# Patient Record
Sex: Female | Born: 1950
Health system: Southern US, Community
[De-identification: ages and names within clinical notes are randomized; demographics above are authoritative.]

## PROBLEM LIST (undated history)

## (undated) DIAGNOSIS — Z803 Family history of malignant neoplasm of breast: Secondary | ICD-10-CM

## (undated) DIAGNOSIS — A809 Acute poliomyelitis, unspecified: Secondary | ICD-10-CM

## (undated) DIAGNOSIS — I10 Essential (primary) hypertension: Secondary | ICD-10-CM

## (undated) DIAGNOSIS — C541 Malignant neoplasm of endometrium: Secondary | ICD-10-CM

## (undated) DIAGNOSIS — M1990 Unspecified osteoarthritis, unspecified site: Secondary | ICD-10-CM

## (undated) DIAGNOSIS — K219 Gastro-esophageal reflux disease without esophagitis: Secondary | ICD-10-CM

## (undated) DIAGNOSIS — G5601 Carpal tunnel syndrome, right upper limb: Secondary | ICD-10-CM

## (undated) DIAGNOSIS — Z800 Family history of malignant neoplasm of digestive organs: Secondary | ICD-10-CM

## (undated) DIAGNOSIS — N950 Postmenopausal bleeding: Secondary | ICD-10-CM

## (undated) DIAGNOSIS — E785 Hyperlipidemia, unspecified: Secondary | ICD-10-CM

## (undated) DIAGNOSIS — Z8051 Family history of malignant neoplasm of kidney: Secondary | ICD-10-CM

## (undated) HISTORY — PX: OTHER SURGICAL HISTORY: SHX169

## (undated) HISTORY — DX: Family history of malignant neoplasm of kidney: Z80.51

## (undated) HISTORY — PX: TOTAL HIP ARTHROPLASTY: SHX124

## (undated) HISTORY — DX: Family history of malignant neoplasm of digestive organs: Z80.0

## (undated) HISTORY — DX: Family history of malignant neoplasm of breast: Z80.3

## (undated) HISTORY — PX: COLONOSCOPY: SHX174

## (undated) HISTORY — PX: BREAST EXCISIONAL BIOPSY: SUR124

## (undated) HISTORY — DX: Acute poliomyelitis, unspecified: A80.9

## (undated) HISTORY — PX: BREAST SURGERY: SHX581

## (undated) HISTORY — PX: COLON SURGERY: SHX602

## (undated) HISTORY — PX: CARPAL TUNNEL RELEASE: SHX101

## (undated) HISTORY — DX: Hyperlipidemia, unspecified: E78.5

## (undated) HISTORY — PX: BREAST BIOPSY: SHX20

## (undated) HISTORY — DX: Gastro-esophageal reflux disease without esophagitis: K21.9

## (undated) HISTORY — DX: Essential (primary) hypertension: I10

---

## 1999-01-29 ENCOUNTER — Encounter: Payer: Self-pay | Admitting: Obstetrics and Gynecology

## 1999-01-29 ENCOUNTER — Encounter: Admission: RE | Admit: 1999-01-29 | Discharge: 1999-01-29 | Payer: Self-pay | Admitting: Obstetrics and Gynecology

## 1999-09-02 ENCOUNTER — Other Ambulatory Visit: Admission: RE | Admit: 1999-09-02 | Discharge: 1999-09-02 | Payer: Self-pay | Admitting: Obstetrics and Gynecology

## 1999-09-29 ENCOUNTER — Encounter: Payer: Self-pay | Admitting: Obstetrics and Gynecology

## 1999-09-29 ENCOUNTER — Encounter: Admission: RE | Admit: 1999-09-29 | Discharge: 1999-09-29 | Payer: Self-pay | Admitting: Obstetrics and Gynecology

## 2000-12-20 ENCOUNTER — Other Ambulatory Visit: Admission: RE | Admit: 2000-12-20 | Discharge: 2000-12-20 | Payer: Self-pay | Admitting: Obstetrics and Gynecology

## 2001-04-11 ENCOUNTER — Encounter: Payer: Self-pay | Admitting: Emergency Medicine

## 2001-04-12 ENCOUNTER — Encounter: Payer: Self-pay | Admitting: General Surgery

## 2001-04-12 ENCOUNTER — Inpatient Hospital Stay (HOSPITAL_COMMUNITY): Admission: EM | Admit: 2001-04-12 | Discharge: 2001-04-19 | Payer: Self-pay | Admitting: Emergency Medicine

## 2001-04-13 ENCOUNTER — Encounter: Payer: Self-pay | Admitting: Surgery

## 2001-04-16 ENCOUNTER — Encounter: Payer: Self-pay | Admitting: Surgery

## 2001-04-27 ENCOUNTER — Ambulatory Visit (HOSPITAL_COMMUNITY): Admission: RE | Admit: 2001-04-27 | Discharge: 2001-04-27 | Payer: Self-pay | Admitting: Surgery

## 2001-04-27 ENCOUNTER — Encounter: Payer: Self-pay | Admitting: Surgery

## 2001-09-04 ENCOUNTER — Encounter: Payer: Self-pay | Admitting: Surgery

## 2001-09-04 ENCOUNTER — Ambulatory Visit (HOSPITAL_COMMUNITY): Admission: RE | Admit: 2001-09-04 | Discharge: 2001-09-04 | Payer: Self-pay | Admitting: Surgery

## 2001-09-05 ENCOUNTER — Encounter: Payer: Self-pay | Admitting: Obstetrics and Gynecology

## 2001-09-05 ENCOUNTER — Encounter: Admission: RE | Admit: 2001-09-05 | Discharge: 2001-09-05 | Payer: Self-pay

## 2002-01-03 HISTORY — PX: BREAST CYST ASPIRATION: SHX578

## 2002-01-29 ENCOUNTER — Encounter: Admission: RE | Admit: 2002-01-29 | Discharge: 2002-01-29 | Payer: Self-pay | Admitting: Obstetrics and Gynecology

## 2002-01-29 ENCOUNTER — Encounter: Payer: Self-pay | Admitting: Obstetrics and Gynecology

## 2002-02-11 ENCOUNTER — Encounter: Payer: Self-pay | Admitting: Obstetrics and Gynecology

## 2002-02-11 ENCOUNTER — Encounter: Admission: RE | Admit: 2002-02-11 | Discharge: 2002-02-11 | Payer: Self-pay | Admitting: Obstetrics and Gynecology

## 2003-08-13 ENCOUNTER — Ambulatory Visit (HOSPITAL_COMMUNITY): Admission: RE | Admit: 2003-08-13 | Discharge: 2003-08-13 | Payer: Self-pay | Admitting: Allergy

## 2003-12-02 ENCOUNTER — Other Ambulatory Visit: Admission: RE | Admit: 2003-12-02 | Discharge: 2003-12-02 | Payer: Self-pay | Admitting: Obstetrics and Gynecology

## 2004-03-24 ENCOUNTER — Encounter: Admission: RE | Admit: 2004-03-24 | Discharge: 2004-03-24 | Payer: Self-pay | Admitting: Obstetrics and Gynecology

## 2005-03-30 ENCOUNTER — Encounter: Admission: RE | Admit: 2005-03-30 | Discharge: 2005-03-30 | Payer: Self-pay | Admitting: Obstetrics and Gynecology

## 2005-05-13 ENCOUNTER — Other Ambulatory Visit: Admission: RE | Admit: 2005-05-13 | Discharge: 2005-05-13 | Payer: Self-pay | Admitting: Obstetrics & Gynecology

## 2005-10-07 ENCOUNTER — Encounter: Admission: RE | Admit: 2005-10-07 | Discharge: 2005-10-07 | Payer: Self-pay | Admitting: Obstetrics and Gynecology

## 2006-04-28 ENCOUNTER — Encounter: Admission: RE | Admit: 2006-04-28 | Discharge: 2006-04-28 | Payer: Self-pay | Admitting: Obstetrics and Gynecology

## 2006-06-02 ENCOUNTER — Other Ambulatory Visit: Admission: RE | Admit: 2006-06-02 | Discharge: 2006-06-02 | Payer: Self-pay | Admitting: Obstetrics & Gynecology

## 2006-09-28 ENCOUNTER — Encounter: Payer: Self-pay | Admitting: Pulmonary Disease

## 2007-01-04 HISTORY — PX: WRIST SURGERY: SHX841

## 2007-01-09 ENCOUNTER — Inpatient Hospital Stay (HOSPITAL_COMMUNITY): Admission: RE | Admit: 2007-01-09 | Discharge: 2007-01-12 | Payer: Self-pay | Admitting: Orthopedic Surgery

## 2007-03-12 ENCOUNTER — Encounter: Payer: Self-pay | Admitting: Pulmonary Disease

## 2007-04-30 ENCOUNTER — Encounter: Admission: RE | Admit: 2007-04-30 | Discharge: 2007-04-30 | Payer: Self-pay | Admitting: Family Medicine

## 2007-07-04 ENCOUNTER — Encounter: Admission: RE | Admit: 2007-07-04 | Discharge: 2007-07-04 | Payer: Self-pay | Admitting: Orthopedic Surgery

## 2007-07-23 ENCOUNTER — Encounter: Payer: Self-pay | Admitting: Pulmonary Disease

## 2007-10-02 ENCOUNTER — Encounter: Payer: Self-pay | Admitting: Pulmonary Disease

## 2007-11-10 ENCOUNTER — Inpatient Hospital Stay (HOSPITAL_COMMUNITY): Admission: EM | Admit: 2007-11-10 | Discharge: 2007-11-13 | Payer: Self-pay | Admitting: Emergency Medicine

## 2008-01-25 ENCOUNTER — Encounter: Payer: Self-pay | Admitting: Pulmonary Disease

## 2008-01-28 ENCOUNTER — Encounter: Admission: RE | Admit: 2008-01-28 | Discharge: 2008-01-28 | Payer: Self-pay | Admitting: Allergy

## 2008-02-13 ENCOUNTER — Ambulatory Visit: Payer: Self-pay | Admitting: Pulmonary Disease

## 2008-02-13 DIAGNOSIS — E785 Hyperlipidemia, unspecified: Secondary | ICD-10-CM | POA: Insufficient documentation

## 2008-02-13 DIAGNOSIS — I10 Essential (primary) hypertension: Secondary | ICD-10-CM | POA: Insufficient documentation

## 2008-02-13 DIAGNOSIS — J309 Allergic rhinitis, unspecified: Secondary | ICD-10-CM | POA: Insufficient documentation

## 2008-02-13 DIAGNOSIS — J4540 Moderate persistent asthma, uncomplicated: Secondary | ICD-10-CM | POA: Insufficient documentation

## 2008-02-14 ENCOUNTER — Telehealth (INDEPENDENT_AMBULATORY_CARE_PROVIDER_SITE_OTHER): Payer: Self-pay | Admitting: *Deleted

## 2008-02-26 ENCOUNTER — Telehealth: Payer: Self-pay | Admitting: Pulmonary Disease

## 2008-03-06 ENCOUNTER — Telehealth (INDEPENDENT_AMBULATORY_CARE_PROVIDER_SITE_OTHER): Payer: Self-pay | Admitting: *Deleted

## 2008-03-17 ENCOUNTER — Telehealth (INDEPENDENT_AMBULATORY_CARE_PROVIDER_SITE_OTHER): Payer: Self-pay | Admitting: *Deleted

## 2008-03-21 ENCOUNTER — Ambulatory Visit: Payer: Self-pay | Admitting: Pulmonary Disease

## 2008-03-21 DIAGNOSIS — K219 Gastro-esophageal reflux disease without esophagitis: Secondary | ICD-10-CM | POA: Insufficient documentation

## 2008-03-25 ENCOUNTER — Ambulatory Visit: Payer: Self-pay | Admitting: Cardiovascular Disease

## 2008-04-01 ENCOUNTER — Telehealth (INDEPENDENT_AMBULATORY_CARE_PROVIDER_SITE_OTHER): Payer: Self-pay | Admitting: *Deleted

## 2008-04-07 ENCOUNTER — Telehealth (INDEPENDENT_AMBULATORY_CARE_PROVIDER_SITE_OTHER): Payer: Self-pay | Admitting: *Deleted

## 2008-06-30 ENCOUNTER — Telehealth (INDEPENDENT_AMBULATORY_CARE_PROVIDER_SITE_OTHER): Payer: Self-pay | Admitting: *Deleted

## 2008-08-04 ENCOUNTER — Ambulatory Visit: Payer: Self-pay | Admitting: Gastroenterology

## 2008-08-04 ENCOUNTER — Telehealth (INDEPENDENT_AMBULATORY_CARE_PROVIDER_SITE_OTHER): Payer: Self-pay | Admitting: *Deleted

## 2008-08-06 ENCOUNTER — Telehealth: Payer: Self-pay | Admitting: Gastroenterology

## 2008-08-07 ENCOUNTER — Ambulatory Visit: Payer: Self-pay | Admitting: Pulmonary Disease

## 2008-08-07 DIAGNOSIS — R05 Cough: Secondary | ICD-10-CM

## 2008-08-07 DIAGNOSIS — R059 Cough, unspecified: Secondary | ICD-10-CM | POA: Insufficient documentation

## 2008-08-12 ENCOUNTER — Ambulatory Visit: Payer: Self-pay | Admitting: Gastroenterology

## 2008-08-15 ENCOUNTER — Telehealth: Payer: Self-pay | Admitting: Gastroenterology

## 2008-08-22 ENCOUNTER — Telehealth: Payer: Self-pay | Admitting: Gastroenterology

## 2008-09-02 ENCOUNTER — Telehealth (INDEPENDENT_AMBULATORY_CARE_PROVIDER_SITE_OTHER): Payer: Self-pay | Admitting: *Deleted

## 2008-09-03 ENCOUNTER — Encounter: Payer: Self-pay | Admitting: Gastroenterology

## 2008-09-03 ENCOUNTER — Encounter: Payer: Self-pay | Admitting: Pulmonary Disease

## 2008-09-09 ENCOUNTER — Ambulatory Visit: Payer: Self-pay | Admitting: Gastroenterology

## 2008-09-11 ENCOUNTER — Telehealth (INDEPENDENT_AMBULATORY_CARE_PROVIDER_SITE_OTHER): Payer: Self-pay | Admitting: *Deleted

## 2008-09-18 ENCOUNTER — Telehealth (INDEPENDENT_AMBULATORY_CARE_PROVIDER_SITE_OTHER): Payer: Self-pay | Admitting: *Deleted

## 2008-09-19 ENCOUNTER — Telehealth: Payer: Self-pay | Admitting: Gastroenterology

## 2008-09-23 ENCOUNTER — Telehealth: Payer: Self-pay | Admitting: Pulmonary Disease

## 2008-09-29 ENCOUNTER — Telehealth: Payer: Self-pay | Admitting: Gastroenterology

## 2008-10-07 ENCOUNTER — Ambulatory Visit: Payer: Self-pay | Admitting: Gastroenterology

## 2008-10-15 ENCOUNTER — Ambulatory Visit (HOSPITAL_COMMUNITY): Admission: RE | Admit: 2008-10-15 | Discharge: 2008-10-15 | Payer: Self-pay | Admitting: Gastroenterology

## 2008-10-24 ENCOUNTER — Telehealth: Payer: Self-pay | Admitting: Gastroenterology

## 2008-11-12 ENCOUNTER — Telehealth: Payer: Self-pay | Admitting: Gastroenterology

## 2008-12-17 ENCOUNTER — Ambulatory Visit: Payer: Self-pay | Admitting: Gastroenterology

## 2008-12-24 ENCOUNTER — Telehealth: Payer: Self-pay | Admitting: Gastroenterology

## 2009-01-07 ENCOUNTER — Telehealth (INDEPENDENT_AMBULATORY_CARE_PROVIDER_SITE_OTHER): Payer: Self-pay | Admitting: *Deleted

## 2009-03-31 ENCOUNTER — Encounter: Admission: RE | Admit: 2009-03-31 | Discharge: 2009-03-31 | Payer: Self-pay | Admitting: Orthopedic Surgery

## 2009-05-08 ENCOUNTER — Encounter: Payer: Self-pay | Admitting: Pulmonary Disease

## 2009-12-18 ENCOUNTER — Telehealth: Payer: Self-pay | Admitting: Gastroenterology

## 2010-01-23 ENCOUNTER — Encounter: Payer: Self-pay | Admitting: Obstetrics and Gynecology

## 2010-02-04 NOTE — Progress Notes (Signed)
Summary: Record request Gabrielle Dare, L.L.P.  Request for records received from [facility], L.L.P. Request forwarded to [facility]. Dena Chavis  January 07, 2009 3:45 PM

## 2010-02-04 NOTE — Letter (Signed)
Summary: [facility]  [facility]   Imported By: Sherian Rein 06/03/2009 10:37:12  _____________________________________________________________________  External Attachment:    Type:   Image     Comment:   External Document

## 2010-02-04 NOTE — Progress Notes (Signed)
Summary: Aciphex rx  Phone Note Refill Request Call back at Home Phone [phone] Message from:  Fax from Pharmacy on December 18, 2009 1:00 PM  Refills Requested: Medication #1:  ACIPHEX 20 MG  TBEC take one by mouth two times a day   Dosage confirmed as above?Dosage Confirmed   Brand Name Necessary? No   Supply Requested: 6 months  Method Requested: Fax to Local Pharmacy Initial call taken by: Merri Ray CMA Duncan Dull),  December 18, 2009 1:01 PM

## 2010-05-18 NOTE — H&P (Signed)
[name], [name]                 ACCOUNT NO.:  [id number]   MEDICAL RECORD NO.:  [id number]          PATIENT TYPE:  INP   LOCATION:  NA                           FACILITY:  [facility]   PHYSICIAN:  Madlyn Frankel. Charlann Boxer, M.D.  DATE OF BIRTH:  05/31/1950   DATE OF ADMISSION:  01/09/2007  DATE OF DISCHARGE:                              HISTORY & PHYSICAL   PROCEDURE:  Right total hip arthroplasty.   CHIEF COMPLAINTS:  Right hip and groin pain.   HISTORY OF PRESENT ILLNESS:  Ms. [name] is a very pleasant 60 year old  female with a history of right hip pain secondary to osteoarthritis with  a significant history of post polio syndrome affecting her left lower  extremity.  She also does have a history of a left ankle fusion and  wears a brace on her left lower extremity.  She has diminished muscle  tone of her left lower extremity as well.  Her right hip has been  refractory to all conservative treatment.  She has been presurgically  assessed and cleared for surgery for right hip.   PAST MEDICAL HISTORY:  Significant for.  1. Osteoarthritis.  2. Post polio syndrome.  3. Hypertension.  4. Asthma.  5. Allergies.   PAST SURGICAL HISTORY:  1. Includes post polio leg arrest in her left and right legs.  2. Ankle fusion of her left ankle in 1960, 1963 and 1966.  3. C-section.  4. Lower abdominal surgery due to scar tissue to colon in April 2004.  5. Bunion removed right foot January 2005.  6. Two cysts removed from her vocal cord in 1978.   FAMILY HISTORY:  Hypertension, cancer, arthritis.   SOCIAL HISTORY:  She is married.  Primary caregiver will be her husband,  Rocky Link, after surgery.   DRUG ALLERGIES:  NO KNOWN DRUG ALLERGIES.   MEDICATIONS:  1. Lisinopril.  2. Advair.  3. Clarinex.  4. Singulair.  5. Flonase, verify dosage, amount and frequency with the patient upon      admission.   REVIEW OF SYSTEMS:  See HPI.   PHYSICAL EXAMINATION:  Pulse 72, respirations 18, blood pressure  120/84.  GENERAL:  Awake, alert and oriented, well-developed, well-nourished, in  no acute distress.  NECK:  Supple.  No carotid bruits.  No JVD.  CHEST:  Lungs are clear to auscultation bilaterally.  BREASTS:  Deferred.  HEART:  Regular rate and rhythm without murmurs.  ABDOMEN:  Soft, nontender, nondistended.  Bowel sounds present.  GENITOURINARY:  Deferred.  EXTREMITIES:  In the right lower extremity she has a positive dorsalis  pedis pulse positive.  She has increased pain with hip range of motion  both internal and external.  SKIN:  She has intact distal sensibilities and no signs cellulitis.  NEUROLOGIC:  Intact distal sensibilities.   Labs, EKG, and chest x-ray all pending presurgical testing.   IMPRESSION:  1. Right hip osteoarthritis.  2. Post polio syndrome.  3. Hypertension.  4. Asthma.  5. Allergies.   PLAN OF ACTION:  A right total hip arthroplasty on January 09, 2007 at  [facility] by surgeon Dr. Durene Romans.  Risks and  complications were discussed.  Questions were encouraged, answered and  reviewed.   Postoperative medications including Lovenox, Robaxin, iron, aspirin,  Colace, MiraLax provided at time of history and physical.  Postoperative  pain medications will be provided at the time of surgery.   The patient does have some concern due to the fact that her left side is  very weak and has no muscle tone in her ability to ambulate.  Did  discuss the use of a rolling walker afterwards and her weightbearing  status been weightbearing as tolerated provided no contraindicative  findings during surgery.     ______________________________  [name] [address], [place]      Madlyn Frankel. Charlann Boxer, M.D.  Electronically Signed    BLM/MEDQ  D:  01/05/2007  T:  01/05/2007  Job:  [id number]   cc:   Otilio Connors. Gerri Spore, M.D.  Fax: [phone]

## 2010-05-18 NOTE — Op Note (Signed)
NAMEKENZLY, ROGOFF NO.:  [id number]   MEDICAL RECORD NO.:  [id number]          PATIENT TYPE:  INP   LOCATION:  1602                         FACILITY:  [facility]   PHYSICIAN:  Desiree Done, [name]  DATE OF BIRTH:  May 13, 1950   DATE OF PROCEDURE:  11/11/2007  DATE OF DISCHARGE:                               OPERATIVE REPORT   PREOPERATIVE DIAGNOSIS:  Left wrist comminuted intra-articular distal  radius fracture of 4 or more fragments.  Left distal ulna fracture, metaphyseal fracture.   POSTOPERATIVE DIAGNOSIS:  Left wrist comminuted intra-articular distal  radius fracture of 4 or more fragments.  Left distal ulna fracture, metaphyseal fracture.   SURGEON:  Desiree Done, [name] who was scrubbed and present for the  entire procedure.   ASSISTANT SURGEON:  None.   SURGICAL PROCEDURES:  1. Open treatment of left wrist intra-articular distal radius fracture      of 4 or more fragments with internal fixation.  2. Left wrist brachial radialis tenotomy.  3. Radiograph stress radiograph 3 views left wrist.  4. Closed treatment of left distal ulna shaft fracture with associated      ulnar styloid fracture.   SURGICAL IMPLANTS:  Synthes volar distal radius plate with 5 mL of Ortho-  Glass bone graft substitute.  One 0.045 K-wire.   ANESTHESIA:  General via LMA.   TOURNIQUET TIME:  Approximately 1 hour at 250 mmHg.   SURGICAL INDICATIONS:  [name] is a 60 year old right-hand dominant  female who sustained a fall late in the evening of November 10, 2007.  She was seen and evaluated in the emergency department secondary to  displaced distal radius and ulna fracture.  She underwent closed  manipulation.  The closed manipulation resulted in improvement of the  overall alignment of the wrist but there was still displacement noted.  Due to a high degree of comminution and injury it was recommended that  she undergo the above procedure.  Risks, benefits and  alternatives were  discussed in detail with the patient whose signed informed consent was  obtained on the day of surgery.   DESCRIPTION OF PROCEDURE:  The patient was preoperatively identified in  the preop holding area and a marker made on the left wrist to indicate  the correct operative site.  The patient was then brought back to the  operating room and placed supine on the anesthesia table where general  anesthesia was administered via LMA.  The patient received preoperative  antibiotics prior to any skin incisions.  A well-padded tourniquet was  then placed on the left brachium and sealed with the 1000 drape.  Time-  out was called.  The correct site was identified.  The procedure was  then begun.  The limb was then elevated and using Esmarch exsanguination  the tourniquet insufflated to 250 mmHg.  A longitudinal incision was  then made directly over the FCR tendon sheath.  Dissection was carried  down through the subcutaneous tissues.  The FCR was identified.  The FCR  sheath was then opened both proximally and distally.  Going through the  floor of the FCR sheath the FCR sheath was opened and the FPL was  identified.  The pronator quadratus what was left of it the distal  portion, the proximal portion was then elevated off the radius.  The  proximal portion was not in continuity.  Following this this allowed for  fracture site exposure.  In order to obtain adequate exposure the  brachial radialis had to be carefully elevated and released off its  insertion on the radial styloid.  This was [name] and then once released  from the brachial radialis the fracture site could be openly reduced.  It was reduced and then held temporarily in place with a 0.045 K-wire.  After the K-wire was placed the position was then confirmed using the  mini C-arm.  The patient did have a large metaphyseal void.  With the  void in place the 5 mL of the Ortho-Glass bone graft substitute was then  packed  into the void.  There were still cortical pieces that were  allowed to then be placed back over the volar cortical margin.  After  bone grafting the volar distal radius plate of Synthes was then picked  out.  Given the metaphyseal comminution a single row plate was then  applied with four screws distally and three screws proximally.  The  oblong hole was then placed proximally and the plate adjusted to the  appropriate height and position.  After this the four distal locking  screws were then placed, locking pegs.  Attention was then turned  proximally where two more bicortical screws were then placed, one  locking and one nonlocking for the final implants.  The wound was then  thoroughly irrigated.  The K-wire was left in place, it was cut and  bent.  Final images were then obtained.  Stress radiographs of the wrist  were then obtained to confirm good position of the fracture site and  implant.  After fixation of the radius the distal radial ulnar joint was  then assessed and there did not appear to be instability and fortunately  the ulna lined up very nicely and it was felt not necessary to plate the  ulna given its near anatomical alignment after fixation of the radius.  The wound was then copiously irrigated once again.  What was left of the  pronator quadratus was then repaired with 2-0 Vicryl suture.  The  tourniquet was then deflated.  Hemostasis was obtained with bipolar  cautery and direct pressure and saline irrigation.  The subcutaneous  tissues were then closed with 4-0 Vicryl and the skin closed with a 4-0  nylon horizontal mattress suture.  Marcaine 0.25% 18 mL was infiltrated  in the wound.  The patient tolerated this well.  Xeroform dressing was  then applied around the pin sites and around the wound.  The patient was  placed in a sterile compressive dressing.  The patient was then placed  in a well-padded sugar-tong splint.  The patient was then extubated and  taken to  the recovery room in good condition.   POSTOPERATIVE PLAN:  The patient will be admitted overnight for IV  antibiotics and pain control but she will be discharged once her pain is  controlled and she is up ambulating well.  She will be seen back in  approximately 10 days for x-rays out of the splint.  Total of 3 weeks  long arm immobilization for the ulna fracture and then transition to  short  arm casting, being likely motion and activity around the 5 week  mark depending on the radiographs.  Radiographs at the 10 day mark, 3  week mark and then the 5 week mark.      Desiree Done, [name]  Electronically Signed     FWO/MEDQ  D:  11/11/2007  T:  11/11/2007  Job:  [id number]

## 2010-05-18 NOTE — Op Note (Signed)
[name], [name]                 ACCOUNT NO.:  [id number]   MEDICAL RECORD NO.:  [id number]          PATIENT TYPE:  INP   LOCATION:  0007                         [facility]AC[facility]  [facility]   PHYSICIAN:  Madlyn Frankel. Charlann Boxer, M.D.  DATE OF BIRTH:  February 22, 1950   DATE OF PROCEDURE:  01/09/2007  DATE OF DISCHARGE:                               OPERATIVE REPORT   PREOPERATIVE DIAGNOSIS:  Right hip osteoarthritis.   POSTOPERATIVE DIAGNOSIS:  Right hip osteoarthritis.   PROCEDURE:  Right total hip replacement.   COMPONENTS USED:  DePuy hip system with a size 46 SR ball, 3 high offset  trial lock stem with a 41+ 2 ASR ball and adapter.   SURGEON:  Madlyn Frankel. Charlann Boxer, M.D.   ASSISTANT:  Dwyane Luo, PA   ANESTHESIA:  Spinal.   BLOOD LOSS:  200 mL.   DRAINS:  Times one.   COMPLICATIONS:  None.   INDICATIONS FOR PROCEDURE:  [name] is a very pleasant 60 year old  female who presented to the office for evaluation of right hip pain.  She had a history of polio affecting the left lower extremity with  concurrent existing post polio syndrome.  Her right groin pain has  persisted and decreases her functional ability she had concerns about  her right unaffected lower extremity, the good lower extremity, having  this much pain and causing her difficulty to get around.  After  discussing conservative options, she did not want to go through  temporizing processes and wished to proceed  with hip replacement  surgery.  We reviewed the risks of infection, DVT, component failure,  dislocation, and discussed the bearing surface in this 60 year old  active female.  Consent was obtained.   PROCEDURE IN DETAIL:  The patient was brought to operative theater.  Once adequate anesthesia, preoperative antibiotics, Ancef, administered,  the patient was positioned in the left lateral decubitus position with  the right side up.  The right lower extremity was then pre-scrubbed and  prepped and draped in sterile fashion.   The lateral based incision was  made for posterior approach to the hip.  The iliotibial band and gluteus  fascia were incised posteriorly.  The short external rotators were  identified and taken down separate from the posterior capsule.  An L  capsulotomy was created posteriorly preserving the posterior leaflet for  later anatomic repair as well as protection of the sciatic nerve from  retractors.  Femoral head was dislocated.  Based off the anatomic  landmarks and preoperative templating, the neck osteotomy was made.  Attention was first directed to the femur.  A starting drill was used  followed by hand reamer then irrigating the canal prevent to prevent fat  emboli.  I then began broaching with zero broach setting my anteversion  near her native anteversion which was at about 25 degrees.   I went ahead and broached up to a size 3 which sat level with my neck  cut.  Given this, I went ahead and packed the canal off with a sponge  and then attended to the acetabulum.  Acetabular exposure  was obtained,  identifying the fact that there was a very small acetabulum.  There was  some labral tissue debrided.  At this point I began reaming with the 44  reamer and then to a 45 with a curved reamer.  This prepared the bony  bed as good as I would want and preserved the anterior and posterior  bone.  Given the options of the ASR component system, I chose to use a  46 ASR cup.  This was subsequently impacted at about 35 degrees of  abduction on the table, hopefully amounting to 40-45 degrees when she  sits, lying or standing.   Anatomically it was well covered.  At this point trial reduction was  carried out with 3 high offset neck and +2 adapter.  The hip was reduced  and found to be very stable throughout a range of motion with only a  millimeter of shuck in extension.  Leg lengths were in a relevant  situation at this point due to significant shortening on the left lower  extremity.  I  do not  feel that she was lengthened during this case.  At  this point the trial femur was removed and the final size 3 high offset  Trilock stem was impacted at the level where the broach sat.  I then,  based on this, chose to use the 41 ball with +2 adapter.  Hip was  reduced.  Throughout the case the hip was irrigated and again at this  point.  The posterior leaflet of the capsule was reapproximated to  superior leaflet using #1 Ethibond.  The remainder of wound was closed  in layers with #1 Ethibond on the iliotibial band and a running #1  Vicryl in the gluteal fascia, 2-0 subcutaneous Vicryl was used followed  by running 4-0 Monocryl.  Hip was then cleaned, dried and dressed  sterilely with Steri-Strips and a sterile dressing.  She was brought to  recovery room in stable condition.      Madlyn Frankel Charlann Boxer, M.D.  Electronically Signed     MDO/MEDQ  D:  01/09/2007  T:  01/09/2007  Job:  [id number]

## 2010-05-21 NOTE — Op Note (Signed)
[facility]  Patient:    [name], [name] Visit Number: [id number] MRN: [id number]          Service Type: MED Location: 3W [id number] 02 Attending Physician:  Caleen Essex Dictated by:   Currie Paris, M.D. Proc. Date: 04/13/01 Admit Date:  04/11/2001                             Operative Report  Visit Number:  [id number]  PREOPERATIVE DIAGNOSIS:  Small-bowel obstruction.  POSTOPERATIVE DIAGNOSIS:  Small-bowel obstruction -- closed loop secondary to adhesion; question cystic lesion left lobe of liver.  OPERATION/PROCEDURE:  Exploratory laparotomy and lysis of adhesions.  SURGEON:  Currie Paris, M.D.  ASSISTANT:  Gita Kudo, M.D.  ANESTHESIA:  General endotracheal.  CLINICAL HISTORY:  This patient is a 60 year old who presents with a small-bowel obstruction which has not improved with overnight NG suction, and x-rays this morning actually looked worse.  After discussion with the patient, she was taken to the operating room for exploration.  DESCRIPTION OF PROCEDURE:  The patient was brought to the operating room and after satisfactory general endotracheal anesthesia was obtained the abdomen was prepped and draped and a Foley catheter placed.  A midline incision from below the umbilicus to just above her old Pfannenstiel incision was made and the peritoneal cavity entered in the midline.  Serous peritoneal fluid was present.  A markedly dilated reddened loop of bowel was noted.  I started to manipulate this out and in doing so could see that the distal bowel was empty and the proximal bowel was somewhat dilated.  Unfortunately because anesthesia wished not to give her any relaxant, I did not feel that I had good exposure to get this out so I made the incision a little bit longer and above the umbilicus.  In doing so I was able to manipulate a little more of this beefy red small-bowel out and when was able to palpate a single  band obstructing it and divide that with the cautery.  This freed up the small-bowel and we were able to manipulate it out into the abdomen.  Some of the peritoneum looked a little bit dusky, and there was about a 4-cm section that was very dusky initially.  The rest of the bowel was simply beefy red. There appeared to be no distal obstruction, and no clear evidence that there was anything dead.  I put this bowel back in the abdomen for approximately 5 minutes and just left it covered, brought it back out and reinspected it, and it appeared to be improving, but I was still concerned about this 4 cm section.  I left it for two additional 5-minute segments; and at the end of the second one there was clearly marked improvement with a lot of the dark coloration changing to pink especially along the antemesenteric border.  I was able to palpate easily pulses out to the periphery of the mesentery right up to this area in question; and on thumping (the bowel) did see peristalsis here. At this point, I thought that this was viable bowel and probably just had some venous congestion that was improving with time.  I, therefore, elected not to do any resection and replace this bowel.  Then, I put my hand in to try to feel the area of the gallbladder which was normal, but in feeling around the liver there was a cystic area that seemed to  be on the edge of the left lobe of the liver.  I could not visualize it with this incision and because of her history of polio, and the fact that we were not going to have adequate relaxation elected not to explore this further, at this point in time, but to follow this up with review of her CT scan and possibly either a second CT scan or an ultrasound of the area, feeling that it was best simply to get her over this bowel obstruction at the present time.  The omentum was pulled down so that we had some omental coverage, although the patient is thin and this is not  a thick layer her.  The abdomen was then closed with running #1 PDS fascial sutures.  Subcu was closed with staples. The patient tolerated the procedure well.  There was essentially no blood loss.  All counts were correct. Dictated by:   Currie Paris, M.D. Attending Physician:  Caleen Essex DD:  04/13/01 TD:  04/15/01 Job: [id number]

## 2010-05-21 NOTE — Discharge Summary (Signed)
[name], [name]                 ACCOUNT NO.:  [id number]   MEDICAL RECORD NO.:  [id number]          PATIENT TYPE:  INP   LOCATION:  1602                         FACILITY:  [facility]   PHYSICIAN:  Madelynn Done, MD  DATE OF BIRTH:  June 08, 1950   DATE OF ADMISSION:  11/09/2007  DATE OF DISCHARGE:  11/13/2007                               DISCHARGE SUMMARY   ADMISSION DIAGNOSES:  Left distal radius fracture and fall.   DISCHARGE DIAGNOSES:  Same.   REASON FOR ADMISSION:  The patient fell and sustained a closed left  intra-articular distal radius fracture and distal ulna fracture.  She  was admitted to undergo operative intervention for the displaced  fracture.   DISCHARGE MEDICATIONS:  1. Percocet 5/325 one to two tablets every 4 - 6 hours as needed for      pain.  2. Colace 100 mg p.o. b.i.d.  3. Robaxin 500 mg p.o. q.6 hours p.r.n. spasm.   PROCEDURES AND DATES:  Left wrist open reduction internal fixation on  November 11, 2007.   HOSPITAL COURSE:  The patient was admitted to the orthopedic service on  the night of admission after she underwent a closed manipulation of the  distal radius.  Post reduction radiographs did show continued  displacement and it was recommended that she be admitted to undergo the  above procedure.  The patient tolerated the above procedure under  general anesthesia.  Postoperatively she was admitted for pain control  and IV antibiotics as well as gait training.  She did well  postoperatively, pain was controlled on oral and IV pain medications.  She did have some postoperative nausea and vomiting and it did slow her  return to home.  The patient was seen and examined on November 13, 2007,  she was afebrile, her vital signs were stable and normal.  She was  tolerating a regular diet and felt ready to be discharged to home.   RECOMMENDATIONS AND DISPOSITION:  The patient be discharged to home.  She will be seen back in the office in 10 days for wound  check and  suture removal and then she is continued with the above discharge  medications.  She is going to contact the office if she has any further  problems.  Prior to her discharge, she is to keep her hand elevated, no  use of the left upper extremity prior to her discharge.  The patient's  discharge instructions were explained to her, the questions were  answered and the patient voiced understanding of discharge instructions.   CONDITION ON DISCHARGE:  Good.      Madelynn Done, MD  Electronically Signed     FWO/MEDQ  D:  12/06/2007  T:  12/07/2007  Job:  [id number]

## 2010-05-21 NOTE — Discharge Summary (Signed)
[facility]  Patient:    [name], [name] Visit Number: [id number] MRN: [id number]          Service Type: MED Location: 3W [id number] Attending Physician:  Caleen Essex Dictated by:   Currie Paris, M.D. Admit Date:  04/11/2001 Discharge Date: 04/19/2001                             Discharge Summary  DISCHARGE DIAGNOSES: 1. Small bowel obstruction secondary to adhesions. 2. History of polio. 3. Volume depletion secondary to #1.  HISTORY OF PRESENT ILLNESS:  [name] is a 60 year old lady who was admitted on April 10, with what appeared to be an early partial small bowel obstruction.  She has also appeared somewhat IV depleted and was started on IV fluids and NG suction.  The next day, she felt no better, in fact, somewhat worse and x-rays showed increasing small bowel obstruction.  HOSPITAL COURSE:  She was taken to the operating room on April 11, and exploratory laparotomy with lysis of adhesions done and a closed loop small bowel obstruction was found, but all the bowel was viable.  Postoperatively, she had a relatively benign course.  Her NG was left in until postop day #4 when it was taken out and she was begun on sips.  She was advanced to full liquids the next day and solids on the next day.  She was ready to be discharged on that day.  She was ambulating and had been afebrile with stable vital signs.  Her abdomen is benign and wound was healing without infection.  Postoperative CBCs and BMETs had been normal.  CONDITION ON DISCHARGE:  The patient is discharged on April 17, in satisfactory condition.  DIET:  Regular.  ACTIVITY:  Limited.  FOLLOWUP:  Follow up in Dr. Weldon Inches office in about four days to get the remaining staples out.  DISCHARGE MEDICATION:  Tylox. Dictated by:   Currie Paris, M.D. Attending Physician:  Caleen Essex DD:  04/19/01 TD:  04/20/01 Job: [id number]

## 2010-05-21 NOTE — Discharge Summary (Signed)
NAMEANINA, SCHNAKE                 ACCOUNT NO.:  [id number]   MEDICAL RECORD NO.:  [id number]          PATIENT TYPE:  INP   LOCATION:  1606                         FACILITY:  [facility]   PHYSICIAN:  Madlyn Frankel. Charlann Boxer, M.D.  DATE OF BIRTH:  December 04, 1950   DATE OF ADMISSION:  01/09/2007  DATE OF DISCHARGE:  01/12/2007                               DISCHARGE SUMMARY   ADMITTING DIAGNOSIS:  1. Osteoarthritis.  2. Post polio syndrome.  3. Hypertension.  4. Asthma.  5. Allergies.   DISCHARGE DIAGNOSIS:  1. Osteoarthritis.  2. Post polio syndrome.  3. Hypertension.  4. Asthma.  5. Allergies.   HISTORY OF PRESENT ILLNESS:  A 60 year old female with a history of  right hip pain secondary to osteoarthritis with a significant history of  post polio syndrome affecting her left lower extremity.  The right hip  has been refractory to all conservative treatment.   CONSULTATION:  None.   PROCEDURE:  Right total hip replacement.  Surgeon Dr. Durene Romans.  Assistant Dwyane Luo. Components used were a metal on metal.   LABS:  Preadmission CBC, hematocrit was 37.3, at discharge 30.9 and  stable.  Differential normal.  INR 0.9.  Routine chemistry on admission,  no significant abnormalities. At discharge, sodium 134, potassium 3.4,  glucose 140. Kidney function normal, GFR greater than 60 at time of  discharge. Her calcium was 8.2.  UA negative.   CARDIOLOGY:  EKG sinus rhythm.   RADIOLOGY:  Portable pelvis x-ray good appearance of her right total hip  replacement.   No chest x-ray found on chart.   HOSPITAL COURSE:  The patient underwent a right total hip placement and  then to orthopedic floor.  Tolerated procedure well.  Postop day #1 she  did have some severe muscle spasms in her left lower extremity, Robaxin  plus Percocet helped to relieve the spasms and pain.  We held her Foley  for one additional day. She was afebrile throughout.  Hemodynamically  she remained stable.  The dressing was  changed on a daily basis after  postop day #1 when the Hemovac was pulled intact.  She was  neurovascularly intact to her right lower extremity throughout.  She  made good progress, weightbearing as tolerated.  Did use a brace on her  left lower extremity to maintain balance.  Day 2 progressing nicely,  doing well, no complaints of muscle spasms. Continue to wear the knee  immobilizer on the left lower extremity. By the third day, she was able  to ambulate at least 50 feet with the use of a rolling walker and was  ready for discharge home.   DISCHARGE DISPOSITION:  Discharged home with home health care PT and  Lovenox administration.   DISCHARGE MEDICATIONS:  1. Lovenox 40 mg subcu q. 24  x11 days.  2. Robaxin 500 mg p.o. q.6 muscle spasm.  3. Iron 325 mg 1 p.o. t.i.d. x3 weeks.  4. Enteric-coated aspirin 325 mg 1 p.o. daily x4 weeks after Lovenox      completed.  5. Colace 100  mg p.o. b.i.d.  6. MiraLax 17 grams p.o. daily.  7. Percocet 5/325 one to two p.o. q. 4-6  p.r.n. pain.  8. Lisinopril 12.5 mg one p.o. q.a.m.  9. Flonase 50 mcg 2 puffs each nostril q.h.s.  10.Astelin 2 puffs q.a.m.  11.Advair 250/50 1 puff inhaler twice daily.  12.Singulair 10 mg one p.o. q.a.m.  13.Zantac 300 mg p.o. b.i.d.  14.Multivitamin p.o. daily.  15.B complex p.o. daily.  16.Vitamin D 1000 units p.o. daily.   DISCHARGE DIET:  Regular.   DISCHARGE WOUND CARE:  Keep wound dry.   DISCHARGE PHYSICAL THERAPY:  Weightbearing as tolerated with the use of  a rolling walker with transition to single-point cane in 2 weeks.   DISCHARGE FOLLOWUP:  Follow up with Dr. Charlann Boxer at phone number [phone] in  10 days.     ______________________________  Yetta Glassman [address], [place]      Madlyn Frankel. Charlann Boxer, M.D.  Electronically Signed    BLM/MEDQ  D:  01/29/2007  T:  01/29/2007  Job:  [id number]   cc:   Otilio Connors. Gerri Spore, M.D.  Fax: [phone]

## 2010-06-23 ENCOUNTER — Other Ambulatory Visit: Payer: Self-pay | Admitting: Gastroenterology

## 2010-06-23 MED ORDER — RABEPRAZOLE SODIUM 20 MG PO TBEC: 20.0000 mg | DELAYED_RELEASE_TABLET | Freq: Two times a day (BID) | ORAL | Status: DC

## 2010-06-23 NOTE — Telephone Encounter (Signed)
Called pt to inform Aciphex sent in to pharmacy, also informed her she needed to schedule an office appointment by the end of this year for a follow up office visit

## 2010-08-27 ENCOUNTER — Other Ambulatory Visit: Payer: Self-pay | Admitting: Family Medicine

## 2010-08-27 DIAGNOSIS — Z1231 Encounter for screening mammogram for malignant neoplasm of breast: Secondary | ICD-10-CM

## 2010-09-02 ENCOUNTER — Ambulatory Visit
Admission: RE | Admit: 2010-09-02 | Discharge: 2010-09-02 | Disposition: A | Discharge status: Home / Self Care | Payer: BC Managed Care – PPO | Source: Ambulatory Visit | Attending: Family Medicine | Admitting: Family Medicine

## 2010-09-02 DIAGNOSIS — Z1231 Encounter for screening mammogram for malignant neoplasm of breast: Secondary | ICD-10-CM

## 2010-09-03 ENCOUNTER — Ambulatory Visit: Payer: Self-pay

## 2010-09-22 LAB — TYPE AND SCREEN
ABO/RH(D): A POS
Antibody Screen: NEGATIVE

## 2010-09-22 LAB — CBC
HCT: 30.9 — ABNORMAL LOW
Hemoglobin: 10.9 — ABNORMAL LOW
Platelets: 245
Platelets: 258
RDW: 13.1
WBC: 8.5

## 2010-09-22 LAB — BASIC METABOLIC PANEL
BUN: 11
BUN: 7
Calcium: 8.5
Creatinine, Ser: 0.66
GFR calc non Af Amer: >60
GFR calc non Af Amer: >60
Glucose, Bld: 114 — ABNORMAL HIGH
Potassium: 3.4 — ABNORMAL LOW
Sodium: 134 — ABNORMAL LOW

## 2010-10-05 LAB — BASIC METABOLIC PANEL
BUN: 9
CO2: 26
Chloride: 103
Chloride: 105
GFR calc Af Amer: >60
Glucose, Bld: 125 — ABNORMAL HIGH
Potassium: 2.6 — CL
Potassium: 3.9
Sodium: 138
Sodium: 139

## 2010-10-05 LAB — PROTIME-INR: Prothrombin Time: 13.8

## 2010-10-05 LAB — DIFFERENTIAL
Eosinophils Absolute: 0
Eosinophils Relative: 0
Lymphs Abs: 1.6
Monocytes Absolute: 0.8
Monocytes Relative: 6

## 2010-10-05 LAB — CBC
HCT: 37.6
Hemoglobin: 12.6
MCV: 97.6
Platelets: 283
WBC: 13.7 — ABNORMAL HIGH

## 2010-10-08 LAB — CBC
HCT: 37.3
Hemoglobin: 13.1
Platelets: 320
RDW: 13.4
WBC: 8.7

## 2010-10-08 LAB — BASIC METABOLIC PANEL
Calcium: 9.4
GFR calc non Af Amer: >60
Glucose, Bld: 103 — ABNORMAL HIGH
Potassium: 3.6
Sodium: 142

## 2010-10-08 LAB — DIFFERENTIAL
Basophils Absolute: 0
Eosinophils Relative: 2
Lymphocytes Relative: 21
Lymphs Abs: 1.8
Neutro Abs: 6

## 2010-10-08 LAB — URINE MICROSCOPIC-ADD ON

## 2010-10-08 LAB — URINALYSIS, ROUTINE W REFLEX MICROSCOPIC
Glucose, UA: NEGATIVE
Hgb urine dipstick: NEGATIVE
Specific Gravity, Urine: 1.021
pH: 6

## 2010-10-08 LAB — PROTIME-INR: Prothrombin Time: 12.5

## 2010-10-08 LAB — APTT: aPTT: 32

## 2010-11-17 ENCOUNTER — Ambulatory Visit (INDEPENDENT_AMBULATORY_CARE_PROVIDER_SITE_OTHER): Payer: BC Managed Care – PPO | Admitting: Gastroenterology

## 2010-11-17 ENCOUNTER — Encounter: Payer: Self-pay | Admitting: Gastroenterology

## 2010-11-17 VITALS — BP 110/84 | HR 88 | Ht 62.0 in | Wt 144.0 lb

## 2010-11-17 DIAGNOSIS — K219 Gastro-esophageal reflux disease without esophagitis: Secondary | ICD-10-CM

## 2010-11-17 MED ORDER — RABEPRAZOLE SODIUM 20 MG PO TBEC: 20.0000 mg | DELAYED_RELEASE_TABLET | Freq: Two times a day (BID) | ORAL | Status: DC

## 2010-11-17 NOTE — Assessment & Plan Note (Signed)
Symptoms are well controlled with AcipHex. Plan to continue with the same.

## 2010-11-17 NOTE — Progress Notes (Signed)
[name] has returned for followup of her GERD. Reflux caused her chronic cough and hoarseness. On AcipHex one to 2 times a day her symptoms have significantly improved. She currently has no GI complaints.       Review of Systems: Pertinent positive and negative review of systems were noted in the above HPI section. All other review of systems were otherwise negative.    Current Medications, Allergies, Past Medical History, Past Surgical History, Family History and Social History were reviewed in [employer] electronic medical record  Vital signs were reviewed in today's medical record. Physical Exam: General: Well developed , well nourished, no acute distress Head: Normocephalic and atraumatic Eyes:  sclerae anicteric, EOMI Ears: Normal auditory acuity Mouth: No deformity or lesions Lungs: Clear throughout to auscultation Heart: Regular rate and rhythm; no murmurs, rubs or bruits Abdomen: Soft, non tender and non distended. No masses, hepatosplenomegaly or hernias noted. Normal Bowel sounds Rectal:deferred Musculoskeletal: Symmetrical with no gross deformities  Pulses:  Normal pulses noted Extremities: No clubbing, cyanosis, edema or deformities noted Neurological: Alert oriented x 4, grossly nonfocal Psychological:  Alert and cooperative. Normal mood and affect

## 2010-11-17 NOTE — Patient Instructions (Signed)
We will renew your medications today Follow up as needed 

## 2011-07-06 ENCOUNTER — Other Ambulatory Visit: Payer: Self-pay | Admitting: Gastroenterology

## 2011-08-01 ENCOUNTER — Encounter (HOSPITAL_COMMUNITY): Payer: Self-pay | Admitting: Pharmacy Technician

## 2011-08-03 ENCOUNTER — Encounter (HOSPITAL_COMMUNITY)
Admission: RE | Admit: 2011-08-03 | Discharge: 2011-08-03 | Disposition: A | Discharge status: Home / Self Care | Payer: BC Managed Care – PPO | Source: Ambulatory Visit | Attending: Orthopedic Surgery | Admitting: Orthopedic Surgery

## 2011-08-03 ENCOUNTER — Telehealth: Payer: Self-pay | Admitting: Pulmonary Disease

## 2011-08-03 ENCOUNTER — Encounter (HOSPITAL_COMMUNITY): Payer: Self-pay

## 2011-08-03 ENCOUNTER — Ambulatory Visit (HOSPITAL_COMMUNITY)
Admission: RE | Admit: 2011-08-03 | Discharge: 2011-08-03 | Disposition: A | Discharge status: Home / Self Care | Payer: BC Managed Care – PPO | Source: Ambulatory Visit | Attending: Orthopedic Surgery | Admitting: Orthopedic Surgery

## 2011-08-03 DIAGNOSIS — Z01812 Encounter for preprocedural laboratory examination: Secondary | ICD-10-CM | POA: Insufficient documentation

## 2011-08-03 DIAGNOSIS — Z01810 Encounter for preprocedural cardiovascular examination: Secondary | ICD-10-CM | POA: Insufficient documentation

## 2011-08-03 DIAGNOSIS — M6289 Other specified disorders of muscle: Secondary | ICD-10-CM | POA: Insufficient documentation

## 2011-08-03 HISTORY — DX: Unspecified osteoarthritis, unspecified site: M19.90

## 2011-08-03 LAB — URINALYSIS, ROUTINE W REFLEX MICROSCOPIC
Bilirubin Urine: NEGATIVE
Hgb urine dipstick: NEGATIVE
Ketones, ur: NEGATIVE mg/dL
Protein, ur: NEGATIVE mg/dL
Urobilinogen, UA: 0.2 mg/dL (ref 0.0–1.0)

## 2011-08-03 LAB — SURGICAL PCR SCREEN: Staphylococcus aureus: POSITIVE — AB

## 2011-08-03 LAB — BASIC METABOLIC PANEL
BUN: 12 mg/dL (ref 6–23)
Calcium: 9.5 mg/dL (ref 8.4–10.5)
GFR calc non Af Amer: >90 mL/min (ref >90)
Glucose, Bld: 93 mg/dL (ref 70–99)

## 2011-08-03 LAB — CBC
MCH: 31.6 pg (ref 26.0–34.0)
MCHC: 33.7 g/dL (ref 30.0–36.0)
Platelets: 310 10*3/uL (ref 150–400)
RDW: 14.4 % (ref 11.5–15.5)

## 2011-08-03 LAB — PROTIME-INR: Prothrombin Time: 12.9 seconds (ref 11.6–15.2)

## 2011-08-03 NOTE — Telephone Encounter (Signed)
Per [name] or PW has openings on Friday, 08/05/11-book w/ one of them in a OV slot for surgery clearance.  Made an appt w/ PW for 08/05/11 @ 3:30 pm.  Pt verbalized understanding & stated nothing further needed at this time.  [name]

## 2011-08-03 NOTE — Pre-Procedure Instructions (Signed)
Per patient- was told by Dr Tenny Craw office that they cannot give pulmonary clearance at that office. Patient stated is going to try to get appt with Dr Vassie Loll as she has seen him in the past. Asked her to have clearance faxed to Dr Charlann Boxer

## 2011-08-03 NOTE — Pre-Procedure Instructions (Signed)
Faxed abnormal BMET results to Dr Charlann Boxer with confirmation. Notified M Babish PA of abnormal potassium

## 2011-08-03 NOTE — Pre-Procedure Instructions (Signed)
Pt has appt with Dr Red Chute Callas 08/05/11 for pulmonary clearance.  Notified Chip Boer in [place] scheduling that patient desires Dr Marjo Bicker for anesthesia. Anesth record from 2009 on chart

## 2011-08-03 NOTE — Telephone Encounter (Signed)
Continued from above.  RA does not have anything prior to surgery date of 08/15/11.  I did not see another spot where pt can be worked in prior to the surgery date.  Pt stated that she actually sees Dr. Tyrone Callas for her asthma, but Dr. [place] Callas refused to issue a release & told pt to see her pulmonary doctor.  Pt requests to be worked in w/someone so she can get the surgery clearance.  Antionette Fairy

## 2011-08-03 NOTE — Patient Instructions (Addendum)
20 [name]  08/03/2011   Your procedure is scheduled on: 08/15/11   Surgery [id number]  MONDAY  Report to [facility] at 0725      AM.  Call this number if you have problems the morning of surgery: [phone]     Or PST   [phone]  [facility]   Remember:   Do not eat food: or drink any fluids After Midnight. Sunday NIGHT- night before surgery    Take these medicines the morning of surgery with A SIP OF WATER: LORATADINE, ACIPHEX                                                          ADVAIR   Do not wear jewelry, make-up or nail polish.  Do not wear lotions, powders, or perfumes. You may wear deodorant.  Do not shave 48 hours prior to surgery.  Do not bring valuables to the hospital.  Contacts, dentures or bridgework may not be worn into surgery.  Leave suitcase in the car. After surgery it may be brought to your room.  For patients admitted to the hospital, checkout time is 11:00 AM the day of discharge.   Patients discharged the day of surgery will not be allowed to drive home.  Name and phone number of your driver:     REHAB                                                                 Special Instructions: CHG Shower Use Special Wash: 1/2 bottle night before surgery and 1/2 bottle morning of surgery. REGULAR SOAP FACE AND PRIVATES              LADIES- NO SHAVING 48 HOURS BEFORE USING BETASEPT SOAP.                  Please read over the following fact sheets that you were given: MRSA Information

## 2011-08-05 ENCOUNTER — Encounter: Payer: Self-pay | Admitting: Critical Care Medicine

## 2011-08-05 ENCOUNTER — Ambulatory Visit (INDEPENDENT_AMBULATORY_CARE_PROVIDER_SITE_OTHER): Payer: BC Managed Care – PPO | Admitting: Critical Care Medicine

## 2011-08-05 VITALS — BP 110/78 | HR 94 | Temp 97.7°F | Ht 61.5 in | Wt 138.4 lb

## 2011-08-05 DIAGNOSIS — J45909 Unspecified asthma, uncomplicated: Secondary | ICD-10-CM

## 2011-08-05 MED ORDER — FLUTICASONE-SALMETEROL 250-50 MCG/DOSE IN AEPB: 1.0000 | INHALATION_SPRAY | Freq: Two times a day (BID) | RESPIRATORY_TRACT | Status: DC

## 2011-08-05 NOTE — Patient Instructions (Addendum)
You are cleared for surgery Stay on Advair twice daily but reduce to 250 strength Return as needed

## 2011-08-05 NOTE — Progress Notes (Signed)
Subjective:    Patient ID: [name], female    DOB: 02-18-1950, 61 y.o.   MRN: [id number]  HPI 08/05/2011 Dx h/o asthma for 29yrs.  Post polio syndrome: weakness and fatigue. No diaphragm weakness Since back from [place] end of 05/26/11 more congestion and more dyspnea. PUL ASTHMA HISTORY 08/05/2011  Symptoms 0-2 days/week  Nighttime awakenings 0-2/month  Interference with activity No limitations  SABA use 0-2 days/wk  Exacerbations requiring oral steroids 0-1 / year   Pt was off Advair for 1.5 yrs now is back on this x 2weeks.  Still sees Rich Callas. Now is stable and now acute issues. Pt denies any significant sore throat, nasal congestion or excess secretions, fever, chills, sweats, unintended weight loss, pleurtic or exertional chest pain, orthopnea PND, or leg swelling Pt denies any increase in rescue therapy over baseline, denies waking up needing it or having any early am or nocturnal exacerbations of coughing/wheezing/or dyspnea. Pt also denies any obvious fluctuation in symptoms with  weather or environmental change or other alleviating or aggravating factors    Past Medical History  Diagnosis Date  . Allergic rhinitis   . Hyperlipemia   . GERD (gastroesophageal reflux disease)   . Polio   . Arthritis   . Unspecified essential hypertension     clearance Dr Everardo Beals with note on chart  . Asthma      Family History  Problem Relation Age of Onset  . Kidney cancer Father   . Melanoma Mother     STAGE 3  . Breast cancer Mother   . Colon cancer Maternal Grandfather   . Breast cancer Maternal Grandmother     meta to stomach     History   Social History  . Marital Status: Married    Spouse Name: N/A    Number of Children: 2  . Years of Education: N/A   Occupational History  . Music Minister     Pianoist  .     Social History Main Topics  . Smoking status: Never Smoker   . Smokeless tobacco: Never Used  . Alcohol Use: No  . Drug Use: No  . Sexually Active:  Not on file   Other Topics Concern  . Not on file   Social History Narrative  . No narrative on file     No Known Allergies   Outpatient Prescriptions Prior to Visit  Medication Sig Dispense Refill  . cholecalciferol (VITAMIN D) 1000 UNITS tablet Take 2,000 Units by mouth every morning.       . Cyanocobalamin (VITAMIN B-12 CR) 1500 MCG TBCR Take 1 tablet by mouth daily. 1000mg  daily      . diclofenac (VOLTAREN) 75 MG EC tablet Take 75 mg by mouth daily. Patient only takes as needed      . Loratadine 10 MG CAPS Take 1 capsule by mouth daily with breakfast.       . montelukast (SINGULAIR) 10 MG tablet Take 10 mg by mouth at bedtime.       Marland Kitchen olmesartan-hydrochlorothiazide (BENICAR HCT) 40-25 MG per tablet Take 1 tablet by mouth daily with breakfast.       . RABEprazole (ACIPHEX) 20 MG tablet Take 20 mg by mouth daily before breakfast.       . Fluticasone-Salmeterol (ADVAIR) 500-50 MCG/DOSE AEPB Inhale 1 puff into the lungs every 12 (twelve) hours.           Review of Systems 11 pt Ros neg except as per HPI  Objective:   Physical Exam Filed Vitals:   08/05/11 1609  BP: 110/78  Pulse: 94  Temp: 97.7 F (36.5 C)  TempSrc: Oral  Height: 5' 1.5" (1.562 m)  Weight: 62.778 kg (138 lb 6.4 oz)  SpO2: 97%    Gen: Pleasant, well-nourished, in no distress,  normal affect  ENT: No lesions,  mouth clear,  oropharynx clear, no postnasal drip  Neck: No JVD, no TMG, no carotid bruits  Lungs: No use of accessory muscles, no dullness to percussion, clear without rales or rhonchi  Cardiovascular: RRR, heart sounds normal, no murmur or gallops, no peripheral edema  Abdomen: soft and NT, no HSM,  BS normal  Musculoskeletal: No deformities, no cyanosis or clubbing  Neuro: alert, non focal  Skin: Warm, no lesions or rashes  No results found.        Assessment & Plan:   ASTHMA Moderate persistent asthma with pos response to advair Pt is cleared for planned surgical  intervention Plan Cleared for OR Reduce Advair to 250    Updated Medication List Outpatient Encounter Prescriptions as of 08/05/2011  Medication Sig Dispense Refill  . cholecalciferol (VITAMIN D) 1000 UNITS tablet Take 2,000 Units by mouth every morning.       . Cyanocobalamin (VITAMIN B-12 CR) 1500 MCG TBCR Take 1 tablet by mouth daily. 1000mg  daily      . diclofenac (VOLTAREN) 75 MG EC tablet Take 75 mg by mouth daily. Patient only takes as needed      . levalbuterol (XOPENEX) 1.25 MG/3ML nebulizer solution as needed.      . Loratadine 10 MG CAPS Take 1 capsule by mouth daily with breakfast.       . montelukast (SINGULAIR) 10 MG tablet Take 10 mg by mouth at bedtime.       . mupirocin ointment (BACTROBAN) 2 % as directed. X 5 days      . olmesartan-hydrochlorothiazide (BENICAR HCT) 40-25 MG per tablet Take 1 tablet by mouth daily with breakfast.       . potassium chloride SA (K-DUR,KLOR-CON) 20 MEQ tablet Take 2 tablets by mouth daily. X 5 days      . RABEprazole (ACIPHEX) 20 MG tablet Take 20 mg by mouth daily before breakfast.       . DISCONTD: Fluticasone-Salmeterol (ADVAIR) 500-50 MCG/DOSE AEPB Inhale 1 puff into the lungs every 12 (twelve) hours.      . Fluticasone-Salmeterol (ADVAIR) 250-50 MCG/DOSE AEPB Inhale 1 puff into the lungs 2 (two) times daily.  1 each  6

## 2011-08-05 NOTE — Pre-Procedure Instructions (Signed)
Received faxed order from Dr Charlann Boxer to repeat BMET AM of surgery and that [facility] was called in       from [facility] and placed on chart

## 2011-08-06 NOTE — Assessment & Plan Note (Signed)
Moderate persistent asthma with pos response to advair Pt is cleared for planned surgical intervention Plan Cleared for OR Reduce Advair to 250

## 2011-08-11 NOTE — H&P (Signed)
[name] is an 61 y.o. female.    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 female who initially had her left hip replaced on 01/09/2007 with an ASR style hip arthroplasty.  She had originally done well but progressively she had more catching, pain and squeaking of the left hip.  She had lab work obtained which revealed increased chromium and cobalt levels. X-rays in the clinic reveal an ASR hip replacement in proper position. Various options are discussed with the patient. Risks, benefits and expectations were discussed with the patient. Patient understand the risks, benefits and expectations and wishes to proceed with surgery.  Patient has been clear by Dr. Clyde Canterbury as long as she was able to get respiratory clearance. She was seen by Dr. Delford Field who gave her respiratory clearance.  PCP:  Elie Confer, MD  D/C Plans:  SNF/Rehab  Post-op Meds:  No Rx given   Tranexamic Acid:   To be given  Decadron:   To be given  PMH: Diagnosis  . Allergic rhinitis  . Hyperlipemia  . GERD (gastroesophageal reflux disease)  . Polio  . Arthritis  . Unspecified essential hypertension  . Asthma    PSH: Procedure  . Cesarean section   x2   . Leg surgery - bilateral, numerous  . Total hip arthroplasty - right  . Wrist surgery - left   . Carpal tunnel release - right  . Colon surgery - scar tissue removed  . Vocal surgery - cyst removed  . Breast surgery - bilateral fibroid tumors removed  . Tooth implant - titanium/ front upper right    Social History:  reports that she has never smoked. She has never used smokeless tobacco. She reports that she does not drink alcohol or use illicit drugs.  Allergies:  No Known Allergies  Medications: No current facility-administered medications for this encounter.   Current Outpatient Prescriptions  Medication Sig Dispense Refill  . cholecalciferol (VITAMIN D) 1000 UNITS tablet  Take 2,000 Units by mouth every morning.       . Cyanocobalamin (VITAMIN B-12 CR) 1500 MCG TBCR Take 1 tablet by mouth daily. 1000mg  daily      . diclofenac (VOLTAREN) 75 MG EC tablet Take 75 mg by mouth daily. Patient only takes as needed      . Fluticasone-Salmeterol (ADVAIR) 250-50 MCG/DOSE AEPB Inhale 1 puff into the lungs 2 (two) times daily.  1 each  6  . levalbuterol (XOPENEX) 1.25 MG/3ML nebulizer solution as needed.      . Loratadine 10 MG CAPS Take 1 capsule by mouth daily with breakfast.       . montelukast (SINGULAIR) 10 MG tablet Take 10 mg by mouth at bedtime.       . mupirocin ointment (BACTROBAN) 2 % as directed. X 5 days      . olmesartan-hydrochlorothiazide (BENICAR HCT) 40-25 MG per tablet Take 1 tablet by mouth daily with breakfast.       . potassium chloride SA (K-DUR,KLOR-CON) 20 MEQ tablet Take 2 tablets by mouth daily. X 5 days      . RABEprazole (ACIPHEX) 20 MG tablet Take 20 mg by mouth daily before breakfast.         ROS: Review of Systems  Constitutional: Negative.   HENT: Negative.   Eyes: Negative.   Respiratory: Negative.   Cardiovascular: Negative.   Gastrointestinal: Negative.   Genitourinary: Negative.   Musculoskeletal:  Positive for joint pain.  Skin: Negative.   Neurological: Negative.   Endo/Heme/Allergies: Negative.   Psychiatric/Behavioral: Negative.      Physical Exam: BP: 141/82 ; HR: 84 ; Resp: 14 ; Physical Exam  Constitutional: She is oriented to person, place, and time and well-developed, well-nourished, and in no distress.  HENT:  Head: Normocephalic and atraumatic.  Nose: Nose normal.  Mouth/Throat: Oropharynx is clear and moist.  Eyes: Pupils are equal, round, and reactive to light.  Neck: Neck supple. No JVD present. No tracheal deviation present. No thyromegaly present.  Cardiovascular: Normal rate, regular rhythm, normal heart sounds and intact distal pulses.   Pulmonary/Chest: Effort normal and breath sounds normal. No  stridor. No respiratory distress. She has no wheezes. She has no rales. She exhibits no tenderness.  Abdominal: Soft. There is no tenderness. There is no guarding.  Musculoskeletal:       Right hip: She exhibits decreased range of motion, decreased strength, tenderness, bony tenderness and laceration (prior surgical incision). She exhibits no swelling, no crepitus and no deformity.  Lymphadenopathy:    She has no cervical adenopathy.  Neurological: She is alert and oriented to person, place, and time.  Skin: Skin is warm and dry.  Psychiatric: Affect normal.     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 Charlann Boxer at [facility]. Risks benefits and expectation were discussed with the patient. Patient understand risks, benefits and expectation and wishes to proceed.   [name]   PAC  08/11/2011, 6:06 PM

## 2011-08-15 ENCOUNTER — Inpatient Hospital Stay (HOSPITAL_COMMUNITY): Payer: BC Managed Care – PPO

## 2011-08-15 ENCOUNTER — Encounter (HOSPITAL_COMMUNITY): Payer: Self-pay | Admitting: *Deleted

## 2011-08-15 ENCOUNTER — Inpatient Hospital Stay (HOSPITAL_COMMUNITY): Payer: BC Managed Care – PPO | Admitting: Anesthesiology

## 2011-08-15 ENCOUNTER — Inpatient Hospital Stay (HOSPITAL_COMMUNITY)
Admission: RE | Admit: 2011-08-15 | Discharge: 2011-08-18 | DRG: 817 | Disposition: A | Discharge status: Skilled Nursing Facility | Payer: BC Managed Care – PPO | Source: Ambulatory Visit | Attending: Orthopedic Surgery | Admitting: Orthopedic Surgery

## 2011-08-15 ENCOUNTER — Encounter (HOSPITAL_COMMUNITY): Payer: Self-pay | Admitting: Anesthesiology

## 2011-08-15 ENCOUNTER — Encounter (HOSPITAL_COMMUNITY)
Admission: RE | Disposition: A | Discharge status: Skilled Nursing Facility | Payer: Self-pay | Source: Ambulatory Visit | Attending: Orthopedic Surgery

## 2011-08-15 DIAGNOSIS — R7989 Other specified abnormal findings of blood chemistry: Secondary | ICD-10-CM | POA: Diagnosis present

## 2011-08-15 DIAGNOSIS — I10 Essential (primary) hypertension: Secondary | ICD-10-CM | POA: Diagnosis present

## 2011-08-15 DIAGNOSIS — K219 Gastro-esophageal reflux disease without esophagitis: Secondary | ICD-10-CM | POA: Diagnosis present

## 2011-08-15 DIAGNOSIS — Z79899 Other long term (current) drug therapy: Secondary | ICD-10-CM

## 2011-08-15 DIAGNOSIS — M25559 Pain in unspecified hip: Secondary | ICD-10-CM | POA: Diagnosis present

## 2011-08-15 DIAGNOSIS — J45909 Unspecified asthma, uncomplicated: Secondary | ICD-10-CM | POA: Diagnosis present

## 2011-08-15 DIAGNOSIS — Z96649 Presence of unspecified artificial hip joint: Secondary | ICD-10-CM

## 2011-08-15 DIAGNOSIS — E7800 Pure hypercholesterolemia, unspecified: Secondary | ICD-10-CM | POA: Diagnosis present

## 2011-08-15 DIAGNOSIS — Y831 Surgical operation with implant of artificial internal device as the cause of abnormal reaction of the patient, or of later complication, without mention of misadventure at the time of the procedure: Secondary | ICD-10-CM | POA: Diagnosis present

## 2011-08-15 DIAGNOSIS — Z8612 Personal history of poliomyelitis: Secondary | ICD-10-CM

## 2011-08-15 DIAGNOSIS — M129 Arthropathy, unspecified: Secondary | ICD-10-CM | POA: Diagnosis present

## 2011-08-15 DIAGNOSIS — T84069A Wear of articular bearing surface of unspecified internal prosthetic joint, initial encounter: Principal | ICD-10-CM | POA: Diagnosis present

## 2011-08-15 HISTORY — PX: TOTAL HIP REVISION: SHX763

## 2011-08-15 LAB — BASIC METABOLIC PANEL
BUN: 13 mg/dL (ref 6–23)
GFR calc Af Amer: >90 mL/min (ref >90)
GFR calc non Af Amer: >90 mL/min (ref >90)
Potassium: 4.1 mEq/L (ref 3.5–5.1)
Sodium: 141 mEq/L (ref 135–145)

## 2011-08-15 LAB — TYPE AND SCREEN
ABO/RH(D): A POS
Antibody Screen: NEGATIVE

## 2011-08-15 SURGERY — TOTAL HIP REVISION
Anesthesia: Spinal | Site: Hip | Laterality: Right | Wound class: Clean

## 2011-08-15 MED ORDER — HYDROCODONE-ACETAMINOPHEN 7.5-325 MG PO TABS
1.0000 | ORAL_TABLET | ORAL | Status: DC
  Administered 2011-08-15 – 2011-08-16 (×3): 2 via ORAL
  Administered 2011-08-16 (×2): 1 via ORAL
  Administered 2011-08-16 – 2011-08-17 (×4): 2 via ORAL
  Administered 2011-08-17 (×3): 1 via ORAL
  Administered 2011-08-17 – 2011-08-18 (×4): 2 via ORAL
  Filled 2011-08-15: qty 1
  Filled 2011-08-15 (×6): qty 2
  Filled 2011-08-15 (×3): qty 1
  Filled 2011-08-15 (×2): qty 2
  Filled 2011-08-15: qty 1
  Filled 2011-08-15 (×3): qty 2

## 2011-08-15 MED ORDER — FERROUS SULFATE 325 (65 FE) MG PO TABS
325.0000 mg | ORAL_TABLET | Freq: Three times a day (TID) | ORAL | Status: DC
  Administered 2011-08-16 – 2011-08-18 (×6): 325 mg via ORAL
  Filled 2011-08-15 (×11): qty 1

## 2011-08-15 MED ORDER — MENTHOL 3 MG MT LOZG: 1.0000 | LOZENGE | OROMUCOSAL | Status: DC | PRN

## 2011-08-15 MED ORDER — BISACODYL 5 MG PO TBEC: 5.0000 mg | DELAYED_RELEASE_TABLET | Freq: Every day | ORAL | Status: DC | PRN

## 2011-08-15 MED ORDER — METOCLOPRAMIDE HCL 5 MG/ML IJ SOLN: 5.0000 mg | Freq: Three times a day (TID) | INTRAMUSCULAR | Status: DC | PRN

## 2011-08-15 MED ORDER — LORATADINE 10 MG PO TABS
10.0000 mg | ORAL_TABLET | Freq: Every day | ORAL | Status: DC
  Administered 2011-08-16 – 2011-08-18 (×3): 10 mg via ORAL
  Filled 2011-08-15 (×3): qty 1

## 2011-08-15 MED ORDER — HYDROMORPHONE HCL PF 1 MG/ML IJ SOLN
0.2500 mg | INTRAMUSCULAR | Status: DC | PRN
  Administered 2011-08-15 (×3): 0.5 mg via INTRAVENOUS

## 2011-08-15 MED ORDER — PROPOFOL 10 MG/ML IV EMUL
INTRAVENOUS | Status: DC | PRN
  Administered 2011-08-15: 25 ug/kg/min via INTRAVENOUS

## 2011-08-15 MED ORDER — HYDROCHLOROTHIAZIDE 25 MG PO TABS
25.0000 mg | ORAL_TABLET | Freq: Every day | ORAL | Status: DC
  Administered 2011-08-17 – 2011-08-18 (×2): 25 mg via ORAL
  Filled 2011-08-15 (×3): qty 1

## 2011-08-15 MED ORDER — DIPHENHYDRAMINE HCL 25 MG PO CAPS: 25.0000 mg | ORAL_CAPSULE | Freq: Four times a day (QID) | ORAL | Status: DC | PRN

## 2011-08-15 MED ORDER — FLUTICASONE-SALMETEROL 250-50 MCG/DOSE IN AEPB
1.0000 | INHALATION_SPRAY | Freq: Two times a day (BID) | RESPIRATORY_TRACT | Status: DC
  Administered 2011-08-15 – 2011-08-18 (×6): 1 via RESPIRATORY_TRACT
  Filled 2011-08-15: qty 14

## 2011-08-15 MED ORDER — MONTELUKAST SODIUM 10 MG PO TABS
10.0000 mg | ORAL_TABLET | Freq: Every day | ORAL | Status: DC
  Administered 2011-08-15 – 2011-08-17 (×3): 10 mg via ORAL
  Filled 2011-08-15 (×4): qty 1

## 2011-08-15 MED ORDER — CEFAZOLIN SODIUM-DEXTROSE 2-3 GM-% IV SOLR
2.0000 g | Freq: Four times a day (QID) | INTRAVENOUS | Status: AC
  Administered 2011-08-15 (×2): 2 g via INTRAVENOUS
  Filled 2011-08-15 (×2): qty 50

## 2011-08-15 MED ORDER — RIVAROXABAN 10 MG PO TABS
10.0000 mg | ORAL_TABLET | ORAL | Status: DC
  Administered 2011-08-16 – 2011-08-18 (×3): 10 mg via ORAL
  Filled 2011-08-15 (×4): qty 1

## 2011-08-15 MED ORDER — METHOCARBAMOL 100 MG/ML IJ SOLN
500.0000 mg | Freq: Four times a day (QID) | INTRAMUSCULAR | Status: DC | PRN
  Administered 2011-08-15 – 2011-08-16 (×3): 500 mg via INTRAVENOUS
  Filled 2011-08-15 (×3): qty 5

## 2011-08-15 MED ORDER — FLEET ENEMA 7-19 GM/118ML RE ENEM: 1.0000 | ENEMA | Freq: Once | RECTAL | Status: AC | PRN

## 2011-08-15 MED ORDER — PANTOPRAZOLE SODIUM 40 MG PO TBEC
40.0000 mg | DELAYED_RELEASE_TABLET | Freq: Every day | ORAL | Status: DC
  Administered 2011-08-16 – 2011-08-18 (×3): 40 mg via ORAL
  Filled 2011-08-15 (×3): qty 1

## 2011-08-15 MED ORDER — ONDANSETRON HCL 4 MG/2ML IJ SOLN
INTRAMUSCULAR | Status: DC | PRN
  Administered 2011-08-15: 4 mg via INTRAVENOUS

## 2011-08-15 MED ORDER — PHENOL 1.4 % MT LIQD: 1.0000 | OROMUCOSAL | Status: DC | PRN

## 2011-08-15 MED ORDER — DEXAMETHASONE SODIUM PHOSPHATE 10 MG/ML IJ SOLN
10.0000 mg | Freq: Once | INTRAMUSCULAR | Status: AC
  Administered 2011-08-16: 10 mg via INTRAVENOUS
  Filled 2011-08-15: qty 1

## 2011-08-15 MED ORDER — ALUM & MAG HYDROXIDE-SIMETH 200-200-20 MG/5ML PO SUSP
30.0000 mL | ORAL | Status: DC | PRN
  Administered 2011-08-17: 30 mL via ORAL
  Filled 2011-08-15: qty 30

## 2011-08-15 MED ORDER — LACTATED RINGERS IV SOLN: INTRAVENOUS | Status: DC

## 2011-08-15 MED ORDER — ONDANSETRON HCL 4 MG PO TABS: 4.0000 mg | ORAL_TABLET | Freq: Four times a day (QID) | ORAL | Status: DC | PRN

## 2011-08-15 MED ORDER — MIDAZOLAM HCL 5 MG/5ML IJ SOLN
INTRAMUSCULAR | Status: DC | PRN
  Administered 2011-08-15: 2 mg via INTRAVENOUS

## 2011-08-15 MED ORDER — METOCLOPRAMIDE HCL 10 MG PO TABS: 5.0000 mg | ORAL_TABLET | Freq: Three times a day (TID) | ORAL | Status: DC | PRN

## 2011-08-15 MED ORDER — FENTANYL CITRATE 0.05 MG/ML IJ SOLN
INTRAMUSCULAR | Status: DC | PRN
  Administered 2011-08-15: 100 ug via INTRAVENOUS

## 2011-08-15 MED ORDER — LACTATED RINGERS IV SOLN
INTRAVENOUS | Status: DC
  Administered 2011-08-15: 12:00:00 via INTRAVENOUS
  Administered 2011-08-15: 1000 mL via INTRAVENOUS

## 2011-08-15 MED ORDER — 0.9 % SODIUM CHLORIDE (POUR BTL) OPTIME
TOPICAL | Status: DC | PRN
  Administered 2011-08-15: 1000 mL

## 2011-08-15 MED ORDER — HYDROMORPHONE HCL PF 1 MG/ML IJ SOLN
INTRAMUSCULAR | Status: AC
  Filled 2011-08-15: qty 1

## 2011-08-15 MED ORDER — CEFAZOLIN SODIUM-DEXTROSE 2-3 GM-% IV SOLR
2.0000 g | INTRAVENOUS | Status: AC
  Administered 2011-08-15: 2 g via INTRAVENOUS

## 2011-08-15 MED ORDER — POLYETHYLENE GLYCOL 3350 17 G PO PACK
17.0000 g | PACK | Freq: Every day | ORAL | Status: DC | PRN
  Administered 2011-08-17: 17 g via ORAL

## 2011-08-15 MED ORDER — IRBESARTAN 300 MG PO TABS
300.0000 mg | ORAL_TABLET | Freq: Every day | ORAL | Status: DC
  Administered 2011-08-17 – 2011-08-18 (×2): 300 mg via ORAL
  Filled 2011-08-15 (×3): qty 1

## 2011-08-15 MED ORDER — POTASSIUM CHLORIDE 2 MEQ/ML IV SOLN
100.0000 mL/h | INTRAVENOUS | Status: DC
  Administered 2011-08-15 – 2011-08-16 (×4): 100 mL/h via INTRAVENOUS
  Filled 2011-08-15 (×15): qty 1000

## 2011-08-15 MED ORDER — DOCUSATE SODIUM 100 MG PO CAPS
100.0000 mg | ORAL_CAPSULE | Freq: Two times a day (BID) | ORAL | Status: DC
  Administered 2011-08-15 – 2011-08-18 (×6): 100 mg via ORAL

## 2011-08-15 MED ORDER — BLISTEX EX OINT
TOPICAL_OINTMENT | CUTANEOUS | Status: AC
  Administered 2011-08-15: 22:00:00
  Filled 2011-08-15: qty 10

## 2011-08-15 MED ORDER — DEXAMETHASONE SODIUM PHOSPHATE 4 MG/ML IJ SOLN
INTRAMUSCULAR | Status: DC | PRN
  Administered 2011-08-15: 10 mg via INTRAVENOUS

## 2011-08-15 MED ORDER — CELECOXIB 200 MG PO CAPS
200.0000 mg | ORAL_CAPSULE | Freq: Two times a day (BID) | ORAL | Status: DC
  Administered 2011-08-15 – 2011-08-18 (×6): 200 mg via ORAL
  Filled 2011-08-15 (×7): qty 1

## 2011-08-15 MED ORDER — METHOCARBAMOL 500 MG PO TABS
500.0000 mg | ORAL_TABLET | Freq: Four times a day (QID) | ORAL | Status: DC | PRN
  Administered 2011-08-16 – 2011-08-18 (×4): 500 mg via ORAL
  Filled 2011-08-15 (×4): qty 1

## 2011-08-15 MED ORDER — ONDANSETRON HCL 4 MG/2ML IJ SOLN: 4.0000 mg | Freq: Four times a day (QID) | INTRAMUSCULAR | Status: DC | PRN

## 2011-08-15 MED ORDER — ALBUTEROL SULFATE (5 MG/ML) 0.5% IN NEBU: 2.5000 mg | INHALATION_SOLUTION | RESPIRATORY_TRACT | Status: DC | PRN

## 2011-08-15 MED ORDER — BUPIVACAINE HCL 0.75 % IJ SOLN
INTRAMUSCULAR | Status: DC | PRN
  Administered 2011-08-15: 15 mg via INTRATHECAL

## 2011-08-15 MED ORDER — HYDROMORPHONE HCL PF 1 MG/ML IJ SOLN
0.5000 mg | INTRAMUSCULAR | Status: DC | PRN
  Administered 2011-08-15: 1 mg via INTRAVENOUS

## 2011-08-15 MED ORDER — ZOLPIDEM TARTRATE 5 MG PO TABS: 5.0000 mg | ORAL_TABLET | Freq: Every evening | ORAL | Status: DC | PRN

## 2011-08-15 MED ORDER — ACETAMINOPHEN 10 MG/ML IV SOLN
INTRAVENOUS | Status: DC | PRN
  Administered 2011-08-15: 1000 mg via INTRAVENOUS

## 2011-08-15 MED ORDER — TRANEXAMIC ACID 100 MG/ML IV SOLN
940.0000 mg | Freq: Once | INTRAVENOUS | Status: AC
  Administered 2011-08-15: 940 mg via INTRAVENOUS
  Filled 2011-08-15: qty 9.4

## 2011-08-15 MED ORDER — LORATADINE 10 MG PO CAPS
1.0000 | ORAL_CAPSULE | Freq: Every day | ORAL | Status: DC
  Filled 2011-08-15: qty 1

## 2011-08-15 MED ORDER — OLMESARTAN MEDOXOMIL-HCTZ 40-25 MG PO TABS: 1.0000 | ORAL_TABLET | Freq: Every day | ORAL | Status: DC

## 2011-08-15 MED ORDER — EPHEDRINE SULFATE 50 MG/ML IJ SOLN
INTRAMUSCULAR | Status: DC | PRN
  Administered 2011-08-15: 10 mg via INTRAVENOUS
  Administered 2011-08-15 (×4): 5 mg via INTRAVENOUS
  Administered 2011-08-15: 10 mg via INTRAVENOUS

## 2011-08-15 SURGICAL SUPPLY — 62 items
ADH SKN CLS APL DERMABOND .7 (GAUZE/BANDAGES/DRESSINGS) ×2
BAG SPEC THK2 15X12 ZIP CLS (MISCELLANEOUS) ×1
BAG ZIPLOCK 12X15 (MISCELLANEOUS) ×2 IMPLANT
BLADE SAW SGTL 18X1.27X75 (BLADE) ×1 IMPLANT
BRUSH FEMORAL CANAL (MISCELLANEOUS) IMPLANT
CLOTH BEACON ORANGE TIMEOUT ST (SAFETY) ×2 IMPLANT
DERMABOND ADVANCED (GAUZE/BANDAGES/DRESSINGS) ×2
DERMABOND ADVANCED .7 DNX12 (GAUZE/BANDAGES/DRESSINGS) ×1 IMPLANT
DRAPE INCISE IOBAN 85X60 (DRAPES) ×2 IMPLANT
DRAPE ORTHO SPLIT 77X108 STRL (DRAPES) ×4
DRAPE POUCH INSTRU U-SHP 10X18 (DRAPES) ×2 IMPLANT
DRAPE SURG 17X11 SM STRL (DRAPES) ×2 IMPLANT
DRAPE SURG ORHT 6 SPLT 77X108 (DRAPES) ×2 IMPLANT
DRAPE U-SHAPE 47X51 STRL (DRAPES) ×2 IMPLANT
DRSG AQUACEL AG ADV 3.5X10 (GAUZE/BANDAGES/DRESSINGS) ×1 IMPLANT
DRSG AQUACEL AG ADV 3.5X14 (GAUZE/BANDAGES/DRESSINGS) ×1 IMPLANT
DRSG EMULSION OIL 3X16 NADH (GAUZE/BANDAGES/DRESSINGS) ×1 IMPLANT
DRSG MEPILEX BORDER 4X4 (GAUZE/BANDAGES/DRESSINGS) ×1 IMPLANT
DRSG MEPILEX BORDER 4X8 (GAUZE/BANDAGES/DRESSINGS) ×1 IMPLANT
DRSG TEGADERM 4X4.75 (GAUZE/BANDAGES/DRESSINGS) ×2 IMPLANT
DURAPREP 26ML APPLICATOR (WOUND CARE) ×2 IMPLANT
ELECT BLADE TIP CTD 4 INCH (ELECTRODE) ×2 IMPLANT
ELECT REM PT RETURN 9FT ADLT (ELECTROSURGICAL) ×2
ELECTRODE REM PT RTRN 9FT ADLT (ELECTROSURGICAL) ×1 IMPLANT
ELIMINATOR HOLE APEX DEPUY (Hips) ×1 IMPLANT
EVACUATOR 1/8 PVC DRAIN (DRAIN) ×1 IMPLANT
FACESHIELD LNG OPTICON STERILE (SAFETY) ×8 IMPLANT
GAUZE SPONGE 2X2 8PLY STRL LF (GAUZE/BANDAGES/DRESSINGS) ×1 IMPLANT
GLOVE BIOGEL PI IND STRL 7.5 (GLOVE) ×1 IMPLANT
GLOVE BIOGEL PI IND STRL 8 (GLOVE) ×1 IMPLANT
GLOVE BIOGEL PI INDICATOR 7.5 (GLOVE) ×1
GLOVE BIOGEL PI INDICATOR 8 (GLOVE) ×1
GLOVE ORTHO TXT STRL SZ7.5 (GLOVE) ×4 IMPLANT
GLOVE SURG ORTHO 8.0 STRL STRW (GLOVE) ×2 IMPLANT
GOWN BRE IMP PREV XXLGXLNG (GOWN DISPOSABLE) ×4 IMPLANT
GOWN STRL NON-REIN LRG LVL3 (GOWN DISPOSABLE) ×2 IMPLANT
HANDPIECE INTERPULSE COAX TIP (DISPOSABLE)
HEAD CERAMIC DELTA 36 PLUS 1.5 (Hips) ×1 IMPLANT
KIT BASIN OR (CUSTOM PROCEDURE TRAY) ×2 IMPLANT
LINER NEUTRAL 52X36MM PLUS 4 (Liner) ×1 IMPLANT
MANIFOLD NEPTUNE II (INSTRUMENTS) ×2 IMPLANT
NS IRRIG 1000ML POUR BTL (IV SOLUTION) ×3 IMPLANT
PACK TOTAL JOINT (CUSTOM PROCEDURE TRAY) ×2 IMPLANT
PIN SECTOR W/GRIP ACE CUP 52MM (Hips) ×1 IMPLANT
POSITIONER SURGICAL ARM (MISCELLANEOUS) ×2 IMPLANT
PRESSURIZER FEMORAL UNIV (MISCELLANEOUS) IMPLANT
SCREW 6.5MMX35MM (Screw) ×1 IMPLANT
SET HNDPC FAN SPRY TIP SCT (DISPOSABLE) IMPLANT
SPONGE GAUZE 2X2 STER 10/PKG (GAUZE/BANDAGES/DRESSINGS)
SPONGE LAP 18X18 X RAY DECT (DISPOSABLE) ×1 IMPLANT
SPONGE LAP 4X18 X RAY DECT (DISPOSABLE) ×1 IMPLANT
STAPLER VISISTAT 35W (STAPLE) ×1 IMPLANT
SUCTION FRAZIER TIP 10 FR DISP (SUCTIONS) ×2 IMPLANT
SUT MNCRL AB 4-0 PS2 18 (SUTURE) ×1 IMPLANT
SUT VIC AB 1 CT1 36 (SUTURE) ×4 IMPLANT
SUT VIC AB 2-0 CT1 27 (SUTURE) ×6
SUT VIC AB 2-0 CT1 TAPERPNT 27 (SUTURE) ×3 IMPLANT
SUT VLOC 180 0 24IN GS25 (SUTURE) ×3 IMPLANT
TOWEL OR 17X26 10 PK STRL BLUE (TOWEL DISPOSABLE) ×4 IMPLANT
TOWER CARTRIDGE SMART MIX (DISPOSABLE) IMPLANT
TRAY FOLEY CATH 14FRSI W/METER (CATHETERS) ×2 IMPLANT
WATER STERILE IRR 1500ML POUR (IV SOLUTION) ×2 IMPLANT

## 2011-08-15 NOTE — Brief Op Note (Signed)
08/15/2011  12:34 PM  PATIENT:  Eric Form  61 y.o. 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 PROCEDURE:  Procedure(s) (LRB): TOTAL HIP REVISION (Right)  Depuy 52mm Gription shell with 36+4 Altrex neutral liner, 36 + 1.5 delta ceramic ball  SURGEON:  Surgeon(s) and Role:    * Shelda Pal, MD - Primary  PHYSICIAN ASSISTANT: Lanney Gins, PA-C  ANESTHESIA:   general  EBL:  Total I/O In: 1109.4 [I.V.:1000; IV Piggyback:109.4] Out: 450 [Urine:250; Blood:200]  BLOOD ADMINISTERED:none  DRAINS: (1 medium) Hemovact drain(s) in the right hip with  Suction Open   LOCAL MEDICATIONS USED:  NONE  SPECIMEN:  Femoral head and acetabular component  DISPOSITION OF SPECIMEN:  PATHOLOGY  COUNTS:  YES  TOURNIQUET:  * No tourniquets in log *  DICTATION: .Other Dictation: Dictation Number [id number]  PLAN OF CARE: Admit to inpatient   PATIENT DISPOSITION:  PACU - hemodynamically stable.   Delay start of Pharmacological VTE agent (>24hrs) due to surgical blood loss or risk of bleeding: no

## 2011-08-15 NOTE — Anesthesia Postprocedure Evaluation (Signed)
  Anesthesia Post-op Note  Patient: [name]  Procedure(s) Performed: Procedure(s) (LRB): TOTAL HIP REVISION (Right)  Patient Location: PACU  Anesthesia Type: Spinal  Level of Consciousness: awake and alert   Airway and Oxygen Therapy: Patient Spontanous Breathing  Post-op Pain: mild  Post-op Assessment: Post-op Vital signs reviewed, Patient's Cardiovascular Status Stable, Respiratory Function Stable, Patent Airway and No signs of Nausea or vomiting  Post-op Vital Signs: stable  Complications: No apparent anesthesia complications

## 2011-08-15 NOTE — Transfer of Care (Signed)
Immediate Anesthesia Transfer of Care Note  Patient: [name]  Procedure(s) Performed: Procedure(s) (LRB): TOTAL HIP REVISION (Right)  Patient Location: PACU  Anesthesia Type: Spinal  Level of Consciousness: awake, alert , oriented and patient cooperative  Airway & Oxygen Therapy: Patient Spontanous Breathing and Patient connected to face mask oxygen  Post-op Assessment: Report given to PACU RN and Post -op Vital signs reviewed and stable  Post vital signs: Reviewed and stable  Complications: No apparent anesthesia complications

## 2011-08-15 NOTE — Preoperative (Signed)
Beta Blockers   Reason not to administer Beta Blockers:Not Applicable 

## 2011-08-15 NOTE — Interval H&P Note (Signed)
History and Physical Interval Note:  08/15/2011 10:05 AM  [name]  has presented today for surgery, with the diagnosis of Failed Right Total Hip  The various methods of treatment have been discussed with the patient and family. After consideration of risks, benefits and other options for treatment, the patient has consented to  Procedure(s) (LRB): RIGHT TOTAL HIP REVISION (Right) as a surgical intervention .  The patient's history has been reviewed, patient examined, no change in status, stable for surgery.  I have reviewed the patient's chart and labs.  Questions were answered to the patient's satisfaction.     Shelda Pal

## 2011-08-15 NOTE — Anesthesia Preprocedure Evaluation (Addendum)
Anesthesia Evaluation  Patient identified by MRN, date of birth, ID band Patient awake    Reviewed: Allergy & Precautions, H&P , NPO status , Patient's Chart, lab work & pertinent test results  Airway Mallampati: II TM Distance: >3 FB Neck ROM: full    Dental No notable dental hx. (+) Teeth Intact, Dental Advisory Given and Implants,    Pulmonary neg pulmonary ROS, asthma ,  breath sounds clear to auscultation  Pulmonary exam normal       Cardiovascular Exercise Tolerance: Good hypertension, Pt. on medications negative cardio ROS  Rhythm:regular Rate:Normal     Neuro/Psych Post polio syndrome with respiratory weakness. negative neurological ROS  negative psych ROS   GI/Hepatic negative GI ROS, Neg liver ROS, GERD-  Medicated and Controlled,  Endo/Other  negative endocrine ROS  Renal/GU negative Renal ROS  negative genitourinary   Musculoskeletal   Abdominal   Peds  Hematology negative hematology ROS (+)   Anesthesia Other Findings   Reproductive/Obstetrics negative OB ROS                          Anesthesia Physical Anesthesia Plan  ASA: III  Anesthesia Plan: Spinal   Post-op Pain Management:    Induction:   Airway Management Planned:   Additional Equipment:   Intra-op Plan:   Post-operative Plan:   Informed Consent: I have reviewed the patients History and Physical, chart, labs and discussed the procedure including the risks, benefits and alternatives for the proposed anesthesia with the patient or authorized representative who has indicated his/her understanding and acceptance.   Dental Advisory Given  Plan Discussed with: CRNA and Surgeon  Anesthesia Plan Comments:        Anesthesia Quick Evaluation

## 2011-08-15 NOTE — Anesthesia Procedure Notes (Signed)
Spinal  Patient location during procedure: OR Start time: 08/15/2011 10:40 AM End time: 08/15/2011 10:45 AM Staffing Anesthesiologist: Ronelle Nigh L Performed by: anesthesiologist  Preanesthetic Checklist Completed: patient identified, site marked, surgical consent, pre-op evaluation, timeout performed, IV checked, risks and benefits discussed and monitors and equipment checked Spinal Block Patient position: sitting Prep: Betadine Patient monitoring: heart rate, continuous pulse ox and blood pressure Approach: midline Location: L2-3 Injection technique: single-shot Needle Needle type: Whitacre  Needle gauge: 25 G Needle length: 9 cm Assessment Sensory level: T6 Additional Notes Expiration date of kit checked and confirmed. Patient tolerated procedure well, without complications.

## 2011-08-16 ENCOUNTER — Encounter (HOSPITAL_COMMUNITY): Payer: Self-pay | Admitting: Orthopedic Surgery

## 2011-08-16 LAB — CBC
MCH: 31.1 pg (ref 26.0–34.0)
MCHC: 33.1 g/dL (ref 30.0–36.0)
Platelets: 337 10*3/uL (ref 150–400)
RBC: 3.54 MIL/uL — ABNORMAL LOW (ref 3.87–5.11)

## 2011-08-16 LAB — BASIC METABOLIC PANEL
Calcium: 8.9 mg/dL (ref 8.4–10.5)
GFR calc Af Amer: >90 mL/min (ref >90)
GFR calc non Af Amer: >90 mL/min (ref >90)
Sodium: 137 mEq/L (ref 135–145)

## 2011-08-16 NOTE — Evaluation (Signed)
Physical Therapy Evaluation Patient Details Name: [name] MRN: [id number] DOB: 1950-08-11 Today's Date: 08/16/2011 Time: 1478-2956 PT Time Calculation (min): 21 min  PT Assessment / Plan / Recommendation Clinical Impression  Pt wtih R THR revision and hx of polio presents with decreased Strength Bil LEs ( R  stronger than L) and significant limitations to functional mobility    PT Assessment  Patient needs continued PT services    Follow Up Recommendations  Skilled nursing facility    Barriers to Discharge        Equipment Recommendations  Defer to next venue    Recommendations for Other Services OT consult   Frequency 7X/week    Precautions / Restrictions Precautions Precautions: Posterior Hip Precaution Comments: sign hung in room Required Braces or Orthoses: Other Brace/Splint Other Brace/Splint: Pt uses full leg brace on L LE 2* residual effects of polio Restrictions Weight Bearing Restrictions: No Other Position/Activity Restrictions: WBAT         Mobility  Bed Mobility Bed Mobility: Supine to Sit Details for Bed Mobility Assistance: pt able to sit upright in bed with min assist but c/o dizziness.  States my BP has been low (99/62) and I don't want to pass out when I get up.     Exercises Total Joint Exercises Ankle Circles/Pumps: Right;10 reps;Supine;AROM Heel Slides: Right;10 reps;Supine;AAROM;AROM Hip ABduction/ADduction: AAROM;Right;10 reps;Supine   PT Diagnosis: Difficulty walking  PT Problem List: Decreased strength;Decreased range of motion;Decreased activity tolerance;Decreased mobility;Pain;Decreased knowledge of use of DME;Decreased knowledge of precautions PT Treatment Interventions: DME instruction;Gait training;Stair training;Functional mobility training;Therapeutic activities;Therapeutic exercise;Patient/family education   PT Goals Acute Rehab PT Goals PT Goal Formulation: With patient Time For Goal Achievement: 08/22/11 Potential to  Achieve Goals: Good Pt will go Supine/Side to Sit: with min assist PT Goal: Supine/Side to Sit - Progress: Goal set today Pt will go Sit to Supine/Side: with min assist PT Goal: Sit to Supine/Side - Progress: Goal set today Pt will go Sit to Stand: with min assist PT Goal: Sit to Stand - Progress: Goal set today Pt will go Stand to Sit: with min assist PT Goal: Stand to Sit - Progress: Goal set today Pt will Ambulate: 16 - 50 feet;with min assist;with rolling walker PT Goal: Ambulate - Progress: Goal set today  Visit Information  Last PT Received On: 08/16/11 Assistance Needed: +2    Subjective Data  Subjective: I have polio and my R leg usually helps my L leg so I am not going to move as fast as other people Patient Stated Goal: Resume previous lifestyle with decreased pain   Prior Functioning  Home Living Lives With: Spouse Prior Function Level of Independence: Independent with assistive device(s) Communication Communication: No difficulties    Cognition  Overall Cognitive Status: Appears within functional limits for tasks assessed/performed Arousal/Alertness: Awake/alert Orientation Level: Appears intact for tasks assessed Behavior During Session: [facility] for tasks performed    Extremity/Trunk Assessment Right Upper Extremity Assessment RUE ROM/Strength/Tone: [facility] for tasks assessed Left Upper Extremity Assessment LUE ROM/Strength/Tone: [facility] for tasks assessed Right Lower Extremity Assessment RLE ROM/Strength/Tone: Deficits RLE ROM/Strength/Tone Deficits: Hip strength 2+/5 with AAROM to 90 flex and 10 -15 abd Left Lower Extremity Assessment LLE ROM/Strength/Tone: Deficits LLE ROM/Strength/Tone Deficits: full AAROM except at ankle which is fused.  Strength max of 2-/5   Balance    End of Session PT - End of Session Activity Tolerance: Patient limited by fatigue;Other (comment) (c/o dizziness in bed with movement and concerns RE BP) Patient  left: in bed;with call bell/phone  within reach;with family/visitor present Nurse Communication: Mobility status  GP     Wilian Kwong 08/16/2011, 12:53 PM

## 2011-08-16 NOTE — Progress Notes (Signed)
Clinical Social Work Department CLINICAL SOCIAL WORK PLACEMENT NOTE 08/16/2011  Patient:  [name], [name]  Account Number:  [id number] Admit date:  08/15/2011  Clinical Social Worker:  Cori Razor, LCSW  Date/time:  08/16/2011 10:19 AM  Clinical Social Work is seeking post-discharge placement for this patient at the following level of care:   SKILLED NURSING   (*CSW will update this form in Epic as items are completed)     Patient/family provided with [facility] Department of Clinical Social Work's list of facilities offering this level of care within the geographic area requested by the patient (or if unable, by the patient's family).    Patient/family informed of their freedom to choose among providers that offer the needed level of care, that participate in Medicare, Medicaid or managed care program needed by the patient, have an available bed and are willing to accept the patient.    Patient/family informed of [facility]' ownership interest in [facility], as well as of the fact that they are under no obligation to receive care at this facility.  PASARR submitted to EDS on 08/15/2011 PASARR number received from EDS on 08/15/2011  FL2 transmitted to all facilities in geographic area requested by pt/family on  08/16/2011 FL2 transmitted to all facilities within larger geographic area on   Patient informed that his/her managed care company has contracts with or will negotiate with  certain facilities, including the following:     Patient/family informed of bed offers received:  08/16/2011 Patient chooses bed at [facility] Physician recommends and patient chooses bed at    Patient to be transferred to  on   Patient to be transferred to facility by   The following physician request were entered in Epic:   Additional Comments: BCBS approval required for SNF placement.  Cori Razor LCSW [phone]

## 2011-08-16 NOTE — Progress Notes (Signed)
   Subjective: 1 Day Post-Op Procedure(s) (LRB): TOTAL HIP REVISION (Right)   Patient reports pain as mild, pain well controlled. No events throughout the night.  Objective:   VITALS:   Filed Vitals:   08/16/11 0511  BP: 101/64  Pulse: 82  Temp: 98 F (36.7 C)  Resp: 14    Neurovascular intact Dorsiflexion/Plantar flexion intact Incision: dressing C/D/I No cellulitis present Compartment soft  LABS  Basename 08/16/11 0417  HGB 11.0*  HCT 33.2*  WBC 12.8*  PLT 337     Basename 08/16/11 0417 08/15/11 0815  NA 137 141  K 3.8 4.1  BUN 10 13  CREATININE 0.64 0.71  GLUCOSE 177* 107*     Assessment/Plan: 1 Day Post-Op Procedure(s) (LRB): TOTAL HIP REVISION (Right)   HV drain d/c'ed Foley cath kept for another day, urinary frequency and post polio syndrome. Advance diet Up with therapy D/C IV fluids Discharge to SNF [facility]) eventually   [name]. Yaslyn Cumby   PAC  08/16/2011, 8:20 AM

## 2011-08-16 NOTE — Progress Notes (Signed)
Clinical Social Work Department BRIEF PSYCHOSOCIAL ASSESSMENT 08/16/2011  Patient:  [name], [name]     Account Number:  [id number]     Admit date:  08/15/2011  Clinical Social Worker:  Candie Chroman  Date/Time:  08/16/2011 10:04 AM  Referred by:  Physician  Date Referred:  08/16/2011 Referred for  SNF Placement   Other Referral:   Interview type:  Patient Other interview type:    PSYCHOSOCIAL DATA Living Status:  HUSBAND Admitted from facility:   Level of care:   Primary support name:  Konnor Jorden Primary support relationship to patient:  SPOUSE Degree of support available:   supportive    CURRENT CONCERNS Current Concerns  Post-Acute Placement   Other Concerns:    SOCIAL WORK ASSESSMENT / PLAN Pt is a 61 yr old female living at home prior to hospitalization. CSW met with pt/spouse to assist with d/c planning. Pt has made arrangements to have ST rehab at [facility] following hospital d/c. CSW has contacted SNF and D/C plans confirmed pending BCBS approval. CSW will assist SNF with insurance auth process as well as d/c planning to SNF.   Assessment/plan status:  Psychosocial Support/Ongoing Assessment of Needs Other assessment/ plan:   Information/referral to community resources:   BCBS prior approval process reviewed.    PATIENT'S/FAMILY'S RESPONSE TO PLAN OF CARE: Pt expects to d/c to [facility] Thurs/Fri of this wee for ST rehab.    Cori Razor LCSW [phone]

## 2011-08-16 NOTE — Op Note (Signed)
[name], [name] NO.:  [id number]  MEDICAL RECORD NO.:  [id number]  LOCATION:  1616                         FACILITY:  [facility]  PHYSICIAN:  Madlyn Frankel. Charlann Boxer, M.D.  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  PROCEDURE:  Revision of right total hip replacement utilizing a DePuy revision system with a size 52, Gription Pinnacle cup a size 36+ 4 neutral AltrX liner, and a 36+ 1.5 delta ceramic ball.  SURGEON:  Madlyn Frankel. Charlann Boxer, M.D.  ASSISTANT:  Desiree Gins, PA-C.  Note that [name] was present for the entirety of the case from preoperative position, management of the right lower extremity throughout the case and general facilitation of the case, and primary wound closure.  ANESTHESIA:  Spinal.  DRAINS:  One medium Hemovac.  BLOOD LOSS:  About 200 mL.  COMPLICATIONS:  None.  FINDINGS:  The patient noted to have metal stents, synovial fluid, and synovial lining which was excised.  There were some metal debris within the bone and acetabulum but no evidence of any muscle damage or necrosis.  INDICATION OF PROCEDURE:  [name] is a 61 year old female with history of a right total hip replacement utilizing the DePuy ASR hip system.  This was chosen at that time due to a very small acetabular component size 46.  She had done well for number of years.  Monitoring her serum cobalt and chromium levels is recommended, however, we recently saw an increase in her levels associated with some increased pain.  After reviewing her options and discussing the options, she wished at this point proceed with a revision of right hip  surgery.  We reviewed the risks of infection, DVT, component failure, dislocation in the setting of revision surgery.  Discussed the duration of time in which it may take for body to clear the metal ions as well as postoperative followup.  She had already retained services of an attorney as part of class action lawsuit against DePuy, so her components would be saved, wants to preserve through pathology.  Consent was obtained for benefit of pain relief and to remove the metal components on the system.  PROCEDURE IN DETAIL:  The patient was brought to the operative theater. Once adequate anesthesia preoperative antibiotics, Ancef administered. She was positioned into the left lateral decubitus position on the right side up.  The right lower extremity was then prepped and draped in sterile fashion.  A time-out was performed identifying the patient, planned procedure, and extremity.  The patient's old incision was utilized and incised.  Soft tissue planes were created.  The iliotibial band and gluteal fascia were incised for posterior approach. Just underneath this fascia layer I encountered the bursal tissue which was obviously stained with metal debris.  This extended down to the posterior aspect of the joint.  We dissected this out circumferentially all the way down to  the joint itself.  It was saved and photographed for [name]'s viewing.  The hip was dislocated and femoral head was removed.  After further exposure of the trunnion, was attempted to place on the ilium, however, it did not want to remain on the ilium and due to the work on acetabulum, [employer] needed to retract the femur himself with the use of a bone hook.  At this point, the acetabular component was removed without significant bone loss at all using osteotomes.  There was evidence of some bony ingrowth mainly in the inferior anterior aspect of the hip.  Once the acetabular components removed, I debrided any  visible metal staining within the bone using a large curette.  I then reamed with a 45 reamer up to a 51 reamer with good bony preparation to preserve the posterior wall.  The 52mm Gription cup was chosen to enhance bony ingrowth as much as possible.  It was impacted at about 40 degrees of abduction. Significant improving her abduction angle as well as forward flexion of about 20 degrees.  I placed a single cancellous screw into the ilium to help support this initial scratch fit with good bite.  The hole eliminator was placed, and based on the cup position orientation, a 36+ 4 Neutral AltrX liner was chosen.  At this point, trial reduction was carried out with 36+ 1.5 ball.  I felt that the hip was very stable without evidence of impingement or subluxation with extension and external rotation as well forward flexion and internal rotation to 90 degrees.  Once these findings were reviewed, the hip was dislocated and the final 36, 1.5 ball was chosen and the Delta ceramic ball was impacted onto a clean and dried prepared trunnion and the hip was reduced.  We irrigated the hip throughout the case and then, at this point, I laid the capsular tissues up on some cells in the posterior aspect of the hip and then placed a medium Hemovac drain deep.  The iliotibial band and gluteal fascia were reapproximated using #1 Vicryl and 0 V-Loc.  The remainder of the wound was closed with 2-0 Vicryl and running 4-0 Monocryl.  The hip was cleaned, dried, and dressed sterilely with Dermabond and Aquacel dressing.  Drain site was dressed separately.  She was then brought to recovery room in stable condition, tolerating the procedure well.     Madlyn Frankel Charlann Boxer, M.D.     MDO/MEDQ  D:  08/15/2011  T:  08/16/2011  Job:  [id number]

## 2011-08-16 NOTE — Progress Notes (Signed)
Physical Therapy Treatment Patient Details Name: [name] MRN: [id number] DOB: [birth year] Today's Date: 08/16/2011 Time: 6295-2841 PT Time Calculation (min): 19 min  PT Assessment / Plan / Recommendation Comments on Treatment Session  Marked improvement in activity tolerance vs this am    Follow Up Recommendations  Skilled nursing facility    Barriers to Discharge        Equipment Recommendations  Defer to next venue    Recommendations for Other Services OT consult  Frequency 7X/week   Plan Discharge plan remains appropriate    Precautions / Restrictions Precautions Precautions: Posterior Hip Required Braces or Orthoses: Other Brace/Splint Other Brace/Splint: pt declines to use L LE brace Restrictions Weight Bearing Restrictions: No Other Position/Activity Restrictions: WBAT   Pertinent Vitals/Pain Min c/o pain.  BP prior to session 120/68; at session end 145/80    Mobility  Bed Mobility Bed Mobility: Supine to Sit Supine to Sit: HOB elevated;4: Min assist;3: Mod assist Sit to Supine: 3: Mod assist Details for Bed Mobility Assistance: assist required for BIl LEs Transfers Transfers: Sit to Stand;Stand to Sit Sit to Stand: 1: +2 Total assist Sit to Stand: Patient Percentage: 70% Stand to Sit: 1: +2 Total assist Stand to Sit: Patient Percentage: 70% Details for Transfer Assistance: elevated bed, pt relying heavily on operative LE for all mobility Ambulation/Gait Ambulation/Gait Assistance: 1: +2 Total assist Ambulation/Gait: Patient Percentage: 70% Ambulation Distance (Feet): 13 Feet Assistive device: Rolling walker Ambulation/Gait Assistance Details: min cues for posture and position from RW Gait Pattern: Step-to pattern;Decreased stance time - left    Exercises     PT Diagnosis:    PT Problem List:   PT Treatment Interventions:     PT Goals Acute Rehab PT Goals PT Goal Formulation: With patient Time For Goal Achievement: 08/22/11 Potential to  Achieve Goals: Good Pt will go Supine/Side to Sit: with min assist PT Goal: Supine/Side to Sit - Progress: Goal set today Pt will go Sit to Supine/Side: with min assist PT Goal: Sit to Supine/Side - Progress: Goal set today Pt will go Sit to Stand: with min assist PT Goal: Sit to Stand - Progress: Goal set today Pt will go Stand to Sit: with min assist PT Goal: Stand to Sit - Progress: Goal set today Pt will Ambulate: 16 - 50 feet;with min assist;with rolling walker PT Goal: Ambulate - Progress: Goal set today  Visit Information  Last PT Received On: 08/16/11 Assistance Needed: +2    Subjective Data  Subjective: I'll try to sit up but I don't know if I can walk Patient Stated Goal: Resume previous lifestyle with decreased pain   Cognition  Overall Cognitive Status: Appears within functional limits for tasks assessed/performed Arousal/Alertness: Awake/alert Orientation Level: Appears intact for tasks assessed Behavior During Session: [facility] for tasks performed    Balance     End of Session PT - End of Session Equipment Utilized During Treatment: Gait belt Activity Tolerance: Patient limited by fatigue Patient left: in bed;with call bell/phone within reach;with family/visitor present Nurse Communication: Mobility status   GP     Denver Bentson 08/16/2011, 2:48 PM

## 2011-08-17 LAB — BASIC METABOLIC PANEL
CO2: 25 mEq/L (ref 19–32)
Chloride: 108 mEq/L (ref 96–112)
Sodium: 140 mEq/L (ref 135–145)

## 2011-08-17 LAB — CBC
Platelets: 358 10*3/uL (ref 150–400)
RBC: 3.41 MIL/uL — ABNORMAL LOW (ref 3.87–5.11)
WBC: 12.2 10*3/uL — ABNORMAL HIGH (ref 4.0–10.5)

## 2011-08-17 NOTE — Evaluation (Signed)
Occupational Therapy Evaluation Patient Details Name: [name] MRN: [id number] DOB: 24-Nov-1950 Today's Date: 08/17/2011 Time: 1478-2956 OT Time Calculation (min): 33 min  OT Assessment / Plan / Recommendation Clinical Impression  This 61 year old female was admitted for R THA revision.  She has h/o polio which affects LLE.  She has THPs and is WBAT.  Pt will benefit from skilled OT to increase independence with ADLs with min to mod A goals in acute.    OT Assessment  Patient needs continued OT Services    Follow Up Recommendations  Skilled nursing facility    Barriers to Discharge      Equipment Recommendations  Defer to next venue    Recommendations for Other Services    Frequency  Min 2X/week    Precautions / Restrictions Precautions Precautions: Posterior Hip Precaution Comments: sign hung in room Required Braces or Orthoses: Other Brace/Splint Other Brace/Splint: pt declines to use L LE brace Restrictions Other Position/Activity Restrictions: WBAT   Pertinent Vitals/Pain No c/o pain; did get hot after transfer to 3:1--BP 114/72 sitting    ADL  Eating/Feeding: Simulated;Independent Where Assessed - Eating/Feeding: Chair Grooming: Simulated;Set up Where Assessed - Grooming: Supported sitting Upper Body Bathing: Simulated;Set up Where Assessed - Upper Body Bathing: Supported sitting Lower Body Bathing: Simulated;Minimal assistance (with AE) Where Assessed - Lower Body Bathing: Supported sit to stand Upper Body Dressing: Simulated;Set up Where Assessed - Upper Body Dressing: Supported sitting Lower Body Dressing: Performed;Maximal assistance (with AE (performed brace/shoes)) Where Assessed - Lower Body Dressing: Supported sit to stand Toilet Transfer: Performed;Minimal Dentist Method: Surveyor, minerals: Materials engineer and Hygiene: Performed;+1 Total assistance (could not release a hand in  standing) Where Assessed - Toileting Clothing Manipulation and Hygiene: Standing Transfers/Ambulation Related to ADLs: ambulated with PT ADL Comments: Pt got very hot after transferring to commode:  BP 114/72.  Pt wears compression hose on leg affected by polio. She is able to cross this leg and maintain THPs.  She has velcro sandals.  Will not need sock aid    OT Diagnosis: Generalized weakness  OT Problem List: Decreased strength;Decreased activity tolerance;Decreased knowledge of use of DME or AE;Decreased knowledge of precautions OT Treatment Interventions: Self-care/ADL training;Therapeutic activities;Patient/family education;DME and/or AE instruction   OT Goals Acute Rehab OT Goals OT Goal Formulation: With patient Time For Goal Achievement: 08/24/11 Potential to Achieve Goals: Good ADL Goals Pt Will Perform Grooming: with supervision;Standing at sink ADL Goal: Grooming - Progress: Goal set today Pt Will Perform Lower Body Bathing: with min assist;Sit to stand from chair;with adaptive equipment (min guard) ADL Goal: Lower Body Bathing - Progress: Goal set today Pt Will Perform Lower Body Dressing: with min assist;Sit to stand from chair;with adaptive equipment ADL Goal: Lower Body Dressing - Progress: Goal set today Pt Will Transfer to Toilet: with min assist;Ambulation;3-in-1;Maintaining hip precautions (min guard) ADL Goal: Toilet Transfer - Progress: Goal set today Pt Will Perform Toileting - Hygiene: with min assist;Standing at 3-in-1/toilet (min guard) ADL Goal: Toileting - Hygiene - Progress: Goal set today  Visit Information  Last OT Received On: 08/17/11 Assistance Needed: +1    Subjective Data  Subjective: I need to use the bathroom Patient Stated Goal: rehab   Prior Functioning  Vision/Perception  Home Living Lives With: Spouse Prior Function Level of Independence: Independent with assistive device(s) Communication Communication: No difficulties        Cognition  Overall Cognitive Status: Appears within functional  limits for tasks assessed/performed Behavior During Session: [facility] for tasks performed    Extremity/Trunk Assessment Right Upper Extremity Assessment RUE ROM/Strength/Tone: [facility] for tasks assessed Left Upper Extremity Assessment LUE ROM/Strength/Tone: [facility] for tasks assessed   Mobility Bed Mobility Sit to Supine: 4: Min assist;HOB elevated;With rail Transfers Sit to Stand: 4: Min assist   Exercise    Balance    End of Session OT - End of Session Activity Tolerance: Patient tolerated treatment well;Other (comment) (with rest breaks due to getting hot) Patient left: in chair;with call bell/phone within reach  GO     [facility] 08/17/2011, 4:30 PM Marica Otter, OTR/L [phone] 08/17/2011

## 2011-08-17 NOTE — Progress Notes (Signed)
Physical Therapy Treatment Patient Details Name: AALEYAH WITHEROW MRN: [id number] DOB: [birth year] Today's Date: 08/17/2011 Time: 4696-2952 PT Time Calculation (min): 32 min  PT Assessment / Plan / Recommendation Comments on Treatment Session  Marked improvement in activity tolerance vs yesterday    Follow Up Recommendations  Skilled nursing facility    Barriers to Discharge        Equipment Recommendations  Defer to next venue    Recommendations for Other Services OT consult  Frequency 7X/week   Plan Discharge plan remains appropriate    Precautions / Restrictions Precautions Precautions: Posterior Hip Precaution Comments: sign hung in room Required Braces or Orthoses: Other Brace/Splint Other Brace/Splint: pt declines to use L LE brace Restrictions Weight Bearing Restrictions: No Other Position/Activity Restrictions: WBAT   Pertinent Vitals/Pain Min c/o pain    Mobility  Bed Mobility Bed Mobility: Supine to Sit Supine to Sit: HOB elevated;4: Min assist Sit to Supine: 4: Min assist;HOB elevated;With rail Details for Bed Mobility Assistance: min assist Bil LEs Transfers Transfers: Sit to Stand;Stand to Sit Sit to Stand: 4: Min assist Stand to Sit: 4: Min assist;With armrests;To chair/3-in-1 Details for Transfer Assistance: pt pulling up on RW to stand - states she has to do use this technique because of L LE weakness Ambulation/Gait Ambulation/Gait Assistance: 4: Min assist Ambulation Distance (Feet): 68 Feet Assistive device: Rolling walker Ambulation/Gait Assistance Details: min cues for posture and ER on R Gait Pattern: Step-to pattern;Decreased stance time - left    Exercises     PT Diagnosis:    PT Problem List:   PT Treatment Interventions:     PT Goals Acute Rehab PT Goals PT Goal Formulation: With patient Time For Goal Achievement: 08/22/11 Potential to Achieve Goals: Good Pt will go Supine/Side to Sit: with min assist PT Goal: Supine/Side to Sit -  Progress: Progressing toward goal Pt will go Sit to Supine/Side: with min assist PT Goal: Sit to Supine/Side - Progress: Progressing toward goal Pt will go Sit to Stand: with min assist PT Goal: Sit to Stand - Progress: Progressing toward goal Pt will go Stand to Sit: with min assist PT Goal: Stand to Sit - Progress: Progressing toward goal Pt will Ambulate: 16 - 50 feet;with min assist;with rolling walker PT Goal: Ambulate - Progress: Progressing toward goal  Visit Information  Last PT Received On: 08/17/11 Assistance Needed: +1 PT/OT Co-Evaluation/Treatment: Yes    Subjective Data  Subjective: I've been doing my exercises in the bed Patient Stated Goal: Resume previous lifestyle with decreased pain   Cognition  Overall Cognitive Status: Appears within functional limits for tasks assessed/performed Arousal/Alertness: Awake/alert Orientation Level: Appears intact for tasks assessed Behavior During Session: [facility] for tasks performed    Balance     End of Session PT - End of Session Equipment Utilized During Treatment: Gait belt Activity Tolerance: Patient tolerated treatment well Patient left: in chair;with call bell/phone within reach;with family/visitor present Nurse Communication: Mobility status   GP     Aella Ronda 08/17/2011, 4:31 PM

## 2011-08-17 NOTE — Progress Notes (Signed)
CSW assisting with d/c planning. [facility]  is working with [employer] for prior approval . CSW will forward PT/ OT treatment notes to SNF when available.  Cori Razor LCSW [phone]

## 2011-08-17 NOTE — Progress Notes (Signed)
   Subjective: 2 Days Post-Op Procedure(s) (LRB): TOTAL HIP REVISION (Right)   Patient reports pain as mild.  Objective:   VITALS:   Filed Vitals:   08/17/11 0623  BP: 121/74  Pulse: 76  Temp: 98.4 F (36.9 C)  Resp: 14    Neurovascular intact Dorsiflexion/Plantar flexion intact Incision: dressing C/D/I No cellulitis present Compartment soft  LABS  Basename 08/17/11 0425 08/16/11 0417  HGB 10.9* 11.0*  HCT 32.7* 33.2*  WBC 12.2* 12.8*  PLT 358 337     Basename 08/17/11 0425 08/16/11 0417 08/15/11 0815  NA 140 137 141  K 4.3 3.8 4.1  BUN 13 10 13   CREATININE 0.60 0.64 0.71  GLUCOSE 133* 177* 107*     Assessment/Plan: 2 Days Post-Op Procedure(s) (LRB): TOTAL HIP REVISION (Right)   Foley cath d/c'ed Up with therapy Discharge to SNF tomorrow if continues on current course   Anastasio Auerbach. Gretel Cantu   PAC  08/17/2011, 9:12 AM

## 2011-08-17 NOTE — Progress Notes (Signed)
PT notes and OT eval have been forwarded to [facility] and will be provided to [facility] CM this afternoon for SNF prior approval. CSW will contact SNF in am to check authorization status.   Cori Razor LCSW [phone]

## 2011-08-18 MED ORDER — POLYETHYLENE GLYCOL 3350 17 G PO PACK: 17.0000 g | PACK | Freq: Every day | ORAL | Status: AC | PRN

## 2011-08-18 MED ORDER — DIPHENHYDRAMINE HCL 25 MG PO CAPS: 25.0000 mg | ORAL_CAPSULE | Freq: Four times a day (QID) | ORAL | Status: DC | PRN

## 2011-08-18 MED ORDER — DSS 100 MG PO CAPS: 100.0000 mg | ORAL_CAPSULE | Freq: Two times a day (BID) | ORAL | Status: AC

## 2011-08-18 MED ORDER — ASPIRIN EC 325 MG PO TBEC: 325.0000 mg | DELAYED_RELEASE_TABLET | Freq: Two times a day (BID) | ORAL | Status: AC

## 2011-08-18 MED ORDER — HYDROCODONE-ACETAMINOPHEN 7.5-325 MG PO TABS
1.0000 | ORAL_TABLET | ORAL | Status: AC | PRN
Start: 2011-08-18 — End: 2011-08-28

## 2011-08-18 MED ORDER — FERROUS SULFATE 325 (65 FE) MG PO TABS: 325.0000 mg | ORAL_TABLET | Freq: Three times a day (TID) | ORAL | Status: DC

## 2011-08-18 MED ORDER — METHOCARBAMOL 500 MG PO TABS: 500.0000 mg | ORAL_TABLET | Freq: Four times a day (QID) | ORAL | Status: AC | PRN

## 2011-08-18 NOTE — Progress Notes (Signed)
   Subjective: 3 Days Post-Op Procedure(s) (LRB): TOTAL HIP REVISION (Right)   Patient reports pain as mild, pain well controlled. No events throughout the night. Ready for discharge to SNF.  Objective:   VITALS:   Filed Vitals:   08/18/11 0539  BP: 117/74  Pulse: 69  Temp: 97.8 F (36.6 C)  Resp: 16    Neurovascular intact Dorsiflexion/Plantar flexion intact Incision: dressing C/D/I No cellulitis present Compartment soft  LABS  Basename 08/17/11 0425 08/16/11 0417  HGB 10.9* 11.0*  HCT 32.7* 33.2*  WBC 12.2* 12.8*  PLT 358 337     Basename 08/17/11 0425 08/16/11 0417  NA 140 137  K 4.3 3.8  BUN 13 10  CREATININE 0.60 0.64  GLUCOSE 133* 177*     Assessment/Plan: 3 Days Post-Op Procedure(s) (LRB): TOTAL HIP REVISION (Right)   Up with therapy Discharge to SNF Follow up in 2 weeks at [facility].  Follow-up Information    Follow up with OLIN,Corazon Nickolas D in 2 weeks.   Contact information:   [facility] [address], [address] [place] [address] [phone]           Anastasio Auerbach. Cherylanne Ardelean   PAC  08/18/2011, 8:26 AM

## 2011-08-18 NOTE — Discharge Summary (Signed)
Physician Discharge Summary  Patient ID: [name] MRN: [id number] DOB/AGE: [birth year] 62 y.o.  Admit date: 08/15/2011 Discharge date:  08/18/2011  Procedures:  Procedure(s) (LRB): TOTAL HIP REVISION (Right)  Attending Physician:  Dr. 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 female who initially had her left hip replaced on 01/09/2007 with an ASR style hip arthroplasty. She had originally done well but progressively she had more catching, pain and squeaking of the left hip. She had lab work obtained which revealed increased chromium and cobalt levels. X-rays in the clinic reveal an ASR hip replacement in proper position. Various options are discussed with the patient. Risks, benefits and expectations were discussed with the patient. Patient understand the risks, benefits and expectations and wishes to proceed with surgery. Patient has been clear by Dr. Clyde Canterbury as long as she was able to get respiratory clearance. She was seen by Dr. Delford Field who gave her respiratory clearance.  PCP: Elie Confer, MD   Discharged Condition: good  Hospital Course:  Patient underwent the above stated procedure on 08/15/2011. Patient tolerated the procedure well and brought to the recovery room in good condition and subsequently to the floor.  POD #1 BP: 101/64 ; Pulse: 82 ; Temp: 98 F (36.7 C) ; Resp: 14  [name] foley was removed, as well as the hemovac drain removed. IV was changed to a saline lock. Patient reports pain as mild, pain well controlled. No events throughout the night. Neurovascular intact, dorsiflexion/plantar flexion intact, incision: dressing C/D/I, no cellulitis present and  compartment soft.   LABS  Basename  08/16/11 0417   HGB  11.0  HCT  33.2    POD #2  BP: 121/74 ; Pulse: 76 ; Temp: 98.4 F (36.9 C) ; Resp: 14 Patient reports pain as mild,pain well controlled. No events throughout the night. Did well with PT. Neurovascular intact, dorsiflexion/plantar flexion intact, incision: dressing C/D/I, no cellulitis present and compartment soft.   LABS  Basename  08/17/11 0425   HGB  10.9  HCT  32.7   POD #3  BP: 117/74 ; Pulse: 69 ; Temp: 97.8 F (36.6 C) ; Resp: 16  Patient reports pain as mild, pain well controlled. No events throughout the night.  Little sore from PT the day prior.  Ready for discharge to SNF. Neurovascular intact, dorsiflexion/plantar flexion intact, incision: dressing C/D/I, no cellulitis present and compartment soft.   LABS   No new labs  Discharge Exam: General appearance: alert, cooperative and no distress Extremity: Homans sign is negative, no sign of DVT, no edema, redness or tenderness in the calves or thighs and no ulcers, gangrene or trophic changes  Disposition:   SNF with follow up in 2 weeks   Follow-up Information    Follow up with Shelda Pal, MD. Schedule an appointment as soon as possible for a visit in 2 weeks.   Contact information:   [facility] [address] [place] [address] [phone]          Discharge Orders    Future Orders Please Complete By Expires   Diet - low sodium heart healthy  Call MD / Call 911      Comments:   If you experience chest pain or shortness of breath, CALL 911 and be transported to the hospital emergency room.  If you develope a fever above 101 F, pus (white drainage) or increased drainage or redness at the wound, or calf pain, call your surgeon's office.   Discharge instructions      Comments:   Maintain surgical dressing for 8 days, then replace with gauze and tape. Keep the area dry and clean until follow up. Follow  up in 2 weeks at [facility]. Call with any questions or concerns.   Constipation Prevention      Comments:   Drink plenty of fluids.  Prune juice may be helpful.  You may use a stool softener, such as Colace (over the counter) 100 mg twice a day.  Use MiraLax (over the counter) for constipation as needed.   Increase activity slowly as tolerated      Driving restrictions      Comments:   No driving for 4 weeks   Change dressing      Comments:   Maintain surgical dressing for 8 days, then replace with 4x4 guaze and tape. Keep the area dry and clean.   TED hose      Comments:   Use stockings (TED hose) for 2 weeks on both leg(s).  You may remove them at night for sleeping.      Current Discharge Medication List    START taking these medications   Details  aspirin EC 325 MG tablet Take 1 tablet (325 mg total) by mouth 2 (two) times daily. X 4 weeks Qty: 60 tablet, Refills: 0    diphenhydrAMINE (BENADRYL) 25 mg capsule Take 1 capsule (25 mg total) by mouth every 6 (six) hours as needed for itching, allergies or sleep. Qty: 30 capsule    docusate sodium 100 MG CAPS Take 100 mg by mouth 2 (two) times daily. Qty: 10 capsule    ferrous sulfate 325 (65 FE) MG tablet Take 1 tablet (325 mg total) by mouth 3 (three) times daily after meals.    HYDROcodone-acetaminophen (NORCO) 7.5-325 MG per tablet Take 1-2 tablets by mouth every 4 (four) hours as needed for pain. Qty: 120 tablet, Refills: 0    methocarbamol (ROBAXIN) 500 MG tablet Take 1 tablet (500 mg total) by mouth every 6 (six) hours as needed (muscle spasms). Qty: 50 tablet, Refills: 0    polyethylene glycol (MIRALAX / GLYCOLAX) packet Take 17 g by mouth daily as needed. Qty: 14 each      CONTINUE these medications which have NOT CHANGED   Details  cholecalciferol (VITAMIN D) 1000 UNITS tablet Take 2,000 Units by mouth every morning.     Cyanocobalamin (VITAMIN B-12 CR) 1500 MCG TBCR Take 1 tablet by mouth daily.  1000mg  daily    Fluticasone-Salmeterol (ADVAIR) 250-50 MCG/DOSE AEPB Inhale 1 puff into the lungs 2 (two) times daily. Qty: 1 each, Refills: 6    levalbuterol (XOPENEX) 1.25 MG/3ML nebulizer solution as needed.    Loratadine 10 MG CAPS Take 1 capsule by mouth daily with breakfast.     montelukast (SINGULAIR) 10 MG tablet Take 10 mg by mouth at bedtime.     olmesartan-hydrochlorothiazide (BENICAR HCT) 40-25 MG per tablet Take 1 tablet by mouth daily with breakfast.     potassium chloride SA (K-DUR,KLOR-CON) 20 MEQ tablet Take 2 tablets by mouth daily. X 5 days    RABEprazole (ACIPHEX) 20  MG tablet Take 20 mg by mouth daily before breakfast.     mupirocin ointment (BACTROBAN) 2 % as directed. X 5 days      STOP taking these medications     diclofenac (VOLTAREN) 75 MG EC tablet Comments:  Reason for Stopping:           Signed: Anastasio Auerbach. Jager Koska   PAC  08/18/2011, 8:42 AM

## 2011-08-18 NOTE — Progress Notes (Signed)
Clinical Social Work Department CLINICAL SOCIAL WORK PLACEMENT NOTE 08/18/2011  Patient:  [name], [name]  Account Number:  [id number] Admit date:  08/15/2011  Clinical Social Worker:  Cori Razor, LCSW  Date/time:  08/16/2011 10:19 AM  Clinical Social Work is seeking post-discharge placement for this patient at the following level of care:   SKILLED NURSING   (*CSW will update this form in Epic as items are completed)     Patient/family provided with [facility] Department of Clinical Social Work's list of facilities offering this level of care within the geographic area requested by the patient (or if unable, by the patient's family).    Patient/family informed of their freedom to choose among providers that offer the needed level of care, that participate in Medicare, Medicaid or managed care program needed by the patient, have an available bed and are willing to accept the patient.    Patient/family informed of [facility]' ownership interest in [facility], as well as of the fact that they are under no obligation to receive care at this facility.  PASARR submitted to EDS on 08/15/2011 PASARR number received from [employer] on 08/15/2011  FL2 transmitted to all facilities in geographic area requested by pt/family on  08/16/2011 FL2 transmitted to all facilities within larger geographic area on   Patient informed that his/her managed care company has contracts with or will negotiate with  certain facilities, including the following:     Patient/family informed of bed offers received:  08/16/2011 Patient chooses bed at [facility] Physician recommends and patient chooses bed at    Patient to be transferred to [facility] on  08/18/2011 Patient to be transferred to facility by [facility]  The following physician request were entered in Epic:   Additional Comments: [employer] provided prior approval for SNF placement.  Cori Razor LCSW [phone]

## 2011-08-18 NOTE — Care Management Note (Signed)
    Page 1 of 2   08/18/2011     2:37:59 PM   CARE MANAGEMENT NOTE 08/18/2011  Patient:  [name], [name]   Account Number:  [id number]  Date Initiated:  08/18/2011  Documentation initiated by:  Colleen Can  Subjective/Objective Assessment:   dx failed rt total hip; revision of failed total hip     Action/Plan:   SNF rehab   Anticipated DC Date:  08/18/2011   Anticipated DC Plan:  SKILLED NURSING FACILITY  In-house referral  Clinical Social Worker      DC Planning Services  CM consult      [facility] Choice  NA   Choice offered to / List presented to:  NA   DME arranged  NA      DME agency  NA     HH arranged  NA      HH agency  NA   Status of service:  Completed, signed off Medicare Important Message given?  NA - LOS <3 / Initial given by admissions (If response is "NO", the following Medicare IM given date fields will be blank) Date Medicare IM given:   Date Additional Medicare IM given:    Discharge Disposition:  SKILLED NURSING FACILITY  Per UR Regulation:  Reviewed for med. necessity/level of care/duration of stay  If discussed at Long Length of Stay Meetings, dates discussed:    Comments:

## 2011-10-04 ENCOUNTER — Other Ambulatory Visit: Payer: Self-pay | Admitting: Nurse Practitioner

## 2011-10-04 DIAGNOSIS — Z780 Asymptomatic menopausal state: Secondary | ICD-10-CM

## 2011-10-04 DIAGNOSIS — Z1231 Encounter for screening mammogram for malignant neoplasm of breast: Secondary | ICD-10-CM

## 2011-10-28 ENCOUNTER — Ambulatory Visit
Admission: RE | Admit: 2011-10-28 | Discharge: 2011-10-28 | Disposition: A | Discharge status: Home / Self Care | Payer: BC Managed Care – PPO | Source: Ambulatory Visit | Attending: Nurse Practitioner | Admitting: Nurse Practitioner

## 2011-10-28 DIAGNOSIS — Z1231 Encounter for screening mammogram for malignant neoplasm of breast: Secondary | ICD-10-CM

## 2011-10-28 DIAGNOSIS — Z780 Asymptomatic menopausal state: Secondary | ICD-10-CM

## 2012-05-14 ENCOUNTER — Encounter: Payer: Self-pay | Admitting: *Deleted

## 2012-05-15 ENCOUNTER — Encounter: Payer: Self-pay | Admitting: Nurse Practitioner

## 2012-05-15 ENCOUNTER — Encounter: Payer: Self-pay | Admitting: *Deleted

## 2012-05-15 ENCOUNTER — Ambulatory Visit (INDEPENDENT_AMBULATORY_CARE_PROVIDER_SITE_OTHER): Payer: BC Managed Care – PPO | Admitting: Nurse Practitioner

## 2012-05-15 VITALS — BP 114/60 | HR 68 | Resp 12 | Ht 61.5 in | Wt 140.0 lb

## 2012-05-15 DIAGNOSIS — Z01419 Encounter for gynecological examination (general) (routine) without abnormal findings: Secondary | ICD-10-CM

## 2012-05-15 DIAGNOSIS — Z0000 Encounter for general adult medical examination without abnormal findings: Secondary | ICD-10-CM

## 2012-05-15 LAB — POCT URINALYSIS DIPSTICK
Urobilinogen, UA: NEGATIVE
pH, UA: 6

## 2012-05-15 NOTE — Progress Notes (Signed)
62 y.o. G2P2 Married Caucasian Fe here for annual exam.  No new GYN issues, except now having a recent increase in vaso symptoms. More during the day than at night. There has been an increase in stressors. Mother now 15 living with her and suffered a  fall in January with fracture of right humeral head in 4 places.  Husband had a mild stroke last year and another recently and now has lost vision of left eye.  Daughter has now moved to [place] and she will be going for a 5 day visit in near future.  Very excited and needs a break.  Husband will be caring for her mother.  No LMP recorded. Patient is postmenopausal.          Sexually active: yes  The current method of family planning is post menopausal status.    Exercising: yes  swimming Smoker:  no  Health Maintenance: Pap:  02/07/2011  Normal with negative HR HPV MMG:  10/2011 normal Colonoscopy:  11/2008 normal with recheck in 10 years BMD:   10/2011 Osteoporosis  T Score: Spine -1.9; Right forearm -3.1  (Ortho Surgeon believes this reading to be incorrect because of toxicity of the hip joint Zerita Boers that needed to be replaced, with toxins and cobalt level @  126 and chromium  26.4) TDaP:  01/2011 Labs: PCP does lab (blood) work.    reports that she has never smoked. She has never used smokeless tobacco. She reports that she drinks about 1.2 ounces of alcohol per week. She reports that she does not use illicit drugs.  Past Medical History  Diagnosis Date  . Allergic rhinitis   . Hyperlipemia   . GERD (gastroesophageal reflux disease)   . Polio   . Arthritis   . Unspecified essential hypertension     clearance Dr Everardo Beals with note on chart  . Asthma     Past Surgical History  Procedure Laterality Date  . Cesarean section      x 2  . Leg surgery      bilateral, numerous  . Total hip arthroplasty      right  . Wrist surgery  2009    left  . Carpal tunnel release      right  . Colon surgery      scar tissue removed  . Vocal  surgery      cyst removed  . Breast surgery      bilateral fibroid tumors removed  . Tooth implant      titanium/ front upper right  . Total hip revision  08/15/2011    Procedure: TOTAL HIP REVISION;  Surgeon: Shelda Pal, MD;  Location: [facility];  Service: Orthopedics;  Laterality: Right;    Current Outpatient Prescriptions  Medication Sig Dispense Refill  . cholecalciferol (VITAMIN D) 1000 UNITS tablet Take 2,000 Units by mouth every morning.       . Cyanocobalamin (VITAMIN B-12 CR) 1500 MCG TBCR Take 1 tablet by mouth daily. 1000mg  daily      . Fluticasone-Salmeterol (ADVAIR) 250-50 MCG/DOSE AEPB Inhale 1 puff into the lungs 2 (two) times daily.  1 each  6  . Loratadine 10 MG CAPS Take 1 capsule by mouth daily with breakfast.       . montelukast (SINGULAIR) 10 MG tablet Take 10 mg by mouth at bedtime.       Marland Kitchen olmesartan-hydrochlorothiazide (BENICAR HCT) 40-25 MG per tablet Take 1 tablet by mouth daily with breakfast.       .  RABEprazole (ACIPHEX) 20 MG tablet Take 20 mg by mouth daily before breakfast.       . diphenhydrAMINE (BENADRYL) 25 mg capsule Take 1 capsule (25 mg total) by mouth every 6 (six) hours as needed for itching, allergies or sleep.  30 capsule    . ferrous sulfate 325 (65 FE) MG tablet Take 1 tablet (325 mg total) by mouth 3 (three) times daily after meals.      Marland Kitchen levalbuterol (XOPENEX) 1.25 MG/3ML nebulizer solution as needed.      . mupirocin ointment (BACTROBAN) 2 % as directed. X 5 days      . potassium chloride SA (K-DUR,KLOR-CON) 20 MEQ tablet Take 2 tablets by mouth daily. X 5 days       No current facility-administered medications for this visit.    Family History  Problem Relation Age of Onset  . Kidney cancer Father   . Melanoma Mother     STAGE 3  . Breast cancer Mother   . Colon cancer Maternal Grandfather   . Breast cancer Maternal Grandmother     meta to stomach    ROS:  Pertinent items are noted in HPI.  Otherwise, a comprehensive ROS was  negative.  Exam:   BP 114/60  Pulse 68  Resp 12  Ht 5' 1.5" (1.562 m)  Wt 140 lb (63.504 kg)  BMI 26.03 kg/m2 Height: 5' 1.5" (156.2 cm)  Ht Readings from Last 3 Encounters:  05/15/12 5' 1.5" (1.562 m)  08/15/11 5\' 1"  (1.549 m)  08/15/11 5\' 1"  (1.549 m)    General appearance: alert, cooperative and appears stated age Head: Normocephalic, without obvious abnormality, atraumatic Neck: no adenopathy, supple, symmetrical, trachea midline and thyroid normal to inspection and palpation Lungs: clear to auscultation bilaterally Breasts: normal appearance, no masses or tenderness Heart: regular rate and rhythm Abdomen: soft, non-tender; no masses,  no organomegaly Extremities:  no cyanosis or edema, Leg brace and lift on left secondary to polio Skin: Skin color, texture, turgor normal. No rashes or lesions Lymph nodes: Cervical, supraclavicular, and axillary nodes normal. No abnormal inguinal nodes palpated Neurologic: Grossly normal   Pelvic: External genitalia:  no lesions              Urethra:  normal appearing urethra with no masses, tenderness or lesions              Bartholin's and Skene's: normal                 Vagina: normal appearing vagina with normal color and discharge, no lesions              Cervix: anteverted              Pap taken: no Bimanual Exam:  Uterus:  normal size, contour, position, consistency, mobility, non-tender              Adnexa: no mass, fullness, tenderness               Rectovaginal: Confirms               Anus:  normal sphincter tone, no lesions  A:  Well Woman with normal exam  Polio with left leg deformity and multiple surgeries and hip replacement with revision  Postmenopausal no HRT  PMB with negative PUS 03/2009 (unable to do biopsy secondary to stenosis)  Situational stressors  HTN, asthma    P:   Pap smear as per guidelines   Mammogram due 10/14  Encouragement given  return annually or prn  An After Visit Summary was printed and  given to the patient.

## 2012-05-15 NOTE — Patient Instructions (Signed)

## 2012-05-17 NOTE — Progress Notes (Signed)
Encounter reviewed by Dr. Ayra Hodgdon Silva.  

## 2012-06-19 ENCOUNTER — Other Ambulatory Visit: Payer: Self-pay | Admitting: Critical Care Medicine

## 2012-08-14 ENCOUNTER — Other Ambulatory Visit: Payer: Self-pay | Admitting: Critical Care Medicine

## 2012-08-14 NOTE — Telephone Encounter (Signed)
Pt last seen 08/05/11; was asked to f/u prn Received refill request for advair. No pending appts. Called, spoke with pt.  We have scheduled her for yearly f/u with PW on Aug 29. 2014 at 10:15 am at [facility] office.  Rx will be sent to last until OV. Pt aware.

## 2012-08-31 ENCOUNTER — Ambulatory Visit (INDEPENDENT_AMBULATORY_CARE_PROVIDER_SITE_OTHER): Payer: BC Managed Care – PPO | Admitting: Critical Care Medicine

## 2012-08-31 ENCOUNTER — Encounter: Payer: Self-pay | Admitting: Critical Care Medicine

## 2012-08-31 VITALS — BP 120/86 | HR 87 | Temp 98.3°F | Ht 61.0 in | Wt 142.0 lb

## 2012-08-31 DIAGNOSIS — J45909 Unspecified asthma, uncomplicated: Secondary | ICD-10-CM

## 2012-08-31 DIAGNOSIS — Z23 Encounter for immunization: Secondary | ICD-10-CM

## 2012-08-31 MED ORDER — FLUTICASONE-SALMETEROL 250-50 MCG/DOSE IN AEPB: INHALATION_SPRAY | RESPIRATORY_TRACT | Status: DC

## 2012-08-31 MED ORDER — RABEPRAZOLE SODIUM 20 MG PO TBEC: 20.0000 mg | DELAYED_RELEASE_TABLET | Freq: Every day | ORAL | Status: DC

## 2012-08-31 MED ORDER — LEVALBUTEROL HCL 1.25 MG/3ML IN NEBU: 1.2500 mg | INHALATION_SOLUTION | Freq: Four times a day (QID) | RESPIRATORY_TRACT | Status: DC | PRN

## 2012-08-31 NOTE — Progress Notes (Signed)
Subjective:    Patient ID: [name], female    DOB: 08-13-1950, 62 y.o.   MRN: [id number]  HPI   PUL ASTHMA HISTORY 08/31/2012 08/05/2011  Symptoms 0-2 days/week 0-2 days/week  Nighttime awakenings 0-2/month 0-2/month  Interference with activity No limitations No limitations  SABA use 0-2 days/wk 0-2 days/wk  Exacerbations requiring oral steroids 0-1 / year 0-1 / year  08/31/2012 Chief Complaint  Patient presents with  . Yearly follow up    Breathing doing well overall.  Does have chest congestion with clear mucus.  No SOB, chest tightness, or chest pain.    Pt will awakens some mucus then clears, this is an annual event.  No pred use in the past year.     Past Medical History  Diagnosis Date  . Allergic rhinitis   . Hyperlipemia   . GERD (gastroesophageal reflux disease)   . Polio   . Arthritis   . Unspecified essential hypertension     clearance Dr Everardo Beals with note on chart  . Asthma      Family History  Problem Relation Age of Onset  . Kidney cancer Father   . Melanoma Mother     STAGE 3  . Breast cancer Mother   . Colon cancer Maternal Grandfather   . Breast cancer Maternal Grandmother     meta to stomach     History   Social History  . Marital Status: Married    Spouse Name: N/A    Number of Children: 2  . Years of Education: N/A   Occupational History  . Music Minister     Pianoist  .     Social History Main Topics  . Smoking status: Never Smoker   . Smokeless tobacco: Never Used  . Alcohol Use: 1.2 oz/week    1 Cans of beer, 1 Glasses of wine per week  . Drug Use: No  . Sexual Activity: Yes    Birth Control/ Protection: Post-menopausal   Other Topics Concern  . Not on file   Social History Narrative  . No narrative on file     Allergies  Allergen Reactions  . Latex      Outpatient Prescriptions Prior to Visit  Medication Sig Dispense Refill  . cholecalciferol (VITAMIN D) 1000 UNITS tablet Take 2,000 Units by mouth every  morning.       . Cyanocobalamin (VITAMIN B-12 CR) 1500 MCG TBCR Take 1 tablet by mouth daily. 1000mg  daily      . diphenhydrAMINE (BENADRYL) 25 mg capsule Take 1 capsule (25 mg total) by mouth every 6 (six) hours as needed for itching, allergies or sleep.  30 capsule    . Loratadine 10 MG CAPS Take 1 capsule by mouth daily with breakfast.       . montelukast (SINGULAIR) 10 MG tablet Take 10 mg by mouth at bedtime.       Marland Kitchen ADVAIR DISKUS 250-50 MCG/DOSE AEPB USE 1 PUFF TWICE DAILY.  60 each  0  . levalbuterol (XOPENEX) 1.25 MG/3ML nebulizer solution as needed.      . RABEprazole (ACIPHEX) 20 MG tablet Take 20 mg by mouth daily before breakfast. And and dose if needed      . olmesartan-hydrochlorothiazide (BENICAR HCT) 40-25 MG per tablet Take 1 tablet by mouth daily with breakfast.        No facility-administered medications prior to visit.       Review of Systems  11 pt Ros neg except as per HPI  Objective:   Physical Exam  Filed Vitals:   08/31/12 1027  BP: 120/86  Pulse: 87  Temp: 98.3 F (36.8 C)  TempSrc: Oral  Height: 5\' 1"  (1.549 m)  Weight: 142 lb (64.411 kg)  SpO2: 96%    Gen: Pleasant, well-nourished, in no distress,  normal affect  ENT: No lesions,  mouth clear,  oropharynx clear, no postnasal drip  Neck: No JVD, no TMG, no carotid bruits  Lungs: No use of accessory muscles, no dullness to percussion, clear without rales or rhonchi  Cardiovascular: RRR, heart sounds normal, no murmur or gallops, no peripheral edema  Abdomen: soft and NT, no HSM,  BS normal  Musculoskeletal: No deformities, no cyanosis or clubbing  Neuro: alert, non focal  Skin: Warm, no lesions or rashes  No results found.        Assessment & Plan:   Moderate persistent asthma without complication Moderate persistent asthma improved Plan No change in inhaled or maintenance medications.      Updated Medication List Outpatient Encounter Prescriptions as of 08/31/2012   Medication Sig Dispense Refill  . cholecalciferol (VITAMIN D) 1000 UNITS tablet Take 2,000 Units by mouth every morning.       . Cyanocobalamin (VITAMIN B-12 CR) 1500 MCG TBCR Take 1 tablet by mouth daily. 1000mg  daily      . diphenhydrAMINE (BENADRYL) 25 mg capsule Take 1 capsule (25 mg total) by mouth every 6 (six) hours as needed for itching, allergies or sleep.  30 capsule    . Fluticasone-Salmeterol (ADVAIR DISKUS) 250-50 MCG/DOSE AEPB USE 1 PUFF TWICE DAILY.  60 each  11  . levalbuterol (XOPENEX) 1.25 MG/3ML nebulizer solution Take 1.25 mg by nebulization every 6 (six) hours as needed. Dx code 493.90  72 mL  6  . Loratadine 10 MG CAPS Take 1 capsule by mouth daily with breakfast.       . losartan-hydrochlorothiazide (HYZAAR) 100-25 MG per tablet Take 1 tablet by mouth daily.      . montelukast (SINGULAIR) 10 MG tablet Take 10 mg by mouth at bedtime.       . RABEprazole (ACIPHEX) 20 MG tablet Take 1 tablet (20 mg total) by mouth daily before breakfast. And second dose in the evening if needed  60 tablet  6  . [DISCONTINUED] ADVAIR DISKUS 250-50 MCG/DOSE AEPB USE 1 PUFF TWICE DAILY.  60 each  0  . [DISCONTINUED] levalbuterol (XOPENEX) 1.25 MG/3ML nebulizer solution as needed.      . [DISCONTINUED] losartan (COZAAR) 100 MG tablet Take 100 mg by mouth daily.      . [DISCONTINUED] RABEprazole (ACIPHEX) 20 MG tablet Take 20 mg by mouth daily before breakfast. And and dose if needed      . [DISCONTINUED] olmesartan-hydrochlorothiazide (BENICAR HCT) 40-25 MG per tablet Take 1 tablet by mouth daily with breakfast.        No facility-administered encounter medications on file as of 08/31/2012.

## 2012-08-31 NOTE — Patient Instructions (Addendum)
Flu vaccine today Advair refill given  Return 1 year

## 2012-09-01 NOTE — Assessment & Plan Note (Signed)
Moderate persistent asthma improved Plan No change in inhaled or maintenance medications.

## 2012-09-04 ENCOUNTER — Other Ambulatory Visit: Payer: Self-pay | Admitting: Critical Care Medicine

## 2012-11-27 ENCOUNTER — Other Ambulatory Visit: Payer: Self-pay

## 2012-11-27 DIAGNOSIS — Z1231 Encounter for screening mammogram for malignant neoplasm of breast: Secondary | ICD-10-CM

## 2013-01-07 ENCOUNTER — Ambulatory Visit
Admission: RE | Admit: 2013-01-07 | Discharge: 2013-01-07 | Disposition: A | Discharge status: Home / Self Care | Payer: BC Managed Care – PPO | Source: Ambulatory Visit

## 2013-01-07 DIAGNOSIS — Z1231 Encounter for screening mammogram for malignant neoplasm of breast: Secondary | ICD-10-CM

## 2013-01-10 ENCOUNTER — Other Ambulatory Visit: Payer: Self-pay | Admitting: Gastroenterology

## 2013-02-24 ENCOUNTER — Ambulatory Visit (INDEPENDENT_AMBULATORY_CARE_PROVIDER_SITE_OTHER): Payer: BC Managed Care – PPO | Admitting: Emergency Medicine

## 2013-02-24 ENCOUNTER — Encounter (HOSPITAL_COMMUNITY): Payer: Self-pay | Admitting: Emergency Medicine

## 2013-02-24 ENCOUNTER — Ambulatory Visit: Payer: BC Managed Care – PPO

## 2013-02-24 ENCOUNTER — Emergency Department (HOSPITAL_COMMUNITY)
Admission: EM | Admit: 2013-02-24 | Discharge: 2013-02-25 | Disposition: A | Discharge status: Home / Self Care | Payer: BC Managed Care – PPO | Attending: Emergency Medicine | Admitting: Emergency Medicine

## 2013-02-24 VITALS — BP 120/80 | HR 113 | Temp 97.9°F | Resp 16 | Ht 62.0 in | Wt 142.0 lb

## 2013-02-24 DIAGNOSIS — R05 Cough: Secondary | ICD-10-CM

## 2013-02-24 DIAGNOSIS — M129 Arthropathy, unspecified: Secondary | ICD-10-CM | POA: Insufficient documentation

## 2013-02-24 DIAGNOSIS — R51 Headache: Secondary | ICD-10-CM

## 2013-02-24 DIAGNOSIS — I10 Essential (primary) hypertension: Secondary | ICD-10-CM | POA: Insufficient documentation

## 2013-02-24 DIAGNOSIS — J069 Acute upper respiratory infection, unspecified: Secondary | ICD-10-CM | POA: Insufficient documentation

## 2013-02-24 DIAGNOSIS — Z862 Personal history of diseases of the blood and blood-forming organs and certain disorders involving the immune mechanism: Secondary | ICD-10-CM | POA: Insufficient documentation

## 2013-02-24 DIAGNOSIS — R059 Cough, unspecified: Secondary | ICD-10-CM

## 2013-02-24 DIAGNOSIS — J019 Acute sinusitis, unspecified: Secondary | ICD-10-CM | POA: Insufficient documentation

## 2013-02-24 DIAGNOSIS — K219 Gastro-esophageal reflux disease without esophagitis: Secondary | ICD-10-CM | POA: Insufficient documentation

## 2013-02-24 DIAGNOSIS — J45909 Unspecified asthma, uncomplicated: Secondary | ICD-10-CM | POA: Insufficient documentation

## 2013-02-24 DIAGNOSIS — Z9109 Other allergy status, other than to drugs and biological substances: Secondary | ICD-10-CM | POA: Insufficient documentation

## 2013-02-24 DIAGNOSIS — Z91040 Latex allergy status: Secondary | ICD-10-CM | POA: Insufficient documentation

## 2013-02-24 DIAGNOSIS — R509 Fever, unspecified: Secondary | ICD-10-CM

## 2013-02-24 DIAGNOSIS — Z79899 Other long term (current) drug therapy: Secondary | ICD-10-CM | POA: Insufficient documentation

## 2013-02-24 DIAGNOSIS — Z8639 Personal history of other endocrine, nutritional and metabolic disease: Secondary | ICD-10-CM | POA: Insufficient documentation

## 2013-02-24 DIAGNOSIS — IMO0002 Reserved for concepts with insufficient information to code with codable children: Secondary | ICD-10-CM | POA: Insufficient documentation

## 2013-02-24 DIAGNOSIS — J329 Chronic sinusitis, unspecified: Secondary | ICD-10-CM

## 2013-02-24 LAB — CBC WITH DIFFERENTIAL/PLATELET
Basophils Absolute: 0 10*3/uL (ref 0.0–0.1)
Basophils Relative: 0 % (ref 0–1)
EOS ABS: 0.1 10*3/uL (ref 0.0–0.7)
EOS PCT: 1 % (ref 0–5)
HEMATOCRIT: 38.4 % (ref 36.0–46.0)
Hemoglobin: 13 g/dL (ref 12.0–15.0)
LYMPHS ABS: 1.9 10*3/uL (ref 0.7–4.0)
LYMPHS PCT: 17 % (ref 12–46)
MCH: 31.4 pg (ref 26.0–34.0)
MCHC: 33.9 g/dL (ref 30.0–36.0)
MCV: 92.8 fL (ref 78.0–100.0)
MONO ABS: 1.4 10*3/uL — AB (ref 0.1–1.0)
MONOS PCT: 12 % (ref 3–12)
Neutro Abs: 8.1 10*3/uL — ABNORMAL HIGH (ref 1.7–7.7)
Neutrophils Relative %: 70 % (ref 43–77)
PLATELETS: 375 10*3/uL (ref 150–400)
RBC: 4.14 MIL/uL (ref 3.87–5.11)
RDW: 12.9 % (ref 11.5–15.5)
WBC: 11.6 10*3/uL — AB (ref 4.0–10.5)

## 2013-02-24 LAB — POCT CBC
Granulocyte percent: 75.1 %G (ref 37–80)
HCT, POC: 41.4 % (ref 37.7–47.9)
HEMOGLOBIN: 13 g/dL (ref 12.2–16.2)
LYMPH, POC: 1.8 (ref 0.6–3.4)
MCH: 31.4 pg — AB (ref 27–31.2)
MCHC: 31.4 g/dL — AB (ref 31.8–35.4)
MCV: 99.9 fL — AB (ref 80–97)
MID (CBC): 0.8 (ref 0–0.9)
MPV: 7.9 fL (ref 0–99.8)
PLATELET COUNT, POC: 374 10*3/uL (ref 142–424)
POC Granulocyte: 7.8 — AB (ref 2–6.9)
POC LYMPH PERCENT: 17.1 %L (ref 10–50)
POC MID %: 7.8 % (ref 0–12)
RBC: 4.14 M/uL (ref 4.04–5.48)
RDW, POC: 13.4 %
WBC: 10.4 10*3/uL — AB (ref 4.6–10.2)

## 2013-02-24 LAB — COMPREHENSIVE METABOLIC PANEL
ALT: 33 U/L (ref 0–35)
AST: 32 U/L (ref 0–37)
Albumin: 3.4 g/dL — ABNORMAL LOW (ref 3.5–5.2)
Alkaline Phosphatase: 116 U/L (ref 39–117)
BUN: 7 mg/dL (ref 6–23)
CALCIUM: 9.6 mg/dL (ref 8.4–10.5)
CO2: 29 meq/L (ref 19–32)
CREATININE: 0.69 mg/dL (ref 0.50–1.10)
Chloride: 93 mEq/L — ABNORMAL LOW (ref 96–112)
GLUCOSE: 126 mg/dL — AB (ref 70–99)
Potassium: 4.9 mEq/L (ref 3.7–5.3)
SODIUM: 137 meq/L (ref 137–147)
TOTAL PROTEIN: 7.7 g/dL (ref 6.0–8.3)
Total Bilirubin: 0.5 mg/dL (ref 0.3–1.2)

## 2013-02-24 LAB — POCT INFLUENZA A/B
Influenza A, POC: NEGATIVE
Influenza B, POC: NEGATIVE

## 2013-02-24 MED ORDER — LEVOFLOXACIN 750 MG PO TABS: 750.0000 mg | ORAL_TABLET | Freq: Every day | ORAL | Status: DC

## 2013-02-24 MED ORDER — ONDANSETRON HCL 4 MG/2ML IJ SOLN
4.0000 mg | Freq: Once | INTRAMUSCULAR | Status: AC
  Administered 2013-02-24: 4 mg via INTRAVENOUS
  Filled 2013-02-24: qty 2

## 2013-02-24 MED ORDER — HYDROCODONE-ACETAMINOPHEN 5-325 MG PO TABS: 1.0000 | ORAL_TABLET | ORAL | Status: DC | PRN

## 2013-02-24 MED ORDER — ACETAMINOPHEN 325 MG PO TABS
650.0000 mg | ORAL_TABLET | Freq: Once | ORAL | Status: AC
  Administered 2013-02-24: 650 mg via ORAL
  Filled 2013-02-24: qty 2

## 2013-02-24 MED ORDER — FLUTICASONE PROPIONATE 50 MCG/ACT NA SUSP: 1.0000 | Freq: Every day | NASAL | Status: DC

## 2013-02-24 MED ORDER — SODIUM CHLORIDE 0.9 % IV BOLUS (SEPSIS)
1000.0000 mL | Freq: Once | INTRAVENOUS | Status: AC
  Administered 2013-02-24: 1000 mL via INTRAVENOUS

## 2013-02-24 MED ORDER — MORPHINE SULFATE 4 MG/ML IJ SOLN
4.0000 mg | Freq: Once | INTRAMUSCULAR | Status: AC
  Administered 2013-02-24: 4 mg via INTRAVENOUS
  Filled 2013-02-24: qty 1

## 2013-02-24 MED ORDER — LEVOFLOXACIN 500 MG PO TABS
750.0000 mg | ORAL_TABLET | Freq: Once | ORAL | Status: AC
  Administered 2013-02-24: 750 mg via ORAL
  Filled 2013-02-24: qty 2

## 2013-02-24 NOTE — ED Provider Notes (Signed)
CSN: [id number]     Arrival date & time 02/24/13  1539 History   First MD Initiated Contact with Patient 02/24/13 1644     Chief Complaint  Patient presents with  . Sinusitis     (Consider location/radiation/quality/duration/timing/severity/associated sxs/prior Treatment) HPI [name] is a 63 y.o. female who presents to ED with complaint of sinus congestion, sinus pressure, left ear pain for last 5 days. Sates cough that began today. She has history of polio with post polio syndrome, and reports frequent infections and pneumonias. Pt states today she went to an urgent care, there slightly elevated WBC, was found to have elevated HR, sinus opacifications on sinus x-ray films and normal CXR. Was sent here for sinusitis and dehydration.   Past Medical History  Diagnosis Date  . Allergic rhinitis   . Hyperlipemia   . GERD (gastroesophageal reflux disease)   . Polio   . Arthritis   . Unspecified essential hypertension     clearance Dr Everardo Beals with note on chart  . Asthma    Past Surgical History  Procedure Laterality Date  . Cesarean section      x 2  . Leg surgery      bilateral, numerous  . Total hip arthroplasty      right  . Wrist surgery  2009    left  . Carpal tunnel release      right  . Colon surgery      scar tissue removed  . Vocal surgery      cyst removed  . Breast surgery      bilateral fibroid tumors removed  . Tooth implant      titanium/ front upper right  . Total hip revision  08/15/2011    Procedure: TOTAL HIP REVISION;  Surgeon: Shelda Pal, MD;  Location: [facility];  Service: Orthopedics;  Laterality: Right;   Family History  Problem Relation Age of Onset  . Kidney cancer Father   . Melanoma Mother     STAGE 3  . Breast cancer Mother   . Colon cancer Maternal Grandfather   . Breast cancer Maternal Grandmother     meta to stomach   History  Substance Use Topics  . Smoking status: Never Smoker   . Smokeless tobacco: Never Used  . Alcohol  Use: 1.2 oz/week    1 Cans of beer, 1 Glasses of wine per week   OB History   Grav Para Term Preterm Abortions TAB SAB Ect Mult Living   2 2             Review of Systems  Constitutional: Positive for fever and chills.  HENT: Positive for congestion, ear pain, postnasal drip and sinus pressure. Negative for trouble swallowing and voice change.   Respiratory: Positive for cough. Negative for chest tightness and shortness of breath.   Cardiovascular: Negative for chest pain, palpitations and leg swelling.  Gastrointestinal: Negative for nausea, vomiting, abdominal pain and diarrhea.  Genitourinary: Negative for dysuria, flank pain, vaginal bleeding, vaginal discharge, vaginal pain and pelvic pain.  Musculoskeletal: Negative for arthralgias, myalgias, neck pain and neck stiffness.  Skin: Negative for rash.  Neurological: Negative for dizziness, weakness and headaches.  All other systems reviewed and are negative.      Allergies  Latex  Home Medications   Current Outpatient Rx  Name  Route  Sig  Dispense  Refill  . cholecalciferol (VITAMIN D) 1000 UNITS tablet   Oral   Take 2,000 Units by  mouth every morning.          . Cyanocobalamin (VITAMIN B-12 CR) 1500 MCG TBCR   Oral   Take 1 tablet by mouth daily. 1000mg  daily         . dextromethorphan (DELSYM) 30 MG/5ML liquid   Oral   Take 30 mg by mouth as needed for cough (cough).         . Fluticasone-Salmeterol (ADVAIR DISKUS) 250-50 MCG/DOSE AEPB      USE 1 PUFF TWICE DAILY.   60 each   11   . HYDROcodone-acetaminophen (NORCO/VICODIN) 5-325 MG per tablet   Oral   Take 1 tablet by mouth every 6 (six) hours as needed for moderate pain (pain).         Marland Kitchen loratadine (CLARITIN) 10 MG tablet   Oral   Take 10 mg by mouth daily.         Marland Kitchen losartan-hydrochlorothiazide (HYZAAR) 100-25 MG per tablet   Oral   Take 1 tablet by mouth daily.         . montelukast (SINGULAIR) 10 MG tablet      TAKE 1 TABLET IN THE  P.M.   30 tablet   11   . RABEprazole (ACIPHEX) 20 MG tablet      TAKE 1 TABLET TWICE DAILY, 30 MINUTES BEFORE BREAKFAST AND AT BEDTIME.   60 tablet   3   . vitamin C (ASCORBIC ACID) 250 MG tablet   Oral   Take 250 mg by mouth daily.          BP 149/96  Pulse 113  Temp(Src) 100 F (37.8 C) (Oral)  Resp 16  SpO2 94% Physical Exam  Nursing note and vitals reviewed. Constitutional: She is oriented to person, place, and time. She appears well-developed and well-nourished. No distress.  HENT:  Head: Normocephalic and atraumatic.  Right Ear: External ear normal.  Left Ear: External ear normal. Tympanic membrane is injected and erythematous.  Nose: Mucosal edema and rhinorrhea present. Right sinus exhibits maxillary sinus tenderness. Left sinus exhibits maxillary sinus tenderness.  Mouth/Throat: Uvula is midline, oropharynx is clear and moist and mucous membranes are normal.  Eyes: Conjunctivae are normal.  Neck: Neck supple.  Cardiovascular: Normal rate, regular rhythm and normal heart sounds.   Pulmonary/Chest: Effort normal and breath sounds normal. No respiratory distress. She has no wheezes. She has no rales.  Abdominal: Soft. Bowel sounds are normal. She exhibits no distension. There is no tenderness. There is no rebound.  Musculoskeletal: She exhibits no edema.  Neurological: She is alert and oriented to person, place, and time.  Skin: Skin is warm and dry.  Psychiatric: She has a normal mood and affect. Her behavior is normal.    ED Course  Procedures (including critical care time) Labs Review Labs Reviewed  CBC WITH DIFFERENTIAL  COMPREHENSIVE METABOLIC PANEL   Imaging Review Dg Sinus 1-2 Views  02/24/2013   CLINICAL DATA:  Fever headache.  Sinusitis.  EXAM: PARANASAL SINUSES - 1-2 VIEW  COMPARISON:  None.  FINDINGS: Near complete opacification of both maxillary sinuses is seen. Probable opacification of the ethmoid sinuses all seen bilaterally. The frontal  sinuses are non-developed. No bone abnormality identified.  IMPRESSION: Near complete opacification of both maxillary sinuses. Probable bilateral ethmoid sinus opacification also noted.   Electronically Signed   By: Earle Gell M.D.   On: 02/24/2013 15:10   Dg Chest 2 View  02/24/2013   CLINICAL DATA:  Cough.  Fever.  EXAM: CHEST  2 VIEW  COMPARISON:  08/03/2011  FINDINGS: Mild scarring noted in the left lung base. No evidence acute infiltrate or edema. No evidence of pleural effusion. Heart size and mediastinal contours are normal.  IMPRESSION: Mild left basilar scarring.  No active disease.   Electronically Signed   By: Earle Gell M.D.   On: 02/24/2013 15:11    EKG Interpretation   None       MDM   Final diagnoses:  Sinusitis, acute  URI (upper respiratory infection)    Pt is well appearing with sinus pressure and nasal congestion. Cough that began today. Pt has no nausea, vomiting. Her temp here is 100 orally. Discussed with Dr. Winfred Leeds who has seen pt as well. Clinically, she does have sinusitis. Additionally x-ray films showed sinus disease at [facility] today. cxr was clean. Will start IV fluids for rehydration. Tylenol for low grade fever. levaquin orally. Will recheck.    6:51 PM Patient's vital signs improved with IV fluids. Her pain is resolved with IV morphine 4 mg. She's feeling much better now. I have given her oral Levaquin 750 mg for her sinus infection. I this time patient is nontoxic appearing. Discussed going home versus admission, patient is comfortable with going home.  Instructed to return or followup with her Dr. for symptoms not improving or worsening. Patient voiced understanding.  Filed Vitals:   02/24/13 1545 02/24/13 1825  BP: 149/96 123/75  Pulse: 113 96  Temp: 100 F (37.8 C) 99.6 F (37.6 C)  TempSrc: Oral Oral  Resp: 16 16  SpO2: 94% 95%      Renold Genta, PA-C 02/24/13 1853

## 2013-02-24 NOTE — ED Notes (Signed)
Patient is resting comfortably. 

## 2013-02-24 NOTE — Progress Notes (Signed)
   Subjective:    Patient ID: [name], female    DOB: [birth year], 63 y.o.   MRN: [id number]  HPI patient states she has been ill all week. She has a history of severe sinus problems typically treats this with the sinus flush. She usually flushes her sinuses every 10 hours. For the last 2 days she has had severe head congestion severe frontal headache and bleeding her sinuses. She has had fever off and on. She has a history of asthma and had some mild cough but no true wheezing.    Review of Systems     Objective:   Physical Exam patient is ill-appearing but nontoxic. She is laying on the exam table. She is able to sit up but does have some dizziness on going from lying to sitting. TMs were clear. There is severe crusting and purulence in both sides of the nose. The throat is normal. Her chest was clear to auscultation and percussion.  [facility] reading (PRIMARY) by  Dr. Everlene Farrier Chest x-ray shows no acute disease there is bilateral clouding of the maxillary sinuses worse on the right nose frontal sinus is visible. There may also be ethmoid sinus disease  Results for orders placed in visit on 02/24/13  POCT CBC      Result Value Ref Range   WBC 10.4 (*) 4.6 - 10.2 K/uL   Lymph, poc 1.8  0.6 - 3.4   POC LYMPH PERCENT 17.1  10 - 50 %L   MID (cbc) 0.8  0 - 0.9   POC MID % 7.8  0 - 12 %M   POC Granulocyte 7.8 (*) 2 - 6.9   Granulocyte percent 75.1  37 - 80 %G   RBC 4.14  4.04 - 5.48 M/uL   Hemoglobin 13.0  12.2 - 16.2 g/dL   HCT, POC 41.4  37.7 - 47.9 %   MCV 99.9 (*) 80 - 97 fL   MCH, POC 31.4 (*) 27 - 31.2 pg   MCHC 31.4 (*) 31.8 - 35.4 g/dL   RDW, POC 13.4     Platelet Count, POC 374  142 - 424 K/uL   MPV 7.9  0 - 99.8 fL  POCT INFLUENZA A/B      Result Value Ref Range   Influenza A, POC Negative     Influenza B, POC Negative          Assessment & Plan:  The patient's x-rays show a severe pan sinusitis involving the maxillary and ethmoid sinuses. Patient appears dehydrated  clinically she has post polio syndrome with a history of asthma and his risk for respiratory complications. I've advised her to go to [facility] for further evaluation and consideration for admission for IV antibiotics and IV fluids. I called triage and they aware of the situation and I gave them a number [phone] for a question

## 2013-02-24 NOTE — Discharge Instructions (Signed)
Continue levaquin for infection. Next dose tomorrow. Flonase daily. Use saline rinses daily. Norco for pain. You can also take ibuprofen for pain and fever regularly. Follow up with your doctor. Return if worsening.   Sinusitis Sinusitis is redness, soreness, and swelling (inflammation) of the paranasal sinuses. Paranasal sinuses are air pockets within the bones of your face (beneath the eyes, the middle of the forehead, or above the eyes). In healthy paranasal sinuses, mucus is able to drain out, and air is able to circulate through them by way of your nose. However, when your paranasal sinuses are inflamed, mucus and air can become trapped. This can allow bacteria and other germs to grow and cause infection. Sinusitis can develop quickly and last only a short time (acute) or continue over a long period (chronic). Sinusitis that lasts for more than 12 weeks is considered chronic.  CAUSES  Causes of sinusitis include:  Allergies.  Structural abnormalities, such as displacement of the cartilage that separates your nostrils (deviated septum), which can decrease the air flow through your nose and sinuses and affect sinus drainage.  Functional abnormalities, such as when the small hairs (cilia) that line your sinuses and help remove mucus do not work properly or are not present. SYMPTOMS  Symptoms of acute and chronic sinusitis are the same. The primary symptoms are pain and pressure around the affected sinuses. Other symptoms include:  Upper toothache.  Earache.  Headache.  Bad breath.  Decreased sense of smell and taste.  A cough, which worsens when you are lying flat.  Fatigue.  Fever.  Thick drainage from your nose, which often is green and may contain pus (purulent).  Swelling and warmth over the affected sinuses. DIAGNOSIS  Your caregiver will perform a physical exam. During the exam, your caregiver may:  Look in your nose for signs of abnormal growths in your nostrils (nasal  polyps).  Tap over the affected sinus to check for signs of infection.  View the inside of your sinuses (endoscopy) with a special imaging device with a light attached (endoscope), which is inserted into your sinuses. If your caregiver suspects that you have chronic sinusitis, one or more of the following tests may be recommended:  Allergy tests.  Nasal culture A sample of mucus is taken from your nose and sent to a lab and screened for bacteria.  Nasal cytology A sample of mucus is taken from your nose and examined by your caregiver to determine if your sinusitis is related to an allergy. TREATMENT  Most cases of acute sinusitis are related to a viral infection and will resolve on their own within 10 days. Sometimes medicines are prescribed to help relieve symptoms (pain medicine, decongestants, nasal steroid sprays, or saline sprays).  However, for sinusitis related to a bacterial infection, your caregiver will prescribe antibiotic medicines. These are medicines that will help kill the bacteria causing the infection.  Rarely, sinusitis is caused by a fungal infection. In theses cases, your caregiver will prescribe antifungal medicine. For some cases of chronic sinusitis, surgery is needed. Generally, these are cases in which sinusitis recurs more than 3 times per year, despite other treatments. HOME CARE INSTRUCTIONS   Drink plenty of water. Water helps thin the mucus so your sinuses can drain more easily.  Use a humidifier.  Inhale steam 3 to 4 times a day (for example, sit in the bathroom with the shower running).  Apply a warm, moist washcloth to your face 3 to 4 times a day, or as  directed by your caregiver.  Use saline nasal sprays to help moisten and clean your sinuses.  Take over-the-counter or prescription medicines for pain, discomfort, or fever only as directed by your caregiver. SEEK IMMEDIATE MEDICAL CARE IF:  You have increasing pain or severe headaches.  You have  nausea, vomiting, or drowsiness.  You have swelling around your face.  You have vision problems.  You have a stiff neck.  You have difficulty breathing. MAKE SURE YOU:   Understand these instructions.  Will watch your condition.  Will get help right away if you are not doing well or get worse. Document Released: 12/20/2004 Document Revised: 03/14/2011 Document Reviewed: 01/04/2011 [facility] Patient Information 2014 [place], [place].

## 2013-02-24 NOTE — ED Provider Notes (Signed)
Complains sinus congestion at bilateral maxillary sinuses for approximately 4 days. Also admits to mild generalized weakness. No fever. No other complaint. Seen at urgent care center this morning sent here for further evaluation. On exam patient is not acutely ill appearing HEENT exam no facial asymmetry oral pharynx minimally reddened neck supple no adenopathy lungs clear auscultation heart regular rate and rhythm   Orlie Dakin, MD 02/24/13 1724

## 2013-02-24 NOTE — ED Provider Notes (Signed)
Medical screening examination/treatment/procedure(s) were conducted as a shared visit with non-physician practitioner(s) and myself.  I personally evaluated the patient during the encounter.  EKG Interpretation   None        Orlie Dakin, MD 02/24/13 2351

## 2013-02-24 NOTE — Patient Instructions (Addendum)
Please go to [facility] for further evaluation for your severe pan sinusitis and dehydration. They will evaluate you regarding IV antibiotics and possible admission and possible CT of the sinuses. Patient with a 10 out of 10 headache ear feels probably secondary to her sinusitis

## 2013-02-24 NOTE — ED Notes (Signed)
Pt states seen at urgent care for sinus congestion, head pressure.  Pt went to urgent care.  Per MD there, pt has severe sinus infection and needs IV antibiotics.

## 2013-08-06 ENCOUNTER — Encounter: Payer: Self-pay | Admitting: Adult Health

## 2013-08-06 ENCOUNTER — Ambulatory Visit (INDEPENDENT_AMBULATORY_CARE_PROVIDER_SITE_OTHER): Payer: BC Managed Care – PPO | Admitting: Adult Health

## 2013-08-06 VITALS — BP 124/78 | HR 95 | Temp 97.6°F | Ht 61.0 in | Wt 144.0 lb

## 2013-08-06 DIAGNOSIS — J4540 Moderate persistent asthma, uncomplicated: Secondary | ICD-10-CM

## 2013-08-06 DIAGNOSIS — J45909 Unspecified asthma, uncomplicated: Secondary | ICD-10-CM

## 2013-08-06 MED ORDER — RABEPRAZOLE SODIUM 20 MG PO TBEC: DELAYED_RELEASE_TABLET | ORAL | Status: DC

## 2013-08-06 MED ORDER — PREDNISONE 10 MG PO TABS: ORAL_TABLET | ORAL | Status: DC

## 2013-08-06 MED ORDER — LEVALBUTEROL HCL 0.63 MG/3ML IN NEBU
0.6300 mg | INHALATION_SOLUTION | Freq: Once | RESPIRATORY_TRACT | Status: AC
  Administered 2013-08-06: 0.63 mg via RESPIRATORY_TRACT

## 2013-08-06 MED ORDER — AZITHROMYCIN 250 MG PO TABS: ORAL_TABLET | ORAL | Status: AC

## 2013-08-06 NOTE — Assessment & Plan Note (Signed)
Flare with Bronchitis  xopenex neb x1   Plan  Zpack take as directed.  Mucinex DM Twice daily  As needed  Cough/congestion  Saline nasal rinses As needed   Prednisone taper over next week.  May use Advair 500 until sample is gone then resume Advair 250  Please contact office for sooner follow up if symptoms do not improve or worsen or seek emergency care  Follow up Dr. Joya Gaskins 4 weeks and As needed

## 2013-08-06 NOTE — Patient Instructions (Signed)
Zpack take as directed.  Mucinex DM Twice daily  As needed  Cough/congestion  Saline nasal rinses As needed   Prednisone taper over next week.  May use Advair 500 until sample is gone then resume Advair 250  Please contact office for sooner follow up if symptoms do not improve or worsen or seek emergency care  Follow up Dr. Joya Gaskins 4 weeks and As needed

## 2013-08-06 NOTE — Progress Notes (Signed)
   Subjective:    Patient ID: [name], female    DOB: [birth year], 63 y.o.   MRN: [id number]  HPI 63 yo with known hx of moderate persistent asthma and chronic rhinitis .   08/06/2013 Acute OV  Complains of prod cough with green mucus since yesterday, PND, sinus congestion, sore throat for 1 week.  Is requesting some abx since she is going out of town this weekend.   She denies any hemoptysis, orthopnea, PND, recent travel or antibiotic use Has been using Mucinex and saline nasal rinses with some relief.   Review of Systems Constitutional:   No  weight loss, night sweats,  Fevers, chills, fatigue, or  lassitude.  HEENT:   No headaches,  Difficulty swallowing,  Tooth/dental problems, or  Sore throat,                No sneezing, itching, ear ache,  +nasal congestion, post nasal drip,   CV:  No chest pain,  Orthopnea, PND, swelling in lower extremities, anasarca, dizziness, palpitations, syncope.   GI  No heartburn, indigestion, abdominal pain, nausea, vomiting, diarrhea, change in bowel habits, loss of appetite, bloody stools.   Resp:    No chest wall deformity  Skin: no rash or lesions.  GU: no dysuria, change in color of urine, no urgency or frequency.  No flank pain, no hematuria   MS:  No joint pain or swelling.  No decreased range of motion.  No back pain.  Psych:  No change in mood or affect. No depression or anxiety.  No memory loss.         Objective:   Physical Exam GEN: A/Ox3; pleasant , NAD   HEENT:  [place]/AT,  EACs-clear, TMs-wnl, NOSE-clear drainage THROAT-clear, no lesions, no postnasal drip or exudate noted.   NECK:  Supple w/ fair ROM; no JVD; normal carotid impulses w/o bruits; no thyromegaly or nodules palpated; no lymphadenopathy.  RESP  Faint exp wheeze , no accessory muscle use, no dullness to percussion  CARD:  RRR, no m/r/g  , no peripheral edema, pulses intact, no cyanosis or clubbing.  GI:   Soft & nt; nml bowel sounds; no organomegaly or  masses detected.  Musco: Warm bil, no deformities or joint swelling noted.   Neuro: alert, no focal deficits noted.    Skin: Warm, no lesions or rashes         Assessment & Plan:

## 2013-08-06 NOTE — Addendum Note (Signed)
Addended by: Parke Poisson E on: 08/06/2013 12:45 PM   Modules accepted: Orders

## 2013-08-12 ENCOUNTER — Ambulatory Visit: Payer: BC Managed Care – PPO | Admitting: Critical Care Medicine

## 2013-08-13 ENCOUNTER — Telehealth: Payer: Self-pay | Admitting: Critical Care Medicine

## 2013-08-13 MED ORDER — PREDNISONE 10 MG PO TABS: ORAL_TABLET | ORAL | Status: DC

## 2013-08-13 MED ORDER — LEVOFLOXACIN 500 MG PO TABS: 500.0000 mg | ORAL_TABLET | Freq: Every day | ORAL | Status: DC

## 2013-08-13 NOTE — Telephone Encounter (Signed)
Pt aware of recs. \appt scheduled for 09/06/13. Nothing further needed

## 2013-08-13 NOTE — Telephone Encounter (Signed)
Call in levaquin 500mg  /d x 7days Call in prednisone 10mg  Take 4 for three days 3 for three days 2 for three days 1 for three days and stop #30 I need to see her early sept

## 2013-08-13 NOTE — Telephone Encounter (Signed)
Per 08/06/13 w/ TP: Patient Instructions      Zpack take as directed.   Mucinex DM Twice daily  As needed  Cough/congestion   Saline nasal rinses As needed    Prednisone taper over next week.   May use Advair 500 until sample is gone then resume Advair 250   Please contact office for sooner follow up if symptoms do not improve or worsen or seek emergency care  Follow up Dr. Joya Gaskins 4 weeks and As needed     ---  Called spoke with pt. She reports she finished the ZPAK and prednisone. Pt reports she is now coughing up green phlem, chest congestion, nasal congestion, slight wheezing/chest tx. Pt reports she is taking mucinex daily. She is wanting another round of ABX and prednisone (requesting larger dose this time). Pt also requesting stronger ABX. Please advise Dr. Joya Gaskins thanks  Allergies  Allergen Reactions  . Latex

## 2013-08-13 NOTE — Telephone Encounter (Signed)
Patient calling back now reporting green phlegm, so also requesting abx.

## 2013-08-22 ENCOUNTER — Telehealth: Payer: Self-pay | Admitting: Critical Care Medicine

## 2013-08-22 MED ORDER — AMOXICILLIN-POT CLAVULANATE 875-125 MG PO TABS: 1.0000 | ORAL_TABLET | Freq: Two times a day (BID) | ORAL | Status: DC

## 2013-08-22 NOTE — Telephone Encounter (Signed)
Offer augmentin 875 mg, # 14, 1 twice daily          Suggest otc decongestant like Sudafed-PE once daily- especially before plane flight                         May want to take an otc probiotic like Florastor or Align once daily while on antibiotic

## 2013-08-22 NOTE — Telephone Encounter (Signed)
Spoke with pt and advised of Dr Janee Morn recommendations.  Rx sent to pharmacy

## 2013-08-22 NOTE — Telephone Encounter (Signed)
Pt states she finished Levaquin 2 days ago (had taken Zpak prior to this) and still has 2 days of Pred taper left.  C/o sinus drainage (green again) - Chest congestion - Cough is some better - Sweats -  Concerned about green mucus because she is due to fly to [place] today at 2:00.  Also pt has been using rx of Levalbuterol for [facility] that was prescribed by Dr Donneta Romberg but is asking if we can refill this before she goes out of town.  Please advise.  Allergies  Allergen Reactions  . Latex    Current Outpatient Prescriptions on File Prior to Visit  Medication Sig Dispense Refill  . cholecalciferol (VITAMIN D) 1000 UNITS tablet Take 2,000 Units by mouth every morning.       . Cyanocobalamin (VITAMIN B-12 CR) 1500 MCG TBCR Take 1 tablet by mouth daily. 1000mg  daily      . fluticasone (FLONASE) 50 MCG/ACT nasal spray Place 1 spray into both nostrils daily.  16 g  2  . Fluticasone-Salmeterol (ADVAIR DISKUS) 250-50 MCG/DOSE AEPB USE 1 PUFF TWICE DAILY.  60 each  11  . loratadine (CLARITIN) 10 MG tablet Take 10 mg by mouth daily.      Marland Kitchen losartan-hydrochlorothiazide (HYZAAR) 100-25 MG per tablet Take 1 tablet by mouth daily.      . montelukast (SINGULAIR) 10 MG tablet TAKE 1 TABLET IN THE P.M.  30 tablet  11  . Phenylephrine-DM-GG (MUCINEX CONGEST & COUGH CHILD) 2.5-5-100 MG/5ML LIQD Take 20 mLs by mouth as needed.      . predniSONE (DELTASONE) 10 MG tablet 4 tabs for 2 days, then 3 tabs for 2 days, 2 tabs for 2 days, then 1 tab for 2 days, then stop  20 tablet  0  . predniSONE (DELTASONE) 10 MG tablet Take 4 tabs daily x 3 days, 3 tabs daily x 3 days, 2 tabs daily x 3 days, 1 tab daily x 3 days then stop  30 tablet  0  . RABEprazole (ACIPHEX) 20 MG tablet TAKE 1 TABLET TWICE DAILY, 30 MINUTES BEFORE BREAKFAST AND AT BEDTIME.  60 tablet  5   No current facility-administered medications on file prior to visit.

## 2013-09-06 ENCOUNTER — Ambulatory Visit (INDEPENDENT_AMBULATORY_CARE_PROVIDER_SITE_OTHER): Payer: BC Managed Care – PPO | Admitting: Critical Care Medicine

## 2013-09-06 VITALS — BP 150/90 | HR 93 | Temp 97.6°F | Ht 62.0 in | Wt 148.8 lb

## 2013-09-06 DIAGNOSIS — J329 Chronic sinusitis, unspecified: Secondary | ICD-10-CM

## 2013-09-06 DIAGNOSIS — J45909 Unspecified asthma, uncomplicated: Secondary | ICD-10-CM

## 2013-09-06 DIAGNOSIS — J4540 Moderate persistent asthma, uncomplicated: Secondary | ICD-10-CM

## 2013-09-06 MED ORDER — LEVALBUTEROL HCL 0.63 MG/3ML IN NEBU: 0.6300 mg | INHALATION_SOLUTION | RESPIRATORY_TRACT | Status: DC | PRN

## 2013-09-06 MED ORDER — FLUTICASONE-SALMETEROL 250-50 MCG/DOSE IN AEPB: INHALATION_SPRAY | RESPIRATORY_TRACT | Status: DC

## 2013-09-06 MED ORDER — MONTELUKAST SODIUM 10 MG PO TABS: 10.0000 mg | ORAL_TABLET | Freq: Every day | ORAL | Status: DC

## 2013-09-06 MED ORDER — FLUTICASONE PROPIONATE 50 MCG/ACT NA SUSP: 2.0000 | Freq: Two times a day (BID) | NASAL | Status: DC

## 2013-09-06 MED ORDER — LEVALBUTEROL TARTRATE 45 MCG/ACT IN AERO: 2.0000 | INHALATION_SPRAY | Freq: Four times a day (QID) | RESPIRATORY_TRACT | Status: DC | PRN

## 2013-09-06 NOTE — Assessment & Plan Note (Signed)
Moderate persistent asthma with probable chronic sinusitis Plan Continue Advair Singulair as needed Xopenex No further antibiotics or steroids indicated Obtain CT scan of sinus

## 2013-09-06 NOTE — Progress Notes (Signed)
Subjective:    Patient ID: [name], female    DOB: 1950/11/28, 63 y.o.   MRN: [id number]  HPI 09/06/2013 Chief Complaint  Patient presents with  . Follow-up    lots of drianage    Ill since 08/2013: rx pred x 2 and zpak/augmentin.  Now head is clear, mucus clearer. Pt with pndrip.  Pt noted first of April 2015 :bad sinus infection. Not sleep for two days. Pt seen urgent care then to ED Rx 4hrs head hurt bad. No recent imaging studies.  No sinus pressure.   Noted some fever in 08/2013.  Currently notes no wheezing.  No qhs dyspnea   Review of Systems  Constitutional: Negative for fever and weight loss.  HENT: Positive for congestion. Negative for sore throat.   Respiratory: Positive for cough and sputum production. Negative for hemoptysis, shortness of breath and wheezing.   Cardiovascular: Negative for chest pain, leg swelling and PND.  Gastrointestinal: Negative.   Genitourinary: Negative.   Musculoskeletal: Negative.   Neurological: Negative.    Review of Systems  Constitutional: Negative for fever and weight loss.  HENT: Positive for congestion. Negative for sore throat.   Respiratory: Positive for cough and sputum production. Negative for hemoptysis, shortness of breath and wheezing.   Cardiovascular: Negative for chest pain, leg swelling and PND.  Gastrointestinal: Negative.   Genitourinary: Negative.   Musculoskeletal: Negative.   Neurological: Negative.   Endo/Heme/Allergies: Negative.        Objective:   Physical Exam  Filed Vitals:   09/06/13 1005  BP: 150/90  Pulse: 93  Temp: 97.6 F (36.4 C)  Height: 5\' 2"  (1.575 m)  Weight: 148 lb 12.8 oz (67.495 kg)  SpO2: 95%    Gen: Pleasant, well-nourished, in no distress,  normal affect  ENT: No lesions,  mouth clear,  oropharynx clear, no postnasal drip  Neck: No JVD, no TMG, no carotid bruits  Lungs: No use of accessory muscles, no dullness to percussion, clear without rales or  rhonchi  Cardiovascular: RRR, heart sounds normal, no murmur or gallops, no peripheral edema  Abdomen: soft and NT, no HSM,  BS normal  Musculoskeletal: No deformities, no cyanosis or clubbing  Neuro: alert, non focal  Skin: Warm, no lesions or rashes  No results found.        Assessment & Plan:   Moderate persistent asthma without complication Moderate persistent asthma with probable chronic sinusitis Plan Continue Advair Singulair as needed Xopenex No further antibiotics or steroids indicated Obtain CT scan of sinus   Updated Medication List Outpatient Encounter Prescriptions as of 09/06/2013  Medication Sig  . cholecalciferol (VITAMIN D) 1000 UNITS tablet Take 2,000 Units by mouth every morning.   . Cyanocobalamin (VITAMIN B-12 CR) 1500 MCG TBCR Take 1 tablet by mouth daily. 1000mg  daily  . fluticasone (FLONASE) 50 MCG/ACT nasal spray Place 2 sprays into both nostrils 2 (two) times daily.  . Fluticasone-Salmeterol (ADVAIR DISKUS) 250-50 MCG/DOSE AEPB USE 1 PUFF TWICE DAILY.  Marland Kitchen loratadine (CLARITIN) 10 MG tablet Take 10 mg by mouth daily.  Marland Kitchen losartan-hydrochlorothiazide (HYZAAR) 100-25 MG per tablet Take 1 tablet by mouth daily.  . montelukast (SINGULAIR) 10 MG tablet Take 1 tablet (10 mg total) by mouth at bedtime.  Marland Kitchen Phenylephrine-DM-GG (MUCINEX CONGEST & COUGH CHILD) 2.5-5-100 MG/5ML LIQD Take 20 mLs by mouth as needed.  . RABEprazole (ACIPHEX) 20 MG tablet TAKE 1 TABLET TWICE DAILY, 30 MINUTES BEFORE BREAKFAST AND AT BEDTIME.  . [DISCONTINUED] fluticasone (FLONASE)  50 MCG/ACT nasal spray Place 1 spray into both nostrils daily.  . [DISCONTINUED] Fluticasone-Salmeterol (ADVAIR DISKUS) 250-50 MCG/DOSE AEPB USE 1 PUFF TWICE DAILY.  . [DISCONTINUED] montelukast (SINGULAIR) 10 MG tablet TAKE 1 TABLET IN THE P.M.  . alendronate (FOSAMAX) 70 MG tablet Take 70 mg by mouth once a week.  . levalbuterol (XOPENEX HFA) 45 MCG/ACT inhaler Inhale 2 puffs into the lungs every 6 (six)  hours as needed for wheezing.  . levalbuterol (XOPENEX) 0.63 MG/3ML nebulizer solution Take 3 mLs (0.63 mg total) by nebulization every 4 (four) hours as needed for wheezing or shortness of breath.  . [DISCONTINUED] amoxicillin-clavulanate (AUGMENTIN) 875-125 MG per tablet Take 1 tablet by mouth 2 (two) times daily.  . [DISCONTINUED] predniSONE (DELTASONE) 10 MG tablet 4 tabs for 2 days, then 3 tabs for 2 days, 2 tabs for 2 days, then 1 tab for 2 days, then stop  . [DISCONTINUED] predniSONE (DELTASONE) 10 MG tablet Take 4 tabs daily x 3 days, 3 tabs daily x 3 days, 2 tabs daily x 3 days, 1 tab daily x 3 days then stop

## 2013-09-06 NOTE — Patient Instructions (Signed)
A CT Scan of sinus will be obtained Flonase increase to two puff twice daily ea nare Stay on claritin, advair, singulair , sinus rinse for now Return 3 months, we will call CT results

## 2013-09-10 ENCOUNTER — Telehealth: Payer: Self-pay | Admitting: Critical Care Medicine

## 2013-09-10 ENCOUNTER — Ambulatory Visit (INDEPENDENT_AMBULATORY_CARE_PROVIDER_SITE_OTHER)
Admission: RE | Admit: 2013-09-10 | Discharge: 2013-09-10 | Disposition: A | Discharge status: Home / Self Care | Payer: BC Managed Care – PPO | Source: Ambulatory Visit | Attending: Critical Care Medicine | Admitting: Critical Care Medicine

## 2013-09-10 DIAGNOSIS — J329 Chronic sinusitis, unspecified: Secondary | ICD-10-CM

## 2013-09-10 NOTE — Progress Notes (Signed)
Quick Note:  Notify the patient that the Ct sinus is NORMAL, no sinusitis seen. No change in medications are recommended. Continue current meds as prescribed at last office visit ______

## 2013-09-10 NOTE — Progress Notes (Signed)
Quick Note:  lmomtcb for pt ______ 

## 2013-09-11 NOTE — Progress Notes (Signed)
Quick Note:  Spoke with pt. Informed her of CT Sinus results and recs per Dr. Joya Gaskins. She verbalized understanding and voiced no further questions or concerns at this time. ______

## 2013-09-11 NOTE — Telephone Encounter (Signed)
Notes Recorded by Elsie Stain, MD on 09/10/2013 at 1:50 PM Notify the patient that the Ct sinus is NORMAL, no sinusitis seen. No change in medications are recommended. Continue current meds as prescribed at last office visit  --------  Called, spoke with pt.  Informed her of CT Sinus results and recs per Dr. Joya Gaskins.  She verbalized understanding and voiced no further questions or concerns at this time.

## 2013-10-25 ENCOUNTER — Telehealth: Payer: Self-pay | Admitting: Critical Care Medicine

## 2013-10-25 MED ORDER — OMEPRAZOLE 20 MG PO CPDR: 20.0000 mg | DELAYED_RELEASE_CAPSULE | Freq: Every day | ORAL | Status: DC

## 2013-10-25 NOTE — Telephone Encounter (Signed)
i would Rx and take omeprazole 20mg  daily

## 2013-10-25 NOTE — Telephone Encounter (Signed)
Spoke with the pt and notified of recs per PW  She verbalized understanding  Rx was sent to [facility] Nothing further needed

## 2013-10-25 NOTE — Telephone Encounter (Signed)
Spoke with the pt  She states that since the last ov she is having increased throat clearing and wakes up with mucus in her throat  She feels that this is increased GERD symptoms from taking fosamax  This was discussed at last ov  She stopped the aciphex and started on OTC priolsec at hs and this has helped  She wants PW to prescribe something similar to the prilosec since she read that you should not take for more than 14 days  She states ok with waiting until Monday 10/26  She does not want to take Nexium b/c it has caused hair loss in the past  Please advise thanks!

## 2013-11-04 ENCOUNTER — Encounter: Payer: Self-pay | Admitting: Adult Health

## 2013-12-30 ENCOUNTER — Other Ambulatory Visit: Payer: Self-pay | Admitting: Dermatology

## 2014-05-01 ENCOUNTER — Other Ambulatory Visit: Payer: Self-pay | Admitting: Critical Care Medicine

## 2014-05-02 ENCOUNTER — Encounter: Payer: Self-pay | Admitting: Gastroenterology

## 2014-05-09 DIAGNOSIS — J301 Allergic rhinitis due to pollen: Secondary | ICD-10-CM | POA: Diagnosis not present

## 2014-05-09 DIAGNOSIS — J3089 Other allergic rhinitis: Secondary | ICD-10-CM | POA: Diagnosis not present

## 2014-05-27 DIAGNOSIS — J301 Allergic rhinitis due to pollen: Secondary | ICD-10-CM | POA: Diagnosis not present

## 2014-05-27 DIAGNOSIS — J3089 Other allergic rhinitis: Secondary | ICD-10-CM | POA: Diagnosis not present

## 2014-06-06 DIAGNOSIS — J301 Allergic rhinitis due to pollen: Secondary | ICD-10-CM | POA: Diagnosis not present

## 2014-06-06 DIAGNOSIS — J3089 Other allergic rhinitis: Secondary | ICD-10-CM | POA: Diagnosis not present

## 2014-06-10 DIAGNOSIS — J301 Allergic rhinitis due to pollen: Secondary | ICD-10-CM | POA: Diagnosis not present

## 2014-06-10 DIAGNOSIS — J3089 Other allergic rhinitis: Secondary | ICD-10-CM | POA: Diagnosis not present

## 2014-06-25 DIAGNOSIS — J301 Allergic rhinitis due to pollen: Secondary | ICD-10-CM | POA: Diagnosis not present

## 2014-06-25 DIAGNOSIS — J3089 Other allergic rhinitis: Secondary | ICD-10-CM | POA: Diagnosis not present

## 2014-07-02 DIAGNOSIS — J301 Allergic rhinitis due to pollen: Secondary | ICD-10-CM | POA: Diagnosis not present

## 2014-07-14 DIAGNOSIS — J3089 Other allergic rhinitis: Secondary | ICD-10-CM | POA: Diagnosis not present

## 2014-07-14 DIAGNOSIS — J301 Allergic rhinitis due to pollen: Secondary | ICD-10-CM | POA: Diagnosis not present

## 2014-07-21 DIAGNOSIS — J3089 Other allergic rhinitis: Secondary | ICD-10-CM | POA: Diagnosis not present

## 2014-07-21 DIAGNOSIS — J301 Allergic rhinitis due to pollen: Secondary | ICD-10-CM | POA: Diagnosis not present

## 2014-07-30 DIAGNOSIS — J301 Allergic rhinitis due to pollen: Secondary | ICD-10-CM | POA: Diagnosis not present

## 2014-07-30 DIAGNOSIS — J3089 Other allergic rhinitis: Secondary | ICD-10-CM | POA: Diagnosis not present

## 2014-08-05 DIAGNOSIS — J301 Allergic rhinitis due to pollen: Secondary | ICD-10-CM | POA: Diagnosis not present

## 2014-08-05 DIAGNOSIS — J3089 Other allergic rhinitis: Secondary | ICD-10-CM | POA: Diagnosis not present

## 2014-08-08 DIAGNOSIS — J301 Allergic rhinitis due to pollen: Secondary | ICD-10-CM | POA: Diagnosis not present

## 2014-08-14 DIAGNOSIS — J301 Allergic rhinitis due to pollen: Secondary | ICD-10-CM | POA: Diagnosis not present

## 2014-08-14 DIAGNOSIS — J3089 Other allergic rhinitis: Secondary | ICD-10-CM | POA: Diagnosis not present

## 2014-08-15 ENCOUNTER — Other Ambulatory Visit: Payer: Self-pay

## 2014-08-15 DIAGNOSIS — Z1231 Encounter for screening mammogram for malignant neoplasm of breast: Secondary | ICD-10-CM

## 2014-08-19 DIAGNOSIS — J301 Allergic rhinitis due to pollen: Secondary | ICD-10-CM | POA: Diagnosis not present

## 2014-08-19 DIAGNOSIS — J3089 Other allergic rhinitis: Secondary | ICD-10-CM | POA: Diagnosis not present

## 2014-08-29 DIAGNOSIS — J301 Allergic rhinitis due to pollen: Secondary | ICD-10-CM | POA: Diagnosis not present

## 2014-08-29 DIAGNOSIS — J3089 Other allergic rhinitis: Secondary | ICD-10-CM | POA: Diagnosis not present

## 2014-09-04 DIAGNOSIS — J3089 Other allergic rhinitis: Secondary | ICD-10-CM | POA: Diagnosis not present

## 2014-09-04 DIAGNOSIS — J301 Allergic rhinitis due to pollen: Secondary | ICD-10-CM | POA: Diagnosis not present

## 2014-09-11 ENCOUNTER — Telehealth: Payer: Self-pay | Admitting: Critical Care Medicine

## 2014-09-11 DIAGNOSIS — J301 Allergic rhinitis due to pollen: Secondary | ICD-10-CM | POA: Diagnosis not present

## 2014-09-11 DIAGNOSIS — J3089 Other allergic rhinitis: Secondary | ICD-10-CM | POA: Diagnosis not present

## 2014-09-11 MED ORDER — OMEPRAZOLE 20 MG PO CPDR: DELAYED_RELEASE_CAPSULE | ORAL | Status: DC

## 2014-09-11 MED ORDER — MONTELUKAST SODIUM 10 MG PO TABS: 10.0000 mg | ORAL_TABLET | Freq: Every day | ORAL | Status: DC

## 2014-09-11 NOTE — Telephone Encounter (Signed)
Patient scheduled to see Dr. Elsworth Soho 10/29/14.  Patient requesting refills on her medication until next OV Rx sent to pharmacy. Patient notified. Nothing further needed.

## 2014-09-19 DIAGNOSIS — J301 Allergic rhinitis due to pollen: Secondary | ICD-10-CM | POA: Diagnosis not present

## 2014-09-19 DIAGNOSIS — J3089 Other allergic rhinitis: Secondary | ICD-10-CM | POA: Diagnosis not present

## 2014-09-22 DIAGNOSIS — J301 Allergic rhinitis due to pollen: Secondary | ICD-10-CM | POA: Diagnosis not present

## 2014-09-22 DIAGNOSIS — J3089 Other allergic rhinitis: Secondary | ICD-10-CM | POA: Diagnosis not present

## 2014-09-24 ENCOUNTER — Ambulatory Visit
Admission: RE | Admit: 2014-09-24 | Discharge: 2014-09-24 | Disposition: A | Discharge status: Home / Self Care | Payer: Medicare Other | Source: Ambulatory Visit

## 2014-09-24 DIAGNOSIS — Z1231 Encounter for screening mammogram for malignant neoplasm of breast: Secondary | ICD-10-CM | POA: Diagnosis not present

## 2014-09-26 DIAGNOSIS — J3089 Other allergic rhinitis: Secondary | ICD-10-CM | POA: Diagnosis not present

## 2014-09-29 ENCOUNTER — Other Ambulatory Visit: Payer: Self-pay | Admitting: Critical Care Medicine

## 2014-09-29 NOTE — Telephone Encounter (Signed)
Pt last seen by PW 09/2013.  Has pending appt on 10/29/14 with RA. Rx sent to pharm with instructions to please keep pending appt for refills.

## 2014-10-06 DIAGNOSIS — J301 Allergic rhinitis due to pollen: Secondary | ICD-10-CM | POA: Diagnosis not present

## 2014-10-06 DIAGNOSIS — J3089 Other allergic rhinitis: Secondary | ICD-10-CM | POA: Diagnosis not present

## 2014-10-08 DIAGNOSIS — J4530 Mild persistent asthma, uncomplicated: Secondary | ICD-10-CM | POA: Diagnosis not present

## 2014-10-08 DIAGNOSIS — K219 Gastro-esophageal reflux disease without esophagitis: Secondary | ICD-10-CM | POA: Diagnosis not present

## 2014-10-08 DIAGNOSIS — J3089 Other allergic rhinitis: Secondary | ICD-10-CM | POA: Diagnosis not present

## 2014-10-08 DIAGNOSIS — J301 Allergic rhinitis due to pollen: Secondary | ICD-10-CM | POA: Diagnosis not present

## 2014-10-13 DIAGNOSIS — J3089 Other allergic rhinitis: Secondary | ICD-10-CM | POA: Diagnosis not present

## 2014-10-13 DIAGNOSIS — J301 Allergic rhinitis due to pollen: Secondary | ICD-10-CM | POA: Diagnosis not present

## 2014-10-15 DIAGNOSIS — J3089 Other allergic rhinitis: Secondary | ICD-10-CM | POA: Diagnosis not present

## 2014-10-23 DIAGNOSIS — J301 Allergic rhinitis due to pollen: Secondary | ICD-10-CM | POA: Diagnosis not present

## 2014-10-23 DIAGNOSIS — J3089 Other allergic rhinitis: Secondary | ICD-10-CM | POA: Diagnosis not present

## 2014-10-29 ENCOUNTER — Ambulatory Visit (INDEPENDENT_AMBULATORY_CARE_PROVIDER_SITE_OTHER): Payer: Medicare Other | Admitting: Pulmonary Disease

## 2014-10-29 ENCOUNTER — Encounter: Payer: Self-pay | Admitting: Pulmonary Disease

## 2014-10-29 VITALS — BP 102/68 | HR 88 | Ht 64.0 in | Wt 145.6 lb

## 2014-10-29 DIAGNOSIS — J301 Allergic rhinitis due to pollen: Secondary | ICD-10-CM

## 2014-10-29 DIAGNOSIS — J4540 Moderate persistent asthma, uncomplicated: Secondary | ICD-10-CM | POA: Diagnosis not present

## 2014-10-29 DIAGNOSIS — Z23 Encounter for immunization: Secondary | ICD-10-CM

## 2014-10-29 MED ORDER — MONTELUKAST SODIUM 10 MG PO TABS: 10.0000 mg | ORAL_TABLET | Freq: Every day | ORAL | Status: DC

## 2014-10-29 MED ORDER — PREDNISONE 10 MG PO TABS: 10.0000 mg | ORAL_TABLET | ORAL | Status: DC

## 2014-10-29 NOTE — Assessment & Plan Note (Signed)
  Trigger seems to be PND - no active infection  Trial of decongestant - Sudafed - (OTC) x 7 days If worse, Prednisone 5 mg tabs  # 30 - Take 2 tabs daily with food x 5ds, then 1 tab daily with food x 5ds then STOP Call for antibiotic if phlegm changes color  Refills on singulair Flu shot

## 2014-10-29 NOTE — Assessment & Plan Note (Signed)
Trial of sudafed Ct flonase

## 2014-10-29 NOTE — Patient Instructions (Addendum)
Trial of decongestant - Sudafed - (OTC) x 7 days If worse, Prednisone 5 mg tabs  # 30 - Take 2 tabs daily with food x 5ds, then 1 tab daily with food x 5ds then STOP Call for antibiotic if phlegm changes color  Refills on singulair Flu shot

## 2014-10-29 NOTE — Progress Notes (Signed)
   Subjective:    Patient ID: [name], female    DOB: 02-04-1950, 64 y.o.   MRN: [id number]  HPI  Never smoker with Moderate persistent asthma with PND & GERD  Chief Complaint  Patient presents with  . Follow-up    former PW pt; breathing doing great.  starting on Monday, the weather change has caused her allergies to flare, drainage down back of throat, yellow drainage, redness, had white patch on tonsil yesterday;  flu shot   - On allergy shots ( sharma) Had a good year Flares during spring & fall She was outside with her grandkids on a windy day and since then complains of postnasal drip, she is worried she may have strep throat, no increased wheezing or dyspnea   Significant tests/ events Spirometry 2010 normal CT sinuses 09/2013 normal   Review of Systems neg for any significant sore throat, dysphagia, itching, sneezing, nasal congestion or excess/ purulent secretions, fever, chills, sweats, unintended wt loss, pleuritic or exertional cp, hempoptysis, orthopnea pnd or change in chronic leg swelling. Also denies presyncope, palpitations, heartburn, abdominal pain, nausea, vomiting, diarrhea or change in bowel or urinary habits, dysuria,hematuria, rash, arthralgias, visual complaints, headache, numbness weakness or ataxia.      Objective:   Physical Exam  Gen. Pleasant, well-nourished, in no distress ENT - no lesions, no post nasal drip Neck: No JVD, no thyromegaly, no carotid bruits Lungs: no use of accessory muscles, no dullness to percussion, clear without rales or rhonchi  Cardiovascular: Rhythm regular, heart sounds  normal, no murmurs or gallops, no peripheral edema Musculoskeletal: No deformities, no cyanosis or clubbing        Assessment & Plan:

## 2014-10-30 ENCOUNTER — Telehealth: Payer: Self-pay | Admitting: Pulmonary Disease

## 2014-10-30 MED ORDER — PREDNISONE 10 MG PO TABS: ORAL_TABLET | ORAL | Status: DC

## 2014-10-30 NOTE — Telephone Encounter (Signed)
Rx was not sent to the pharm yesterday. I have done so. Pt aware. Nothing further needed

## 2014-10-31 DIAGNOSIS — J301 Allergic rhinitis due to pollen: Secondary | ICD-10-CM | POA: Diagnosis not present

## 2014-10-31 DIAGNOSIS — J3089 Other allergic rhinitis: Secondary | ICD-10-CM | POA: Diagnosis not present

## 2014-11-06 DIAGNOSIS — J301 Allergic rhinitis due to pollen: Secondary | ICD-10-CM | POA: Diagnosis not present

## 2014-11-06 DIAGNOSIS — J3089 Other allergic rhinitis: Secondary | ICD-10-CM | POA: Diagnosis not present

## 2014-11-14 DIAGNOSIS — J3089 Other allergic rhinitis: Secondary | ICD-10-CM | POA: Diagnosis not present

## 2014-11-14 DIAGNOSIS — J301 Allergic rhinitis due to pollen: Secondary | ICD-10-CM | POA: Diagnosis not present

## 2014-11-21 DIAGNOSIS — J301 Allergic rhinitis due to pollen: Secondary | ICD-10-CM | POA: Diagnosis not present

## 2014-11-21 DIAGNOSIS — J3089 Other allergic rhinitis: Secondary | ICD-10-CM | POA: Diagnosis not present

## 2014-11-25 DIAGNOSIS — J3089 Other allergic rhinitis: Secondary | ICD-10-CM | POA: Diagnosis not present

## 2014-11-25 DIAGNOSIS — J301 Allergic rhinitis due to pollen: Secondary | ICD-10-CM | POA: Diagnosis not present

## 2014-12-04 DIAGNOSIS — J3089 Other allergic rhinitis: Secondary | ICD-10-CM | POA: Diagnosis not present

## 2014-12-04 DIAGNOSIS — J301 Allergic rhinitis due to pollen: Secondary | ICD-10-CM | POA: Diagnosis not present

## 2014-12-12 DIAGNOSIS — J3089 Other allergic rhinitis: Secondary | ICD-10-CM | POA: Diagnosis not present

## 2014-12-12 DIAGNOSIS — J301 Allergic rhinitis due to pollen: Secondary | ICD-10-CM | POA: Diagnosis not present

## 2014-12-22 DIAGNOSIS — T56891A Toxic effect of other metals, accidental (unintentional), initial encounter: Secondary | ICD-10-CM | POA: Diagnosis not present

## 2014-12-22 DIAGNOSIS — Z0000 Encounter for general adult medical examination without abnormal findings: Secondary | ICD-10-CM | POA: Diagnosis not present

## 2014-12-22 DIAGNOSIS — Z77018 Contact with and (suspected) exposure to other hazardous metals: Secondary | ICD-10-CM | POA: Diagnosis not present

## 2014-12-22 DIAGNOSIS — E785 Hyperlipidemia, unspecified: Secondary | ICD-10-CM | POA: Diagnosis not present

## 2014-12-22 DIAGNOSIS — Z23 Encounter for immunization: Secondary | ICD-10-CM | POA: Diagnosis not present

## 2014-12-22 DIAGNOSIS — E538 Deficiency of other specified B group vitamins: Secondary | ICD-10-CM | POA: Diagnosis not present

## 2014-12-22 DIAGNOSIS — E559 Vitamin D deficiency, unspecified: Secondary | ICD-10-CM | POA: Diagnosis not present

## 2014-12-22 DIAGNOSIS — E611 Iron deficiency: Secondary | ICD-10-CM | POA: Diagnosis not present

## 2014-12-22 DIAGNOSIS — J40 Bronchitis, not specified as acute or chronic: Secondary | ICD-10-CM | POA: Diagnosis not present

## 2014-12-23 ENCOUNTER — Other Ambulatory Visit: Payer: Self-pay | Admitting: Pulmonary Disease

## 2014-12-25 DIAGNOSIS — J3089 Other allergic rhinitis: Secondary | ICD-10-CM | POA: Diagnosis not present

## 2014-12-25 DIAGNOSIS — J301 Allergic rhinitis due to pollen: Secondary | ICD-10-CM | POA: Diagnosis not present

## 2015-01-07 DIAGNOSIS — Z85828 Personal history of other malignant neoplasm of skin: Secondary | ICD-10-CM | POA: Diagnosis not present

## 2015-01-07 DIAGNOSIS — D2272 Melanocytic nevi of left lower limb, including hip: Secondary | ICD-10-CM | POA: Diagnosis not present

## 2015-01-07 DIAGNOSIS — L821 Other seborrheic keratosis: Secondary | ICD-10-CM | POA: Diagnosis not present

## 2015-01-07 DIAGNOSIS — D2271 Melanocytic nevi of right lower limb, including hip: Secondary | ICD-10-CM | POA: Diagnosis not present

## 2015-01-07 DIAGNOSIS — D485 Neoplasm of uncertain behavior of skin: Secondary | ICD-10-CM | POA: Diagnosis not present

## 2015-01-07 DIAGNOSIS — B356 Tinea cruris: Secondary | ICD-10-CM | POA: Diagnosis not present

## 2015-01-07 DIAGNOSIS — L218 Other seborrheic dermatitis: Secondary | ICD-10-CM | POA: Diagnosis not present

## 2015-01-07 DIAGNOSIS — D225 Melanocytic nevi of trunk: Secondary | ICD-10-CM | POA: Diagnosis not present

## 2015-01-15 DIAGNOSIS — J301 Allergic rhinitis due to pollen: Secondary | ICD-10-CM | POA: Diagnosis not present

## 2015-01-15 DIAGNOSIS — J3089 Other allergic rhinitis: Secondary | ICD-10-CM | POA: Diagnosis not present

## 2015-01-22 DIAGNOSIS — J301 Allergic rhinitis due to pollen: Secondary | ICD-10-CM | POA: Diagnosis not present

## 2015-01-22 DIAGNOSIS — J3089 Other allergic rhinitis: Secondary | ICD-10-CM | POA: Diagnosis not present

## 2015-01-27 ENCOUNTER — Other Ambulatory Visit: Payer: Self-pay | Admitting: Pulmonary Disease

## 2015-01-28 DIAGNOSIS — M810 Age-related osteoporosis without current pathological fracture: Secondary | ICD-10-CM | POA: Diagnosis not present

## 2015-02-06 DIAGNOSIS — J301 Allergic rhinitis due to pollen: Secondary | ICD-10-CM | POA: Diagnosis not present

## 2015-02-06 DIAGNOSIS — J3089 Other allergic rhinitis: Secondary | ICD-10-CM | POA: Diagnosis not present

## 2015-02-13 DIAGNOSIS — J3089 Other allergic rhinitis: Secondary | ICD-10-CM | POA: Diagnosis not present

## 2015-02-13 DIAGNOSIS — J301 Allergic rhinitis due to pollen: Secondary | ICD-10-CM | POA: Diagnosis not present

## 2015-02-26 DIAGNOSIS — J3089 Other allergic rhinitis: Secondary | ICD-10-CM | POA: Diagnosis not present

## 2015-02-26 DIAGNOSIS — J301 Allergic rhinitis due to pollen: Secondary | ICD-10-CM | POA: Diagnosis not present

## 2015-03-10 DIAGNOSIS — J3089 Other allergic rhinitis: Secondary | ICD-10-CM | POA: Diagnosis not present

## 2015-03-10 DIAGNOSIS — J301 Allergic rhinitis due to pollen: Secondary | ICD-10-CM | POA: Diagnosis not present

## 2015-03-13 DIAGNOSIS — J3089 Other allergic rhinitis: Secondary | ICD-10-CM | POA: Diagnosis not present

## 2015-03-13 DIAGNOSIS — J301 Allergic rhinitis due to pollen: Secondary | ICD-10-CM | POA: Diagnosis not present

## 2015-03-17 ENCOUNTER — Other Ambulatory Visit: Payer: Self-pay | Admitting: Pulmonary Disease

## 2015-03-20 DIAGNOSIS — J3089 Other allergic rhinitis: Secondary | ICD-10-CM | POA: Diagnosis not present

## 2015-03-20 DIAGNOSIS — J301 Allergic rhinitis due to pollen: Secondary | ICD-10-CM | POA: Diagnosis not present

## 2015-03-24 DIAGNOSIS — J3089 Other allergic rhinitis: Secondary | ICD-10-CM | POA: Diagnosis not present

## 2015-03-24 DIAGNOSIS — J301 Allergic rhinitis due to pollen: Secondary | ICD-10-CM | POA: Diagnosis not present

## 2015-03-30 ENCOUNTER — Other Ambulatory Visit: Payer: Self-pay | Admitting: Pulmonary Disease

## 2015-03-30 DIAGNOSIS — J301 Allergic rhinitis due to pollen: Secondary | ICD-10-CM | POA: Diagnosis not present

## 2015-03-30 DIAGNOSIS — J3089 Other allergic rhinitis: Secondary | ICD-10-CM | POA: Diagnosis not present

## 2015-04-06 DIAGNOSIS — J3089 Other allergic rhinitis: Secondary | ICD-10-CM | POA: Diagnosis not present

## 2015-04-10 DIAGNOSIS — J301 Allergic rhinitis due to pollen: Secondary | ICD-10-CM | POA: Diagnosis not present

## 2015-04-10 DIAGNOSIS — J3089 Other allergic rhinitis: Secondary | ICD-10-CM | POA: Diagnosis not present

## 2015-04-13 DIAGNOSIS — J301 Allergic rhinitis due to pollen: Secondary | ICD-10-CM | POA: Diagnosis not present

## 2015-04-13 DIAGNOSIS — J3089 Other allergic rhinitis: Secondary | ICD-10-CM | POA: Diagnosis not present

## 2015-04-15 DIAGNOSIS — J3089 Other allergic rhinitis: Secondary | ICD-10-CM | POA: Diagnosis not present

## 2015-04-15 DIAGNOSIS — J301 Allergic rhinitis due to pollen: Secondary | ICD-10-CM | POA: Diagnosis not present

## 2015-04-22 DIAGNOSIS — J301 Allergic rhinitis due to pollen: Secondary | ICD-10-CM | POA: Diagnosis not present

## 2015-04-22 DIAGNOSIS — J3089 Other allergic rhinitis: Secondary | ICD-10-CM | POA: Diagnosis not present

## 2015-04-26 ENCOUNTER — Other Ambulatory Visit: Payer: Self-pay | Admitting: Pulmonary Disease

## 2015-04-28 DIAGNOSIS — J3089 Other allergic rhinitis: Secondary | ICD-10-CM | POA: Diagnosis not present

## 2015-04-28 DIAGNOSIS — J301 Allergic rhinitis due to pollen: Secondary | ICD-10-CM | POA: Diagnosis not present

## 2015-05-06 DIAGNOSIS — J301 Allergic rhinitis due to pollen: Secondary | ICD-10-CM | POA: Diagnosis not present

## 2015-05-06 DIAGNOSIS — J3081 Allergic rhinitis due to animal (cat) (dog) hair and dander: Secondary | ICD-10-CM | POA: Diagnosis not present

## 2015-05-14 DIAGNOSIS — J3089 Other allergic rhinitis: Secondary | ICD-10-CM | POA: Diagnosis not present

## 2015-05-14 DIAGNOSIS — J301 Allergic rhinitis due to pollen: Secondary | ICD-10-CM | POA: Diagnosis not present

## 2015-05-18 ENCOUNTER — Other Ambulatory Visit: Payer: Self-pay | Admitting: Pulmonary Disease

## 2015-05-21 DIAGNOSIS — J301 Allergic rhinitis due to pollen: Secondary | ICD-10-CM | POA: Diagnosis not present

## 2015-05-21 DIAGNOSIS — J3089 Other allergic rhinitis: Secondary | ICD-10-CM | POA: Diagnosis not present

## 2015-06-05 DIAGNOSIS — J3089 Other allergic rhinitis: Secondary | ICD-10-CM | POA: Diagnosis not present

## 2015-06-05 DIAGNOSIS — J301 Allergic rhinitis due to pollen: Secondary | ICD-10-CM | POA: Diagnosis not present

## 2015-06-15 DIAGNOSIS — J3089 Other allergic rhinitis: Secondary | ICD-10-CM | POA: Diagnosis not present

## 2015-06-15 DIAGNOSIS — J301 Allergic rhinitis due to pollen: Secondary | ICD-10-CM | POA: Diagnosis not present

## 2015-06-22 DIAGNOSIS — J3089 Other allergic rhinitis: Secondary | ICD-10-CM | POA: Diagnosis not present

## 2015-06-22 DIAGNOSIS — J301 Allergic rhinitis due to pollen: Secondary | ICD-10-CM | POA: Diagnosis not present

## 2015-06-29 ENCOUNTER — Other Ambulatory Visit: Payer: Self-pay | Admitting: Pulmonary Disease

## 2015-06-29 DIAGNOSIS — J3089 Other allergic rhinitis: Secondary | ICD-10-CM | POA: Diagnosis not present

## 2015-06-29 DIAGNOSIS — J301 Allergic rhinitis due to pollen: Secondary | ICD-10-CM | POA: Diagnosis not present

## 2015-07-16 DIAGNOSIS — J3089 Other allergic rhinitis: Secondary | ICD-10-CM | POA: Diagnosis not present

## 2015-07-16 DIAGNOSIS — J301 Allergic rhinitis due to pollen: Secondary | ICD-10-CM | POA: Diagnosis not present

## 2015-07-24 DIAGNOSIS — J3089 Other allergic rhinitis: Secondary | ICD-10-CM | POA: Diagnosis not present

## 2015-07-24 DIAGNOSIS — J301 Allergic rhinitis due to pollen: Secondary | ICD-10-CM | POA: Diagnosis not present

## 2015-07-28 ENCOUNTER — Other Ambulatory Visit: Payer: Self-pay | Admitting: Pulmonary Disease

## 2015-08-04 DIAGNOSIS — J301 Allergic rhinitis due to pollen: Secondary | ICD-10-CM | POA: Diagnosis not present

## 2015-08-04 DIAGNOSIS — J3089 Other allergic rhinitis: Secondary | ICD-10-CM | POA: Diagnosis not present

## 2015-08-06 DIAGNOSIS — J301 Allergic rhinitis due to pollen: Secondary | ICD-10-CM | POA: Diagnosis not present

## 2015-08-07 DIAGNOSIS — J3089 Other allergic rhinitis: Secondary | ICD-10-CM | POA: Diagnosis not present

## 2015-08-11 DIAGNOSIS — J3089 Other allergic rhinitis: Secondary | ICD-10-CM | POA: Diagnosis not present

## 2015-08-11 DIAGNOSIS — J301 Allergic rhinitis due to pollen: Secondary | ICD-10-CM | POA: Diagnosis not present

## 2015-08-14 ENCOUNTER — Other Ambulatory Visit: Payer: Self-pay | Admitting: Family Medicine

## 2015-08-14 DIAGNOSIS — Z1231 Encounter for screening mammogram for malignant neoplasm of breast: Secondary | ICD-10-CM

## 2015-08-21 DIAGNOSIS — J3089 Other allergic rhinitis: Secondary | ICD-10-CM | POA: Diagnosis not present

## 2015-08-21 DIAGNOSIS — J301 Allergic rhinitis due to pollen: Secondary | ICD-10-CM | POA: Diagnosis not present

## 2015-08-26 DIAGNOSIS — J301 Allergic rhinitis due to pollen: Secondary | ICD-10-CM | POA: Diagnosis not present

## 2015-08-26 DIAGNOSIS — J3089 Other allergic rhinitis: Secondary | ICD-10-CM | POA: Diagnosis not present

## 2015-08-28 DIAGNOSIS — J301 Allergic rhinitis due to pollen: Secondary | ICD-10-CM | POA: Diagnosis not present

## 2015-08-28 DIAGNOSIS — J3089 Other allergic rhinitis: Secondary | ICD-10-CM | POA: Diagnosis not present

## 2015-08-31 ENCOUNTER — Other Ambulatory Visit: Payer: Self-pay | Admitting: Pulmonary Disease

## 2015-09-02 DIAGNOSIS — J3089 Other allergic rhinitis: Secondary | ICD-10-CM | POA: Diagnosis not present

## 2015-09-02 DIAGNOSIS — J301 Allergic rhinitis due to pollen: Secondary | ICD-10-CM | POA: Diagnosis not present

## 2015-09-03 ENCOUNTER — Other Ambulatory Visit: Payer: Self-pay | Admitting: Pulmonary Disease

## 2015-09-04 DIAGNOSIS — J301 Allergic rhinitis due to pollen: Secondary | ICD-10-CM | POA: Diagnosis not present

## 2015-09-04 DIAGNOSIS — J3089 Other allergic rhinitis: Secondary | ICD-10-CM | POA: Diagnosis not present

## 2015-09-08 DIAGNOSIS — J301 Allergic rhinitis due to pollen: Secondary | ICD-10-CM | POA: Diagnosis not present

## 2015-09-08 DIAGNOSIS — J3089 Other allergic rhinitis: Secondary | ICD-10-CM | POA: Diagnosis not present

## 2015-09-11 DIAGNOSIS — J301 Allergic rhinitis due to pollen: Secondary | ICD-10-CM | POA: Diagnosis not present

## 2015-09-16 DIAGNOSIS — J301 Allergic rhinitis due to pollen: Secondary | ICD-10-CM | POA: Diagnosis not present

## 2015-09-16 DIAGNOSIS — J3089 Other allergic rhinitis: Secondary | ICD-10-CM | POA: Diagnosis not present

## 2015-09-24 DIAGNOSIS — J301 Allergic rhinitis due to pollen: Secondary | ICD-10-CM | POA: Diagnosis not present

## 2015-09-24 DIAGNOSIS — J3089 Other allergic rhinitis: Secondary | ICD-10-CM | POA: Diagnosis not present

## 2015-09-28 DIAGNOSIS — J3089 Other allergic rhinitis: Secondary | ICD-10-CM | POA: Diagnosis not present

## 2015-09-28 DIAGNOSIS — J301 Allergic rhinitis due to pollen: Secondary | ICD-10-CM | POA: Diagnosis not present

## 2015-10-02 DIAGNOSIS — J3089 Other allergic rhinitis: Secondary | ICD-10-CM | POA: Diagnosis not present

## 2015-10-05 ENCOUNTER — Other Ambulatory Visit: Payer: Self-pay | Admitting: Pulmonary Disease

## 2015-10-05 DIAGNOSIS — H40013 Open angle with borderline findings, low risk, bilateral: Secondary | ICD-10-CM | POA: Diagnosis not present

## 2015-10-05 DIAGNOSIS — H25013 Cortical age-related cataract, bilateral: Secondary | ICD-10-CM | POA: Diagnosis not present

## 2015-10-05 DIAGNOSIS — H2513 Age-related nuclear cataract, bilateral: Secondary | ICD-10-CM | POA: Diagnosis not present

## 2015-10-05 DIAGNOSIS — H35033 Hypertensive retinopathy, bilateral: Secondary | ICD-10-CM | POA: Diagnosis not present

## 2015-10-07 DIAGNOSIS — J3089 Other allergic rhinitis: Secondary | ICD-10-CM | POA: Diagnosis not present

## 2015-10-07 DIAGNOSIS — J301 Allergic rhinitis due to pollen: Secondary | ICD-10-CM | POA: Diagnosis not present

## 2015-10-08 ENCOUNTER — Ambulatory Visit
Admission: RE | Admit: 2015-10-08 | Discharge: 2015-10-08 | Disposition: A | Discharge status: Home / Self Care | Payer: Medicare Other | Source: Ambulatory Visit | Attending: Family Medicine | Admitting: Family Medicine

## 2015-10-08 DIAGNOSIS — Z1231 Encounter for screening mammogram for malignant neoplasm of breast: Secondary | ICD-10-CM | POA: Diagnosis not present

## 2015-10-12 DIAGNOSIS — J3089 Other allergic rhinitis: Secondary | ICD-10-CM | POA: Diagnosis not present

## 2015-10-12 DIAGNOSIS — J301 Allergic rhinitis due to pollen: Secondary | ICD-10-CM | POA: Diagnosis not present

## 2015-10-16 DIAGNOSIS — J3089 Other allergic rhinitis: Secondary | ICD-10-CM | POA: Diagnosis not present

## 2015-10-16 DIAGNOSIS — J301 Allergic rhinitis due to pollen: Secondary | ICD-10-CM | POA: Diagnosis not present

## 2015-10-20 DIAGNOSIS — Z23 Encounter for immunization: Secondary | ICD-10-CM | POA: Diagnosis not present

## 2015-10-20 DIAGNOSIS — J301 Allergic rhinitis due to pollen: Secondary | ICD-10-CM | POA: Diagnosis not present

## 2015-10-20 DIAGNOSIS — J3089 Other allergic rhinitis: Secondary | ICD-10-CM | POA: Diagnosis not present

## 2015-10-30 DIAGNOSIS — J3089 Other allergic rhinitis: Secondary | ICD-10-CM | POA: Diagnosis not present

## 2015-10-30 DIAGNOSIS — J301 Allergic rhinitis due to pollen: Secondary | ICD-10-CM | POA: Diagnosis not present

## 2015-11-04 DIAGNOSIS — J301 Allergic rhinitis due to pollen: Secondary | ICD-10-CM | POA: Diagnosis not present

## 2015-11-04 DIAGNOSIS — J4530 Mild persistent asthma, uncomplicated: Secondary | ICD-10-CM | POA: Diagnosis not present

## 2015-11-04 DIAGNOSIS — J3089 Other allergic rhinitis: Secondary | ICD-10-CM | POA: Diagnosis not present

## 2015-11-04 DIAGNOSIS — K219 Gastro-esophageal reflux disease without esophagitis: Secondary | ICD-10-CM | POA: Diagnosis not present

## 2015-11-06 ENCOUNTER — Other Ambulatory Visit: Payer: Self-pay | Admitting: Pulmonary Disease

## 2015-11-10 DIAGNOSIS — J3089 Other allergic rhinitis: Secondary | ICD-10-CM | POA: Diagnosis not present

## 2015-11-10 DIAGNOSIS — J301 Allergic rhinitis due to pollen: Secondary | ICD-10-CM | POA: Diagnosis not present

## 2015-11-13 ENCOUNTER — Telehealth: Payer: Self-pay | Admitting: Pulmonary Disease

## 2015-11-13 MED ORDER — OMEPRAZOLE 20 MG PO CPDR: 20.0000 mg | DELAYED_RELEASE_CAPSULE | Freq: Every day | ORAL | 0 refills | Status: DC

## 2015-11-13 NOTE — Telephone Encounter (Signed)
Called spoke with patient 1 refill sent to verified pharmacy Pt is aware to keep the 11.14.17 appt to continue receiving refills Nothing further needed; will sign off

## 2015-11-17 ENCOUNTER — Ambulatory Visit (INDEPENDENT_AMBULATORY_CARE_PROVIDER_SITE_OTHER): Payer: Medicare Other | Admitting: Pulmonary Disease

## 2015-11-17 ENCOUNTER — Encounter: Payer: Self-pay | Admitting: Pulmonary Disease

## 2015-11-17 DIAGNOSIS — R05 Cough: Secondary | ICD-10-CM

## 2015-11-17 DIAGNOSIS — K219 Gastro-esophageal reflux disease without esophagitis: Secondary | ICD-10-CM | POA: Diagnosis not present

## 2015-11-17 DIAGNOSIS — J4540 Moderate persistent asthma, uncomplicated: Secondary | ICD-10-CM

## 2015-11-17 DIAGNOSIS — R059 Cough, unspecified: Secondary | ICD-10-CM

## 2015-11-17 MED ORDER — NYSTATIN 100000 UNIT/ML MT SUSP: 5.0000 mL | Freq: Two times a day (BID) | OROMUCOSAL | 0 refills | Status: AC

## 2015-11-17 NOTE — Assessment & Plan Note (Signed)
Trial of decreasing Advair once daily for 3 months In February, stop Advair altogether  Call us for symptoms of chest tightness or wheezing

## 2015-11-17 NOTE — Progress Notes (Signed)
   Subjective:    Patient ID: [name], female    DOB: 02/06/1950, 65 y.o.   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 Follow-up    Pt. states her breathing is doing well,   No flareups in the past year She continues to have occasional throat clearing and dry cough She had breakthrough reflux symptoms when she was out of omeprazole for a couple of days She remains on allergy shots ( sharma) She has been maintained on Advair for more than 12 years but now this is getting very expensive   Significant tests/ events Spirometry 2010 normal CT sinuses 09/2013 normal   Review of Systems neg for any significant sore throat, dysphagia, itching, sneezing, nasal congestion or excess/ purulent secretions, fever, chills, sweats, unintended wt loss, pleuritic or exertional cp, hempoptysis, orthopnea pnd or change in chronic leg swelling. Also denies presyncope, palpitations, heartburn, abdominal pain, nausea, vomiting, diarrhea or change in bowel or urinary habits, dysuria,hematuria, rash, arthralgias, visual complaints, headache, numbness weakness or ataxia.     Objective:   Physical Exam   Gen. Pleasant, well-nourished, in no distress ENT - no lesions, no post nasal drip Neck: No JVD, no thyromegaly, no carotid bruits Lungs: no use of accessory muscles, no dullness to percussion, clear without rales or rhonchi  Cardiovascular: Rhythm regular, heart sounds  normal, no murmurs or gallops, no peripheral edema Musculoskeletal: No deformities, no cyanosis or clubbing         Assessment & Plan:

## 2015-11-17 NOTE — Assessment & Plan Note (Signed)
Refills on omeprazole.  

## 2015-11-17 NOTE — Assessment & Plan Note (Signed)
For thrush  Nystatin 5 ML twice daily swish and swallow for 7 days

## 2015-11-17 NOTE — Patient Instructions (Signed)
Trial of decreasing Advair once daily for 3 months In February, stop Advair altogether  Call us for symptoms of chest tightness or wheezing  Nystatin 5 ML twice daily swish and swallow for 7 days

## 2015-11-17 NOTE — Addendum Note (Signed)
Addended by: Benson Setting L on: 11/17/2015 01:47 PM   Modules accepted: Orders

## 2015-11-24 DIAGNOSIS — J301 Allergic rhinitis due to pollen: Secondary | ICD-10-CM | POA: Diagnosis not present

## 2015-11-24 DIAGNOSIS — J3089 Other allergic rhinitis: Secondary | ICD-10-CM | POA: Diagnosis not present

## 2015-12-01 DIAGNOSIS — J3089 Other allergic rhinitis: Secondary | ICD-10-CM | POA: Diagnosis not present

## 2015-12-01 DIAGNOSIS — J301 Allergic rhinitis due to pollen: Secondary | ICD-10-CM | POA: Diagnosis not present

## 2015-12-02 ENCOUNTER — Other Ambulatory Visit: Payer: Self-pay | Admitting: Pulmonary Disease

## 2015-12-18 DIAGNOSIS — J3089 Other allergic rhinitis: Secondary | ICD-10-CM | POA: Diagnosis not present

## 2015-12-18 DIAGNOSIS — J301 Allergic rhinitis due to pollen: Secondary | ICD-10-CM | POA: Diagnosis not present

## 2015-12-25 DIAGNOSIS — J3089 Other allergic rhinitis: Secondary | ICD-10-CM | POA: Diagnosis not present

## 2015-12-25 DIAGNOSIS — J301 Allergic rhinitis due to pollen: Secondary | ICD-10-CM | POA: Diagnosis not present

## 2016-01-01 DIAGNOSIS — J3089 Other allergic rhinitis: Secondary | ICD-10-CM | POA: Diagnosis not present

## 2016-01-01 DIAGNOSIS — J301 Allergic rhinitis due to pollen: Secondary | ICD-10-CM | POA: Diagnosis not present

## 2016-01-08 DIAGNOSIS — J3089 Other allergic rhinitis: Secondary | ICD-10-CM | POA: Diagnosis not present

## 2016-01-08 DIAGNOSIS — J301 Allergic rhinitis due to pollen: Secondary | ICD-10-CM | POA: Diagnosis not present

## 2016-01-11 DIAGNOSIS — J3089 Other allergic rhinitis: Secondary | ICD-10-CM | POA: Diagnosis not present

## 2016-01-11 DIAGNOSIS — J301 Allergic rhinitis due to pollen: Secondary | ICD-10-CM | POA: Diagnosis not present

## 2016-01-14 DIAGNOSIS — J301 Allergic rhinitis due to pollen: Secondary | ICD-10-CM | POA: Diagnosis not present

## 2016-02-03 DIAGNOSIS — J301 Allergic rhinitis due to pollen: Secondary | ICD-10-CM | POA: Diagnosis not present

## 2016-02-03 DIAGNOSIS — J3089 Other allergic rhinitis: Secondary | ICD-10-CM | POA: Diagnosis not present

## 2016-02-15 DIAGNOSIS — J301 Allergic rhinitis due to pollen: Secondary | ICD-10-CM | POA: Diagnosis not present

## 2016-02-15 DIAGNOSIS — J3089 Other allergic rhinitis: Secondary | ICD-10-CM | POA: Diagnosis not present

## 2016-02-24 DIAGNOSIS — J3089 Other allergic rhinitis: Secondary | ICD-10-CM | POA: Diagnosis not present

## 2016-02-24 DIAGNOSIS — J301 Allergic rhinitis due to pollen: Secondary | ICD-10-CM | POA: Diagnosis not present

## 2016-02-29 DIAGNOSIS — J3089 Other allergic rhinitis: Secondary | ICD-10-CM | POA: Diagnosis not present

## 2016-03-01 DIAGNOSIS — J301 Allergic rhinitis due to pollen: Secondary | ICD-10-CM | POA: Diagnosis not present

## 2016-03-01 DIAGNOSIS — J3089 Other allergic rhinitis: Secondary | ICD-10-CM | POA: Diagnosis not present

## 2016-03-03 DIAGNOSIS — J301 Allergic rhinitis due to pollen: Secondary | ICD-10-CM | POA: Diagnosis not present

## 2016-03-07 DIAGNOSIS — J3089 Other allergic rhinitis: Secondary | ICD-10-CM | POA: Diagnosis not present

## 2016-03-07 DIAGNOSIS — J301 Allergic rhinitis due to pollen: Secondary | ICD-10-CM | POA: Diagnosis not present

## 2016-03-09 DIAGNOSIS — J301 Allergic rhinitis due to pollen: Secondary | ICD-10-CM | POA: Diagnosis not present

## 2016-03-15 DIAGNOSIS — J301 Allergic rhinitis due to pollen: Secondary | ICD-10-CM | POA: Diagnosis not present

## 2016-03-15 DIAGNOSIS — J3089 Other allergic rhinitis: Secondary | ICD-10-CM | POA: Diagnosis not present

## 2016-03-23 DIAGNOSIS — J3089 Other allergic rhinitis: Secondary | ICD-10-CM | POA: Diagnosis not present

## 2016-03-23 DIAGNOSIS — J301 Allergic rhinitis due to pollen: Secondary | ICD-10-CM | POA: Diagnosis not present

## 2016-03-25 DIAGNOSIS — J3089 Other allergic rhinitis: Secondary | ICD-10-CM | POA: Diagnosis not present

## 2016-03-28 DIAGNOSIS — J3089 Other allergic rhinitis: Secondary | ICD-10-CM | POA: Diagnosis not present

## 2016-03-28 DIAGNOSIS — J301 Allergic rhinitis due to pollen: Secondary | ICD-10-CM | POA: Diagnosis not present

## 2016-03-30 DIAGNOSIS — J3089 Other allergic rhinitis: Secondary | ICD-10-CM | POA: Diagnosis not present

## 2016-04-08 DIAGNOSIS — J301 Allergic rhinitis due to pollen: Secondary | ICD-10-CM | POA: Diagnosis not present

## 2016-04-08 DIAGNOSIS — J3089 Other allergic rhinitis: Secondary | ICD-10-CM | POA: Diagnosis not present

## 2016-04-08 DIAGNOSIS — Z8612 Personal history of poliomyelitis: Secondary | ICD-10-CM | POA: Diagnosis not present

## 2016-04-14 DIAGNOSIS — J3089 Other allergic rhinitis: Secondary | ICD-10-CM | POA: Diagnosis not present

## 2016-04-14 DIAGNOSIS — J301 Allergic rhinitis due to pollen: Secondary | ICD-10-CM | POA: Diagnosis not present

## 2016-04-15 DIAGNOSIS — H40013 Open angle with borderline findings, low risk, bilateral: Secondary | ICD-10-CM | POA: Diagnosis not present

## 2016-04-20 DIAGNOSIS — J301 Allergic rhinitis due to pollen: Secondary | ICD-10-CM | POA: Diagnosis not present

## 2016-04-20 DIAGNOSIS — J3089 Other allergic rhinitis: Secondary | ICD-10-CM | POA: Diagnosis not present

## 2016-04-21 DIAGNOSIS — L821 Other seborrheic keratosis: Secondary | ICD-10-CM | POA: Diagnosis not present

## 2016-04-25 DIAGNOSIS — J3089 Other allergic rhinitis: Secondary | ICD-10-CM | POA: Diagnosis not present

## 2016-04-25 DIAGNOSIS — J301 Allergic rhinitis due to pollen: Secondary | ICD-10-CM | POA: Diagnosis not present

## 2016-04-28 DIAGNOSIS — Z77018 Contact with and (suspected) exposure to other hazardous metals: Secondary | ICD-10-CM | POA: Diagnosis not present

## 2016-04-28 DIAGNOSIS — Z23 Encounter for immunization: Secondary | ICD-10-CM | POA: Diagnosis not present

## 2016-04-28 DIAGNOSIS — M810 Age-related osteoporosis without current pathological fracture: Secondary | ICD-10-CM | POA: Diagnosis not present

## 2016-04-28 DIAGNOSIS — E559 Vitamin D deficiency, unspecified: Secondary | ICD-10-CM | POA: Diagnosis not present

## 2016-04-28 DIAGNOSIS — E785 Hyperlipidemia, unspecified: Secondary | ICD-10-CM | POA: Diagnosis not present

## 2016-04-28 DIAGNOSIS — Z0000 Encounter for general adult medical examination without abnormal findings: Secondary | ICD-10-CM | POA: Diagnosis not present

## 2016-04-28 DIAGNOSIS — E538 Deficiency of other specified B group vitamins: Secondary | ICD-10-CM | POA: Diagnosis not present

## 2016-04-28 DIAGNOSIS — T56891A Toxic effect of other metals, accidental (unintentional), initial encounter: Secondary | ICD-10-CM | POA: Diagnosis not present

## 2016-04-28 DIAGNOSIS — I10 Essential (primary) hypertension: Secondary | ICD-10-CM | POA: Diagnosis not present

## 2016-05-04 DIAGNOSIS — Z77018 Contact with and (suspected) exposure to other hazardous metals: Secondary | ICD-10-CM | POA: Diagnosis not present

## 2016-05-04 DIAGNOSIS — J3089 Other allergic rhinitis: Secondary | ICD-10-CM | POA: Diagnosis not present

## 2016-05-04 DIAGNOSIS — J301 Allergic rhinitis due to pollen: Secondary | ICD-10-CM | POA: Diagnosis not present

## 2016-05-11 DIAGNOSIS — J301 Allergic rhinitis due to pollen: Secondary | ICD-10-CM | POA: Diagnosis not present

## 2016-05-11 DIAGNOSIS — J3089 Other allergic rhinitis: Secondary | ICD-10-CM | POA: Diagnosis not present

## 2016-05-17 DIAGNOSIS — J301 Allergic rhinitis due to pollen: Secondary | ICD-10-CM | POA: Diagnosis not present

## 2016-05-17 DIAGNOSIS — J3089 Other allergic rhinitis: Secondary | ICD-10-CM | POA: Diagnosis not present

## 2016-05-26 DIAGNOSIS — J301 Allergic rhinitis due to pollen: Secondary | ICD-10-CM | POA: Diagnosis not present

## 2016-05-26 DIAGNOSIS — J3089 Other allergic rhinitis: Secondary | ICD-10-CM | POA: Diagnosis not present

## 2016-06-01 DIAGNOSIS — J301 Allergic rhinitis due to pollen: Secondary | ICD-10-CM | POA: Diagnosis not present

## 2016-06-01 DIAGNOSIS — J3089 Other allergic rhinitis: Secondary | ICD-10-CM | POA: Diagnosis not present

## 2016-06-08 ENCOUNTER — Other Ambulatory Visit: Payer: Self-pay | Admitting: Pulmonary Disease

## 2016-06-10 DIAGNOSIS — J301 Allergic rhinitis due to pollen: Secondary | ICD-10-CM | POA: Diagnosis not present

## 2016-06-10 DIAGNOSIS — J3089 Other allergic rhinitis: Secondary | ICD-10-CM | POA: Diagnosis not present

## 2016-06-15 DIAGNOSIS — J301 Allergic rhinitis due to pollen: Secondary | ICD-10-CM | POA: Diagnosis not present

## 2016-06-15 DIAGNOSIS — J3089 Other allergic rhinitis: Secondary | ICD-10-CM | POA: Diagnosis not present

## 2016-06-27 DIAGNOSIS — J3089 Other allergic rhinitis: Secondary | ICD-10-CM | POA: Diagnosis not present

## 2016-06-27 DIAGNOSIS — J301 Allergic rhinitis due to pollen: Secondary | ICD-10-CM | POA: Diagnosis not present

## 2016-07-05 DIAGNOSIS — J3089 Other allergic rhinitis: Secondary | ICD-10-CM | POA: Diagnosis not present

## 2016-07-05 DIAGNOSIS — J301 Allergic rhinitis due to pollen: Secondary | ICD-10-CM | POA: Diagnosis not present

## 2016-07-11 DIAGNOSIS — J301 Allergic rhinitis due to pollen: Secondary | ICD-10-CM | POA: Diagnosis not present

## 2016-07-15 DIAGNOSIS — J301 Allergic rhinitis due to pollen: Secondary | ICD-10-CM | POA: Diagnosis not present

## 2016-07-15 DIAGNOSIS — J3089 Other allergic rhinitis: Secondary | ICD-10-CM | POA: Diagnosis not present

## 2016-07-22 DIAGNOSIS — J301 Allergic rhinitis due to pollen: Secondary | ICD-10-CM | POA: Diagnosis not present

## 2016-07-22 DIAGNOSIS — J3089 Other allergic rhinitis: Secondary | ICD-10-CM | POA: Diagnosis not present

## 2016-07-29 DIAGNOSIS — J301 Allergic rhinitis due to pollen: Secondary | ICD-10-CM | POA: Diagnosis not present

## 2016-07-29 DIAGNOSIS — J3089 Other allergic rhinitis: Secondary | ICD-10-CM | POA: Diagnosis not present

## 2016-08-02 DIAGNOSIS — J301 Allergic rhinitis due to pollen: Secondary | ICD-10-CM | POA: Diagnosis not present

## 2016-08-02 DIAGNOSIS — J3089 Other allergic rhinitis: Secondary | ICD-10-CM | POA: Diagnosis not present

## 2016-08-05 DIAGNOSIS — J3089 Other allergic rhinitis: Secondary | ICD-10-CM | POA: Diagnosis not present

## 2016-08-05 DIAGNOSIS — J301 Allergic rhinitis due to pollen: Secondary | ICD-10-CM | POA: Diagnosis not present

## 2016-08-09 DIAGNOSIS — J301 Allergic rhinitis due to pollen: Secondary | ICD-10-CM | POA: Diagnosis not present

## 2016-08-09 DIAGNOSIS — J3089 Other allergic rhinitis: Secondary | ICD-10-CM | POA: Diagnosis not present

## 2016-08-19 DIAGNOSIS — J301 Allergic rhinitis due to pollen: Secondary | ICD-10-CM | POA: Diagnosis not present

## 2016-08-19 DIAGNOSIS — J3089 Other allergic rhinitis: Secondary | ICD-10-CM | POA: Diagnosis not present

## 2016-08-23 DIAGNOSIS — J3089 Other allergic rhinitis: Secondary | ICD-10-CM | POA: Diagnosis not present

## 2016-08-23 DIAGNOSIS — J301 Allergic rhinitis due to pollen: Secondary | ICD-10-CM | POA: Diagnosis not present

## 2016-08-30 DIAGNOSIS — J301 Allergic rhinitis due to pollen: Secondary | ICD-10-CM | POA: Diagnosis not present

## 2016-08-30 DIAGNOSIS — J3089 Other allergic rhinitis: Secondary | ICD-10-CM | POA: Diagnosis not present

## 2016-09-01 ENCOUNTER — Other Ambulatory Visit: Payer: Self-pay | Admitting: Family Medicine

## 2016-09-01 DIAGNOSIS — Z1231 Encounter for screening mammogram for malignant neoplasm of breast: Secondary | ICD-10-CM

## 2016-09-01 DIAGNOSIS — J3089 Other allergic rhinitis: Secondary | ICD-10-CM | POA: Diagnosis not present

## 2016-09-06 DIAGNOSIS — J301 Allergic rhinitis due to pollen: Secondary | ICD-10-CM | POA: Diagnosis not present

## 2016-09-06 DIAGNOSIS — J3089 Other allergic rhinitis: Secondary | ICD-10-CM | POA: Diagnosis not present

## 2016-09-14 DIAGNOSIS — J3089 Other allergic rhinitis: Secondary | ICD-10-CM | POA: Diagnosis not present

## 2016-09-14 DIAGNOSIS — J301 Allergic rhinitis due to pollen: Secondary | ICD-10-CM | POA: Diagnosis not present

## 2016-09-23 DIAGNOSIS — J3089 Other allergic rhinitis: Secondary | ICD-10-CM | POA: Diagnosis not present

## 2016-09-23 DIAGNOSIS — J301 Allergic rhinitis due to pollen: Secondary | ICD-10-CM | POA: Diagnosis not present

## 2016-09-27 DIAGNOSIS — J3089 Other allergic rhinitis: Secondary | ICD-10-CM | POA: Diagnosis not present

## 2016-09-27 DIAGNOSIS — J301 Allergic rhinitis due to pollen: Secondary | ICD-10-CM | POA: Diagnosis not present

## 2016-10-04 DIAGNOSIS — J3089 Other allergic rhinitis: Secondary | ICD-10-CM | POA: Diagnosis not present

## 2016-10-04 DIAGNOSIS — J301 Allergic rhinitis due to pollen: Secondary | ICD-10-CM | POA: Diagnosis not present

## 2016-10-10 ENCOUNTER — Ambulatory Visit
Admission: RE | Admit: 2016-10-10 | Discharge: 2016-10-10 | Disposition: A | Discharge status: Home / Self Care | Payer: Medicare Other | Source: Ambulatory Visit | Attending: Family Medicine | Admitting: Family Medicine

## 2016-10-10 DIAGNOSIS — Z1231 Encounter for screening mammogram for malignant neoplasm of breast: Secondary | ICD-10-CM | POA: Diagnosis not present

## 2016-10-12 DIAGNOSIS — H35033 Hypertensive retinopathy, bilateral: Secondary | ICD-10-CM | POA: Diagnosis not present

## 2016-10-12 DIAGNOSIS — H40013 Open angle with borderline findings, low risk, bilateral: Secondary | ICD-10-CM | POA: Diagnosis not present

## 2016-10-12 DIAGNOSIS — H40033 Anatomical narrow angle, bilateral: Secondary | ICD-10-CM | POA: Diagnosis not present

## 2016-10-12 DIAGNOSIS — H5310 Unspecified subjective visual disturbances: Secondary | ICD-10-CM | POA: Diagnosis not present

## 2016-10-14 DIAGNOSIS — J3089 Other allergic rhinitis: Secondary | ICD-10-CM | POA: Diagnosis not present

## 2016-10-14 DIAGNOSIS — J301 Allergic rhinitis due to pollen: Secondary | ICD-10-CM | POA: Diagnosis not present

## 2016-10-20 DIAGNOSIS — J301 Allergic rhinitis due to pollen: Secondary | ICD-10-CM | POA: Diagnosis not present

## 2016-10-20 DIAGNOSIS — J3089 Other allergic rhinitis: Secondary | ICD-10-CM | POA: Diagnosis not present

## 2016-10-28 DIAGNOSIS — J301 Allergic rhinitis due to pollen: Secondary | ICD-10-CM | POA: Diagnosis not present

## 2016-10-28 DIAGNOSIS — J3089 Other allergic rhinitis: Secondary | ICD-10-CM | POA: Diagnosis not present

## 2016-11-02 ENCOUNTER — Telehealth: Payer: Self-pay | Admitting: Pulmonary Disease

## 2016-11-02 MED ORDER — LEVALBUTEROL HCL 0.63 MG/3ML IN NEBU: 0.6300 mg | INHALATION_SOLUTION | RESPIRATORY_TRACT | 1 refills | Status: DC | PRN

## 2016-11-02 NOTE — Telephone Encounter (Signed)
Spoke with pt and advised rx sent to pharmacy (Levalbuterol). Nothing further is needed.

## 2016-11-04 DIAGNOSIS — J301 Allergic rhinitis due to pollen: Secondary | ICD-10-CM | POA: Diagnosis not present

## 2016-11-04 DIAGNOSIS — J3089 Other allergic rhinitis: Secondary | ICD-10-CM | POA: Diagnosis not present

## 2016-11-10 ENCOUNTER — Ambulatory Visit (INDEPENDENT_AMBULATORY_CARE_PROVIDER_SITE_OTHER): Payer: Medicare Other | Admitting: Pulmonary Disease

## 2016-11-10 ENCOUNTER — Encounter: Payer: Self-pay | Admitting: Pulmonary Disease

## 2016-11-10 DIAGNOSIS — K219 Gastro-esophageal reflux disease without esophagitis: Secondary | ICD-10-CM

## 2016-11-10 DIAGNOSIS — J301 Allergic rhinitis due to pollen: Secondary | ICD-10-CM | POA: Diagnosis not present

## 2016-11-10 DIAGNOSIS — J4540 Moderate persistent asthma, uncomplicated: Secondary | ICD-10-CM

## 2016-11-10 DIAGNOSIS — Z23 Encounter for immunization: Secondary | ICD-10-CM | POA: Diagnosis not present

## 2016-11-10 DIAGNOSIS — J3089 Other allergic rhinitis: Secondary | ICD-10-CM | POA: Diagnosis not present

## 2016-11-10 MED ORDER — FLUTICASONE PROPIONATE 50 MCG/ACT NA SUSP: 2.0000 | Freq: Two times a day (BID) | NASAL | 2 refills | Status: DC

## 2016-11-10 MED ORDER — MONTELUKAST SODIUM 10 MG PO TABS: 10.0000 mg | ORAL_TABLET | Freq: Every day | ORAL | 3 refills | Status: DC

## 2016-11-10 MED ORDER — BUDESONIDE-FORMOTEROL FUMARATE 80-4.5 MCG/ACT IN AERO: 2.0000 | INHALATION_SPRAY | Freq: Two times a day (BID) | RESPIRATORY_TRACT | 0 refills | Status: DC

## 2016-11-10 NOTE — Patient Instructions (Addendum)
Refills on flonase & singulair Trial of symbicort 80 - 2 puffs twice daily - call back for Rx if this works

## 2016-11-10 NOTE — Assessment & Plan Note (Signed)
Refills on flonase & singulair He does seem like she will need maintenance medications for persistent symptoms, step down trial did not work.  Her main complaint is hoarseness of voice which is probably related to inhaled steroid  Trial of symbicort 80 - 2 puffs twice daily - call back for Rx if this works

## 2016-11-10 NOTE — Assessment & Plan Note (Signed)
-   Continue Flonase  °

## 2016-11-10 NOTE — Progress Notes (Signed)
   Subjective:    Patient ID: [name], female    DOB: June 30, 1950, 66 y.o.   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 After last visit 11/2015, Advair was tapered to off by February 2018 , which she had to start back on this after 2 months due to recurrent symptoms.  She reports occasional hoarseness of voice.  She remains on allergy shots ( sharma) and wonders if she still needs skin testing She has been maintained on Advair for more than 12 years but now is in the donut hole.  About 2 weeks ago she had a flare and started using prednisone and amoxicillin that she had leftover, she feels 80% improved but has some persistent wheezing on forced exhalation  She denies breakthrough reflux symptoms on omeprazole Significant tests/ events Spirometry 2010 normal CT sinuses 09/2013 normal   Review of Systems neg for any significant sore throat, dysphagia, itching, sneezing, nasal congestion or excess/ purulent secretions, fever, chills, sweats, unintended wt loss, pleuritic or exertional cp, hempoptysis, orthopnea pnd or change in chronic leg swelling. Also denies presyncope, palpitations, heartburn, abdominal pain, nausea, vomiting, diarrhea or change in bowel or urinary habits, dysuria,hematuria, rash, arthralgias, visual complaints, headache, numbness weakness or ataxia.     Objective:   Physical Exam   Gen. Pleasant, obese, in no distress ENT - no lesions, no post nasal drip Neck: No JVD, no thyromegaly, no carotid bruits Lungs: no use of accessory muscles, no dullness to percussion, decreased without rales or rhonchi  Cardiovascular: Rhythm regular, heart sounds  normal, no murmurs or gallops, no peripheral edema Musculoskeletal: No deformities, no cyanosis or clubbing , no tremors        Assessment & Plan:

## 2016-11-10 NOTE — Assessment & Plan Note (Signed)
Continue omeprazole 

## 2016-11-11 ENCOUNTER — Ambulatory Visit: Payer: Medicare Other | Admitting: Pulmonary Disease

## 2016-11-14 DIAGNOSIS — J301 Allergic rhinitis due to pollen: Secondary | ICD-10-CM | POA: Diagnosis not present

## 2016-11-14 DIAGNOSIS — J3089 Other allergic rhinitis: Secondary | ICD-10-CM | POA: Diagnosis not present

## 2016-11-17 ENCOUNTER — Telehealth: Payer: Self-pay | Admitting: Pulmonary Disease

## 2016-11-17 MED ORDER — ALBUTEROL SULFATE (2.5 MG/3ML) 0.083% IN NEBU: 2.5000 mg | INHALATION_SOLUTION | Freq: Four times a day (QID) | RESPIRATORY_TRACT | 0 refills | Status: DC | PRN

## 2016-11-17 MED ORDER — BUDESONIDE-FORMOTEROL FUMARATE 80-4.5 MCG/ACT IN AERO: 2.0000 | INHALATION_SPRAY | Freq: Two times a day (BID) | RESPIRATORY_TRACT | 2 refills | Status: DC

## 2016-11-17 NOTE — Telephone Encounter (Signed)
Spoke with pt, who states Symbicort 80 is effective. Rx has been sent to preferred pharmacy for Symbicort 80.  Pt also states she discussed with RA during her OV about starting a different neb solution, as Xopenex is too expensive.  Pt states she can not recall the name of this medication. Pt would like neb solution sent to [facility].  RA please advise on neb solution. Thanks.

## 2016-11-17 NOTE — Telephone Encounter (Signed)
Albuterol neb q6h prn wheezing #30

## 2016-11-17 NOTE — Telephone Encounter (Signed)
Rx has been sent to preferred pharmacy.  Pt is aware and voiced her understanding. Nothing further needed.  

## 2016-11-23 DIAGNOSIS — J3089 Other allergic rhinitis: Secondary | ICD-10-CM | POA: Diagnosis not present

## 2016-11-23 DIAGNOSIS — J301 Allergic rhinitis due to pollen: Secondary | ICD-10-CM | POA: Diagnosis not present

## 2016-11-30 DIAGNOSIS — J3089 Other allergic rhinitis: Secondary | ICD-10-CM | POA: Diagnosis not present

## 2016-11-30 DIAGNOSIS — J301 Allergic rhinitis due to pollen: Secondary | ICD-10-CM | POA: Diagnosis not present

## 2016-12-05 DIAGNOSIS — J301 Allergic rhinitis due to pollen: Secondary | ICD-10-CM | POA: Diagnosis not present

## 2016-12-12 ENCOUNTER — Other Ambulatory Visit: Payer: Self-pay | Admitting: Pulmonary Disease

## 2016-12-13 DIAGNOSIS — J3089 Other allergic rhinitis: Secondary | ICD-10-CM | POA: Diagnosis not present

## 2016-12-13 DIAGNOSIS — J301 Allergic rhinitis due to pollen: Secondary | ICD-10-CM | POA: Diagnosis not present

## 2016-12-23 DIAGNOSIS — J3089 Other allergic rhinitis: Secondary | ICD-10-CM | POA: Diagnosis not present

## 2016-12-23 DIAGNOSIS — J301 Allergic rhinitis due to pollen: Secondary | ICD-10-CM | POA: Diagnosis not present

## 2016-12-30 DIAGNOSIS — J3089 Other allergic rhinitis: Secondary | ICD-10-CM | POA: Diagnosis not present

## 2016-12-30 DIAGNOSIS — J301 Allergic rhinitis due to pollen: Secondary | ICD-10-CM | POA: Diagnosis not present

## 2017-01-04 DIAGNOSIS — J301 Allergic rhinitis due to pollen: Secondary | ICD-10-CM | POA: Diagnosis not present

## 2017-01-04 DIAGNOSIS — J3089 Other allergic rhinitis: Secondary | ICD-10-CM | POA: Diagnosis not present

## 2017-01-11 DIAGNOSIS — J301 Allergic rhinitis due to pollen: Secondary | ICD-10-CM | POA: Diagnosis not present

## 2017-01-11 DIAGNOSIS — J3089 Other allergic rhinitis: Secondary | ICD-10-CM | POA: Diagnosis not present

## 2017-01-13 DIAGNOSIS — J301 Allergic rhinitis due to pollen: Secondary | ICD-10-CM | POA: Diagnosis not present

## 2017-01-13 DIAGNOSIS — J3089 Other allergic rhinitis: Secondary | ICD-10-CM | POA: Diagnosis not present

## 2017-01-17 DIAGNOSIS — J3089 Other allergic rhinitis: Secondary | ICD-10-CM | POA: Diagnosis not present

## 2017-01-17 DIAGNOSIS — J301 Allergic rhinitis due to pollen: Secondary | ICD-10-CM | POA: Diagnosis not present

## 2017-01-20 DIAGNOSIS — J3089 Other allergic rhinitis: Secondary | ICD-10-CM | POA: Diagnosis not present

## 2017-01-24 DIAGNOSIS — J3089 Other allergic rhinitis: Secondary | ICD-10-CM | POA: Diagnosis not present

## 2017-01-24 DIAGNOSIS — J301 Allergic rhinitis due to pollen: Secondary | ICD-10-CM | POA: Diagnosis not present

## 2017-01-31 DIAGNOSIS — J301 Allergic rhinitis due to pollen: Secondary | ICD-10-CM | POA: Diagnosis not present

## 2017-01-31 DIAGNOSIS — J3089 Other allergic rhinitis: Secondary | ICD-10-CM | POA: Diagnosis not present

## 2017-02-11 ENCOUNTER — Other Ambulatory Visit: Payer: Self-pay | Admitting: Pulmonary Disease

## 2017-02-13 ENCOUNTER — Other Ambulatory Visit: Payer: Self-pay | Admitting: Pulmonary Disease

## 2017-02-13 ENCOUNTER — Telehealth: Payer: Self-pay | Admitting: Pulmonary Disease

## 2017-02-13 DIAGNOSIS — L821 Other seborrheic keratosis: Secondary | ICD-10-CM | POA: Diagnosis not present

## 2017-02-13 MED ORDER — PREDNISONE 10 MG PO TABS: ORAL_TABLET | ORAL | 0 refills | Status: DC

## 2017-02-13 NOTE — Telephone Encounter (Signed)
Pt aware of recs.  rx sent to preferred pharmacy.  Nothing further needed.  

## 2017-02-13 NOTE — Telephone Encounter (Signed)
Prednisone 10 mg tabs  Take 2 tabs daily with food x 5ds, then 1 tab daily with food x 5ds then STOP  

## 2017-02-13 NOTE — Telephone Encounter (Signed)
Spoke with pt, c/o chest congestion, prod cough with clear mucus, fatigue, sob X1 week.  Pt states she has been bedbound X1 week d/t her symptoms.    Pt has been using nebulizer tx TID to help with s/s.  Requesting a pred taper.  Pt uses [facility].    RA please advise on recs.  Thanks.

## 2017-02-15 DIAGNOSIS — J301 Allergic rhinitis due to pollen: Secondary | ICD-10-CM | POA: Diagnosis not present

## 2017-02-15 DIAGNOSIS — J3089 Other allergic rhinitis: Secondary | ICD-10-CM | POA: Diagnosis not present

## 2017-02-20 DIAGNOSIS — J301 Allergic rhinitis due to pollen: Secondary | ICD-10-CM | POA: Diagnosis not present

## 2017-02-20 DIAGNOSIS — J3089 Other allergic rhinitis: Secondary | ICD-10-CM | POA: Diagnosis not present

## 2017-02-24 DIAGNOSIS — J3089 Other allergic rhinitis: Secondary | ICD-10-CM | POA: Diagnosis not present

## 2017-02-24 DIAGNOSIS — J301 Allergic rhinitis due to pollen: Secondary | ICD-10-CM | POA: Diagnosis not present

## 2017-03-03 DIAGNOSIS — J301 Allergic rhinitis due to pollen: Secondary | ICD-10-CM | POA: Diagnosis not present

## 2017-03-03 DIAGNOSIS — J3089 Other allergic rhinitis: Secondary | ICD-10-CM | POA: Diagnosis not present

## 2017-03-09 DIAGNOSIS — J301 Allergic rhinitis due to pollen: Secondary | ICD-10-CM | POA: Diagnosis not present

## 2017-03-09 DIAGNOSIS — J3089 Other allergic rhinitis: Secondary | ICD-10-CM | POA: Diagnosis not present

## 2017-03-17 DIAGNOSIS — J301 Allergic rhinitis due to pollen: Secondary | ICD-10-CM | POA: Diagnosis not present

## 2017-03-17 DIAGNOSIS — J3089 Other allergic rhinitis: Secondary | ICD-10-CM | POA: Diagnosis not present

## 2017-03-24 DIAGNOSIS — J3089 Other allergic rhinitis: Secondary | ICD-10-CM | POA: Diagnosis not present

## 2017-03-24 DIAGNOSIS — J301 Allergic rhinitis due to pollen: Secondary | ICD-10-CM | POA: Diagnosis not present

## 2017-03-31 DIAGNOSIS — J301 Allergic rhinitis due to pollen: Secondary | ICD-10-CM | POA: Diagnosis not present

## 2017-03-31 DIAGNOSIS — J3089 Other allergic rhinitis: Secondary | ICD-10-CM | POA: Diagnosis not present

## 2017-04-07 DIAGNOSIS — J301 Allergic rhinitis due to pollen: Secondary | ICD-10-CM | POA: Diagnosis not present

## 2017-04-07 DIAGNOSIS — K219 Gastro-esophageal reflux disease without esophagitis: Secondary | ICD-10-CM | POA: Diagnosis not present

## 2017-04-07 DIAGNOSIS — J4530 Mild persistent asthma, uncomplicated: Secondary | ICD-10-CM | POA: Diagnosis not present

## 2017-04-07 DIAGNOSIS — J3089 Other allergic rhinitis: Secondary | ICD-10-CM | POA: Diagnosis not present

## 2017-04-14 DIAGNOSIS — J3089 Other allergic rhinitis: Secondary | ICD-10-CM | POA: Diagnosis not present

## 2017-04-14 DIAGNOSIS — J301 Allergic rhinitis due to pollen: Secondary | ICD-10-CM | POA: Diagnosis not present

## 2017-04-17 DIAGNOSIS — J301 Allergic rhinitis due to pollen: Secondary | ICD-10-CM | POA: Diagnosis not present

## 2017-04-17 DIAGNOSIS — J3089 Other allergic rhinitis: Secondary | ICD-10-CM | POA: Diagnosis not present

## 2017-04-20 ENCOUNTER — Other Ambulatory Visit: Payer: Self-pay | Admitting: Pulmonary Disease

## 2017-05-05 DIAGNOSIS — J3089 Other allergic rhinitis: Secondary | ICD-10-CM | POA: Diagnosis not present

## 2017-05-05 DIAGNOSIS — J301 Allergic rhinitis due to pollen: Secondary | ICD-10-CM | POA: Diagnosis not present

## 2017-05-11 DIAGNOSIS — J3089 Other allergic rhinitis: Secondary | ICD-10-CM | POA: Diagnosis not present

## 2017-05-11 DIAGNOSIS — J301 Allergic rhinitis due to pollen: Secondary | ICD-10-CM | POA: Diagnosis not present

## 2017-05-15 DIAGNOSIS — Z77018 Contact with and (suspected) exposure to other hazardous metals: Secondary | ICD-10-CM | POA: Diagnosis not present

## 2017-05-15 DIAGNOSIS — Z0000 Encounter for general adult medical examination without abnormal findings: Secondary | ICD-10-CM | POA: Diagnosis not present

## 2017-05-15 DIAGNOSIS — T56891A Toxic effect of other metals, accidental (unintentional), initial encounter: Secondary | ICD-10-CM | POA: Diagnosis not present

## 2017-05-15 DIAGNOSIS — M810 Age-related osteoporosis without current pathological fracture: Secondary | ICD-10-CM | POA: Diagnosis not present

## 2017-05-15 DIAGNOSIS — E538 Deficiency of other specified B group vitamins: Secondary | ICD-10-CM | POA: Diagnosis not present

## 2017-05-15 DIAGNOSIS — E785 Hyperlipidemia, unspecified: Secondary | ICD-10-CM | POA: Diagnosis not present

## 2017-05-15 DIAGNOSIS — E559 Vitamin D deficiency, unspecified: Secondary | ICD-10-CM | POA: Diagnosis not present

## 2017-05-15 DIAGNOSIS — I10 Essential (primary) hypertension: Secondary | ICD-10-CM | POA: Diagnosis not present

## 2017-05-15 DIAGNOSIS — E611 Iron deficiency: Secondary | ICD-10-CM | POA: Diagnosis not present

## 2017-05-16 ENCOUNTER — Ambulatory Visit (INDEPENDENT_AMBULATORY_CARE_PROVIDER_SITE_OTHER): Payer: Medicare Other | Admitting: Orthotics

## 2017-05-16 DIAGNOSIS — Q829 Congenital malformation of skin, unspecified: Secondary | ICD-10-CM

## 2017-05-16 NOTE — Progress Notes (Signed)
Orders shoes self pay [employer]. [address] 7l.5 W

## 2017-05-18 DIAGNOSIS — J301 Allergic rhinitis due to pollen: Secondary | ICD-10-CM | POA: Diagnosis not present

## 2017-05-18 DIAGNOSIS — J3089 Other allergic rhinitis: Secondary | ICD-10-CM | POA: Diagnosis not present

## 2017-05-22 DIAGNOSIS — J301 Allergic rhinitis due to pollen: Secondary | ICD-10-CM | POA: Diagnosis not present

## 2017-05-22 DIAGNOSIS — J3089 Other allergic rhinitis: Secondary | ICD-10-CM | POA: Diagnosis not present

## 2017-05-23 ENCOUNTER — Other Ambulatory Visit: Payer: Self-pay | Admitting: Pulmonary Disease

## 2017-05-24 DIAGNOSIS — J3089 Other allergic rhinitis: Secondary | ICD-10-CM | POA: Diagnosis not present

## 2017-05-24 DIAGNOSIS — J301 Allergic rhinitis due to pollen: Secondary | ICD-10-CM | POA: Diagnosis not present

## 2017-05-25 ENCOUNTER — Ambulatory Visit: Payer: Medicare Other | Admitting: Orthotics

## 2017-05-25 DIAGNOSIS — Q829 Congenital malformation of skin, unspecified: Secondary | ICD-10-CM

## 2017-05-25 NOTE — Progress Notes (Signed)
Patient picked up self pay shoes. 

## 2017-05-30 ENCOUNTER — Other Ambulatory Visit: Payer: Self-pay

## 2017-06-01 ENCOUNTER — Other Ambulatory Visit: Payer: Medicare Other | Admitting: Orthotics

## 2017-06-01 DIAGNOSIS — J301 Allergic rhinitis due to pollen: Secondary | ICD-10-CM | POA: Diagnosis not present

## 2017-06-01 DIAGNOSIS — J3089 Other allergic rhinitis: Secondary | ICD-10-CM | POA: Diagnosis not present

## 2017-06-05 DIAGNOSIS — L918 Other hypertrophic disorders of the skin: Secondary | ICD-10-CM | POA: Diagnosis not present

## 2017-06-05 DIAGNOSIS — L565 Disseminated superficial actinic porokeratosis (DSAP): Secondary | ICD-10-CM | POA: Diagnosis not present

## 2017-06-05 DIAGNOSIS — J3089 Other allergic rhinitis: Secondary | ICD-10-CM | POA: Diagnosis not present

## 2017-06-05 DIAGNOSIS — L821 Other seborrheic keratosis: Secondary | ICD-10-CM | POA: Diagnosis not present

## 2017-06-05 DIAGNOSIS — L570 Actinic keratosis: Secondary | ICD-10-CM | POA: Diagnosis not present

## 2017-06-05 DIAGNOSIS — J301 Allergic rhinitis due to pollen: Secondary | ICD-10-CM | POA: Diagnosis not present

## 2017-06-05 DIAGNOSIS — D485 Neoplasm of uncertain behavior of skin: Secondary | ICD-10-CM | POA: Diagnosis not present

## 2017-06-14 DIAGNOSIS — J3089 Other allergic rhinitis: Secondary | ICD-10-CM | POA: Diagnosis not present

## 2017-06-14 DIAGNOSIS — J301 Allergic rhinitis due to pollen: Secondary | ICD-10-CM | POA: Diagnosis not present

## 2017-06-15 ENCOUNTER — Other Ambulatory Visit: Payer: Self-pay | Admitting: Pulmonary Disease

## 2017-06-16 DIAGNOSIS — J301 Allergic rhinitis due to pollen: Secondary | ICD-10-CM | POA: Diagnosis not present

## 2017-06-16 DIAGNOSIS — J3089 Other allergic rhinitis: Secondary | ICD-10-CM | POA: Diagnosis not present

## 2017-06-19 DIAGNOSIS — J301 Allergic rhinitis due to pollen: Secondary | ICD-10-CM | POA: Diagnosis not present

## 2017-06-19 DIAGNOSIS — J3089 Other allergic rhinitis: Secondary | ICD-10-CM | POA: Diagnosis not present

## 2017-06-21 DIAGNOSIS — J3089 Other allergic rhinitis: Secondary | ICD-10-CM | POA: Diagnosis not present

## 2017-06-21 DIAGNOSIS — J301 Allergic rhinitis due to pollen: Secondary | ICD-10-CM | POA: Diagnosis not present

## 2017-06-27 DIAGNOSIS — J3089 Other allergic rhinitis: Secondary | ICD-10-CM | POA: Diagnosis not present

## 2017-06-27 DIAGNOSIS — J301 Allergic rhinitis due to pollen: Secondary | ICD-10-CM | POA: Diagnosis not present

## 2017-07-13 DIAGNOSIS — J3089 Other allergic rhinitis: Secondary | ICD-10-CM | POA: Diagnosis not present

## 2017-07-13 DIAGNOSIS — J301 Allergic rhinitis due to pollen: Secondary | ICD-10-CM | POA: Diagnosis not present

## 2017-07-21 DIAGNOSIS — J301 Allergic rhinitis due to pollen: Secondary | ICD-10-CM | POA: Diagnosis not present

## 2017-07-21 DIAGNOSIS — J3089 Other allergic rhinitis: Secondary | ICD-10-CM | POA: Diagnosis not present

## 2017-07-24 DIAGNOSIS — J301 Allergic rhinitis due to pollen: Secondary | ICD-10-CM | POA: Diagnosis not present

## 2017-07-24 DIAGNOSIS — J3089 Other allergic rhinitis: Secondary | ICD-10-CM | POA: Diagnosis not present

## 2017-07-28 DIAGNOSIS — J3089 Other allergic rhinitis: Secondary | ICD-10-CM | POA: Diagnosis not present

## 2017-08-02 DIAGNOSIS — J301 Allergic rhinitis due to pollen: Secondary | ICD-10-CM | POA: Diagnosis not present

## 2017-08-02 DIAGNOSIS — J3089 Other allergic rhinitis: Secondary | ICD-10-CM | POA: Diagnosis not present

## 2017-08-04 DIAGNOSIS — J3089 Other allergic rhinitis: Secondary | ICD-10-CM | POA: Diagnosis not present

## 2017-08-11 DIAGNOSIS — J3089 Other allergic rhinitis: Secondary | ICD-10-CM | POA: Diagnosis not present

## 2017-08-11 DIAGNOSIS — J301 Allergic rhinitis due to pollen: Secondary | ICD-10-CM | POA: Diagnosis not present

## 2017-08-18 DIAGNOSIS — J301 Allergic rhinitis due to pollen: Secondary | ICD-10-CM | POA: Diagnosis not present

## 2017-08-18 DIAGNOSIS — J3089 Other allergic rhinitis: Secondary | ICD-10-CM | POA: Diagnosis not present

## 2017-08-25 DIAGNOSIS — J301 Allergic rhinitis due to pollen: Secondary | ICD-10-CM | POA: Diagnosis not present

## 2017-08-25 DIAGNOSIS — J3089 Other allergic rhinitis: Secondary | ICD-10-CM | POA: Diagnosis not present

## 2017-09-01 DIAGNOSIS — J3089 Other allergic rhinitis: Secondary | ICD-10-CM | POA: Diagnosis not present

## 2017-09-01 DIAGNOSIS — J301 Allergic rhinitis due to pollen: Secondary | ICD-10-CM | POA: Diagnosis not present

## 2017-09-08 DIAGNOSIS — J301 Allergic rhinitis due to pollen: Secondary | ICD-10-CM | POA: Diagnosis not present

## 2017-09-08 DIAGNOSIS — J3089 Other allergic rhinitis: Secondary | ICD-10-CM | POA: Diagnosis not present

## 2017-09-12 ENCOUNTER — Other Ambulatory Visit: Payer: Self-pay | Admitting: Pulmonary Disease

## 2017-09-13 ENCOUNTER — Other Ambulatory Visit: Payer: Self-pay | Admitting: Pulmonary Disease

## 2017-09-15 DIAGNOSIS — J301 Allergic rhinitis due to pollen: Secondary | ICD-10-CM | POA: Diagnosis not present

## 2017-09-15 DIAGNOSIS — J3089 Other allergic rhinitis: Secondary | ICD-10-CM | POA: Diagnosis not present

## 2017-09-20 ENCOUNTER — Other Ambulatory Visit: Payer: Self-pay | Admitting: Pulmonary Disease

## 2017-09-21 ENCOUNTER — Telehealth: Payer: Self-pay | Admitting: Pulmonary Disease

## 2017-09-21 ENCOUNTER — Other Ambulatory Visit: Payer: Self-pay | Admitting: Family Medicine

## 2017-09-21 DIAGNOSIS — Z1231 Encounter for screening mammogram for malignant neoplasm of breast: Secondary | ICD-10-CM

## 2017-09-21 MED ORDER — MONTELUKAST SODIUM 10 MG PO TABS: 10.0000 mg | ORAL_TABLET | Freq: Every day | ORAL | 2 refills | Status: DC

## 2017-09-21 NOTE — Telephone Encounter (Signed)
Spoke with pt and advised rx sent to pharmacy. Nothing further is needed.   

## 2017-09-22 DIAGNOSIS — J301 Allergic rhinitis due to pollen: Secondary | ICD-10-CM | POA: Diagnosis not present

## 2017-09-22 DIAGNOSIS — J3089 Other allergic rhinitis: Secondary | ICD-10-CM | POA: Diagnosis not present

## 2017-09-23 DIAGNOSIS — Z23 Encounter for immunization: Secondary | ICD-10-CM | POA: Diagnosis not present

## 2017-09-29 DIAGNOSIS — J301 Allergic rhinitis due to pollen: Secondary | ICD-10-CM | POA: Diagnosis not present

## 2017-09-29 DIAGNOSIS — J3089 Other allergic rhinitis: Secondary | ICD-10-CM | POA: Diagnosis not present

## 2017-10-06 DIAGNOSIS — J301 Allergic rhinitis due to pollen: Secondary | ICD-10-CM | POA: Diagnosis not present

## 2017-10-06 DIAGNOSIS — J3089 Other allergic rhinitis: Secondary | ICD-10-CM | POA: Diagnosis not present

## 2017-10-10 DIAGNOSIS — J301 Allergic rhinitis due to pollen: Secondary | ICD-10-CM | POA: Diagnosis not present

## 2017-10-10 DIAGNOSIS — J3089 Other allergic rhinitis: Secondary | ICD-10-CM | POA: Diagnosis not present

## 2017-10-26 ENCOUNTER — Ambulatory Visit
Admission: RE | Admit: 2017-10-26 | Discharge: 2017-10-26 | Disposition: A | Discharge status: Home / Self Care | Payer: Medicare Other | Source: Ambulatory Visit | Attending: Family Medicine | Admitting: Family Medicine

## 2017-10-26 DIAGNOSIS — Z1231 Encounter for screening mammogram for malignant neoplasm of breast: Secondary | ICD-10-CM

## 2017-10-27 ENCOUNTER — Other Ambulatory Visit: Payer: Self-pay | Admitting: Pulmonary Disease

## 2017-10-27 DIAGNOSIS — J4530 Mild persistent asthma, uncomplicated: Secondary | ICD-10-CM | POA: Diagnosis not present

## 2017-10-27 DIAGNOSIS — J301 Allergic rhinitis due to pollen: Secondary | ICD-10-CM | POA: Diagnosis not present

## 2017-10-27 DIAGNOSIS — K219 Gastro-esophageal reflux disease without esophagitis: Secondary | ICD-10-CM | POA: Diagnosis not present

## 2017-10-27 DIAGNOSIS — J3089 Other allergic rhinitis: Secondary | ICD-10-CM | POA: Diagnosis not present

## 2017-10-30 DIAGNOSIS — J3089 Other allergic rhinitis: Secondary | ICD-10-CM | POA: Diagnosis not present

## 2017-10-30 DIAGNOSIS — J301 Allergic rhinitis due to pollen: Secondary | ICD-10-CM | POA: Diagnosis not present

## 2017-11-02 DIAGNOSIS — J301 Allergic rhinitis due to pollen: Secondary | ICD-10-CM | POA: Diagnosis not present

## 2017-11-02 DIAGNOSIS — J3089 Other allergic rhinitis: Secondary | ICD-10-CM | POA: Diagnosis not present

## 2017-11-09 DIAGNOSIS — J3089 Other allergic rhinitis: Secondary | ICD-10-CM | POA: Diagnosis not present

## 2017-11-09 DIAGNOSIS — J301 Allergic rhinitis due to pollen: Secondary | ICD-10-CM | POA: Diagnosis not present

## 2017-11-16 ENCOUNTER — Encounter: Payer: Self-pay | Admitting: Pulmonary Disease

## 2017-11-16 ENCOUNTER — Ambulatory Visit (INDEPENDENT_AMBULATORY_CARE_PROVIDER_SITE_OTHER): Payer: Medicare Other | Admitting: Pulmonary Disease

## 2017-11-16 DIAGNOSIS — K219 Gastro-esophageal reflux disease without esophagitis: Secondary | ICD-10-CM

## 2017-11-16 DIAGNOSIS — J4540 Moderate persistent asthma, uncomplicated: Secondary | ICD-10-CM

## 2017-11-16 NOTE — Assessment & Plan Note (Signed)
Take a break from omeprazole and use it as needed for 3 months as a trial  Also discussed nonpharmacological measures and she is good about this.

## 2017-11-16 NOTE — Patient Instructions (Signed)
Continue on Symbicort 80 and Singulair  Take a break from omeprazole and use it as needed for 3 months as a trial

## 2017-11-16 NOTE — Progress Notes (Signed)
   Subjective:    Patient ID: [name], female    DOB: 1950/01/16, 67 y.o.   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 She was on Advair for more than 12 years, on her last visit we changed to Symbicort 80 and this has worked really well for her and she had only one flareup in the last year, hoarseness has disappeared.   She remains on allergy shots( sharma) and underwent repeat allergy testing which showed that this is decreased to weed.  She remains on omeprazole daily and wonders about long-term side effects. She is also on Singulair   Significant tests/ events Spirometry 2010 normal CT sinuses 09/2013 normal  Review of Systems Patient denies significant dyspnea,cough, hemoptysis,  chest pain, palpitations, pedal edema, orthopnea, paroxysmal nocturnal dyspnea, lightheadedness, nausea, vomiting, abdominal or  leg pains      Objective:   Physical Exam   Gen. Pleasant, well-nourished, in no distress ENT - no thrush, no post nasal drip Neck: No JVD, no thyromegaly, no carotid bruits Lungs: no use of accessory muscles, no dullness to percussion, clear without rales or rhonchi  Cardiovascular: Rhythm regular, heart sounds  normal, no murmurs or gallops, no peripheral edema Musculoskeletal: No deformities, no cyanosis or clubbing         Assessment & Plan:

## 2017-11-16 NOTE — Assessment & Plan Note (Signed)
Continue on Symbicort 80 and Singulair

## 2017-11-24 DIAGNOSIS — J301 Allergic rhinitis due to pollen: Secondary | ICD-10-CM | POA: Diagnosis not present

## 2017-11-24 DIAGNOSIS — J3089 Other allergic rhinitis: Secondary | ICD-10-CM | POA: Diagnosis not present

## 2017-12-04 DIAGNOSIS — J3089 Other allergic rhinitis: Secondary | ICD-10-CM | POA: Diagnosis not present

## 2017-12-04 DIAGNOSIS — J301 Allergic rhinitis due to pollen: Secondary | ICD-10-CM | POA: Diagnosis not present

## 2017-12-11 DIAGNOSIS — J301 Allergic rhinitis due to pollen: Secondary | ICD-10-CM | POA: Diagnosis not present

## 2017-12-11 DIAGNOSIS — J3089 Other allergic rhinitis: Secondary | ICD-10-CM | POA: Diagnosis not present

## 2017-12-22 DIAGNOSIS — J301 Allergic rhinitis due to pollen: Secondary | ICD-10-CM | POA: Diagnosis not present

## 2017-12-22 DIAGNOSIS — J3089 Other allergic rhinitis: Secondary | ICD-10-CM | POA: Diagnosis not present

## 2017-12-30 ENCOUNTER — Other Ambulatory Visit: Payer: Self-pay | Admitting: Pulmonary Disease

## 2018-01-04 DIAGNOSIS — J301 Allergic rhinitis due to pollen: Secondary | ICD-10-CM | POA: Diagnosis not present

## 2018-01-04 DIAGNOSIS — J3089 Other allergic rhinitis: Secondary | ICD-10-CM | POA: Diagnosis not present

## 2018-01-15 ENCOUNTER — Other Ambulatory Visit: Payer: Self-pay | Admitting: Pulmonary Disease

## 2018-01-25 ENCOUNTER — Other Ambulatory Visit: Payer: Self-pay | Admitting: Pulmonary Disease

## 2018-01-25 DIAGNOSIS — J301 Allergic rhinitis due to pollen: Secondary | ICD-10-CM | POA: Diagnosis not present

## 2018-01-25 DIAGNOSIS — J3089 Other allergic rhinitis: Secondary | ICD-10-CM | POA: Diagnosis not present

## 2018-01-29 DIAGNOSIS — M545 Low back pain: Secondary | ICD-10-CM | POA: Diagnosis not present

## 2018-02-01 DIAGNOSIS — J301 Allergic rhinitis due to pollen: Secondary | ICD-10-CM | POA: Diagnosis not present

## 2018-02-01 DIAGNOSIS — J3089 Other allergic rhinitis: Secondary | ICD-10-CM | POA: Diagnosis not present

## 2018-02-12 ENCOUNTER — Other Ambulatory Visit: Payer: Self-pay | Admitting: Pulmonary Disease

## 2018-02-12 DIAGNOSIS — M5417 Radiculopathy, lumbosacral region: Secondary | ICD-10-CM | POA: Diagnosis not present

## 2018-02-12 DIAGNOSIS — M9904 Segmental and somatic dysfunction of sacral region: Secondary | ICD-10-CM | POA: Diagnosis not present

## 2018-02-12 DIAGNOSIS — M9903 Segmental and somatic dysfunction of lumbar region: Secondary | ICD-10-CM | POA: Diagnosis not present

## 2018-02-12 DIAGNOSIS — M545 Low back pain: Secondary | ICD-10-CM | POA: Diagnosis not present

## 2018-02-16 DIAGNOSIS — J3089 Other allergic rhinitis: Secondary | ICD-10-CM | POA: Diagnosis not present

## 2018-02-16 DIAGNOSIS — J301 Allergic rhinitis due to pollen: Secondary | ICD-10-CM | POA: Diagnosis not present

## 2018-02-19 DIAGNOSIS — M9903 Segmental and somatic dysfunction of lumbar region: Secondary | ICD-10-CM | POA: Diagnosis not present

## 2018-02-19 DIAGNOSIS — M9904 Segmental and somatic dysfunction of sacral region: Secondary | ICD-10-CM | POA: Diagnosis not present

## 2018-02-19 DIAGNOSIS — M5417 Radiculopathy, lumbosacral region: Secondary | ICD-10-CM | POA: Diagnosis not present

## 2018-02-19 DIAGNOSIS — M545 Low back pain: Secondary | ICD-10-CM | POA: Diagnosis not present

## 2018-02-21 ENCOUNTER — Telehealth: Payer: Self-pay | Admitting: Pulmonary Disease

## 2018-02-21 MED ORDER — PREDNISONE 10 MG PO TABS: ORAL_TABLET | ORAL | 0 refills | Status: DC

## 2018-02-21 MED ORDER — AZITHROMYCIN 250 MG PO TABS: 250.0000 mg | ORAL_TABLET | ORAL | 0 refills | Status: DC

## 2018-02-21 NOTE — Telephone Encounter (Signed)
Patient stated she is having postnasal drip and chest congestion and coughing all night for 2 days now she would like Prednisone and ABX sent to [facility].  Dr. Elsworth Soho please advise

## 2018-02-21 NOTE — Telephone Encounter (Signed)
Spoke with the pt and notified of recs per RA  She verbalized understanding  Rxs sent to [facility]

## 2018-02-21 NOTE — Telephone Encounter (Signed)
Z-Pak Prednisone 10 mg tabs  Take 2 tabs daily with food x 5ds, then 1 tab daily with food x 5ds then STOP

## 2018-02-26 ENCOUNTER — Ambulatory Visit (INDEPENDENT_AMBULATORY_CARE_PROVIDER_SITE_OTHER)
Admission: RE | Admit: 2018-02-26 | Discharge: 2018-02-26 | Disposition: A | Discharge status: Home / Self Care | Payer: Medicare Other | Source: Ambulatory Visit | Attending: Adult Health | Admitting: Adult Health

## 2018-02-26 ENCOUNTER — Ambulatory Visit (INDEPENDENT_AMBULATORY_CARE_PROVIDER_SITE_OTHER): Payer: Medicare Other | Admitting: Adult Health

## 2018-02-26 ENCOUNTER — Encounter: Payer: Self-pay | Admitting: Adult Health

## 2018-02-26 ENCOUNTER — Telehealth: Payer: Self-pay | Admitting: Adult Health

## 2018-02-26 VITALS — BP 116/66 | HR 76 | Temp 98.2°F | Ht 62.0 in | Wt 148.0 lb

## 2018-02-26 DIAGNOSIS — J4540 Moderate persistent asthma, uncomplicated: Secondary | ICD-10-CM | POA: Diagnosis not present

## 2018-02-26 DIAGNOSIS — R05 Cough: Secondary | ICD-10-CM | POA: Diagnosis not present

## 2018-02-26 MED ORDER — ALBUTEROL SULFATE (2.5 MG/3ML) 0.083% IN NEBU: 2.5000 mg | INHALATION_SOLUTION | Freq: Four times a day (QID) | RESPIRATORY_TRACT | 6 refills | Status: DC | PRN

## 2018-02-26 MED ORDER — LEVALBUTEROL HCL 0.63 MG/3ML IN NEBU
0.6300 mg | INHALATION_SOLUTION | Freq: Once | RESPIRATORY_TRACT | Status: AC
  Administered 2018-02-26: 0.63 mg via RESPIRATORY_TRACT

## 2018-02-26 MED ORDER — BUDESONIDE-FORMOTEROL FUMARATE 160-4.5 MCG/ACT IN AERO: 2.0000 | INHALATION_SPRAY | Freq: Two times a day (BID) | RESPIRATORY_TRACT | 0 refills | Status: DC

## 2018-02-26 MED ORDER — ALBUTEROL SULFATE (2.5 MG/3ML) 0.083% IN NEBU: 2.5000 mg | INHALATION_SOLUTION | Freq: Four times a day (QID) | RESPIRATORY_TRACT | 5 refills | Status: AC | PRN

## 2018-02-26 MED ORDER — BENZONATATE 200 MG PO CAPS: 200.0000 mg | ORAL_CAPSULE | Freq: Three times a day (TID) | ORAL | 1 refills | Status: DC | PRN

## 2018-02-26 NOTE — Progress Notes (Signed)
@Patient  ID: [name], female    DOB: [birth year], 68 y.o.   MRN: [id number]  Chief Complaint  Patient presents with  . 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  Patient called in last week with acute symptoms of cough congestion and general malaise.  She was called in a Z-Pak and prednisone taper.  She says she has finished her Z-Pak.  She has a few days left on a prednisone.  She is starting to feel better but she continues to have very low energy.  She has some lingering cough.  She has no discolored mucus or fever. Grandchildren have been sick with URI with fever.  She denies any recent travel or antibiotic use. Appetite has been fair.  She denies any nausea vomiting or diarrhea.  Chest x-ray today was reviewed in detail.  It shows clear lungs except for his chronic scarring in the left base.    Allergies  Allergen Reactions  . Latex     Immunization History  Administered Date(s) Administered  . Influenza Split 11/04/2010, 01/04/2012  . Influenza, High Dose Seasonal PF 11/01/2017  . Influenza,inj,Quad PF,6+ Mos 08/31/2012, 10/29/2014, 10/21/2015  . Pneumococcal Conjugate-13 04/04/2015  . Pneumococcal Polysaccharide-23 04/03/2017  . Tdap 02/03/2011    Past Medical History:  Diagnosis Date  . Allergic rhinitis   . Arthritis   . Asthma   . GERD (gastroesophageal reflux disease)   . Hyperlipemia   . Polio   . Unspecified essential hypertension    clearance Dr Schuyler Amor with note on chart    Tobacco History: Social History   Tobacco Use  Smoking Status Never Smoker  Smokeless Tobacco Never Used   Counseling given: Not Answered   Outpatient Medications Prior to  Visit  Medication Sig Dispense Refill  . alendronate (FOSAMAX) 70 MG tablet Take 70 mg by mouth once a week.    . cholecalciferol (VITAMIN D) 1000 UNITS tablet Take 2,000 Units by mouth every morning.     . Cyanocobalamin (VITAMIN B-12 CR) 1500 MCG TBCR Take 1 tablet by mouth daily. 1000mg  daily    . fluticasone (FLONASE) 50 MCG/ACT nasal spray Place 2 sprays 2 (two) times daily into both nostrils. 16 g 2  . loratadine (CLARITIN) 10 MG tablet Take 10 mg by mouth daily.    Marland Kitchen losartan-hydrochlorothiazide (HYZAAR) 100-25 MG per tablet Take 1 tablet by mouth daily.    . montelukast (SINGULAIR) 10 MG tablet TAKE ONE TABLET AT BEDTIME. 30 tablet 5  . omeprazole (PRILOSEC) 20 MG capsule TAKE 1 CAPSULE EVERY DAY. 30 capsule 0  . predniSONE (DELTASONE) 10 MG tablet 2 x 5 days, 1 x 5 days then stop 15 tablet 0  . SYMBICORT 80-4.5 MCG/ACT inhaler USE 2 PUFFS TWICE A DAY. 10.2 g 11  . albuterol (PROVENTIL) (2.5 MG/3ML) 0.083% nebulizer solution Take 3 mLs (2.5 mg total) every 6 (six) hours as needed by nebulization for wheezing or shortness of breath. 30 mL 0  . azithromycin (ZITHROMAX) 250 MG tablet Take 1 tablet (250 mg total) by mouth as directed. (Patient not taking: Reported on 02/26/2018) 6 tablet 0   No facility-administered medications prior to visit.  Review of Systems:   Constitutional:   No  weight loss, night sweats,  Fevers, chills, fatigue, or  lassitude.  HEENT:   No headaches,  Difficulty swallowing,  Tooth/dental problems, or  Sore throat,                No sneezing, itching, ear ache,  +nasal congestion, post nasal drip,   CV:  No chest pain,  Orthopnea, PND, swelling in lower extremities, anasarca, dizziness, palpitations, syncope.   GI  No heartburn, indigestion, abdominal pain, nausea, vomiting, diarrhea, change in bowel habits, loss of appetite, bloody stools.   Resp:    No chest wall deformity  Skin: no rash or lesions.  GU: no dysuria, change in color of urine, no  urgency or frequency.  No flank pain, no hematuria   MS:  No joint pain or swelling.  No decreased range of motion.  No back pain.    Physical Exam  BP 116/66 (BP Location: Left Arm, Cuff Size: Normal)   Pulse 76   Temp 98.2 F (36.8 C) (Oral)   Ht 5\' 2"  (1.575 m)   Wt 148 lb (67.1 kg)   SpO2 97%   BMI 27.07 kg/m   GEN: A/Ox3; pleasant , NAD, elderly    HEENT:  [place]/AT,  EACs-clear, TMs-wnl, NOSE-clear, THROAT-clear, no lesions, no postnasal drip or exudate noted.   NECK:  Supple w/ fair ROM; no JVD; normal carotid impulses w/o bruits; no thyromegaly or nodules palpated; no lymphadenopathy.    RESP  Few trace rhonchi   no accessory muscle use, no dullness to percussion  CARD:  RRR, no m/r/g, no peripheral edema, pulses intact, no cyanosis or clubbing.  GI:   Soft & nt; nml bowel sounds; no organomegaly or masses detected.   Musco: Warm bil, no deformities or joint swelling noted.   Neuro: alert, no focal deficits noted.    Skin: Warm, no lesions or rashes    Lab Results:  CBC    Component Value Date/Time   WBC 11.6 (H) 02/24/2013 1650   RBC 4.14 02/24/2013 1650   HGB 13.0 02/24/2013 1650   HCT 38.4 02/24/2013 1650   PLT 375 02/24/2013 1650   MCV 92.8 02/24/2013 1650   MCV 99.9 (A) 02/24/2013 1429   MCH 31.4 02/24/2013 1650   MCHC 33.9 02/24/2013 1650   RDW 12.9 02/24/2013 1650   LYMPHSABS 1.9 02/24/2013 1650   MONOABS 1.4 (H) 02/24/2013 1650   EOSABS 0.1 02/24/2013 1650   BASOSABS 0.0 02/24/2013 1650    BMET    Component Value Date/Time   NA 137 02/24/2013 1650   K 4.9 02/24/2013 1650   CL 93 (L) 02/24/2013 1650   CO2 29 02/24/2013 1650   GLUCOSE 126 (H) 02/24/2013 1650   BUN 7 02/24/2013 1650   CREATININE 0.69 02/24/2013 1650   CALCIUM 9.6 02/24/2013 1650   GFRNONAA >90 02/24/2013 1650   GFRAA >90 02/24/2013 1650    BNP No results found for: BNP  ProBNP No results found for: PROBNP  Imaging: Dg Chest 2 View  Result Date:  02/26/2018 CLINICAL DATA:  Cough and congestion for 9 days. EXAM: CHEST - 2 VIEW COMPARISON:  The two-view chest x-ray 02/24/2013. FINDINGS: The heart size is normal. Aortic atherosclerosis is present. Lungs are mildly hyperinflated. Chronic scarring at the left base is stable. Lungs are otherwise clear. There is no edema or effusion. The visualized soft tissues and bony thorax are unremarkable. IMPRESSION: 1. No acute cardiopulmonary disease. 2.  Stable scarring at the left base. 3. Aortic atherosclerosis. Electronically Signed   By: San Morelle M.D.   On: 02/26/2018 15:39    levalbuterol (XOPENEX) nebulizer solution 0.63 mg    Date Action Dose Route User   02/26/2018 1534 Given 0.63 mg Nebulization Parke Poisson E, CMA      No flowsheet data found.  No results found for: NITRICOXIDE      Assessment & Plan:   Moderate persistent asthma without complication Recent exacerbation with upper respiratory infection.  Patient is clinically improving.  Xopenex nebulizer was given in the office.  Chest x-ray shows no evidence of pneumonia.  Plan  Patient Instructions  Finish prednisone as directed.  Mucinex DM Twice daily As needed   May use Albuterol neb every 4hr as needed.  May use Symbicort 160 sample-- puffs Twice daily  Until gone then resume Symbicort 80 2 puffs Twice daily   Tessalon Three times a day  As needed for severe cough  Follow up as planned with Dr. Elsworth Soho  And As needed   Please contact office for sooner follow up if symptoms do not improve or worsen or seek emergency care           Rexene Edison, NP 02/26/2018

## 2018-02-26 NOTE — Telephone Encounter (Signed)
Spoke with patient. She was seen by TP today. She needed a refill on her albuterol neb solution. Advised patient that I would call this into [facility]. This has been sent in for her.   Nothing further needed at time of call.

## 2018-02-26 NOTE — Patient Instructions (Addendum)
Finish prednisone as directed.  Mucinex DM Twice daily As needed   May use Albuterol neb every 4hr as needed.  May use Symbicort 160 sample-- puffs Twice daily  Until gone then resume Symbicort 80 2 puffs Twice daily   Tessalon Three times a day  As needed for severe cough  Follow up as planned with Dr. Elsworth Soho  And As needed   Please contact office for sooner follow up if symptoms do not improve or worsen or seek emergency care

## 2018-02-26 NOTE — Assessment & Plan Note (Signed)
Recent exacerbation with upper respiratory infection.  Patient is clinically improving.  Xopenex nebulizer was given in the office.  Chest x-ray shows no evidence of pneumonia.  Plan  Patient Instructions  Finish prednisone as directed.  Mucinex DM Twice daily As needed   May use Albuterol neb every 4hr as needed.  May use Symbicort 160 sample-- puffs Twice daily  Until gone then resume Symbicort 80 2 puffs Twice daily   Tessalon Three times a day  As needed for severe cough  Follow up as planned with Dr. Elsworth Soho  And As needed   Please contact office for sooner follow up if symptoms do not improve or worsen or seek emergency care

## 2018-03-02 ENCOUNTER — Telehealth: Payer: Self-pay | Admitting: Pulmonary Disease

## 2018-03-02 MED ORDER — PREDNISONE 10 MG PO TABS: ORAL_TABLET | ORAL | 0 refills | Status: DC

## 2018-03-02 NOTE — Telephone Encounter (Signed)
Called and spoke with Patient. TP recommendations given. Understanding stated.  Prednisone taper sent to requested pharmacy. OV scheduled with TP, 03/12/18, at 4pm.  Nothing further at this time.    Per TP-  03/02/18 4:55 PM  Note    That is fine  Continue on Mucinex DM Twice daily  As needed  Cough/congestion  Tessalon Three times a day  As needed  Cough .   Prednisone 10mg  4 tabs for 2 days, then 3 tabs for 2 days, 2 tabs for 2 days, then 1 tab for 2 days, then stop  #20 , no refills.   Please set ov in 2 weeks for check up  If not improving will need ov sooner   Please contact office for sooner follow up if symptoms do not improve or worsen or seek emergency care

## 2018-03-02 NOTE — Telephone Encounter (Signed)
Spoke with pt.  She stated she is feeling better than Monday but still has chest congestion.  She is occasionally coughing with clear mucus.  She has tessalon for cough and is barely taking them.  She is taking mucinex twice a day.  Appointment was offered but she declined appt.  Pt stated she usually takes 2 rounds of prednisone before feeling better.  Tammy please advise.

## 2018-03-02 NOTE — Telephone Encounter (Signed)
That is fine  Continue on Mucinex DM Twice daily  As needed  Cough/congestion  Tessalon Three times a day  As needed  Cough .   Prednisone 10mg  4 tabs for 2 days, then 3 tabs for 2 days, 2 tabs for 2 days, then 1 tab for 2 days, then stop  #20 , no refills.   Please set ov in 2 weeks for check up  If not improving will need ov sooner   Please contact office for sooner follow up if symptoms do not improve or worsen or seek emergency care

## 2018-03-05 DIAGNOSIS — M545 Low back pain: Secondary | ICD-10-CM | POA: Diagnosis not present

## 2018-03-05 DIAGNOSIS — M9904 Segmental and somatic dysfunction of sacral region: Secondary | ICD-10-CM | POA: Diagnosis not present

## 2018-03-05 DIAGNOSIS — M9903 Segmental and somatic dysfunction of lumbar region: Secondary | ICD-10-CM | POA: Diagnosis not present

## 2018-03-05 DIAGNOSIS — M5417 Radiculopathy, lumbosacral region: Secondary | ICD-10-CM | POA: Diagnosis not present

## 2018-03-12 ENCOUNTER — Ambulatory Visit: Payer: Medicare Other | Admitting: Adult Health

## 2018-03-14 DIAGNOSIS — J301 Allergic rhinitis due to pollen: Secondary | ICD-10-CM | POA: Diagnosis not present

## 2018-03-14 DIAGNOSIS — J3089 Other allergic rhinitis: Secondary | ICD-10-CM | POA: Diagnosis not present

## 2018-03-21 DIAGNOSIS — J301 Allergic rhinitis due to pollen: Secondary | ICD-10-CM | POA: Diagnosis not present

## 2018-03-21 DIAGNOSIS — J3089 Other allergic rhinitis: Secondary | ICD-10-CM | POA: Diagnosis not present

## 2018-03-23 ENCOUNTER — Other Ambulatory Visit: Payer: Self-pay | Admitting: Pulmonary Disease

## 2018-03-30 DIAGNOSIS — J3089 Other allergic rhinitis: Secondary | ICD-10-CM | POA: Diagnosis not present

## 2018-03-30 DIAGNOSIS — J301 Allergic rhinitis due to pollen: Secondary | ICD-10-CM | POA: Diagnosis not present

## 2018-04-04 DIAGNOSIS — J301 Allergic rhinitis due to pollen: Secondary | ICD-10-CM | POA: Diagnosis not present

## 2018-04-04 DIAGNOSIS — J3089 Other allergic rhinitis: Secondary | ICD-10-CM | POA: Diagnosis not present

## 2018-04-06 DIAGNOSIS — J301 Allergic rhinitis due to pollen: Secondary | ICD-10-CM | POA: Diagnosis not present

## 2018-04-06 DIAGNOSIS — J3089 Other allergic rhinitis: Secondary | ICD-10-CM | POA: Diagnosis not present

## 2018-04-18 DIAGNOSIS — J301 Allergic rhinitis due to pollen: Secondary | ICD-10-CM | POA: Diagnosis not present

## 2018-04-18 DIAGNOSIS — J3089 Other allergic rhinitis: Secondary | ICD-10-CM | POA: Diagnosis not present

## 2018-04-20 ENCOUNTER — Other Ambulatory Visit: Payer: Self-pay | Admitting: Pulmonary Disease

## 2018-04-20 DIAGNOSIS — J3089 Other allergic rhinitis: Secondary | ICD-10-CM | POA: Diagnosis not present

## 2018-04-27 DIAGNOSIS — J301 Allergic rhinitis due to pollen: Secondary | ICD-10-CM | POA: Diagnosis not present

## 2018-04-27 DIAGNOSIS — J3089 Other allergic rhinitis: Secondary | ICD-10-CM | POA: Diagnosis not present

## 2018-05-04 DIAGNOSIS — J3089 Other allergic rhinitis: Secondary | ICD-10-CM | POA: Diagnosis not present

## 2018-05-04 DIAGNOSIS — J301 Allergic rhinitis due to pollen: Secondary | ICD-10-CM | POA: Diagnosis not present

## 2018-05-15 DIAGNOSIS — J3089 Other allergic rhinitis: Secondary | ICD-10-CM | POA: Diagnosis not present

## 2018-05-15 DIAGNOSIS — J301 Allergic rhinitis due to pollen: Secondary | ICD-10-CM | POA: Diagnosis not present

## 2018-05-24 DIAGNOSIS — J3089 Other allergic rhinitis: Secondary | ICD-10-CM | POA: Diagnosis not present

## 2018-05-24 DIAGNOSIS — J301 Allergic rhinitis due to pollen: Secondary | ICD-10-CM | POA: Diagnosis not present

## 2018-06-04 DIAGNOSIS — J3089 Other allergic rhinitis: Secondary | ICD-10-CM | POA: Diagnosis not present

## 2018-06-04 DIAGNOSIS — J301 Allergic rhinitis due to pollen: Secondary | ICD-10-CM | POA: Diagnosis not present

## 2018-06-12 DIAGNOSIS — J301 Allergic rhinitis due to pollen: Secondary | ICD-10-CM | POA: Diagnosis not present

## 2018-06-12 DIAGNOSIS — J3089 Other allergic rhinitis: Secondary | ICD-10-CM | POA: Diagnosis not present

## 2018-06-14 DIAGNOSIS — E538 Deficiency of other specified B group vitamins: Secondary | ICD-10-CM | POA: Diagnosis not present

## 2018-06-14 DIAGNOSIS — I10 Essential (primary) hypertension: Secondary | ICD-10-CM | POA: Diagnosis not present

## 2018-06-14 DIAGNOSIS — E611 Iron deficiency: Secondary | ICD-10-CM | POA: Diagnosis not present

## 2018-06-14 DIAGNOSIS — E559 Vitamin D deficiency, unspecified: Secondary | ICD-10-CM | POA: Diagnosis not present

## 2018-06-14 DIAGNOSIS — Z77018 Contact with and (suspected) exposure to other hazardous metals: Secondary | ICD-10-CM | POA: Diagnosis not present

## 2018-06-14 DIAGNOSIS — E785 Hyperlipidemia, unspecified: Secondary | ICD-10-CM | POA: Diagnosis not present

## 2018-06-14 DIAGNOSIS — T56891A Toxic effect of other metals, accidental (unintentional), initial encounter: Secondary | ICD-10-CM | POA: Diagnosis not present

## 2018-06-18 DIAGNOSIS — Z0000 Encounter for general adult medical examination without abnormal findings: Secondary | ICD-10-CM | POA: Diagnosis not present

## 2018-06-18 DIAGNOSIS — Z1211 Encounter for screening for malignant neoplasm of colon: Secondary | ICD-10-CM | POA: Diagnosis not present

## 2018-06-18 DIAGNOSIS — M810 Age-related osteoporosis without current pathological fracture: Secondary | ICD-10-CM | POA: Diagnosis not present

## 2018-06-18 DIAGNOSIS — I10 Essential (primary) hypertension: Secondary | ICD-10-CM | POA: Diagnosis not present

## 2018-06-18 DIAGNOSIS — E785 Hyperlipidemia, unspecified: Secondary | ICD-10-CM | POA: Diagnosis not present

## 2018-06-18 DIAGNOSIS — J45998 Other asthma: Secondary | ICD-10-CM | POA: Diagnosis not present

## 2018-06-20 DIAGNOSIS — J3089 Other allergic rhinitis: Secondary | ICD-10-CM | POA: Diagnosis not present

## 2018-06-20 DIAGNOSIS — J301 Allergic rhinitis due to pollen: Secondary | ICD-10-CM | POA: Diagnosis not present

## 2018-06-27 DIAGNOSIS — J3089 Other allergic rhinitis: Secondary | ICD-10-CM | POA: Diagnosis not present

## 2018-06-27 DIAGNOSIS — J301 Allergic rhinitis due to pollen: Secondary | ICD-10-CM | POA: Diagnosis not present

## 2018-07-05 ENCOUNTER — Other Ambulatory Visit: Payer: Self-pay | Admitting: Pulmonary Disease

## 2018-07-13 DIAGNOSIS — J301 Allergic rhinitis due to pollen: Secondary | ICD-10-CM | POA: Diagnosis not present

## 2018-07-13 DIAGNOSIS — J3089 Other allergic rhinitis: Secondary | ICD-10-CM | POA: Diagnosis not present

## 2018-07-19 DIAGNOSIS — J301 Allergic rhinitis due to pollen: Secondary | ICD-10-CM | POA: Diagnosis not present

## 2018-07-19 DIAGNOSIS — J3089 Other allergic rhinitis: Secondary | ICD-10-CM | POA: Diagnosis not present

## 2018-07-26 ENCOUNTER — Encounter: Payer: Self-pay | Admitting: Gastroenterology

## 2018-07-27 DIAGNOSIS — J301 Allergic rhinitis due to pollen: Secondary | ICD-10-CM | POA: Diagnosis not present

## 2018-07-27 DIAGNOSIS — J3089 Other allergic rhinitis: Secondary | ICD-10-CM | POA: Diagnosis not present

## 2018-07-30 DIAGNOSIS — M79605 Pain in left leg: Secondary | ICD-10-CM | POA: Diagnosis not present

## 2018-07-30 DIAGNOSIS — Z96641 Presence of right artificial hip joint: Secondary | ICD-10-CM | POA: Diagnosis not present

## 2018-07-30 DIAGNOSIS — Z471 Aftercare following joint replacement surgery: Secondary | ICD-10-CM | POA: Diagnosis not present

## 2018-08-06 ENCOUNTER — Other Ambulatory Visit: Payer: Self-pay | Admitting: Pulmonary Disease

## 2018-08-10 DIAGNOSIS — J3089 Other allergic rhinitis: Secondary | ICD-10-CM | POA: Diagnosis not present

## 2018-08-10 DIAGNOSIS — J301 Allergic rhinitis due to pollen: Secondary | ICD-10-CM | POA: Diagnosis not present

## 2018-08-14 DIAGNOSIS — J301 Allergic rhinitis due to pollen: Secondary | ICD-10-CM | POA: Diagnosis not present

## 2018-08-14 DIAGNOSIS — J3089 Other allergic rhinitis: Secondary | ICD-10-CM | POA: Diagnosis not present

## 2018-08-22 DIAGNOSIS — J301 Allergic rhinitis due to pollen: Secondary | ICD-10-CM | POA: Diagnosis not present

## 2018-08-22 DIAGNOSIS — J3089 Other allergic rhinitis: Secondary | ICD-10-CM | POA: Diagnosis not present

## 2018-08-24 DIAGNOSIS — J301 Allergic rhinitis due to pollen: Secondary | ICD-10-CM | POA: Diagnosis not present

## 2018-08-27 ENCOUNTER — Other Ambulatory Visit: Payer: Self-pay | Admitting: Pulmonary Disease

## 2018-08-27 ENCOUNTER — Telehealth: Payer: Self-pay | Admitting: Pulmonary Disease

## 2018-08-27 NOTE — Telephone Encounter (Signed)
What dose of omeprazole is the patient currently on?  Is using omeprazole 20 mg also known as Prilosec OTC which she can purchase over-the-counter?  Is the patient having breakthrough reflux?  There are other options such as proton pump inhibitors that are in the same class that she could utilize or patient could try being on Pepcid which is a different class of medication but also can help with stomach acid.  If the patient is having persistent breakthrough reflux despite using omeprazole though that we may need to consider further work-up by either primary care or referral to gastroenterology.  Wyn Quaker, FNP

## 2018-08-27 NOTE — Telephone Encounter (Signed)
Returned call to patient. Patient states the omeprazole is working for her reflux.  She states she tried to stop the omeprazole as suggested by Dr. Elsworth Soho but her reflux symptoms were 'terrible - with burning in my throat and could not sleep for discomfort.'  When starting back on Omeprazole symptoms were controlled.  '  Patient states Dr. Elsworth Soho had asked her to report back to him if Omeprazole helped or if she was able to discontinue it.  Her call today was to just pass on the information to Dr. Elsworth Soho.  Patient has never discussed her reflux with her PCP due to 'it was handled by using the Omeprazole Dr. Elsworth Soho prescribed.  Patient states she is supposed to schedule a follow up with Dr. Elsworth Soho in November but her call today was just an FYI regarding the omeprazole.    FYI:: Will forward this to Dr. Elsworth Soho and B. Warner Mccreedy, NP since he had been tagged on this encounter.  Nothing further needed.

## 2018-08-27 NOTE — Telephone Encounter (Signed)
[place] noted.   Can always discuss with PCP as well. If symptoms worsen may need to consider GI referral.   Wyn Quaker FNP

## 2018-08-27 NOTE — Telephone Encounter (Signed)
Noted  

## 2018-08-27 NOTE — Telephone Encounter (Signed)
  Pt saw Dr. Elsworth Soho 11/2017 and they discussed long term use of Omeprazole. Pt didn't feel safe using long-term. Dr. Elsworth Soho suggested patient hold Omeprazole for 2 weeks to trial. Pt says she tried the alternative measure he discussed with her vinegar/water which made her sick. Pt went back on Omeprazole trying EOD which wasn't beneficial Pt had to go back to daily use of Omeprazole. Pt wants to know if there is any other options?  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 She was on Advair for more than 12 years, on her last visit we changed to Symbicort 80 and this has worked really well for her and she had only one flareup in the last year, hoarseness has disappeared.   She remains on allergy shots( sharma)and underwent repeat allergy testing which showed that this is decreased to weed.  She remains on omeprazole daily and wonders about long-term side effects. She is also on Singulair     Assessment & Plan:   Moderate persistent asthma without complication Recent exacerbation with upper respiratory infection.  Patient is clinically improving.  Xopenex nebulizer was given in the office.  Chest x-ray shows no evidence of pneumonia.  Plan  Patient Instructions  Finish prednisone as directed.  Mucinex DM Twice daily As needed   May use Albuterol neb every 4hr as needed.  May use Symbicort 160 sample-- puffs Twice daily  Until gone then resume Symbicort 80 2 puffs Twice daily   Tessalon Three times a day  As needed for severe cough  Follow up as planned with Dr. Elsworth Soho  And As needed   Please contact office for sooner follow up if symptoms do not improve or worsen or seek emergency care

## 2018-08-29 DIAGNOSIS — J301 Allergic rhinitis due to pollen: Secondary | ICD-10-CM | POA: Diagnosis not present

## 2018-08-29 DIAGNOSIS — J3089 Other allergic rhinitis: Secondary | ICD-10-CM | POA: Diagnosis not present

## 2018-09-04 ENCOUNTER — Other Ambulatory Visit: Payer: Self-pay | Admitting: Pulmonary Disease

## 2018-09-07 DIAGNOSIS — J3089 Other allergic rhinitis: Secondary | ICD-10-CM | POA: Diagnosis not present

## 2018-09-07 DIAGNOSIS — J301 Allergic rhinitis due to pollen: Secondary | ICD-10-CM | POA: Diagnosis not present

## 2018-09-14 DIAGNOSIS — J3089 Other allergic rhinitis: Secondary | ICD-10-CM | POA: Diagnosis not present

## 2018-09-14 DIAGNOSIS — J301 Allergic rhinitis due to pollen: Secondary | ICD-10-CM | POA: Diagnosis not present

## 2018-09-18 DIAGNOSIS — J3089 Other allergic rhinitis: Secondary | ICD-10-CM | POA: Diagnosis not present

## 2018-09-18 DIAGNOSIS — Z23 Encounter for immunization: Secondary | ICD-10-CM | POA: Diagnosis not present

## 2018-09-18 DIAGNOSIS — J301 Allergic rhinitis due to pollen: Secondary | ICD-10-CM | POA: Diagnosis not present

## 2018-09-19 ENCOUNTER — Other Ambulatory Visit: Payer: Self-pay | Admitting: Family Medicine

## 2018-09-19 DIAGNOSIS — Z1231 Encounter for screening mammogram for malignant neoplasm of breast: Secondary | ICD-10-CM

## 2018-09-21 DIAGNOSIS — J3089 Other allergic rhinitis: Secondary | ICD-10-CM | POA: Diagnosis not present

## 2018-09-26 DIAGNOSIS — J301 Allergic rhinitis due to pollen: Secondary | ICD-10-CM | POA: Diagnosis not present

## 2018-09-26 DIAGNOSIS — J3089 Other allergic rhinitis: Secondary | ICD-10-CM | POA: Diagnosis not present

## 2018-09-30 ENCOUNTER — Other Ambulatory Visit: Payer: Self-pay | Admitting: Pulmonary Disease

## 2018-10-04 DIAGNOSIS — J301 Allergic rhinitis due to pollen: Secondary | ICD-10-CM | POA: Diagnosis not present

## 2018-10-04 DIAGNOSIS — J3089 Other allergic rhinitis: Secondary | ICD-10-CM | POA: Diagnosis not present

## 2018-10-11 DIAGNOSIS — J3089 Other allergic rhinitis: Secondary | ICD-10-CM | POA: Diagnosis not present

## 2018-10-11 DIAGNOSIS — J301 Allergic rhinitis due to pollen: Secondary | ICD-10-CM | POA: Diagnosis not present

## 2018-10-16 DIAGNOSIS — J3089 Other allergic rhinitis: Secondary | ICD-10-CM | POA: Diagnosis not present

## 2018-10-16 DIAGNOSIS — J301 Allergic rhinitis due to pollen: Secondary | ICD-10-CM | POA: Diagnosis not present

## 2018-10-25 ENCOUNTER — Other Ambulatory Visit: Payer: Self-pay | Admitting: Pulmonary Disease

## 2018-10-26 DIAGNOSIS — J301 Allergic rhinitis due to pollen: Secondary | ICD-10-CM | POA: Diagnosis not present

## 2018-10-26 DIAGNOSIS — J3089 Other allergic rhinitis: Secondary | ICD-10-CM | POA: Diagnosis not present

## 2018-10-30 ENCOUNTER — Other Ambulatory Visit: Payer: Self-pay

## 2018-10-30 ENCOUNTER — Ambulatory Visit
Admission: RE | Admit: 2018-10-30 | Discharge: 2018-10-30 | Disposition: A | Discharge status: Home / Self Care | Payer: Medicare Other | Source: Ambulatory Visit | Attending: Family Medicine | Admitting: Family Medicine

## 2018-10-30 DIAGNOSIS — Z1231 Encounter for screening mammogram for malignant neoplasm of breast: Secondary | ICD-10-CM

## 2018-10-31 ENCOUNTER — Telehealth: Payer: Self-pay | Admitting: Pulmonary Disease

## 2018-10-31 ENCOUNTER — Other Ambulatory Visit: Payer: Self-pay | Admitting: Pulmonary Disease

## 2018-10-31 MED ORDER — OMEPRAZOLE 20 MG PO CPDR: 20.0000 mg | DELAYED_RELEASE_CAPSULE | Freq: Every day | ORAL | 2 refills | Status: DC

## 2018-10-31 MED ORDER — MONTELUKAST SODIUM 10 MG PO TABS: 10.0000 mg | ORAL_TABLET | Freq: Every day | ORAL | 2 refills | Status: DC

## 2018-10-31 NOTE — Telephone Encounter (Signed)
Spoke with pt, she would like refills on Omeprazole and Singular. She has an appt 11/15/2018 so I sent her refills to [facility]. Nothing further is needed.

## 2018-11-01 DIAGNOSIS — J301 Allergic rhinitis due to pollen: Secondary | ICD-10-CM | POA: Diagnosis not present

## 2018-11-01 DIAGNOSIS — J3089 Other allergic rhinitis: Secondary | ICD-10-CM | POA: Diagnosis not present

## 2018-11-14 DIAGNOSIS — J3089 Other allergic rhinitis: Secondary | ICD-10-CM | POA: Diagnosis not present

## 2018-11-14 DIAGNOSIS — J301 Allergic rhinitis due to pollen: Secondary | ICD-10-CM | POA: Diagnosis not present

## 2018-11-15 ENCOUNTER — Ambulatory Visit: Payer: Medicare Other | Admitting: Pulmonary Disease

## 2018-11-19 ENCOUNTER — Other Ambulatory Visit: Payer: Self-pay | Admitting: Pulmonary Disease

## 2018-12-03 DIAGNOSIS — M9903 Segmental and somatic dysfunction of lumbar region: Secondary | ICD-10-CM | POA: Diagnosis not present

## 2018-12-03 DIAGNOSIS — M25551 Pain in right hip: Secondary | ICD-10-CM | POA: Diagnosis not present

## 2018-12-03 DIAGNOSIS — M5137 Other intervertebral disc degeneration, lumbosacral region: Secondary | ICD-10-CM | POA: Diagnosis not present

## 2018-12-03 DIAGNOSIS — M9905 Segmental and somatic dysfunction of pelvic region: Secondary | ICD-10-CM | POA: Diagnosis not present

## 2018-12-04 DIAGNOSIS — M25551 Pain in right hip: Secondary | ICD-10-CM | POA: Diagnosis not present

## 2018-12-04 DIAGNOSIS — M5137 Other intervertebral disc degeneration, lumbosacral region: Secondary | ICD-10-CM | POA: Diagnosis not present

## 2018-12-04 DIAGNOSIS — M9903 Segmental and somatic dysfunction of lumbar region: Secondary | ICD-10-CM | POA: Diagnosis not present

## 2018-12-04 DIAGNOSIS — M9905 Segmental and somatic dysfunction of pelvic region: Secondary | ICD-10-CM | POA: Diagnosis not present

## 2018-12-06 DIAGNOSIS — M9905 Segmental and somatic dysfunction of pelvic region: Secondary | ICD-10-CM | POA: Diagnosis not present

## 2018-12-06 DIAGNOSIS — M9903 Segmental and somatic dysfunction of lumbar region: Secondary | ICD-10-CM | POA: Diagnosis not present

## 2018-12-06 DIAGNOSIS — M25551 Pain in right hip: Secondary | ICD-10-CM | POA: Diagnosis not present

## 2018-12-06 DIAGNOSIS — M5137 Other intervertebral disc degeneration, lumbosacral region: Secondary | ICD-10-CM | POA: Diagnosis not present

## 2018-12-07 DIAGNOSIS — J301 Allergic rhinitis due to pollen: Secondary | ICD-10-CM | POA: Diagnosis not present

## 2018-12-07 DIAGNOSIS — J3089 Other allergic rhinitis: Secondary | ICD-10-CM | POA: Diagnosis not present

## 2018-12-10 DIAGNOSIS — M9903 Segmental and somatic dysfunction of lumbar region: Secondary | ICD-10-CM | POA: Diagnosis not present

## 2018-12-10 DIAGNOSIS — M25551 Pain in right hip: Secondary | ICD-10-CM | POA: Diagnosis not present

## 2018-12-10 DIAGNOSIS — M9905 Segmental and somatic dysfunction of pelvic region: Secondary | ICD-10-CM | POA: Diagnosis not present

## 2018-12-10 DIAGNOSIS — M5137 Other intervertebral disc degeneration, lumbosacral region: Secondary | ICD-10-CM | POA: Diagnosis not present

## 2018-12-11 DIAGNOSIS — M5137 Other intervertebral disc degeneration, lumbosacral region: Secondary | ICD-10-CM | POA: Diagnosis not present

## 2018-12-11 DIAGNOSIS — M9903 Segmental and somatic dysfunction of lumbar region: Secondary | ICD-10-CM | POA: Diagnosis not present

## 2018-12-11 DIAGNOSIS — M9905 Segmental and somatic dysfunction of pelvic region: Secondary | ICD-10-CM | POA: Diagnosis not present

## 2018-12-11 DIAGNOSIS — M25551 Pain in right hip: Secondary | ICD-10-CM | POA: Diagnosis not present

## 2018-12-13 DIAGNOSIS — M25551 Pain in right hip: Secondary | ICD-10-CM | POA: Diagnosis not present

## 2018-12-13 DIAGNOSIS — M5137 Other intervertebral disc degeneration, lumbosacral region: Secondary | ICD-10-CM | POA: Diagnosis not present

## 2018-12-13 DIAGNOSIS — M9903 Segmental and somatic dysfunction of lumbar region: Secondary | ICD-10-CM | POA: Diagnosis not present

## 2018-12-13 DIAGNOSIS — M9905 Segmental and somatic dysfunction of pelvic region: Secondary | ICD-10-CM | POA: Diagnosis not present

## 2018-12-17 DIAGNOSIS — M9905 Segmental and somatic dysfunction of pelvic region: Secondary | ICD-10-CM | POA: Diagnosis not present

## 2018-12-17 DIAGNOSIS — M5137 Other intervertebral disc degeneration, lumbosacral region: Secondary | ICD-10-CM | POA: Diagnosis not present

## 2018-12-17 DIAGNOSIS — M25551 Pain in right hip: Secondary | ICD-10-CM | POA: Diagnosis not present

## 2018-12-17 DIAGNOSIS — M9903 Segmental and somatic dysfunction of lumbar region: Secondary | ICD-10-CM | POA: Diagnosis not present

## 2018-12-18 DIAGNOSIS — M9905 Segmental and somatic dysfunction of pelvic region: Secondary | ICD-10-CM | POA: Diagnosis not present

## 2018-12-18 DIAGNOSIS — M5137 Other intervertebral disc degeneration, lumbosacral region: Secondary | ICD-10-CM | POA: Diagnosis not present

## 2018-12-18 DIAGNOSIS — M25551 Pain in right hip: Secondary | ICD-10-CM | POA: Diagnosis not present

## 2018-12-18 DIAGNOSIS — M9903 Segmental and somatic dysfunction of lumbar region: Secondary | ICD-10-CM | POA: Diagnosis not present

## 2018-12-20 DIAGNOSIS — M5137 Other intervertebral disc degeneration, lumbosacral region: Secondary | ICD-10-CM | POA: Diagnosis not present

## 2018-12-20 DIAGNOSIS — M9905 Segmental and somatic dysfunction of pelvic region: Secondary | ICD-10-CM | POA: Diagnosis not present

## 2018-12-20 DIAGNOSIS — M25551 Pain in right hip: Secondary | ICD-10-CM | POA: Diagnosis not present

## 2018-12-20 DIAGNOSIS — M9903 Segmental and somatic dysfunction of lumbar region: Secondary | ICD-10-CM | POA: Diagnosis not present

## 2018-12-24 DIAGNOSIS — M5137 Other intervertebral disc degeneration, lumbosacral region: Secondary | ICD-10-CM | POA: Diagnosis not present

## 2018-12-24 DIAGNOSIS — M25551 Pain in right hip: Secondary | ICD-10-CM | POA: Diagnosis not present

## 2018-12-24 DIAGNOSIS — M9903 Segmental and somatic dysfunction of lumbar region: Secondary | ICD-10-CM | POA: Diagnosis not present

## 2018-12-24 DIAGNOSIS — M9905 Segmental and somatic dysfunction of pelvic region: Secondary | ICD-10-CM | POA: Diagnosis not present

## 2018-12-25 DIAGNOSIS — M9903 Segmental and somatic dysfunction of lumbar region: Secondary | ICD-10-CM | POA: Diagnosis not present

## 2018-12-25 DIAGNOSIS — M5137 Other intervertebral disc degeneration, lumbosacral region: Secondary | ICD-10-CM | POA: Diagnosis not present

## 2018-12-25 DIAGNOSIS — M25551 Pain in right hip: Secondary | ICD-10-CM | POA: Diagnosis not present

## 2018-12-25 DIAGNOSIS — M9905 Segmental and somatic dysfunction of pelvic region: Secondary | ICD-10-CM | POA: Diagnosis not present

## 2018-12-31 DIAGNOSIS — M9905 Segmental and somatic dysfunction of pelvic region: Secondary | ICD-10-CM | POA: Diagnosis not present

## 2018-12-31 DIAGNOSIS — M5137 Other intervertebral disc degeneration, lumbosacral region: Secondary | ICD-10-CM | POA: Diagnosis not present

## 2018-12-31 DIAGNOSIS — M25551 Pain in right hip: Secondary | ICD-10-CM | POA: Diagnosis not present

## 2018-12-31 DIAGNOSIS — M9903 Segmental and somatic dysfunction of lumbar region: Secondary | ICD-10-CM | POA: Diagnosis not present

## 2019-01-01 DIAGNOSIS — J45909 Unspecified asthma, uncomplicated: Secondary | ICD-10-CM | POA: Diagnosis not present

## 2019-01-01 DIAGNOSIS — Z20828 Contact with and (suspected) exposure to other viral communicable diseases: Secondary | ICD-10-CM | POA: Diagnosis not present

## 2019-01-07 DIAGNOSIS — M25551 Pain in right hip: Secondary | ICD-10-CM | POA: Diagnosis not present

## 2019-01-07 DIAGNOSIS — M5137 Other intervertebral disc degeneration, lumbosacral region: Secondary | ICD-10-CM | POA: Diagnosis not present

## 2019-01-07 DIAGNOSIS — M9903 Segmental and somatic dysfunction of lumbar region: Secondary | ICD-10-CM | POA: Diagnosis not present

## 2019-01-07 DIAGNOSIS — M9905 Segmental and somatic dysfunction of pelvic region: Secondary | ICD-10-CM | POA: Diagnosis not present

## 2019-01-08 NOTE — Progress Notes (Signed)
@Patient  ID: [name], female    DOB: [birth year], 69 y.o.   MRN: [id number]  Chief Complaint  Patient presents with  . Follow-up    22yr f/u. States her breathing has been great since last visit. No SOB or coughing.     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 Elsworth Soho  01/09/2019  - Visit   69 year old female never smoker followed in our office for asthma.  She reports she is maintained on Symbicort 80 1 puff every 12 hours.  Patient continues to work with allergy asthma with immunotherapy she reports that on recent allergy testing her allergies had decreased by around 50%.  Overall she has no acute complaints.  She reports has been doing well.  She continues to be maintained on omeprazole 20 mg daily for management of acid reflux.  She did trial coming off of omeprazole.  As soon as she came off the omeprazole she had worsened acid reflux and persistent symptoms.  She tried apple cider vinegar to help with management of her acid reflux.  This did not work.  She is now back on omeprazole 20 mg daily.  She does believe that Singulair may be directly causing her acid reflux.  She is unsure if she should trial coming off of this.  Questionaires / Pulmonary Flowsheets:   MMRC: mMRC Dyspnea Scale mMRC Score  01/09/2019 0   Tests:   02/26/2018-chest x-ray-no active cardiopulmonary disease, stable scarring at left base, aortic arthrosclerosis  09/10/2013-CT maxillofacial-no evidence of sinusitis  01/24/2018-spirometry-normal  FENO:  No results found for: NITRICOXIDE  PFT: No flowsheet data found.  WALK:  No flowsheet data found.  Imaging: No results found.  Lab Results:  CBC    Component Value Date/Time   WBC 11.6 (H) 02/24/2013 1650   RBC 4.14  02/24/2013 1650   HGB 13.0 02/24/2013 1650   HCT 38.4 02/24/2013 1650   PLT 375 02/24/2013 1650   MCV 92.8 02/24/2013 1650   MCV 99.9 (A) 02/24/2013 1429   MCH 31.4 02/24/2013 1650   MCHC 33.9 02/24/2013 1650   RDW 12.9 02/24/2013 1650   LYMPHSABS 1.9 02/24/2013 1650   MONOABS 1.4 (H) 02/24/2013 1650   EOSABS 0.1 02/24/2013 1650   BASOSABS 0.0 02/24/2013 1650    BMET    Component Value Date/Time   NA 137 02/24/2013 1650   K 4.9 02/24/2013 1650   CL 93 (L) 02/24/2013 1650   CO2 29 02/24/2013 1650   GLUCOSE 126 (H) 02/24/2013 1650   BUN 7 02/24/2013 1650   CREATININE 0.69 02/24/2013 1650   CALCIUM 9.6 02/24/2013 1650   GFRNONAA >90 02/24/2013 1650   GFRAA >90 02/24/2013 1650    BNP No results found for: BNP  ProBNP No results found for: PROBNP  Specialty Problems      Pulmonary Problems   Allergic rhinitis    Qualifier: Diagnosis of  By: Wynetta Emery RN, Erika        Moderate persistent asthma without complication    [id number] Arlyce Harman: normal       COUGH    Qualifier: Diagnosis of  By: Elsworth Soho MD, Leanna Sato           Allergies  Allergen Reactions  . Latex     Immunization History  Administered Date(s) Administered  .  Influenza Split 11/04/2010, 01/04/2012  . Influenza, High Dose Seasonal PF 09/21/2017, 11/01/2017  . Influenza,inj,Quad PF,6+ Mos 08/31/2012, 10/29/2014, 10/21/2015  . Pneumococcal Conjugate-13 04/04/2015  . Pneumococcal Polysaccharide-23 04/03/2017  . Tdap 02/03/2011    Past Medical History:  Diagnosis Date  . Allergic rhinitis   . Arthritis   . Asthma   . GERD (gastroesophageal reflux disease)   . Hyperlipemia   . Polio   . Unspecified essential hypertension    clearance Dr Schuyler Amor with note on chart    Tobacco History: Social History   Tobacco Use  Smoking Status Never Smoker  Smokeless Tobacco Never Used   Counseling given: Yes   Continue to not smoke  Outpatient Encounter Medications as of 01/09/2019  Medication  Sig  . albuterol (PROVENTIL) (2.5 MG/3ML) 0.083% nebulizer solution Take 3 mLs (2.5 mg total) by nebulization every 6 (six) hours as needed for wheezing or shortness of breath.  Marland Kitchen alendronate (FOSAMAX) 70 MG tablet Take 70 mg by mouth once a week.  . budesonide-formoterol (SYMBICORT) 160-4.5 MCG/ACT inhaler Inhale 2 puffs into the lungs 2 (two) times daily.  . cholecalciferol (VITAMIN D) 1000 UNITS tablet Take 2,000 Units by mouth every morning.   . Cyanocobalamin (VITAMIN B-12 CR) 1500 MCG TBCR Take 1 tablet by mouth daily. 1000mg  daily  . fluticasone (FLONASE) 50 MCG/ACT nasal spray Place 2 sprays 2 (two) times daily into both nostrils.  Marland Kitchen loratadine (CLARITIN) 10 MG tablet Take 10 mg by mouth daily.  Marland Kitchen losartan-hydrochlorothiazide (HYZAAR) 100-25 MG per tablet Take 1 tablet by mouth daily.  . montelukast (SINGULAIR) 10 MG tablet Take 1 tablet (10 mg total) by mouth at bedtime.  Marland Kitchen omeprazole (PRILOSEC) 20 MG capsule Take 1 capsule (20 mg total) by mouth daily.  . budesonide-formoterol (SYMBICORT) 80-4.5 MCG/ACT inhaler Inhale 2 puffs into the lungs 2 (two) times daily.  . [DISCONTINUED] benzonatate (TESSALON) 200 MG capsule Take 1 capsule (200 mg total) by mouth 3 (three) times daily as needed for cough.  . [DISCONTINUED] predniSONE (DELTASONE) 10 MG tablet 2 x 5 days, 1 x 5 days then stop  . [DISCONTINUED] predniSONE (DELTASONE) 10 MG tablet 4 tabs x2days,3tabs x 2days,2 tabs x 2 days,1 tab x 2 days  . [DISCONTINUED] SYMBICORT 80-4.5 MCG/ACT inhaler USE 2 PUFFS TWICE A DAY.   No facility-administered encounter medications on file as of 01/09/2019.    Review of Systems  Review of Systems  Constitutional: Negative for activity change, fatigue and fever.  HENT: Negative for sinus pressure, sinus pain and sore throat.   Respiratory: Negative for cough, shortness of breath and wheezing.   Cardiovascular: Negative for chest pain and palpitations.  Gastrointestinal: Negative for diarrhea, nausea  and vomiting.  Musculoskeletal: Negative for arthralgias.  Neurological: Negative for dizziness.  Psychiatric/Behavioral: Negative for sleep disturbance. The patient is not nervous/anxious.     Physical Exam  BP 128/84 (BP Location: Left Arm, Patient Position: Sitting, Cuff Size: Normal)   Pulse 91   Temp 97.6 F (36.4 C) (Temporal)   Ht 5\' 2"  (1.575 m)   Wt 149 lb (67.6 kg)   SpO2 96% Comment: on RA  BMI 27.25 kg/m   Wt Readings from Last 5 Encounters:  01/09/19 149 lb (67.6 kg)  02/26/18 148 lb (67.1 kg)  11/16/17 148 lb (67.1 kg)  11/10/16 138 lb (62.6 kg)  11/17/15 147 lb 9.6 oz (67 kg)   BMI Readings from Last 5 Encounters:  01/09/19 27.25 kg/m  02/26/18 27.07 kg/m  11/16/17  25.40 kg/m  11/10/16 23.69 kg/m  11/17/15 25.34 kg/m    Physical Exam Vitals and nursing note reviewed.  Constitutional:      General: She is not in acute distress.    Appearance: Normal appearance.  HENT:     Head: Normocephalic and atraumatic.     Right Ear: Tympanic membrane, ear canal and external ear normal.     Left Ear: Tympanic membrane, ear canal and external ear normal.     Mouth/Throat:     Mouth: Mucous membranes are moist.     Pharynx: Oropharynx is clear.  Eyes:     Pupils: Pupils are equal, round, and reactive to light.  Cardiovascular:     Rate and Rhythm: Normal rate and regular rhythm.     Pulses: Normal pulses.     Heart sounds: Normal heart sounds. No murmur.  Pulmonary:     Effort: Pulmonary effort is normal. No respiratory distress.     Breath sounds: Normal breath sounds. No decreased air movement. No decreased breath sounds, wheezing or rales.  Musculoskeletal:     Cervical back: Normal range of motion.  Skin:    General: Skin is warm and dry.     Capillary Refill: Capillary refill takes less than 2 seconds.  Neurological:     General: No focal deficit present.     Mental Status: She is alert and oriented to person, place, and time. Mental status is at  baseline.     Gait: Gait normal.  Psychiatric:        Mood and Affect: Mood normal.        Behavior: Behavior normal.        Thought Content: Thought content normal.        Judgment: Judgment normal.      Assessment & Plan:   G E REFLUX Plan: Continue omeprazole Continue to follow lifestyle modifications  Moderate persistent asthma without complication Plan: Okay to start Symbicort 80 as needed Patient to contact our office with questions or concerns Continue Singulair, offered trial off of Singulair but patient would like to remain on at this time Continue to work with allergy  Allergic rhinitis Plan: Continue to work with allergy    Return in about 1 year (around 01/09/2020), or if symptoms worsen or fail to improve, for Follow up with Dr. Elsworth Soho.   Lauraine Rinne, NP 01/09/2019   This appointment required 31 minutes of patient care (this includes precharting, chart review, review of results, face-to-face care, etc.).

## 2019-01-09 ENCOUNTER — Ambulatory Visit: Payer: Medicare Other | Admitting: Pulmonary Disease

## 2019-01-09 ENCOUNTER — Ambulatory Visit (INDEPENDENT_AMBULATORY_CARE_PROVIDER_SITE_OTHER): Payer: Medicare Other | Admitting: Pulmonary Disease

## 2019-01-09 ENCOUNTER — Encounter: Payer: Self-pay | Admitting: Pulmonary Disease

## 2019-01-09 ENCOUNTER — Other Ambulatory Visit: Payer: Self-pay

## 2019-01-09 VITALS — BP 128/84 | HR 91 | Temp 97.6°F | Ht 62.0 in | Wt 149.0 lb

## 2019-01-09 DIAGNOSIS — K219 Gastro-esophageal reflux disease without esophagitis: Secondary | ICD-10-CM | POA: Diagnosis not present

## 2019-01-09 DIAGNOSIS — J301 Allergic rhinitis due to pollen: Secondary | ICD-10-CM

## 2019-01-09 DIAGNOSIS — J4540 Moderate persistent asthma, uncomplicated: Secondary | ICD-10-CM | POA: Diagnosis not present

## 2019-01-09 MED ORDER — BUDESONIDE-FORMOTEROL FUMARATE 80-4.5 MCG/ACT IN AERO: 2.0000 | INHALATION_SPRAY | Freq: Two times a day (BID) | RESPIRATORY_TRACT | 0 refills | Status: DC

## 2019-01-09 NOTE — Assessment & Plan Note (Signed)
Plan: Okay to start Symbicort 80 as needed Patient to contact our office with questions or concerns Continue Singulair, offered trial off of Singulair but patient would like to remain on at this time Continue to work with allergy

## 2019-01-09 NOTE — Assessment & Plan Note (Signed)
Plan: Continue to work with allergy

## 2019-01-09 NOTE — Patient Instructions (Addendum)
You were seen today by Lauraine Rinne, NP  for:   1. Non-seasonal allergic rhinitis due to pollen  Continue to work with allergy asthma  Continue Singulair  2. Moderate persistent asthma without complication  Start Symbicort 80 >>> 2 puffs in the morning AS NEEDED, rinse out your mouth after use, Can use 12 hours later 2 puffs AS NEEDED, rinse after use  Continue Singulair  Continue to work with allergy   3. Gastroesophageal reflux disease without esophagitis  Omeprazole 20 mg tablet  >>>Please take 1 tablet daily 15 minutes to 30 minutes before your first meal of the day as well as before your other medications >>>Try to take at the same time each day >>>take this medication daily  GERD management: >>>Avoid laying flat until 2 hours after meals >>>Elevate head of the bed including entire chest >>>Reduce size of meals and amount of fat, acid, spices, caffeine and sweets >>>If you are smoking, Please stop! >>>Decrease alcohol consumption >>>Work on maintaining a healthy weight with normal BMI    Follow Up:    Return in about 1 year (around 01/09/2020), or if symptoms worsen or fail to improve, for Follow up with Dr. Elsworth Soho.   Please do your part to reduce the spread of COVID-19:      Reduce your risk of any infection  and COVID19 by using the similar precautions used for avoiding the common cold or flu:  Marland Kitchen Wash your hands often with soap and warm water for at least 20 seconds.  If soap and water are not readily available, use an alcohol-based hand sanitizer with at least 60% alcohol.  . If coughing or sneezing, cover your mouth and nose by coughing or sneezing into the elbow areas of your shirt or coat, into a tissue or into your sleeve (not your hands). Langley Gauss A MASK when in public  . Avoid shaking hands with others and consider head nods or verbal greetings only. . Avoid touching your eyes, nose, or mouth with unwashed hands.  . Avoid close contact with people who are  sick. . Avoid places or events with large numbers of people in one location, like concerts or sporting events. . If you have some symptoms but not all symptoms, continue to monitor at home and seek medical attention if your symptoms worsen. . If you are having a medical emergency, call 911.   [place] / e-Visit: [email or web address]         [facility]: [facility]: [id number]                   [facility]: [id number]     It is flu season:   >>> Best ways to protect herself from the flu: Receive the yearly flu vaccine, practice good hand hygiene washing with soap and also using hand sanitizer when available, eat a nutritious meals, get adequate rest, hydrate appropriately   Please contact the office if your symptoms worsen or you have concerns that you are not improving.   Thank you for choosing [facility] for your healthcare, and for allowing [place] to partner with you on your healthcare journey. I am thankful to be able to provide care to you today.   Wyn Quaker FNP-C

## 2019-01-09 NOTE — Assessment & Plan Note (Signed)
Plan: Continue omeprazole Continue to follow lifestyle modifications

## 2019-01-14 DIAGNOSIS — M9903 Segmental and somatic dysfunction of lumbar region: Secondary | ICD-10-CM | POA: Diagnosis not present

## 2019-01-14 DIAGNOSIS — M5137 Other intervertebral disc degeneration, lumbosacral region: Secondary | ICD-10-CM | POA: Diagnosis not present

## 2019-01-14 DIAGNOSIS — M9905 Segmental and somatic dysfunction of pelvic region: Secondary | ICD-10-CM | POA: Diagnosis not present

## 2019-01-14 DIAGNOSIS — M25551 Pain in right hip: Secondary | ICD-10-CM | POA: Diagnosis not present

## 2019-01-17 DIAGNOSIS — J3089 Other allergic rhinitis: Secondary | ICD-10-CM | POA: Diagnosis not present

## 2019-01-17 DIAGNOSIS — J301 Allergic rhinitis due to pollen: Secondary | ICD-10-CM | POA: Diagnosis not present

## 2019-01-24 DIAGNOSIS — M9903 Segmental and somatic dysfunction of lumbar region: Secondary | ICD-10-CM | POA: Diagnosis not present

## 2019-01-24 DIAGNOSIS — M9905 Segmental and somatic dysfunction of pelvic region: Secondary | ICD-10-CM | POA: Diagnosis not present

## 2019-01-24 DIAGNOSIS — M25551 Pain in right hip: Secondary | ICD-10-CM | POA: Diagnosis not present

## 2019-01-24 DIAGNOSIS — M5137 Other intervertebral disc degeneration, lumbosacral region: Secondary | ICD-10-CM | POA: Diagnosis not present

## 2019-01-25 DIAGNOSIS — J301 Allergic rhinitis due to pollen: Secondary | ICD-10-CM | POA: Diagnosis not present

## 2019-01-25 DIAGNOSIS — J3089 Other allergic rhinitis: Secondary | ICD-10-CM | POA: Diagnosis not present

## 2019-01-31 DIAGNOSIS — M9905 Segmental and somatic dysfunction of pelvic region: Secondary | ICD-10-CM | POA: Diagnosis not present

## 2019-01-31 DIAGNOSIS — M25551 Pain in right hip: Secondary | ICD-10-CM | POA: Diagnosis not present

## 2019-01-31 DIAGNOSIS — M5137 Other intervertebral disc degeneration, lumbosacral region: Secondary | ICD-10-CM | POA: Diagnosis not present

## 2019-01-31 DIAGNOSIS — M9903 Segmental and somatic dysfunction of lumbar region: Secondary | ICD-10-CM | POA: Diagnosis not present

## 2019-02-01 ENCOUNTER — Other Ambulatory Visit: Payer: Self-pay | Admitting: Pulmonary Disease

## 2019-02-12 DIAGNOSIS — N950 Postmenopausal bleeding: Secondary | ICD-10-CM | POA: Diagnosis not present

## 2019-02-15 ENCOUNTER — Other Ambulatory Visit: Payer: Self-pay | Admitting: Family Medicine

## 2019-02-20 ENCOUNTER — Other Ambulatory Visit: Payer: Self-pay | Admitting: Family Medicine

## 2019-02-20 DIAGNOSIS — N950 Postmenopausal bleeding: Secondary | ICD-10-CM

## 2019-02-22 DIAGNOSIS — J3089 Other allergic rhinitis: Secondary | ICD-10-CM | POA: Diagnosis not present

## 2019-02-22 DIAGNOSIS — J301 Allergic rhinitis due to pollen: Secondary | ICD-10-CM | POA: Diagnosis not present

## 2019-02-26 DIAGNOSIS — J3089 Other allergic rhinitis: Secondary | ICD-10-CM | POA: Diagnosis not present

## 2019-02-26 DIAGNOSIS — J301 Allergic rhinitis due to pollen: Secondary | ICD-10-CM | POA: Diagnosis not present

## 2019-02-27 ENCOUNTER — Other Ambulatory Visit: Payer: Self-pay | Admitting: Obstetrics & Gynecology

## 2019-02-27 DIAGNOSIS — N950 Postmenopausal bleeding: Secondary | ICD-10-CM | POA: Diagnosis not present

## 2019-02-27 DIAGNOSIS — C541 Malignant neoplasm of endometrium: Secondary | ICD-10-CM | POA: Diagnosis not present

## 2019-02-28 ENCOUNTER — Other Ambulatory Visit: Payer: Medicare Other

## 2019-03-01 DIAGNOSIS — J3089 Other allergic rhinitis: Secondary | ICD-10-CM | POA: Diagnosis not present

## 2019-03-01 DIAGNOSIS — J301 Allergic rhinitis due to pollen: Secondary | ICD-10-CM | POA: Diagnosis not present

## 2019-03-05 ENCOUNTER — Encounter: Payer: Self-pay | Admitting: Gynecologic Oncology

## 2019-03-05 DIAGNOSIS — N950 Postmenopausal bleeding: Secondary | ICD-10-CM | POA: Diagnosis not present

## 2019-03-07 ENCOUNTER — Telehealth: Payer: Self-pay | Admitting: *Deleted

## 2019-03-07 NOTE — Telephone Encounter (Signed)
Called the patient and left a message to call the office back. Patient needs to be scheduled for a new patient appt.  

## 2019-03-08 ENCOUNTER — Telehealth: Payer: Self-pay | Admitting: *Deleted

## 2019-03-08 DIAGNOSIS — J4530 Mild persistent asthma, uncomplicated: Secondary | ICD-10-CM | POA: Diagnosis not present

## 2019-03-08 DIAGNOSIS — J3089 Other allergic rhinitis: Secondary | ICD-10-CM | POA: Diagnosis not present

## 2019-03-08 DIAGNOSIS — K219 Gastro-esophageal reflux disease without esophagitis: Secondary | ICD-10-CM | POA: Diagnosis not present

## 2019-03-08 DIAGNOSIS — J301 Allergic rhinitis due to pollen: Secondary | ICD-10-CM | POA: Diagnosis not present

## 2019-03-08 NOTE — Telephone Encounter (Signed)
Patient called back and scheduled her new patient appt. Patient given address and phone number. Patient also given the policy for visitors, mask and parking

## 2019-03-10 NOTE — H&P (View-Only) (Signed)
GYNECOLOGIC ONCOLOGY NEW PATIENT CONSULTATION   Patient Name: [name]  Patient Age: 69 y.o. Date of Service: 3/10 Referring Provider: Maurice Small, MD [address],  [place] [address]   Primary Care Provider: Maurice Small, MD Consulting Provider: Jeral Pinch, MD   Assessment/Plan:  69 year old with clinical stage I grade 1 endometrial adenocarcinoma with a significant surgical history.  We reviewed the nature of endometrial cancer and its recommended surgical staging, including total hysterectomy, bilateral salpingo-oophorectomy, and lymph node assessment. The patient is a suitable candidate for staging via a minimally invasive approach to surgery.  We reviewed that robotic assistance would be used to complete the surgery.   That being said, the patient has significant surgical history and understands the risk that if there is significant adhesive disease, I may need to convert to laparotomy.  We also discussed the very unlikely possibility that I would abort the plan for hysterectomy completely and proceed with D&C and Mirena IUD placement.  We discussed that most endometrial cancer is detected early and that decisions regarding adjuvant therapy will be made based on her final pathology.   We reviewed the sentinel lymph node technique. Risks and benefits of sentinel lymph node biopsy was reviewed. We reviewed the technique and ICG dye. The patient DOES NOT have an iodine allergy or known liver dysfunction. We reviewed the false negative rate (0.4%), and that 3% of patients with metastatic disease will not have it detected by SLN biopsy in endometrial cancer. A low risk of allergic reaction to the dye, <0.2% for ICG, has been reported. We also discussed that in the case of failed mapping, which occurs 40% of the time, a bilateral or unilateral lymphadenectomy will be performed at the surgeon's discretion.   Potential benefits of sentinel nodes including a higher  detection rate for metastasis due to ultrastaging and potential reduction in operative morbidity. However, there remains uncertainty as to the role for treatment of micrometastatic disease. Further, the benefit of operative morbidity associated with the SLN technique in endometrial cancer is not yet completely known. In other patient populations (e.g. the cervical cancer population) there has been observed reductions in morbidity with SLN biopsy compared to pelvic lymphadenectomy. Lymphedema, nerve dysfunction and lymphocysts are all potential risks with the SLN technique as with complete lymphadenectomy. Additional risks to the patient include the risk of damage to an internal organ while operating in an altered view (e.g. the black and white image of the robotic fluorescence imaging mode).   The patient was consented for a robotic assisted hysterectomy, bilateral salpingo-oophorectomy, sentinel lymph node evaluation, possible lymph node dissection, possible laparotomy.  If there is significant scar tissue, then we will proceed with total abdominal hysterectomy, bilateral salpingo-oophorectomy, pelvic lymph node dissection and any other indicated procedures.  We typically discussed the risk of injury to bowel if there is a lot of adhesive disease which may require bowel resection and reanastomosis and, unlikely, formation of an ostomy.  The risks of surgery were discussed in detail and she understands these to include infection; wound separation; hernia; vaginal cuff separation, injury to adjacent organs such as bowel, bladder, blood vessels, ureters and nerves; bleeding which may require blood transfusion; anesthesia risk; thromboembolic events; possible death; unforeseen complications; possible need for re-exploration; medical complications such as heart attack, stroke, pleural effusion and pneumonia; and, if full lymphadenectomy is performed the risk of lymphedema and lymphocyst. The patient will receive DVT  and antibiotic prophylaxis as indicated. She voiced a  clear understanding. She had the opportunity to ask questions. Perioperative instructions were reviewed with her. Prescriptions for post-op medications were sent to her pharmacy of choice.  From a mobility standpoint, the patient needs to have her left leg supported but it is mobile otherwise.  A copy of this note was sent to the patient's referring provider.   50 minutes of total time was spent for this patient encounter, including preparation, face-to-face counseling with the patient and coordination of care, and documentation of the encounter.   Jeral Pinch, MD  Division of Gynecologic Oncology  Department of Obstetrics and Gynecology  [facility]  ___________________________________________  Chief Complaint: Chief Complaint  Patient presents with  . Endometrial cancer ([place])    Follow up    History of Present Illness:  [name] is a 69 y.o. y.o. female who is seen in consultation at the request of Dr. Nelda Marseille for an evaluation of endometrial cancer.  The patient presented with postmenopausal bleeding starting at the beginning of February.  She was seen on 2/24 and underwent endometrial biopsy revealing endometrioid adenocarcinoma.  Reports daily bleeding for the last 33 days including since her biopsy.  She endorses mild cramping as well as intermittent passage of clots.  She has been wearing an always pad which she changes 4 times a day.  She states that this is not saturated when she changes it.  Overall, she reports some weight gain in the last several months.  She denies any nausea or emesis.  She denies early satiety but reports some abdominal bloating.  Endorses regular bowel and bladder function.  Her surgical history is notable for 2 prior C-sections and exploratory laparotomy due to a bowel obstruction secondary to adhesions from prior surgery.  Her knowledge, no bowel resection was  required during the surgery.  Patient denies any shortness of breath or chest pain with ambulation.  She has asthma that she states is well controlled.  She recently had her yearly physical.  Her family history is notable for prostate, stomach, colon and possible GYN cancers in multiple family members on her mother side.  Her mother and 2 of her 3 sisters all had hysterectomies which the patient thinks were for cancer although is unsure what type.  PAST MEDICAL HISTORY:  Past Medical History:  Diagnosis Date  . Allergic rhinitis   . Arthritis   . Asthma   . GERD (gastroesophageal reflux disease)   . Hyperlipemia   . Polio   . Unspecified essential hypertension    clearance Dr Schuyler Amor with note on chart     PAST SURGICAL HISTORY:  Past Surgical History:  Procedure Laterality Date  . BREAST BIOPSY Bilateral    benign  . BREAST SURGERY     bilateral fibroid tumors removed  . CARPAL TUNNEL RELEASE     right  . CESAREAN SECTION     x 2  . COLON SURGERY     scar tissue removed  . leg surgery     bilateral, numerous  . tooth implant     titanium/ front upper right  . TOTAL HIP ARTHROPLASTY     right  . TOTAL HIP REVISION  08/15/2011   Procedure: TOTAL HIP REVISION;  Surgeon: Mauri Pole, MD;  Location: [facility];  Service: Orthopedics;  Laterality: Right;  . vocal surgery     cyst removed  . WRIST SURGERY  2009   left    OB/GYN HISTORY:  OB History  Gravida  Para Term Preterm AB Living  2 2          SAB TAB Ectopic Multiple Live Births               # Outcome Date GA Lbr Len/2nd Weight Sex Delivery Anes PTL Lv  2 Para           1 Para             No LMP recorded. Patient is postmenopausal.  Age at menarche: 53  Age at menopause: 22 Hx of HRT: denies Hx of STDs: denies Last pap: 2014 History of abnormal pap smears: no  SCREENING STUDIES:  Last mammogram: 10/2018  Last colonoscopy: 08/2008  MEDICATIONS: Outpatient Encounter Medications as of 03/13/2019   Medication Sig  . albuterol (PROVENTIL) (2.5 MG/3ML) 0.083% nebulizer solution Take 3 mLs (2.5 mg total) by nebulization every 6 (six) hours as needed for wheezing or shortness of breath.  Marland Kitchen alendronate (FOSAMAX) 70 MG tablet Take 70 mg by mouth once a week.  . budesonide-formoterol (SYMBICORT) 160-4.5 MCG/ACT inhaler Inhale 2 puffs into the lungs 2 (two) times daily.  . budesonide-formoterol (SYMBICORT) 80-4.5 MCG/ACT inhaler Inhale 2 puffs into the lungs 2 (two) times daily.  . cholecalciferol (VITAMIN D) 1000 UNITS tablet Take 2,000 Units by mouth every morning.   . Cyanocobalamin (VITAMIN B-12 CR) 1500 MCG TBCR Take 1 tablet by mouth daily. 1000mg  daily  . fluticasone (FLONASE) 50 MCG/ACT nasal spray Place 2 sprays 2 (two) times daily into both nostrils.  Marland Kitchen loratadine (CLARITIN) 10 MG tablet Take 10 mg by mouth daily.  Marland Kitchen losartan-hydrochlorothiazide (HYZAAR) 100-25 MG per tablet Take 1 tablet by mouth daily.  . montelukast (SINGULAIR) 10 MG tablet TAKE ONE TABLET AT BEDTIME.  Marland Kitchen omeprazole (PRILOSEC) 20 MG capsule TAKE 1 CAPSULE EVERY DAY.  Marland Kitchen oxyCODONE (OXY IR/ROXICODONE) 5 MG immediate release tablet Take 1 tablet (5 mg total) by mouth every 4 (four) hours as needed for severe pain. For AFTER surgery, do not take and drive  . senna-docusate (SENOKOT-S) 8.6-50 MG tablet Take 2 tablets by mouth at bedtime. For AFTER surgery, do not take if having diarrhea   No facility-administered encounter medications on file as of 03/13/2019.    ALLERGIES:  Allergies  Allergen Reactions  . Molds & Smuts Cough  . Latex      FAMILY HISTORY:  Family History  Problem Relation Age of Onset  . Kidney cancer Father   . CAD Father   . Melanoma Mother        STAGE 3  . Breast cancer Mother   . Heart attack Mother   . Colon cancer Maternal Grandfather   . Breast cancer Maternal Grandmother        meta to stomach     SOCIAL HISTORY:    Social Connections:   . Frequency of Communication with  Friends and Family: Not on file  . Frequency of Social Gatherings with Friends and Family: Not on file  . Attends Religious Services: Not on file  . Active Member of Clubs or Organizations: Not on file  . Attends Archivist Meetings: Not on file  . Marital Status: Not on file    REVIEW OF SYSTEMS:  Denies appetite changes, fevers, chills, fatigue, unexplained weight changes. Denies hearing loss, neck lumps or masses, mouth sores, ringing in ears or voice changes. Denies cough or wheezing.  Denies shortness of breath. Denies chest pain or palpitations. Denies leg swelling. Denies abdominal distention, pain,  blood in stools, constipation, diarrhea, nausea, vomiting, or early satiety. Denies pain with intercourse, dysuria, frequency, hematuria or incontinence. Denies hot flashes, pelvic pain, vaginal bleeding or vaginal discharge.   Denies joint pain, back pain or muscle pain/cramps. Denies itching, rash, or wounds. Denies dizziness, headaches, numbness or seizures. Denies swollen lymph nodes or glands, denies easy bruising or bleeding. Denies anxiety, depression, confusion, or decreased concentration.  Physical Exam:  Vital Signs for this encounter:  Blood pressure (!) 154/75, pulse 80, temperature 98 F (36.7 C), temperature source Temporal, weight 152 lb 4 oz (69.1 kg), SpO2 98 %. Body mass index is 27.85 kg/m. General: Alert, oriented, no acute distress.  HEENT: Normocephalic, atraumatic. Sclera anicteric.  Chest: Clear to auscultation bilaterally. No wheezes, rhonchi, or rales. Cardiovascular: Regular rate and rhythm, no murmurs, rubs, or gallops.  Abdomen: Normoactive bowel sounds. Soft, nondistended, nontender to palpation. No masses or hepatosplenomegaly appreciated. No palpable fluid wave.  Well-healed Pfannenstiel incision and vertical midline incision. Extremities: Grossly normal range of motion. Warm, well perfused. No edema bilaterally.  Skin: No rashes or  lesions.  Lymphatics: No cervical, supraclavicular, or inguinal adenopathy.  GU:  Normal external female genitalia. No lesions. No discharge or bleeding.             Bladder/urethra:  No lesions or masses, well supported bladder             Vagina: Mildly atrophic vaginal mucosa.  Patient tolerated speculum exam as well as bimanual poorly.             Cervix: Normal appearing, no lesions although only able to visualize anterior cervix given patient's discomfort.             Uterus: Small, somewhat mobile, no parametrial involvement or nodularity although again limited by patient's toleration of the exam.             Adnexa: No masses appreciated.  Rectal: Deferred.  LABORATORY AND RADIOLOGIC DATA:  Outside medical records were reviewed to synthesize the above history, along with the history and physical obtained during the visit.   Lab Results  Component Value Date   WBC 11.6 (H) 02/24/2013   HGB 13.0 02/24/2013   HCT 38.4 02/24/2013   PLT 375 02/24/2013   GLUCOSE 126 (H) 02/24/2013   ALT 33 02/24/2013   AST 32 02/24/2013   NA 137 02/24/2013   K 4.9 02/24/2013   CL 93 (L) 02/24/2013   CREATININE 0.69 02/24/2013   BUN 7 02/24/2013   CO2 29 02/24/2013   INR 0.95 08/03/2011   EMB on 2/24: Gr 1 endometrioid adenocarcinoma  Pelvic ultrasound on 3/2: Uterus measures 7.9 x 5.7 x 3.7 cm with an endometrial lining of 2.9 cm.  Bilateral ovaries normal in size and shape.

## 2019-03-10 NOTE — Progress Notes (Signed)
GYNECOLOGIC ONCOLOGY NEW PATIENT CONSULTATION   Patient Name: [name]  Patient Age: 69 y.o. Date of Service: 3/10 Referring Provider: Maurice Small, MD [address],  [place] [address]   Primary Care Provider: Maurice Small, MD Consulting Provider: Jeral Pinch, MD   Assessment/Plan:  69 year old with clinical stage I grade 1 endometrial adenocarcinoma with a significant surgical history.  We reviewed the nature of endometrial cancer and its recommended surgical staging, including total hysterectomy, bilateral salpingo-oophorectomy, and lymph node assessment. The patient is a suitable candidate for staging via a minimally invasive approach to surgery.  We reviewed that robotic assistance would be used to complete the surgery.   That being said, the patient has significant surgical history and understands the risk that if there is significant adhesive disease, I may need to convert to laparotomy.  We also discussed the very unlikely possibility that I would abort the plan for hysterectomy completely and proceed with D&C and Mirena IUD placement.  We discussed that most endometrial cancer is detected early and that decisions regarding adjuvant therapy will be made based on her final pathology.   We reviewed the sentinel lymph node technique. Risks and benefits of sentinel lymph node biopsy was reviewed. We reviewed the technique and ICG dye. The patient DOES NOT have an iodine allergy or known liver dysfunction. We reviewed the false negative rate (0.4%), and that 3% of patients with metastatic disease will not have it detected by SLN biopsy in endometrial cancer. A low risk of allergic reaction to the dye, <0.2% for ICG, has been reported. We also discussed that in the case of failed mapping, which occurs 40% of the time, a bilateral or unilateral lymphadenectomy will be performed at the surgeon's discretion.   Potential benefits of sentinel nodes including a higher  detection rate for metastasis due to ultrastaging and potential reduction in operative morbidity. However, there remains uncertainty as to the role for treatment of micrometastatic disease. Further, the benefit of operative morbidity associated with the SLN technique in endometrial cancer is not yet completely known. In other patient populations (e.g. the cervical cancer population) there has been observed reductions in morbidity with SLN biopsy compared to pelvic lymphadenectomy. Lymphedema, nerve dysfunction and lymphocysts are all potential risks with the SLN technique as with complete lymphadenectomy. Additional risks to the patient include the risk of damage to an internal organ while operating in an altered view (e.g. the black and white image of the robotic fluorescence imaging mode).   The patient was consented for a robotic assisted hysterectomy, bilateral salpingo-oophorectomy, sentinel lymph node evaluation, possible lymph node dissection, possible laparotomy.  If there is significant scar tissue, then we will proceed with total abdominal hysterectomy, bilateral salpingo-oophorectomy, pelvic lymph node dissection and any other indicated procedures.  We typically discussed the risk of injury to bowel if there is a lot of adhesive disease which may require bowel resection and reanastomosis and, unlikely, formation of an ostomy.  The risks of surgery were discussed in detail and she understands these to include infection; wound separation; hernia; vaginal cuff separation, injury to adjacent organs such as bowel, bladder, blood vessels, ureters and nerves; bleeding which may require blood transfusion; anesthesia risk; thromboembolic events; possible death; unforeseen complications; possible need for re-exploration; medical complications such as heart attack, stroke, pleural effusion and pneumonia; and, if full lymphadenectomy is performed the risk of lymphedema and lymphocyst. The patient will receive DVT  and antibiotic prophylaxis as indicated. She voiced a  clear understanding. She had the opportunity to ask questions. Perioperative instructions were reviewed with her. Prescriptions for post-op medications were sent to her pharmacy of choice.  From a mobility standpoint, the patient needs to have her left leg supported but it is mobile otherwise.  A copy of this note was sent to the patient's referring provider.   50 minutes of total time was spent for this patient encounter, including preparation, face-to-face counseling with the patient and coordination of care, and documentation of the encounter.   Jeral Pinch, MD  Division of Gynecologic Oncology  Department of Obstetrics and Gynecology  [facility]  ___________________________________________  Chief Complaint: Chief Complaint  Patient presents with  . Endometrial cancer ([place])    Follow up    History of Present Illness:  [name] is a 69 y.o. y.o. female who is seen in consultation at the request of Dr. Nelda Marseille for an evaluation of endometrial cancer.  The patient presented with postmenopausal bleeding starting at the beginning of February.  She was seen on 2/24 and underwent endometrial biopsy revealing endometrioid adenocarcinoma.  Reports daily bleeding for the last 33 days including since her biopsy.  She endorses mild cramping as well as intermittent passage of clots.  She has been wearing an always pad which she changes 4 times a day.  She states that this is not saturated when she changes it.  Overall, she reports some weight gain in the last several months.  She denies any nausea or emesis.  She denies early satiety but reports some abdominal bloating.  Endorses regular bowel and bladder function.  Her surgical history is notable for 2 prior C-sections and exploratory laparotomy due to a bowel obstruction secondary to adhesions from prior surgery.  Her knowledge, no bowel resection was  required during the surgery.  Patient denies any shortness of breath or chest pain with ambulation.  She has asthma that she states is well controlled.  She recently had her yearly physical.  Her family history is notable for prostate, stomach, colon and possible GYN cancers in multiple family members on her mother side.  Her mother and 2 of her 3 sisters all had hysterectomies which the patient thinks were for cancer although is unsure what type.  PAST MEDICAL HISTORY:  Past Medical History:  Diagnosis Date  . Allergic rhinitis   . Arthritis   . Asthma   . GERD (gastroesophageal reflux disease)   . Hyperlipemia   . Polio   . Unspecified essential hypertension    clearance Dr Schuyler Amor with note on chart     PAST SURGICAL HISTORY:  Past Surgical History:  Procedure Laterality Date  . BREAST BIOPSY Bilateral    benign  . BREAST SURGERY     bilateral fibroid tumors removed  . CARPAL TUNNEL RELEASE     right  . CESAREAN SECTION     x 2  . COLON SURGERY     scar tissue removed  . leg surgery     bilateral, numerous  . tooth implant     titanium/ front upper right  . TOTAL HIP ARTHROPLASTY     right  . TOTAL HIP REVISION  08/15/2011   Procedure: TOTAL HIP REVISION;  Surgeon: Mauri Pole, MD;  Location: [facility];  Service: Orthopedics;  Laterality: Right;  . vocal surgery     cyst removed  . WRIST SURGERY  2009   left    OB/GYN HISTORY:  OB History  Gravida  Para Term Preterm AB Living  2 2          SAB TAB Ectopic Multiple Live Births               # Outcome Date GA Lbr Len/2nd Weight Sex Delivery Anes PTL Lv  2 Para           1 Para             No LMP recorded. Patient is postmenopausal.  Age at menarche: 11  Age at menopause: 44 Hx of HRT: denies Hx of STDs: denies Last pap: 2014 History of abnormal pap smears: no  SCREENING STUDIES:  Last mammogram: 10/2018  Last colonoscopy: 08/2008  MEDICATIONS: Outpatient Encounter Medications as of 03/13/2019   Medication Sig  . albuterol (PROVENTIL) (2.5 MG/3ML) 0.083% nebulizer solution Take 3 mLs (2.5 mg total) by nebulization every 6 (six) hours as needed for wheezing or shortness of breath.  Marland Kitchen alendronate (FOSAMAX) 70 MG tablet Take 70 mg by mouth once a week.  . budesonide-formoterol (SYMBICORT) 160-4.5 MCG/ACT inhaler Inhale 2 puffs into the lungs 2 (two) times daily.  . budesonide-formoterol (SYMBICORT) 80-4.5 MCG/ACT inhaler Inhale 2 puffs into the lungs 2 (two) times daily.  . cholecalciferol (VITAMIN D) 1000 UNITS tablet Take 2,000 Units by mouth every morning.   . Cyanocobalamin (VITAMIN B-12 CR) 1500 MCG TBCR Take 1 tablet by mouth daily. 1000mg  daily  . fluticasone (FLONASE) 50 MCG/ACT nasal spray Place 2 sprays 2 (two) times daily into both nostrils.  Marland Kitchen loratadine (CLARITIN) 10 MG tablet Take 10 mg by mouth daily.  Marland Kitchen losartan-hydrochlorothiazide (HYZAAR) 100-25 MG per tablet Take 1 tablet by mouth daily.  . montelukast (SINGULAIR) 10 MG tablet TAKE ONE TABLET AT BEDTIME.  Marland Kitchen omeprazole (PRILOSEC) 20 MG capsule TAKE 1 CAPSULE EVERY DAY.  Marland Kitchen oxyCODONE (OXY IR/ROXICODONE) 5 MG immediate release tablet Take 1 tablet (5 mg total) by mouth every 4 (four) hours as needed for severe pain. For AFTER surgery, do not take and drive  . senna-docusate (SENOKOT-S) 8.6-50 MG tablet Take 2 tablets by mouth at bedtime. For AFTER surgery, do not take if having diarrhea   No facility-administered encounter medications on file as of 03/13/2019.    ALLERGIES:  Allergies  Allergen Reactions  . Molds & Smuts Cough  . Latex      FAMILY HISTORY:  Family History  Problem Relation Age of Onset  . Kidney cancer Father   . CAD Father   . Melanoma Mother        STAGE 3  . Breast cancer Mother   . Heart attack Mother   . Colon cancer Maternal Grandfather   . Breast cancer Maternal Grandmother        meta to stomach     SOCIAL HISTORY:    Social Connections:   . Frequency of Communication with  Friends and Family: Not on file  . Frequency of Social Gatherings with Friends and Family: Not on file  . Attends Religious Services: Not on file  . Active Member of Clubs or Organizations: Not on file  . Attends Archivist Meetings: Not on file  . Marital Status: Not on file    REVIEW OF SYSTEMS:  Denies appetite changes, fevers, chills, fatigue, unexplained weight changes. Denies hearing loss, neck lumps or masses, mouth sores, ringing in ears or voice changes. Denies cough or wheezing.  Denies shortness of breath. Denies chest pain or palpitations. Denies leg swelling. Denies abdominal distention, pain,  blood in stools, constipation, diarrhea, nausea, vomiting, or early satiety. Denies pain with intercourse, dysuria, frequency, hematuria or incontinence. Denies hot flashes, pelvic pain, vaginal bleeding or vaginal discharge.   Denies joint pain, back pain or muscle pain/cramps. Denies itching, rash, or wounds. Denies dizziness, headaches, numbness or seizures. Denies swollen lymph nodes or glands, denies easy bruising or bleeding. Denies anxiety, depression, confusion, or decreased concentration.  Physical Exam:  Vital Signs for this encounter:  Blood pressure (!) 154/75, pulse 80, temperature 98 F (36.7 C), temperature source Temporal, weight 152 lb 4 oz (69.1 kg), SpO2 98 %. Body mass index is 27.85 kg/m. General: Alert, oriented, no acute distress.  HEENT: Normocephalic, atraumatic. Sclera anicteric.  Chest: Clear to auscultation bilaterally. No wheezes, rhonchi, or rales. Cardiovascular: Regular rate and rhythm, no murmurs, rubs, or gallops.  Abdomen: Normoactive bowel sounds. Soft, nondistended, nontender to palpation. No masses or hepatosplenomegaly appreciated. No palpable fluid wave.  Well-healed Pfannenstiel incision and vertical midline incision. Extremities: Grossly normal range of motion. Warm, well perfused. No edema bilaterally.  Skin: No rashes or  lesions.  Lymphatics: No cervical, supraclavicular, or inguinal adenopathy.  GU:  Normal external female genitalia. No lesions. No discharge or bleeding.             Bladder/urethra:  No lesions or masses, well supported bladder             Vagina: Mildly atrophic vaginal mucosa.  Patient tolerated speculum exam as well as bimanual poorly.             Cervix: Normal appearing, no lesions although only able to visualize anterior cervix given patient's discomfort.             Uterus: Small, somewhat mobile, no parametrial involvement or nodularity although again limited by patient's toleration of the exam.             Adnexa: No masses appreciated.  Rectal: Deferred.  LABORATORY AND RADIOLOGIC DATA:  Outside medical records were reviewed to synthesize the above history, along with the history and physical obtained during the visit.   Lab Results  Component Value Date   WBC 11.6 (H) 02/24/2013   HGB 13.0 02/24/2013   HCT 38.4 02/24/2013   PLT 375 02/24/2013   GLUCOSE 126 (H) 02/24/2013   ALT 33 02/24/2013   AST 32 02/24/2013   NA 137 02/24/2013   K 4.9 02/24/2013   CL 93 (L) 02/24/2013   CREATININE 0.69 02/24/2013   BUN 7 02/24/2013   CO2 29 02/24/2013   INR 0.95 08/03/2011   EMB on 2/24: Gr 1 endometrioid adenocarcinoma  Pelvic ultrasound on 3/2: Uterus measures 7.9 x 5.7 x 3.7 cm with an endometrial lining of 2.9 cm.  Bilateral ovaries normal in size and shape.

## 2019-03-12 DIAGNOSIS — J301 Allergic rhinitis due to pollen: Secondary | ICD-10-CM | POA: Diagnosis not present

## 2019-03-13 ENCOUNTER — Encounter: Payer: Self-pay | Admitting: Gynecologic Oncology

## 2019-03-13 ENCOUNTER — Inpatient Hospital Stay: Payer: Medicare Other | Attending: Gynecologic Oncology | Admitting: Gynecologic Oncology

## 2019-03-13 ENCOUNTER — Other Ambulatory Visit: Payer: Self-pay

## 2019-03-13 VITALS — BP 154/75 | HR 80 | Temp 98.0°F | Wt 152.2 lb

## 2019-03-13 DIAGNOSIS — Z808 Family history of malignant neoplasm of other organs or systems: Secondary | ICD-10-CM | POA: Diagnosis not present

## 2019-03-13 DIAGNOSIS — Z79899 Other long term (current) drug therapy: Secondary | ICD-10-CM | POA: Insufficient documentation

## 2019-03-13 DIAGNOSIS — Z8051 Family history of malignant neoplasm of kidney: Secondary | ICD-10-CM | POA: Insufficient documentation

## 2019-03-13 DIAGNOSIS — E785 Hyperlipidemia, unspecified: Secondary | ICD-10-CM | POA: Diagnosis not present

## 2019-03-13 DIAGNOSIS — I10 Essential (primary) hypertension: Secondary | ICD-10-CM | POA: Diagnosis not present

## 2019-03-13 DIAGNOSIS — Z8249 Family history of ischemic heart disease and other diseases of the circulatory system: Secondary | ICD-10-CM | POA: Diagnosis not present

## 2019-03-13 DIAGNOSIS — Z803 Family history of malignant neoplasm of breast: Secondary | ICD-10-CM | POA: Diagnosis not present

## 2019-03-13 DIAGNOSIS — J45909 Unspecified asthma, uncomplicated: Secondary | ICD-10-CM | POA: Insufficient documentation

## 2019-03-13 DIAGNOSIS — Z7951 Long term (current) use of inhaled steroids: Secondary | ICD-10-CM | POA: Insufficient documentation

## 2019-03-13 DIAGNOSIS — Z800 Family history of malignant neoplasm of digestive organs: Secondary | ICD-10-CM | POA: Insufficient documentation

## 2019-03-13 DIAGNOSIS — C541 Malignant neoplasm of endometrium: Secondary | ICD-10-CM | POA: Diagnosis not present

## 2019-03-13 MED ORDER — SENNOSIDES-DOCUSATE SODIUM 8.6-50 MG PO TABS: 2.0000 | ORAL_TABLET | Freq: Every day | ORAL | 1 refills | Status: DC

## 2019-03-13 MED ORDER — OXYCODONE HCL 5 MG PO TABS: 5.0000 mg | ORAL_TABLET | ORAL | 0 refills | Status: DC | PRN

## 2019-03-13 MED ORDER — LEVONORGESTREL 20 MCG/24HR IU IUD: 1.0000 | INTRAUTERINE_SYSTEM | Freq: Once | INTRAUTERINE | 0 refills | Status: DC

## 2019-03-13 NOTE — Patient Instructions (Signed)
Preparing for your Surgery  Plan for surgery on March 20, 2019 with Dr. Jeral Pinch at [facility]. You will be scheduled for a robotic assisted total laparoscopic hysterectomy, bilateral salpingo-oophorectomy, sentinel lymph node biopsy, possible laparotomy, possible lymph node dissection, possible dilation and curettage with Mirena IUD placement.  We will sent the prescription for the Mirena IUD to our outpatient pharmacy and we will let you know if there is a copay.   Pre-operative Testing -You will receive a phone call from presurgical testing at [facility] to arrange for a pre-op appointment over the phone, lab appointment, and COVID testing.  -Bring your insurance card, copy of an advanced directive if applicable, medication list  -At that visit, you will be asked to sign a consent for a possible blood transfusion in case a transfusion becomes necessary during surgery.  The need for a blood transfusion is rare but having consent is a necessary part of your care.     -You should not be taking blood thinners or aspirin at least ten days prior to surgery unless instructed by your surgeon.  -Do not take supplements such as fish oil (omega 3), red yeast rice, tumeric before your surgery.   Day Before Surgery at [place] will be asked to take in a light diet the day before surgery.  Avoid carbonated beverages.  You will be advised to have nothing to eat or drink after midnight the evening before.    Eat a light diet the day before surgery.  Examples including soups, broths, toast, yogurt, mashed potatoes.  Things to avoid include carbonated beverages (fizzy beverages), raw fruits and raw vegetables, or beans.   If your bowels are filled with gas, your surgeon will have difficulty visualizing your pelvic organs which increases your surgical risks.  Your role in recovery Your role is to become active as soon as directed by your doctor, while still giving  yourself time to heal.  Rest when you feel tired. You will be asked to do the following in order to speed your recovery:  - Cough and breathe deeply. This helps to clear and expand your lungs and can prevent pneumonia after surgery.  - [place]. Do mild physical activity. Walking or moving your legs help your circulation and body functions return to normal. Do not try to get up or walk alone the first time after surgery.   -If you develop swelling on one leg or the other, pain in the back of your leg, redness/warmth in one of your legs, please call the office or go to the Emergency Room to have a doppler to rule out a blood clot. For shortness of breath, chest pain-seek care in the Emergency Room as soon as possible. - Actively manage your pain. Managing your pain lets you move in comfort. We will ask you to rate your pain on a scale of zero to 10. It is your responsibility to tell your doctor or nurse where and how much you hurt so your pain can be treated.  Special Considerations -Your final pathology results from surgery should be available around one week after surgery and the results will be relayed to you when available.  -FMLA forms can be faxed to [phone] and please allow 5-7 business days for completion.  Pain Management After Surgery -You have been prescribed your pain medication and bowel regimen medications before surgery so that you can have these available when you are discharged from  the hospital. The pain medication is for use ONLY AFTER surgery and a new prescription will not be given.   -Make sure that you have Tylenol and Ibuprofen at home to use on a regular basis after surgery for pain control. We recommend alternating the medications every hour to six hours since they work differently and are processed in the body differently for pain relief.  -Review the attached handout on narcotic use and their risks and side effects.   Bowel Regimen -You have  been prescribed Sennakot-S to take nightly to prevent constipation especially if you are taking the narcotic pain medication intermittently.  It is important to prevent constipation and drink adequate amounts of liquids. You can stop taking this medication when you are not taking pain medication and you are back on your normal bowel routine.   Blood Transfusion Information (For the consent to be signed before surgery)  We will be checking your blood type before surgery so in case of emergencies, we will know what type of blood you would need.                                            WHAT IS A BLOOD TRANSFUSION?  A transfusion is the replacement of blood or some of its parts. Blood is made up of multiple cells which provide different functions.  Red blood cells carry oxygen and are used for blood loss replacement.  White blood cells fight against infection.  Platelets control bleeding.  Plasma helps clot blood.  Other blood products are available for specialized needs, such as hemophilia or other clotting disorders. BEFORE THE TRANSFUSION  Who gives blood for transfusions?   You may be able to donate blood to be used at a later date on yourself (autologous donation).  Relatives can be asked to donate blood. This is generally not any safer than if you have received blood from a stranger. The same precautions are taken to ensure safety when a relative's blood is donated.  Healthy volunteers who are fully evaluated to make sure their blood is safe. This is blood bank blood. Transfusion therapy is the safest it has ever been in the practice of medicine. Before blood is taken from a donor, a complete history is taken to make sure that person has no history of diseases nor engages in risky social behavior (examples are intravenous drug use or sexual activity with multiple partners). The donor's travel history is screened to minimize risk of transmitting infections, such as malaria. The donated  blood is tested for signs of infectious diseases, such as HIV and hepatitis. The blood is then tested to be sure it is compatible with you in order to minimize the chance of a transfusion reaction. If you or a relative donates blood, this is often done in anticipation of surgery and is not appropriate for emergency situations. It takes many days to process the donated blood. RISKS AND COMPLICATIONS Although transfusion therapy is very safe and saves many lives, the main dangers of transfusion include:   Getting an infectious disease.  Developing a transfusion reaction. This is an allergic reaction to something in the blood you were given. Every precaution is taken to prevent this. The decision to have a blood transfusion has been considered carefully by your caregiver before blood is given. Blood is not given unless the benefits outweigh the risks.  AFTER SURGERY  INSTRUCTIONS  Return to work: 4-6 weeks if applicable  Activity: 1. Be up and out of the bed during the day.  Take a nap if needed.  You may walk up steps but be careful and use the hand rail.  Stair climbing will tire you more than you think, you may need to stop part way and rest.   2. No lifting or straining for 6 weeks over 10 pounds. No pushing, pulling, straining for 6 weeks.  3. No driving for 1 week minimum.  Do not drive if you are taking narcotic pain medicine.   4. You can shower as soon as the next day after surgery. Shower daily.  Use soap and water on your incision and pat dry; don't rub.  No tub baths or submerging your body in water until cleared by your surgeon. If you have the soap that was given to you by pre-surgical testing that was used before surgery, you do not need to use it afterwards because this can irritate your incisions.   5. No sexual activity and nothing in the vagina for 8 weeks.  6. You may experience a small amount of clear drainage from your incisions, which is normal.  If the drainage persists,  increases, or changes color please call the office.  7. Do not use creams, lotions, or ointments such as neosporin on your incisions after surgery until advised by your surgeon because they can cause removal of the dermabond glue on your incisions.    8. You may experience vaginal spotting after surgery or around the 6-8 week mark from surgery when the stitches at the top of the vagina begin to dissolve.  The spotting is normal but if you experience heavy bleeding, call our office.  9. Take Tylenol or ibuprofen first for pain and only use narcotic pain medication for severe pain not relieved by the Tylenol or Ibuprofen.  Monitor your Tylenol intake to a max of 4,000 mg.  Diet: 1. Low sodium Heart Healthy Diet is recommended.  2. It is safe to use a laxative, such as Miralax or Colace, if you have difficulty moving your bowels. You can take Sennakot at bedtime every evening to keep bowel movements regular and to prevent constipation.    Wound Care: 1. Keep clean and dry.  Shower daily.  Reasons to call the Doctor:  Fever - Oral temperature greater than 100.4 degrees Fahrenheit  Foul-smelling vaginal discharge  Difficulty urinating  Nausea and vomiting  Increased pain at the site of the incision that is unrelieved with pain medicine.  Difficulty breathing with or without chest pain  New calf pain especially if only on one side  Sudden, continuing increased vaginal bleeding with or without clots.   Contacts: For questions or concerns you should contact:  Dr. Jeral Pinch at [phone]  Joylene John, NP at [phone]  After Hours: call [phone] and have the GYN Oncologist paged/contacted

## 2019-03-14 ENCOUNTER — Telehealth: Payer: Self-pay | Admitting: *Deleted

## 2019-03-14 ENCOUNTER — Other Ambulatory Visit: Payer: Self-pay | Admitting: Gynecologic Oncology

## 2019-03-14 ENCOUNTER — Telehealth: Payer: Self-pay | Admitting: Gynecologic Oncology

## 2019-03-14 DIAGNOSIS — C541 Malignant neoplasm of endometrium: Secondary | ICD-10-CM

## 2019-03-14 MED FILL — MIRENA SYSTEM: 20 | 90 days supply | Qty: 1 | Fill #0

## 2019-03-14 NOTE — Telephone Encounter (Signed)
Patient returned call to the office.  Informed her about the situation with the IUD and her insurance saying it is not on formulary. She said she would pay for the IUD at the $393 price through the [facility] with the hopes that it will not be used and it can be returned. Our office will call her back with the information for contacting the outpatient pharmacy to pay for the IUD over the phone. No concerns voiced.

## 2019-03-14 NOTE — Telephone Encounter (Signed)
Faxed medical clearance form and office noted to Dr Maurice Small

## 2019-03-14 NOTE — Telephone Encounter (Signed)
Called to update the patient about the Mirena IUD for possible use in the OR.  Left message asking her to please call the office to discuss.  Per Doristine Mango, the Mirena IUD is not on formulary.  The cost to the patient would be around $394 and if it was not used during the surgery it could be returned for a refund.

## 2019-03-14 NOTE — Telephone Encounter (Signed)
Left a message for the patient with the contact information for [facility] at the [place] at [phone].  Advised patient to contact [facility] to pay for the IUD prior to her procedure.  Patient advised to call the office for any questions or concerns.

## 2019-03-15 ENCOUNTER — Encounter (HOSPITAL_COMMUNITY): Payer: Self-pay

## 2019-03-15 ENCOUNTER — Other Ambulatory Visit: Payer: Self-pay

## 2019-03-15 ENCOUNTER — Inpatient Hospital Stay (HOSPITAL_COMMUNITY)
Admission: RE | Admit: 2019-03-15 | Discharge: 2019-03-15 | Disposition: A | Discharge status: Home / Self Care | Payer: Medicare Other | Source: Ambulatory Visit

## 2019-03-15 DIAGNOSIS — J301 Allergic rhinitis due to pollen: Secondary | ICD-10-CM | POA: Diagnosis not present

## 2019-03-15 DIAGNOSIS — J3089 Other allergic rhinitis: Secondary | ICD-10-CM | POA: Diagnosis not present

## 2019-03-15 HISTORY — DX: Malignant neoplasm of endometrium: C54.1

## 2019-03-15 HISTORY — DX: Carpal tunnel syndrome, right upper limb: G56.01

## 2019-03-15 HISTORY — DX: Postmenopausal bleeding: N95.0

## 2019-03-15 NOTE — Patient Instructions (Signed)
DUE TO COVID-19 ONLY ONE VISITOR IS ALLOWED IN WAITING ROOM (VISITOR WILL HAVE A TEMPERATURE CHECK ON ARRIVAL AND MUST WEAR A FACE MASK THE ENTIRE TIME.)  ONCE YOU ARE ADMITTED TO YOUR PRIVATE ROOM, THE SAME ONE VISITOR IS ALLOWED TO VISIT DURING VISITING HOURS ONLY.  Your COVID swab testing is scheduled for Saturday, March 16, 2019 at 9:20 AM , You must self quarantine after your testing per handout given to you at the testing site.  ([facility] up testing enter pre-surgical testing line)   Your procedure is scheduled on: Wednesday, March 20, 2019  Report to [facility] AT  10:15 A. M.   Call this number if you have problems the morning of surgery: [phone].   OUR [address]ESS IS [address].  WE ARE LOCATED IN THE [place]                                   [facility].                                     REMEMBER:  DO NOT EAT FOOD AFTER MIDNIGHT .    MAY HAVE LIQUIDS UNTIL 9:15 AM DAY OF SURGERY  CLEAR LIQUID DIET  Foods Allowed                                                                     Foods Excluded  Water, Black Coffee and tea, regular and decaf                             liquids that you cannot  Plain Jell-O in any flavor  (No red)                                           see through such as: Fruit ices (not with fruit pulp)                                     milk, soups, orange juice  Iced Popsicles (No red)                                    All solid food Carbonated beverages, regular and diet                                    Apple juices Sports drinks like Gatorade (No red) Lightly seasoned clear broth or consume(fat free) Sugar, honey syrup  Sample Menu Breakfast                                Lunch  Supper Cranberry juice                    Beef broth                            Chicken broth Jell-O                                     Grape  juice                           Apple juice Coffee or tea                        Jell-O                                      Popsicle                                                Coffee or tea                        Coffee or tea  BRUSH YOUR TEETH THE MORNING OF SURGERY.  TAKE THESE MEDICATIONS MORNING OF SURGERY WITH A SIP OF WATER:  LORATADINE, OMPERAZOLE  MAY USE SYMBICORT AND FLONASE DAY OF SURGERY  DO NOT WEAR JEWERLY, MAKE UP, OR NAIL POLISH.  DO NOT WEAR LOTIONS, POWDERS, PERFUMES/COLOGNE OR DEODORANT.  DO NOT SHAVE FOR 24 HOURS PRIOR TO DAY OF SURGERY.  CONTACTS, GLASSES, OR DENTURES MAY NOT BE WORN TO SURGERY.                                    [facility] IS NOT RESPONSIBLE  FOR ANY BELONGINGS.          BRING ALL PRESCRIPTION MEDICATIONS WITH YOU THE DAY OF SURGERY IN ORIGINAL CONTAINERS                                                               [facility] - Preparing for Surgery Before surgery, you can play an important role.  Because skin is not sterile, your skin needs to be as free of germs as possible.  You can reduce the number of germs on your skin by washing with CHG (chlorahexidine gluconate) soap before surgery.  CHG is an antiseptic cleaner which kills germs and bonds with the skin to continue killing germs even after washing. Please DO NOT use if you have an allergy to CHG or antibacterial soaps.  If your skin becomes reddened/irritated stop using the CHG and inform your nurse when you arrive at Short Stay. Do not shave (including legs and underarms) for at least 48 hours prior to the first CHG shower.  You may shave your face/neck.  Please follow these instructions carefully:  1.  Shower with CHG Soap the night before surgery and the  morning of surgery.  2.  If you choose to wash your hair, wash your hair first as usual with your normal  shampoo.  3.  After you shampoo, rinse your hair and body thoroughly to remove the shampoo.                              4.  Use CHG as you would any other liquid soap.  You can apply chg directly to the skin and wash.  Gently with a scrungie or clean washcloth.  5.  Apply the CHG Soap to your body ONLY FROM THE NECK DOWN.   Do   not use on face/ open                           Wound or open sores. Avoid contact with eyes, ears mouth and   genitals (private parts).                       Wash face,  Genitals (private parts) with your normal soap.             6.  Wash thoroughly, paying special attention to the area where your    surgery  will be performed.  7.  Thoroughly rinse your body with warm water from the neck down.  8.  DO NOT shower/wash with your normal soap after using and rinsing off the CHG Soap.                9.  Pat yourself dry with a clean towel.            10.  Wear clean pajamas.            11.  Place clean sheets on your bed the night of your first shower and do not  sleep with pets. Day of Surgery : Do not apply any lotions/deodorants the morning of surgery.  Please wear clean clothes to the hospital/surgery center.  FAILURE TO FOLLOW THESE INSTRUCTIONS MAY RESULT IN THE CANCELLATION OF YOUR SURGERY  PATIENT SIGNATURE_________________________________  NURSE SIGNATURE__________________________________  ________________________________________________________________________   Adam Phenix  An incentive spirometer is a tool that can help keep your lungs clear and active. This tool measures how well you are filling your lungs with each breath. Taking long deep breaths may help reverse or decrease the chance of developing breathing (pulmonary) problems (especially infection) following:  A long period of time when you are unable to move or be active. BEFORE THE PROCEDURE   If the spirometer includes an indicator to show your best effort, your nurse or respiratory therapist will set it to a desired goal.  If possible, sit up straight or lean slightly forward. Try not to  slouch.  Hold the incentive spirometer in an upright position. INSTRUCTIONS FOR USE  1. Sit on the edge of your bed if possible, or sit up as far as you can in bed or on a chair. 2. Hold the incentive spirometer in an upright position. 3. Breathe out normally. 4. Place the mouthpiece in your mouth and seal your lips tightly around it. 5. Breathe in slowly and as deeply as possible, raising the piston or the ball toward the top of the column. 6. Hold your breath for 3-5 seconds or for as long as possible. Allow  the piston or ball to fall to the bottom of the column. 7. Remove the mouthpiece from your mouth and breathe out normally. 8. Rest for a few seconds and repeat Steps 1 through 7 at least 10 times every 1-2 hours when you are awake. Take your time and take a few normal breaths between deep breaths. 9. The spirometer may include an indicator to show your best effort. Use the indicator as a goal to work toward during each repetition. 10. After each set of 10 deep breaths, practice coughing to be sure your lungs are clear. If you have an incision (the cut made at the time of surgery), support your incision when coughing by placing a pillow or rolled up towels firmly against it. Once you are able to get out of bed, walk around indoors and cough well. You may stop using the incentive spirometer when instructed by your caregiver.  RISKS AND COMPLICATIONS  Take your time so you do not get dizzy or light-headed.  If you are in pain, you may need to take or ask for pain medication before doing incentive spirometry. It is harder to take a deep breath if you are having pain. AFTER USE  Rest and breathe slowly and easily.  It can be helpful to keep track of a log of your progress. Your caregiver can provide you with a simple table to help with this. If you are using the spirometer at home, follow these instructions: [place] IF:   You are having difficultly using the spirometer.  You  have trouble using the spirometer as often as instructed.  Your pain medication is not giving enough relief while using the spirometer.  You develop fever of 100.5 F (38.1 C) or higher. SEEK IMMEDIATE MEDICAL CARE IF:   You cough up bloody sputum that had not been present before.  You develop fever of 102 F (38.9 C) or greater.  You develop worsening pain at or near the incision site. MAKE SURE YOU:   Understand these instructions.  Will watch your condition.  Will get help right away if you are not doing well or get worse. Document Released: 05/02/2006 Document Revised: 03/14/2011 Document Reviewed: 07/03/2006 Exit[employer] Patient Information 2014 Exit[employer], [place].   ________________________________________________________________________  WHAT IS A BLOOD TRANSFUSION? Blood Transfusion Information  A transfusion is the replacement of blood or some of its parts. Blood is made up of multiple cells which provide different functions.  Red blood cells carry oxygen and are used for blood loss replacement.  White blood cells fight against infection.  Platelets control bleeding.  Plasma helps clot blood.  Other blood products are available for specialized needs, such as hemophilia or other clotting disorders. BEFORE THE TRANSFUSION  Who gives blood for transfusions?   Healthy volunteers who are fully evaluated to make sure their blood is safe. This is blood bank blood. Transfusion therapy is the safest it has ever been in the practice of medicine. Before blood is taken from a donor, a complete history is taken to make sure that person has no history of diseases nor engages in risky social behavior (examples are intravenous drug use or sexual activity with multiple partners). The donor's travel history is screened to minimize risk of transmitting infections, such as malaria. The donated blood is tested for signs of infectious diseases, such as HIV and hepatitis. The blood is then  tested to be sure it is compatible with you in order to minimize the chance of a transfusion reaction.  If you or a relative donates blood, this is often done in anticipation of surgery and is not appropriate for emergency situations. It takes many days to process the donated blood. RISKS AND COMPLICATIONS Although transfusion therapy is very safe and saves many lives, the main dangers of transfusion include:   Getting an infectious disease.  Developing a transfusion reaction. This is an allergic reaction to something in the blood you were given. Every precaution is taken to prevent this. The decision to have a blood transfusion has been considered carefully by your caregiver before blood is given. Blood is not given unless the benefits outweigh the risks. AFTER THE TRANSFUSION  Right after receiving a blood transfusion, you will usually feel much better and more energetic. This is especially true if your red blood cells have gotten low (anemic). The transfusion raises the level of the red blood cells which carry oxygen, and this usually causes an energy increase.  The nurse administering the transfusion will monitor you carefully for complications. HOME CARE INSTRUCTIONS  No special instructions are needed after a transfusion. You may find your energy is better. Speak with your caregiver about any limitations on activity for underlying diseases you may have. SEEK MEDICAL CARE IF:   Your condition is not improving after your transfusion.  You develop redness or irritation at the intravenous (IV) site. SEEK IMMEDIATE MEDICAL CARE IF:  Any of the following symptoms occur over the next 12 hours:  Shaking chills.  You have a temperature by mouth above 102 F (38.9 C), not controlled by medicine.  Chest, back, or muscle pain.  People around you feel you are not acting correctly or are confused.  Shortness of breath or difficulty breathing.  Dizziness and fainting.  You get a rash or  develop hives.  You have a decrease in urine output.  Your urine turns a dark color or changes to pink, red, or brown. Any of the following symptoms occur over the next 10 days:  You have a temperature by mouth above 102 F (38.9 C), not controlled by medicine.  Shortness of breath.  Weakness after normal activity.  The white part of the eye turns yellow (jaundice).  You have a decrease in the amount of urine or are urinating less often.  Your urine turns a dark color or changes to pink, red, or brown. Document Released: 12/18/1999 Document Revised: 03/14/2011 Document Reviewed: 08/06/2007 [employer] Patient Information 2014 [place], [place].  _______________________________________________________________________

## 2019-03-15 NOTE — Progress Notes (Signed)
PCP - Dr. Maurice Small Cardiologist - N/A  Chest x-ray - greater than 1 year EKG - 03/18/19 in epic Stress Test - N/A ECHO - N/A Cardiac Cath - N/A  Sleep Study - N/A CPAP - N/A  Fasting Blood Sugar - N/A Checks Blood Sugar __N/A___ times a day  Blood Thinner Instructions: N/A Aspirin Instructions: N/A Last Dose: N/A  Anesthesia review:  N/A  Patient denies shortness of breath, fever, cough and chest pain at PAT appointment   Patient verbalized understanding of instructions that were given to them at the PAT appointment. Patient was also instructed that they will need to review over the PAT instructions again at home before surgery.

## 2019-03-16 ENCOUNTER — Other Ambulatory Visit (HOSPITAL_COMMUNITY)
Admission: RE | Admit: 2019-03-16 | Discharge: 2019-03-16 | Disposition: A | Discharge status: Home / Self Care | Payer: Medicare Other | Source: Ambulatory Visit | Attending: Gynecologic Oncology | Admitting: Gynecologic Oncology

## 2019-03-16 DIAGNOSIS — Z20822 Contact with and (suspected) exposure to covid-19: Secondary | ICD-10-CM | POA: Diagnosis not present

## 2019-03-16 DIAGNOSIS — Z01812 Encounter for preprocedural laboratory examination: Secondary | ICD-10-CM | POA: Insufficient documentation

## 2019-03-16 LAB — SARS CORONAVIRUS 2 (TAT 6-24 HRS): SARS Coronavirus 2: NEGATIVE

## 2019-03-18 ENCOUNTER — Telehealth: Payer: Self-pay

## 2019-03-18 ENCOUNTER — Encounter (HOSPITAL_COMMUNITY)
Admission: RE | Admit: 2019-03-18 | Discharge: 2019-03-18 | Disposition: A | Discharge status: Home / Self Care | Payer: Medicare Other | Source: Ambulatory Visit | Attending: Gynecologic Oncology | Admitting: Gynecologic Oncology

## 2019-03-18 ENCOUNTER — Other Ambulatory Visit: Payer: Self-pay

## 2019-03-18 DIAGNOSIS — Z01818 Encounter for other preprocedural examination: Secondary | ICD-10-CM | POA: Diagnosis not present

## 2019-03-18 LAB — COMPREHENSIVE METABOLIC PANEL
ALT: 21 U/L (ref 0–44)
AST: 21 U/L (ref 15–41)
Albumin: 3.9 g/dL (ref 3.5–5.0)
Alkaline Phosphatase: 67 U/L (ref 38–126)
Anion gap: 10 (ref 5–15)
BUN: 15 mg/dL (ref 8–23)
CO2: 29 mmol/L (ref 22–32)
Calcium: 9.2 mg/dL (ref 8.9–10.3)
Chloride: 102 mmol/L (ref 98–111)
Creatinine, Ser: 0.67 mg/dL (ref 0.44–1.00)
GFR calc Af Amer: >60 mL/min (ref >60)
GFR calc non Af Amer: >60 mL/min (ref >60)
Glucose, Bld: 89 mg/dL (ref 70–99)
Potassium: 3.3 mmol/L — ABNORMAL LOW (ref 3.5–5.1)
Sodium: 141 mmol/L (ref 135–145)
Total Bilirubin: 0.7 mg/dL (ref 0.3–1.2)
Total Protein: 7 g/dL (ref 6.5–8.1)

## 2019-03-18 LAB — CBC
HCT: 43.3 % (ref 36.0–46.0)
Hemoglobin: 14.7 g/dL (ref 12.0–15.0)
MCH: 32.6 pg (ref 26.0–34.0)
MCHC: 33.9 g/dL (ref 30.0–36.0)
MCV: 96 fL (ref 80.0–100.0)
Platelets: 371 10*3/uL (ref 150–400)
RBC: 4.51 MIL/uL (ref 3.87–5.11)
RDW: 13.2 % (ref 11.5–15.5)
WBC: 6.5 10*3/uL (ref 4.0–10.5)
nRBC: 0 % (ref 0.0–0.2)

## 2019-03-18 LAB — URINALYSIS, ROUTINE W REFLEX MICROSCOPIC
Bacteria, UA: NONE SEEN
Bilirubin Urine: NEGATIVE
Glucose, UA: NEGATIVE mg/dL
Ketones, ur: NEGATIVE mg/dL
Nitrite: NEGATIVE
Protein, ur: NEGATIVE mg/dL
Specific Gravity, Urine: 1.016 (ref 1.005–1.030)
pH: 7 (ref 5.0–8.0)

## 2019-03-18 NOTE — Progress Notes (Signed)
Surgical clearance received and placed in chart.

## 2019-03-18 NOTE — Telephone Encounter (Addendum)
LM for [name] that her potassium was slightly low on her pre op labs today. It was 3.3.  Melissa Cross,NP recommends that she increase potassium rich foods in her diet such as OJ, banana, cantaloupe, and skins of baked  potatoes.

## 2019-03-19 DIAGNOSIS — J301 Allergic rhinitis due to pollen: Secondary | ICD-10-CM | POA: Diagnosis not present

## 2019-03-19 DIAGNOSIS — J3089 Other allergic rhinitis: Secondary | ICD-10-CM | POA: Diagnosis not present

## 2019-03-19 NOTE — Telephone Encounter (Signed)
[name] states that she understands her pre op instructions. No questions or concerns at this time. She did receive the message regarding increasing potassium rich foods in her diet and is taking in bananas etc.

## 2019-03-19 NOTE — Anesthesia Preprocedure Evaluation (Addendum)
Anesthesia Evaluation  Patient identified by MRN, date of birth, ID band Patient awake    Reviewed: Allergy & Precautions, NPO status , Patient's Chart, lab work & pertinent test results  Airway Mallampati: II  TM Distance: >3 FB Neck ROM: Full    Dental no notable dental hx. (+) Teeth Intact, Dental Advisory Given   Pulmonary asthma ,    Pulmonary exam normal breath sounds clear to auscultation       Cardiovascular hypertension, Pt. on medications Normal cardiovascular exam Rhythm:Regular Rate:Normal     Neuro/Psych negative neurological ROS  negative psych ROS   GI/Hepatic Neg liver ROS, GERD  ,  Endo/Other  negative endocrine ROS  Renal/GU K+ 3.3 Cr 0.67     Musculoskeletal  (+) Arthritis ,   Abdominal   Peds  Hematology Hgb 14.7 plt 371 T&S avail  Apos   Anesthesia Other Findings   Reproductive/Obstetrics                          Anesthesia Physical Anesthesia Plan  ASA: II  Anesthesia Plan: General   Post-op Pain Management:  Regional for Post-op pain   Induction: Intravenous  PONV Risk Score and Plan: 3 and Treatment may vary due to age or medical condition, Ondansetron and Dexamethasone  Airway Management Planned: Oral ETT  Additional Equipment: None  Intra-op Plan:   Post-operative Plan: Extubation in OR  Informed Consent: I have reviewed the patients History and Physical, chart, labs and discussed the procedure including the risks, benefits and alternatives for the proposed anesthesia with the patient or authorized representative who has indicated his/her understanding and acceptance.     Dental advisory given  Plan Discussed with:   Anesthesia Plan Comments: (Robot assisted total hysterectomy for endometiral CA under GA w Lidocaine infusion +/- dexmetomidine 0.3 mcg/kg)       Anesthesia Quick Evaluation

## 2019-03-20 ENCOUNTER — Encounter (HOSPITAL_BASED_OUTPATIENT_CLINIC_OR_DEPARTMENT_OTHER)
Admission: RE | Disposition: A | Discharge status: Home / Self Care | Payer: Self-pay | Source: Ambulatory Visit | Attending: Gynecologic Oncology

## 2019-03-20 ENCOUNTER — Ambulatory Visit (HOSPITAL_BASED_OUTPATIENT_CLINIC_OR_DEPARTMENT_OTHER): Payer: Medicare Other | Admitting: Anesthesiology

## 2019-03-20 ENCOUNTER — Other Ambulatory Visit: Payer: Self-pay

## 2019-03-20 ENCOUNTER — Encounter (HOSPITAL_BASED_OUTPATIENT_CLINIC_OR_DEPARTMENT_OTHER): Payer: Self-pay | Admitting: Gynecologic Oncology

## 2019-03-20 ENCOUNTER — Ambulatory Visit (HOSPITAL_BASED_OUTPATIENT_CLINIC_OR_DEPARTMENT_OTHER)
Admission: RE | Admit: 2019-03-20 | Discharge: 2019-03-20 | Disposition: A | Discharge status: Home / Self Care | Payer: Medicare Other | Source: Ambulatory Visit | Attending: Gynecologic Oncology | Admitting: Gynecologic Oncology

## 2019-03-20 DIAGNOSIS — C542 Malignant neoplasm of myometrium: Secondary | ICD-10-CM | POA: Diagnosis not present

## 2019-03-20 DIAGNOSIS — Z8042 Family history of malignant neoplasm of prostate: Secondary | ICD-10-CM | POA: Insufficient documentation

## 2019-03-20 DIAGNOSIS — Z803 Family history of malignant neoplasm of breast: Secondary | ICD-10-CM | POA: Diagnosis not present

## 2019-03-20 DIAGNOSIS — M1990 Unspecified osteoarthritis, unspecified site: Secondary | ICD-10-CM | POA: Insufficient documentation

## 2019-03-20 DIAGNOSIS — I10 Essential (primary) hypertension: Secondary | ICD-10-CM | POA: Diagnosis not present

## 2019-03-20 DIAGNOSIS — Z7951 Long term (current) use of inhaled steroids: Secondary | ICD-10-CM | POA: Insufficient documentation

## 2019-03-20 DIAGNOSIS — Z79899 Other long term (current) drug therapy: Secondary | ICD-10-CM | POA: Diagnosis not present

## 2019-03-20 DIAGNOSIS — C541 Malignant neoplasm of endometrium: Secondary | ICD-10-CM | POA: Diagnosis present

## 2019-03-20 DIAGNOSIS — Z8612 Personal history of poliomyelitis: Secondary | ICD-10-CM | POA: Insufficient documentation

## 2019-03-20 DIAGNOSIS — Z96641 Presence of right artificial hip joint: Secondary | ICD-10-CM | POA: Diagnosis not present

## 2019-03-20 DIAGNOSIS — K219 Gastro-esophageal reflux disease without esophagitis: Secondary | ICD-10-CM | POA: Insufficient documentation

## 2019-03-20 DIAGNOSIS — Z8249 Family history of ischemic heart disease and other diseases of the circulatory system: Secondary | ICD-10-CM | POA: Diagnosis not present

## 2019-03-20 DIAGNOSIS — J45909 Unspecified asthma, uncomplicated: Secondary | ICD-10-CM | POA: Diagnosis not present

## 2019-03-20 DIAGNOSIS — N950 Postmenopausal bleeding: Secondary | ICD-10-CM | POA: Diagnosis not present

## 2019-03-20 DIAGNOSIS — Z800 Family history of malignant neoplasm of digestive organs: Secondary | ICD-10-CM | POA: Insufficient documentation

## 2019-03-20 DIAGNOSIS — Z808 Family history of malignant neoplasm of other organs or systems: Secondary | ICD-10-CM | POA: Insufficient documentation

## 2019-03-20 DIAGNOSIS — N736 Female pelvic peritoneal adhesions (postinfective): Secondary | ICD-10-CM | POA: Diagnosis not present

## 2019-03-20 DIAGNOSIS — Z8051 Family history of malignant neoplasm of kidney: Secondary | ICD-10-CM | POA: Insufficient documentation

## 2019-03-20 DIAGNOSIS — I890 Lymphedema, not elsewhere classified: Secondary | ICD-10-CM | POA: Diagnosis not present

## 2019-03-20 DIAGNOSIS — E785 Hyperlipidemia, unspecified: Secondary | ICD-10-CM | POA: Diagnosis not present

## 2019-03-20 HISTORY — PX: LYMPH NODE DISSECTION: SHX5087

## 2019-03-20 HISTORY — PX: ROBOTIC ASSISTED TOTAL HYSTERECTOMY WITH BILATERAL SALPINGO OOPHERECTOMY: SHX6086

## 2019-03-20 HISTORY — PX: SENTINEL NODE BIOPSY: SHX6608

## 2019-03-20 LAB — TYPE AND SCREEN
ABO/RH(D): A POS
Antibody Screen: NEGATIVE

## 2019-03-20 SURGERY — HYSTERECTOMY, TOTAL, ROBOT-ASSISTED, LAPAROSCOPIC, WITH BILATERAL SALPINGO-OOPHORECTOMY
Anesthesia: General | Site: Abdomen

## 2019-03-20 MED ORDER — ONDANSETRON HCL 4 MG PO TABS
4.0000 mg | ORAL_TABLET | Freq: Four times a day (QID) | ORAL | Status: DC | PRN
  Filled 2019-03-20: qty 1

## 2019-03-20 MED ORDER — EPHEDRINE 5 MG/ML INJ
INTRAVENOUS | Status: AC
  Filled 2019-03-20: qty 10

## 2019-03-20 MED ORDER — ACETAMINOPHEN 10 MG/ML IV SOLN
INTRAVENOUS | Status: AC
  Filled 2019-03-20: qty 100

## 2019-03-20 MED ORDER — FENTANYL CITRATE (PF) 250 MCG/5ML IJ SOLN
INTRAMUSCULAR | Status: AC
  Filled 2019-03-20: qty 5

## 2019-03-20 MED ORDER — ACETAMINOPHEN 500 MG PO TABS
1000.0000 mg | ORAL_TABLET | ORAL | Status: AC
  Administered 2019-03-20: 1000 mg via ORAL
  Filled 2019-03-20: qty 2

## 2019-03-20 MED ORDER — ROCURONIUM BROMIDE 10 MG/ML (PF) SYRINGE
PREFILLED_SYRINGE | INTRAVENOUS | Status: DC | PRN
  Administered 2019-03-20: 50 mg via INTRAVENOUS
  Administered 2019-03-20 (×2): 10 mg via INTRAVENOUS

## 2019-03-20 MED ORDER — MIDAZOLAM HCL 2 MG/2ML IJ SOLN
INTRAMUSCULAR | Status: DC | PRN
  Administered 2019-03-20: 1 mg via INTRAVENOUS

## 2019-03-20 MED ORDER — KETOROLAC TROMETHAMINE 30 MG/ML IJ SOLN
INTRAMUSCULAR | Status: DC | PRN
  Administered 2019-03-20: 30 mg via INTRAVENOUS

## 2019-03-20 MED ORDER — GABAPENTIN 100 MG PO CAPS
200.0000 mg | ORAL_CAPSULE | ORAL | Status: AC
  Administered 2019-03-20: 200 mg via ORAL
  Filled 2019-03-20: qty 2

## 2019-03-20 MED ORDER — MIDAZOLAM HCL 2 MG/2ML IJ SOLN
INTRAMUSCULAR | Status: AC
  Filled 2019-03-20: qty 2

## 2019-03-20 MED ORDER — SUGAMMADEX SODIUM 200 MG/2ML IV SOLN
INTRAVENOUS | Status: DC | PRN
  Administered 2019-03-20: 140 mg via INTRAVENOUS

## 2019-03-20 MED ORDER — OXYCODONE HCL 5 MG PO TABS
5.0000 mg | ORAL_TABLET | ORAL | Status: DC | PRN
  Filled 2019-03-20: qty 2

## 2019-03-20 MED ORDER — FENTANYL CITRATE (PF) 100 MCG/2ML IJ SOLN
INTRAMUSCULAR | Status: AC
  Filled 2019-03-20: qty 2

## 2019-03-20 MED ORDER — ACETAMINOPHEN 10 MG/ML IV SOLN
1000.0000 mg | Freq: Once | INTRAVENOUS | Status: DC | PRN
  Filled 2019-03-20: qty 100

## 2019-03-20 MED ORDER — PROMETHAZINE HCL 25 MG/ML IJ SOLN
INTRAMUSCULAR | Status: AC
  Filled 2019-03-20: qty 1

## 2019-03-20 MED ORDER — STERILE WATER FOR INJECTION IJ SOLN
INTRAMUSCULAR | Status: DC | PRN
  Administered 2019-03-20: 10 mL via SURGICAL_CAVITY

## 2019-03-20 MED ORDER — ONDANSETRON HCL 4 MG/2ML IJ SOLN
INTRAMUSCULAR | Status: AC
  Filled 2019-03-20: qty 2

## 2019-03-20 MED ORDER — ACETAMINOPHEN 500 MG PO TABS
ORAL_TABLET | ORAL | Status: AC
  Filled 2019-03-20: qty 2

## 2019-03-20 MED ORDER — LIDOCAINE 2% (20 MG/ML) 5 ML SYRINGE
INTRAMUSCULAR | Status: AC
  Filled 2019-03-20: qty 5

## 2019-03-20 MED ORDER — LIDOCAINE 2% (20 MG/ML) 5 ML SYRINGE
INTRAMUSCULAR | Status: DC | PRN
  Administered 2019-03-20: 1.5 mg/kg/h via INTRAVENOUS

## 2019-03-20 MED ORDER — LIDOCAINE 2% (20 MG/ML) 5 ML SYRINGE
INTRAMUSCULAR | Status: DC | PRN
  Administered 2019-03-20: 100 mg via INTRAVENOUS
  Administered 2019-03-20: 50 mg via INTRAVENOUS

## 2019-03-20 MED ORDER — GABAPENTIN 100 MG PO CAPS
ORAL_CAPSULE | ORAL | Status: AC
  Filled 2019-03-20: qty 2

## 2019-03-20 MED ORDER — FENTANYL CITRATE (PF) 100 MCG/2ML IJ SOLN
INTRAMUSCULAR | Status: DC | PRN
  Administered 2019-03-20: 50 ug via INTRAVENOUS
  Administered 2019-03-20: 75 ug via INTRAVENOUS
  Administered 2019-03-20: 25 ug via INTRAVENOUS

## 2019-03-20 MED ORDER — ONDANSETRON HCL 4 MG/2ML IJ SOLN
4.0000 mg | Freq: Once | INTRAMUSCULAR | Status: DC | PRN
  Filled 2019-03-20: qty 2

## 2019-03-20 MED ORDER — HEPARIN SODIUM (PORCINE) 5000 UNIT/ML IJ SOLN
5000.0000 [IU] | INTRAMUSCULAR | Status: AC
  Administered 2019-03-20: 5000 [IU] via SUBCUTANEOUS
  Filled 2019-03-20: qty 1

## 2019-03-20 MED ORDER — PROMETHAZINE HCL 25 MG/ML IJ SOLN
6.2500 mg | Freq: Once | INTRAMUSCULAR | Status: AC
  Administered 2019-03-20: 6.25 mg via INTRAVENOUS
  Filled 2019-03-20: qty 1

## 2019-03-20 MED ORDER — CEFAZOLIN SODIUM-DEXTROSE 2-4 GM/100ML-% IV SOLN
INTRAVENOUS | Status: AC
  Filled 2019-03-20: qty 100

## 2019-03-20 MED ORDER — DEXAMETHASONE SODIUM PHOSPHATE 10 MG/ML IJ SOLN
INTRAMUSCULAR | Status: AC
  Filled 2019-03-20: qty 1

## 2019-03-20 MED ORDER — LACTATED RINGERS IV SOLN
INTRAVENOUS | Status: DC
  Administered 2019-03-20: 1000 mL via INTRAVENOUS
  Filled 2019-03-20: qty 1000

## 2019-03-20 MED ORDER — PHENYLEPHRINE 40 MCG/ML (10ML) SYRINGE FOR IV PUSH (FOR BLOOD PRESSURE SUPPORT)
PREFILLED_SYRINGE | INTRAVENOUS | Status: AC
  Filled 2019-03-20: qty 10

## 2019-03-20 MED ORDER — HYDROMORPHONE HCL 1 MG/ML IJ SOLN
0.2000 mg | INTRAMUSCULAR | Status: DC | PRN
  Filled 2019-03-20: qty 1

## 2019-03-20 MED ORDER — PROPOFOL 10 MG/ML IV BOLUS
INTRAVENOUS | Status: AC
  Filled 2019-03-20: qty 20

## 2019-03-20 MED ORDER — ONDANSETRON HCL 4 MG/2ML IJ SOLN
4.0000 mg | Freq: Four times a day (QID) | INTRAMUSCULAR | Status: DC | PRN
  Administered 2019-03-20: 4 mg via INTRAVENOUS
  Filled 2019-03-20: qty 2

## 2019-03-20 MED ORDER — DEXAMETHASONE SODIUM PHOSPHATE 4 MG/ML IJ SOLN
4.0000 mg | INTRAMUSCULAR | Status: DC
  Filled 2019-03-20: qty 1

## 2019-03-20 MED ORDER — PROPOFOL 10 MG/ML IV BOLUS
INTRAVENOUS | Status: DC | PRN
  Administered 2019-03-20: 130 mg via INTRAVENOUS

## 2019-03-20 MED ORDER — BUPIVACAINE HCL 0.25 % IJ SOLN
INTRAMUSCULAR | Status: DC | PRN
  Administered 2019-03-20: 25 mL
  Administered 2019-03-20: 5 mL

## 2019-03-20 MED ORDER — DEXAMETHASONE SODIUM PHOSPHATE 10 MG/ML IJ SOLN
INTRAMUSCULAR | Status: DC | PRN
  Administered 2019-03-20 (×2): 5 mg via INTRAVENOUS

## 2019-03-20 MED ORDER — HEPARIN SODIUM (PORCINE) 5000 UNIT/ML IJ SOLN
INTRAMUSCULAR | Status: AC
  Filled 2019-03-20: qty 1

## 2019-03-20 MED ORDER — ROCURONIUM BROMIDE 10 MG/ML (PF) SYRINGE
PREFILLED_SYRINGE | INTRAVENOUS | Status: AC
  Filled 2019-03-20: qty 10

## 2019-03-20 MED ORDER — FENTANYL CITRATE (PF) 100 MCG/2ML IJ SOLN
25.0000 ug | INTRAMUSCULAR | Status: DC | PRN
  Administered 2019-03-20: 25 ug via INTRAVENOUS
  Administered 2019-03-20: 50 ug via INTRAVENOUS
  Administered 2019-03-20: 25 ug via INTRAVENOUS
  Filled 2019-03-20: qty 1

## 2019-03-20 MED ORDER — ONDANSETRON HCL 4 MG/2ML IJ SOLN
INTRAMUSCULAR | Status: DC | PRN
  Administered 2019-03-20: 4 mg via INTRAVENOUS

## 2019-03-20 MED ORDER — TRAMADOL HCL 50 MG PO TABS
50.0000 mg | ORAL_TABLET | Freq: Four times a day (QID) | ORAL | Status: DC | PRN
  Filled 2019-03-20: qty 1

## 2019-03-20 MED ORDER — KETOROLAC TROMETHAMINE 30 MG/ML IJ SOLN
INTRAMUSCULAR | Status: AC
  Filled 2019-03-20: qty 1

## 2019-03-20 MED ORDER — CEFAZOLIN SODIUM-DEXTROSE 2-4 GM/100ML-% IV SOLN
2.0000 g | INTRAVENOUS | Status: AC
  Administered 2019-03-20: 2 g via INTRAVENOUS
  Filled 2019-03-20: qty 100

## 2019-03-20 MED ORDER — ACETAMINOPHEN 10 MG/ML IV SOLN
1000.0000 mg | Freq: Four times a day (QID) | INTRAVENOUS | Status: DC
  Administered 2019-03-20: 1000 mg via INTRAVENOUS
  Filled 2019-03-20: qty 100

## 2019-03-20 SURGICAL SUPPLY — 79 items
ADH SKN CLS APL DERMABOND .7 (GAUZE/BANDAGES/DRESSINGS) ×3
AGENT HMST KT MTR STRL THRMB (HEMOSTASIS)
APL ESCP 34 STRL LF DISP (HEMOSTASIS)
APPLICATOR SURGIFLO ENDO (HEMOSTASIS) IMPLANT
BAG LAPAROSCOPIC 12 15 PORT 16 (BASKET) IMPLANT
BAG RETRIEVAL 12/15 (BASKET)
BAG SPEC RTRVL LRG 6X4 10 (ENDOMECHANICALS) ×3
BLADE SURG 10 STRL SS (BLADE) ×1 IMPLANT
CABLE HIGH FREQUENCY MONO STRZ (ELECTRODE) ×1 IMPLANT
CATH ROBINSON RED A/P 16FR (CATHETERS) ×3 IMPLANT
COVER BACK TABLE 60X90IN (DRAPES) ×4 IMPLANT
COVER TIP SHEARS 8 DVNC (MISCELLANEOUS) ×3 IMPLANT
COVER TIP SHEARS 8MM DA VINCI (MISCELLANEOUS) ×4
COVER WAND RF STERILE (DRAPES) ×4 IMPLANT
DECANTER SPIKE VIAL GLASS SM (MISCELLANEOUS) IMPLANT
DERMABOND ADVANCED (GAUZE/BANDAGES/DRESSINGS) ×1
DERMABOND ADVANCED .7 DNX12 (GAUZE/BANDAGES/DRESSINGS) ×3 IMPLANT
DRAPE ARM DVNC X/XI (DISPOSABLE) ×12 IMPLANT
DRAPE COLUMN DVNC XI (DISPOSABLE) ×3 IMPLANT
DRAPE DA VINCI XI ARM (DISPOSABLE) ×16
DRAPE DA VINCI XI COLUMN (DISPOSABLE) ×4
DRAPE SHEET LG 3/4 BI-LAMINATE (DRAPES) ×4 IMPLANT
DRAPE SURG IRRIG POUCH 19X23 (DRAPES) ×4 IMPLANT
ELECT REM PT RETURN 9FT ADLT (ELECTROSURGICAL) ×8
ELECTRODE REM PT RTRN 9FT ADLT (ELECTROSURGICAL) ×3 IMPLANT
GAUZE 4X4 16PLY RFD (DISPOSABLE) ×4 IMPLANT
GLOVE BIO SURGEON STRL SZ 6 (GLOVE) ×12 IMPLANT
GLOVE BIO SURGEON STRL SZ 6.5 (GLOVE) ×6 IMPLANT
GLOVE SURG SS PI 6.0 STRL IVOR (GLOVE) ×6 IMPLANT
GOWN STRL REUS W/ TWL LRG LVL3 (GOWN DISPOSABLE) ×6 IMPLANT
GOWN STRL REUS W/TWL LRG LVL3 (GOWN DISPOSABLE) ×8
HOLDER FOLEY CATH W/STRAP (MISCELLANEOUS) ×4 IMPLANT
IRRIG SUCT STRYKERFLOW 2 WTIP (MISCELLANEOUS) ×4
IRRIGATION SUCT STRKRFLW 2 WTP (MISCELLANEOUS) ×3 IMPLANT
KIT PROCEDURE DA VINCI SI (MISCELLANEOUS) ×4
KIT PROCEDURE DVNC SI (MISCELLANEOUS) IMPLANT
KIT TURNOVER CYSTO (KITS) IMPLANT
LEGGING LITHOTOMY PAIR STRL (DRAPES) ×4 IMPLANT
MANIPULATOR UTERINE 4.5 ZUMI (MISCELLANEOUS) ×4 IMPLANT
NDL SAFETY ECLIPSE 18X1.5 (NEEDLE) IMPLANT
NDL SPNL 18GX3.5 QUINCKE PK (NEEDLE) IMPLANT
NDL SPNL 22GX3.5 QUINCKE BK (NEEDLE) ×3 IMPLANT
NEEDLE HYPO 18GX1.5 SHARP (NEEDLE)
NEEDLE HYPO 22GX1.5 SAFETY (NEEDLE) ×4 IMPLANT
NEEDLE SPNL 18GX3.5 QUINCKE PK (NEEDLE) ×4 IMPLANT
NEEDLE SPNL 22GX3.5 QUINCKE BK (NEEDLE) ×4 IMPLANT
NS IRRIG 1000ML POUR BTL (IV SOLUTION) ×4 IMPLANT
OBTURATOR OPTICAL STANDARD 8MM (TROCAR) ×4
OBTURATOR OPTICAL STND 8 DVNC (TROCAR) ×3
OBTURATOR OPTICALSTD 8 DVNC (TROCAR) ×3 IMPLANT
PACK ROBOT GYN CUSTOM WL (TRAY / TRAY PROCEDURE) ×4 IMPLANT
PACK ROBOTIC GOWN (GOWN DISPOSABLE) ×4 IMPLANT
PACK VAGINAL MINOR WOMEN LF (CUSTOM PROCEDURE TRAY) ×4 IMPLANT
PAD OB MATERNITY 4.3X12.25 (PERSONAL CARE ITEMS) ×4 IMPLANT
PAD POSITIONING PINK XL (MISCELLANEOUS) ×4 IMPLANT
PENCIL BUTTON HOLSTER BLD 10FT (ELECTRODE) ×1 IMPLANT
PENCIL SMOKE EVACUATOR (MISCELLANEOUS) IMPLANT
PORT ACCESS TROCAR AIRSEAL 12 (TROCAR) ×3 IMPLANT
PORT ACCESS TROCAR AIRSEAL 5M (TROCAR) ×1
POUCH SPECIMEN RETRIEVAL 10MM (ENDOMECHANICALS) ×1 IMPLANT
SEAL CANN UNIV 5-8 DVNC XI (MISCELLANEOUS) ×9 IMPLANT
SEAL XI 5MM-8MM UNIVERSAL (MISCELLANEOUS) ×12
SET TRI-LUMEN FLTR TB AIRSEAL (TUBING) ×4 IMPLANT
SURGIFLO W/THROMBIN 8M KIT (HEMOSTASIS) IMPLANT
SUT VIC AB 0 CT1 36 (SUTURE) IMPLANT
SUT VIC AB 3-0 SH 27 (SUTURE)
SUT VIC AB 3-0 SH 27X BRD (SUTURE) IMPLANT
SUT VIC AB 4-0 PS2 18 (SUTURE) ×9 IMPLANT
SYR 10ML LL (SYRINGE) ×1 IMPLANT
SYR CONTROL 10ML LL (SYRINGE) ×8 IMPLANT
TOWEL OR 17X26 10 PK STRL BLUE (TOWEL DISPOSABLE) ×4 IMPLANT
TRAP SPECIMEN MUCOUS 40CC (MISCELLANEOUS) IMPLANT
TRAY FOLEY W/BAG SLVR 14FR (SET/KITS/TRAYS/PACK) ×4 IMPLANT
TROCAR BLADELESS OPT 12M 100M (ENDOMECHANICALS) IMPLANT
TUBE CONNECTING 12X1/4 (SUCTIONS) ×4 IMPLANT
UNDERPAD 30X30 (UNDERPADS AND DIAPERS) ×4 IMPLANT
UNDERPAD 30X36 HEAVY ABSORB (UNDERPADS AND DIAPERS) ×8 IMPLANT
WATER STERILE IRR 1000ML POUR (IV SOLUTION) ×4 IMPLANT
YANKAUER SUCT BULB TIP NO VENT (SUCTIONS) ×1 IMPLANT

## 2019-03-20 NOTE — Interval H&P Note (Signed)
History and Physical Interval Note:  03/20/2019 11:27 AM  [name]  has presented today for surgery, with the diagnosis of endometrial cancer.  The various methods of treatment have been discussed with the patient and family. After consideration of risks, benefits and other options for treatment, the patient has consented to  Procedure(s): XI ROBOTIC ASSISTED TOTAL HYSTERECTOMY WITH BILATERAL SALPINGO OOPHORECTOMY,possible laporatomy (N/A) Sentiel lymph node and possbile, LYMPH NODE DISSECTION (N/A) possible, DILATATION AND CURETTAGE with IUD (N/A) INDOCYANINE GREEN FLUORESCENCE IMAGING (ICG) (N/A) as a surgical intervention.  The patient's history has been reviewed, patient examined, no change in status, stable for surgery.  I have reviewed the patient's chart and labs.  Questions were answered to the patient's satisfaction.     Lafonda Mosses

## 2019-03-20 NOTE — Anesthesia Procedure Notes (Signed)
Procedure Name: Intubation Date/Time: 03/20/2019 12:35 PM Performed by: Suan Halter, CRNA Pre-anesthesia Checklist: Patient identified, Emergency Drugs available, Suction available and Patient being monitored Patient Re-evaluated:Patient Re-evaluated prior to induction Oxygen Delivery Method: Circle system utilized Preoxygenation: Pre-oxygenation with 100% oxygen Induction Type: IV induction Ventilation: Mask ventilation without difficulty Laryngoscope Size: Mac and 3 Grade View: Grade I Tube type: Oral Tube size: 7.0 mm Number of attempts: 1 Airway Equipment and Method: Stylet and Oral airway Placement Confirmation: ETT inserted through vocal cords under direct vision,  positive ETCO2 and breath sounds checked- equal and bilateral Secured at: 21 cm Tube secured with: Tape Dental Injury: Teeth and Oropharynx as per pre-operative assessment

## 2019-03-20 NOTE — Discharge Instructions (Addendum)
03/20/2019  Return to work: 4-6 weeks if applicable  Activity: 1. Be up and out of the bed during the day.  Take a nap if needed.  You may walk up steps but be careful and use the hand rail.  Stair climbing will tire you more than you think, you may need to stop part way and rest.   2. No lifting or straining for 6 weeks.  3. No driving for 1 week minimum.  Do not drive if you are taking narcotic pain medicine.  4. Shower daily.  Use soap and water on your incision and pat dry; don't rub.  No tub baths until cleared by your surgeon.   5. No sexual activity and nothing in the vagina for 8 weeks.  6. You may experience a small amount of clear drainage from your incisions, which is normal.  If the drainage persists or increases, please call the office.  7. You may experience vaginal spotting after surgery or around the 6-8 week mark from surgery when the stitches at the top of the vagina begin to dissolve.  The spotting is normal but if you experience heavy bleeding, call our office.  8. Take Tylenol or ibuprofen first for pain and only use narcotic pain medication for severe pain not relieved by the Tylenol or Ibuprofen.  Monitor your Tylenol intake to a max of 4,000 mg.  Diet: 1. Low sodium Heart Healthy Diet is recommended.  2. It is safe to use a laxative, such as Miralax or Colace, if you have difficulty moving your bowels. You can take Sennakot at bedtime every evening to keep bowel movements regular and to prevent constipation.    Wound Care: 1. Keep clean and dry.  Shower daily.  Reasons to call the Doctor:  Fever - Oral temperature greater than 100.4 degrees Fahrenheit  Foul-smelling vaginal discharge  Difficulty urinating  Nausea and vomiting  Increased pain at the site of the incision that is unrelieved with pain medicine.  Difficulty breathing with or without chest pain  New calf pain especially if only on one side  Sudden, continuing increased vaginal bleeding  with or without clots.   Contacts: For questions or concerns you should contact:  Dr. Jeral Pinch at [phone]  Joylene John, NP at [phone]  After Hours: call [phone] and have the GYN Oncologist paged/contacted     Post Anesthesia Home Care Instructions  Activity: Get plenty of rest for the remainder of the day. A responsible individual must stay with you for 24 hours following the procedure.  For the next 24 hours, DO NOT: -Drive a car -Paediatric nurse -Drink alcoholic beverages -Take any medication unless instructed by your physician -Make any legal decisions or sign important papers.  Meals: Start with liquid foods such as gelatin or soup. Progress to regular foods as tolerated. Avoid greasy, spicy, heavy foods. If nausea and/or vomiting occur, drink only clear liquids until the nausea and/or vomiting subsides. Call your physician if vomiting continues.  Special Instructions/Symptoms: Your throat may feel dry or sore from the anesthesia or the breathing tube placed in your throat during surgery. If this causes discomfort, gargle with warm salt water. The discomfort should disappear within 24 hours.  If you had a scopolamine patch placed behind your ear for the management of post- operative nausea and/or vomiting:  1. The medication in the patch is effective for 72 hours, after which it should be removed.  Wrap patch in a tissue and discard in the trash. Wash  hands thoroughly with soap and water. 2. You may remove the patch earlier than 72 hours if you experience unpleasant side effects which may include dry mouth, dizziness or visual disturbances. 3. Avoid touching the patch. Wash your hands with soap and water after contact with the patch.  Do not take any Tylenol until after 11:00 pm today. Do not take any nonsteroidal anti inflammatories until after 9:30 pm today

## 2019-03-20 NOTE — Op Note (Signed)
OPERATIVE NOTE  Pre-operative Diagnosis: endometrial cancer grade 1  Post-operative Diagnosis: same  Operation: Laparoscopic lysis of adhesions, robotic-assisted laparoscopic total hysterectomy with bilateral salpingoophorectomy, SLN biopsy on the right, left pelvic LND   Surgeon: Jeral Pinch MD  Assistant Surgeon: Joylene John, NP   Anesthesia: GET  Urine Output: 200 cc dark yellow urine  Operative Findings: On EUA, small mobile uterus. On intra-abdominal entry, some scarring noted on the right liver edge. Otherwise diaphragm and stomach normal in appearance. Omentum adherent along prior midline incision. Normal appearing small and large bowel. Uterus 6-8cm and normal appearing. Normal adnexa although left tube and ovary elongated and adherent to the sigmoid peritoneum high up along the IP ligament and vessels. Mapping successful only on the right to two sentinel lymph nodes. White plaque like lesions (1-68mm in size) dotting peritoneum of the pelvis (bilateral sidewall, anterior and posterior cul-de-sac) and the uterus. Frozen section of several lesions negative for malignancy. No intra-abdominal or pelvic evidence of disease otherwise.  Estimated Blood Loss:  less than 50 mL      Total IV Fluids: 1400 ml         Specimens: uterus, cervix, bilateral tubes and ovaries, right deep obturator and external iliac SLNs, left pelvic lymph nodes, peritoneal biopsies (sent as frozen)         Complications:  None apparent; patient tolerated the procedure well.         Disposition: PACU - hemodynamically stable.  Procedure Details  The patient was seen in the Holding Room. The risks, benefits, complications, treatment options, and expected outcomes were discussed with the patient.  The patient concurred with the proposed plan, giving informed consent.  The site of surgery properly noted/marked. The patient was identified as [name] and the procedure verified as a Robotic-assisted  hysterectomy with bilateral salpingo oophorectomy with SLN biopsy.   After induction of anesthesia, the patient was draped and prepped in the usual sterile manner. Patient was placed in supine position after anesthesia and draped and prepped in the usual sterile manner as follows: Her arms were tucked to her side with all appropriate precautions.  The shoulders were stabilized with padded shoulder blocks applied to the acromium processes.  The patient was placed in the semi-lithotomy position in [place].  The perineum and vagina were prepped with CholoraPrep. The patient was draped after the CholoraPrep had been allowed to dry for 3 minutes.  A Time Out was held and the above information confirmed.  The urethra was prepped with Betadine. Foley catheter was placed.  A sterile speculum was placed in the vagina.  The cervix was grasped with a single-tooth tenaculum. 2mg  total of ICG was injected into the cervical stroma at 2 and 9 o'clock with 1cc injected at a 1cm and 19mm depth (concentration 0.5mg /ml) in all locations. The cervix was dilated with Kennon Rounds dilators.  The ZUMI uterine manipulator with a small colpotomizer ring was placed without difficulty.  OG tube placement was confirmed and to suction.   Next, a 10 mm skin incision was made 1 cm below the subcostal margin in the midclavicular line.  The 5 mm Optiview port and scope was used for direct entry.  Opening pressure was under 10 mm CO2.  The abdomen was insufflated and the findings were noted as above.   At this point and all points during the procedure, the patient's intra-abdominal pressure did not exceed 15 mmHg.   Next, two  8 mm skin incisions were made in  the left mid-abdomen. A combination or sharp dissection and electrocautery were used to lyse thin and dense adhesions of the omentum to the anterior abdominal wall along prior midline incision. Once free, an 8 mm incision was made inferior to the umbilicus and another was placed about 8  cm lateral to the robot port on the right side.  The 5 mm assist trocar was exchanged for a 10-12 mm port. All ports were placed under direct visualization.  The patient was placed in steep Trendelenburg.  Bowel was already noted to be in the upper abdomen.  The robot was docked in the normal manner.  Several of the peritoneal lesions were excised and sent for frozen section - these returned benign.  The right and left peritoneum were opened parallel to the IP ligament to open the retroperitoneal spaces bilaterally. The round ligaments were transected. The SLN mapping was performed in bilateral pelvic basins. After identifying the ureters, the para rectal and paravesical spaces were opened up entirely with careful dissection below the level of the ureters bilaterally and to the depth of the uterine artery origin in order to skeletonize the uterine "web" and ensure visualization of all parametrial channels. The para-aortic basins were carefully exposed and evaluated for isolated para-aortic SLN's. Lymphatic channels were identified travelling to the following visualized sentinel lymph node's: right deep obturator and external iliac. These SLN's were separated from their surrounding lymphatic tissue, removed and sent for permanent pathology. No mapping was noted on the left.  A pelvic lymph dissection was performed on the left with the following borders: proximally the bifurcation of the common iliac, distally the circumflex iliac vein, laterally the genitofemoral nerve, the medial border was the superior vesicle artery and the deep border was the obturator nerve. All lymphatic tissue was removed and sent to Pathology.   The hysterectomy was started.  The ureter was again noted to be on the medial leaf of the broad ligament.  The peritoneum above the ureter was incised and stretched and the infundibulopelvic ligament was skeletonized, cauterized and cut.  The posterior peritoneum was taken down to the level of  the KOH ring.  The anterior peritoneum was also taken down.  The bladder flap was created to the level of the KOH ring.  The uterine artery on the right side was skeletonized, cauterized and cut in the normal manner.  A similar procedure was performed on the left.  The colpotomy was made and the uterus, cervix, bilateral ovaries and tubes were amputated and delivered through the vagina.  Pedicles were inspected and excellent hemostasis was achieved.    The colpotomy at the vaginal cuff was closed with Vicryl on a CT1 needle in running manner.  Irrigation was used and excellent hemostasis was achieved.  At this point in the procedure was completed.  Robotic instruments were removed under direct visulaization.  The robot was undocked. The fascia at the 10-12 mm port was closed with 0 Vicryl on a UR-5 needle.  The subcuticular tissue was closed with 4-0 Vicryl and the skin was closed with 4-0 Monocryl in a subcuticular manner.  Dermabond was applied.    The vagina was swabbed with  minimal bleeding noted.   All sponge, lap and needle counts were correct x  3.   The patient was transferred to the recovery room in stable condition.  Jeral Pinch, MD

## 2019-03-20 NOTE — Transfer of Care (Signed)
Immediate Anesthesia Transfer of Care Note  Patient: [name]  Procedure(s) Performed: Procedure(s) (LRB): XI ROBOTIC ASSISTED TOTAL HYSTERECTOMY WITH BILATERAL SALPINGO OOPHORECTOMY (N/A) LYMPH NODE DISSECTION (N/A) INDOCYANINE GREEN FLUORESCENCE IMAGING (ICG) (N/A) SENTINEL NODE BIOPSY  Patient Location: PACU  Anesthesia Type: General  Level of Consciousness: awake, oriented, sedated and patient cooperative  Airway & Oxygen Therapy: Patient Spontanous Breathing and Patient connected to face mask oxygen  Post-op Assessment: Report given to PACU RN and Post -op Vital signs reviewed and stable  Post vital signs: Reviewed and stable  Complications: No apparent anesthesia complications  Last Vitals:  Vitals Value Taken Time  BP 128/75 03/20/19 1600  Temp 36.3 C 03/20/19 1555  Pulse 78 03/20/19 1601  Resp 19 03/20/19 1601  SpO2 97 % 03/20/19 1601  Vitals shown include unvalidated device data.  Last Pain:  Vitals:   03/20/19 1052  TempSrc: Oral  PainSc: 2       Patients Stated Pain Goal: 5 (03/20/19 1052)

## 2019-03-21 ENCOUNTER — Telehealth: Payer: Self-pay

## 2019-03-21 NOTE — Telephone Encounter (Signed)
[name] states that she is eating,drinking, and urinating well. She is passing gas. Pt to begin senokot-s 2 tabs bid today.Afebrile. Incisions are D&I. Pt using ibuprofen 400 mg prn. Told her she could increase it to 600 mg if needed every 6 hrs as needed. She can alt. With Tylenol 500 mg q 6 hrs prn. Her pain leve is currently 3-4/10. She used the oxycodone last evening and early am. Pt aware of appointments and office number to call if she has any questions or concerns.

## 2019-03-21 NOTE — Anesthesia Postprocedure Evaluation (Signed)
Anesthesia Post Note  Patient: [name]  Procedure(s) Performed: XI ROBOTIC ASSISTED TOTAL HYSTERECTOMY WITH BILATERAL SALPINGO OOPHORECTOMY (N/A Abdomen) LYMPH NODE DISSECTION (N/A Abdomen) INDOCYANINE GREEN FLUORESCENCE IMAGING (ICG) (N/A ) SENTINEL NODE BIOPSY     Patient location during evaluation: PACU Anesthesia Type: General Level of consciousness: awake and alert Pain management: pain level controlled Vital Signs Assessment: post-procedure vital signs reviewed and stable Respiratory status: spontaneous breathing, nonlabored ventilation, respiratory function stable and patient connected to nasal cannula oxygen Cardiovascular status: blood pressure returned to baseline and stable Postop Assessment: no apparent nausea or vomiting Anesthetic complications: no    Last Vitals:  Vitals:   03/20/19 1851 03/20/19 1949  BP: 118/75 131/74  Pulse:  81  Resp:  16  Temp: 36.4 C 36.5 C  SpO2:  95%    Last Pain:  Vitals:   03/20/19 1920  TempSrc:   PainSc: 3                  Barnet Glasgow

## 2019-03-26 DIAGNOSIS — J3089 Other allergic rhinitis: Secondary | ICD-10-CM | POA: Diagnosis not present

## 2019-03-26 DIAGNOSIS — J301 Allergic rhinitis due to pollen: Secondary | ICD-10-CM | POA: Diagnosis not present

## 2019-03-27 ENCOUNTER — Encounter: Payer: Self-pay | Admitting: Gynecologic Oncology

## 2019-03-27 ENCOUNTER — Inpatient Hospital Stay (HOSPITAL_BASED_OUTPATIENT_CLINIC_OR_DEPARTMENT_OTHER): Payer: Medicare Other | Admitting: Gynecologic Oncology

## 2019-03-27 DIAGNOSIS — C541 Malignant neoplasm of endometrium: Secondary | ICD-10-CM

## 2019-03-27 NOTE — Progress Notes (Signed)
Gynecologic Oncology Telehealth Consult Note: Gyn-Onc  I connected with Adele Schilder on 03/27/19 at 11:15AM EST by telephone and verified that I am speaking with the correct person using two identifiers.  I discussed the limitations, risks, security and privacy concerns of performing an evaluation and management service by telemedicine and the availability of in-person appointments. I also discussed with the patient that there may be a patient responsible charge related to this service. The patient expressed understanding and agreed to proceed.  Other persons participating in the visit and their role in the encounter: none.  Patient's location: home Provider's location: [facility]  Reason for Visit: follow-up and discussion of treatment planning  Treatment History: Oncology History  Endometrial cancer ([place])  02/27/2019 Initial Biopsy   Gr1 EMC on EMB   02/27/2019 Initial Diagnosis   Endometrial cancer ([place])   03/05/2019 Imaging   Pelvic ultrasound: Uterus measures 7.9 x 5.7 x 3.7 cm with an endometrial lining of 2.9 cm.  Bilateral ovaries normal in size and shape.   03/20/2019 Surgery   [facility] LOA, TRH/BSO, SLN on R, left pelvic LND   03/20/2019 Cancer Staging   Staging form: Corpus Uteri - Carcinoma and Carcinosarcoma, AJCC 8th Edition - Clinical stage from 03/20/2019: FIGO Stage IB (cT1b, cN0, cM0) - Signed by Lafonda Mosses, MD on 03/27/2019     Interval History: Reports doing well since surgery. Denies VB or discharge. Reports normal bowel and bladder function. Denies chill and fevers.  Past Medical/Surgical History: Past Medical History:  Diagnosis Date  . Allergic rhinitis   . Arthritis   . Asthma   . Carpal tunnel syndrome on right   . Endometrial cancer ([place])   . GERD (gastroesophageal reflux disease)   . Hyperlipemia   . PMB (postmenopausal bleeding)   . Polio    Left leg  . Unspecified essential hypertension    clearance Dr Schuyler Amor with note on chart    Past  Surgical History:  Procedure Laterality Date  . BREAST BIOPSY Bilateral    benign  . BREAST SURGERY     bilateral fibroid tumors removed  . CARPAL TUNNEL RELEASE     right  . CESAREAN SECTION     x 2  . COLON SURGERY     scar tissue removed  . leg surgery     bilateral, numerous  . LYMPH NODE DISSECTION N/A 03/20/2019   Procedure: LYMPH NODE DISSECTION;  Surgeon: Lafonda Mosses, MD;  Location: [facility];  Service: Gynecology;  Laterality: N/A;  . ROBOTIC ASSISTED TOTAL HYSTERECTOMY WITH BILATERAL SALPINGO OOPHERECTOMY N/A 03/20/2019   Procedure: XI ROBOTIC ASSISTED TOTAL HYSTERECTOMY WITH BILATERAL SALPINGO OOPHORECTOMY;  Surgeon: Lafonda Mosses, MD;  Location: [facility];  Service: Gynecology;  Laterality: N/A;  . SENTINEL NODE BIOPSY  03/20/2019   Procedure: SENTINEL NODE BIOPSY;  Surgeon: Lafonda Mosses, MD;  Location: [facility];  Service: Gynecology;;  . tooth implant     titanium/ front upper right  . TOTAL HIP ARTHROPLASTY     right  . TOTAL HIP REVISION  08/15/2011   Procedure: TOTAL HIP REVISION;  Surgeon: Mauri Pole, MD;  Location: [facility];  Service: Orthopedics;  Laterality: Right;  . vocal surgery     cyst removed  . WRIST SURGERY  2009   left    Family History  Problem Relation Age of Onset  . Kidney cancer Father   . CAD Father   . Melanoma Mother  STAGE 3  . Breast cancer Mother   . Heart attack Mother   . Colon cancer Maternal Grandfather   . Breast cancer Maternal Grandmother        meta to stomach    Social History   Socioeconomic History  . Marital status: Married    Spouse name: Not on file  . Number of children: 2  . Years of education: Not on file  . Highest education level: Not on file  Occupational History  . 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 Smoking status: Never Smoker  . Smokeless tobacco:  Never Used  Substance and Sexual Activity  . Alcohol use: Yes    Alcohol/week: 2.0 standard drinks    Types: 1 Glasses of wine, 1 Cans of beer per week    Comment: beer nightly  . Drug use: No  . Sexual activity: Yes    Birth control/protection: Post-menopausal  Other Topics Concern  . Not on file  Social History Narrative  . Not on file   Social Determinants of Health   Financial Resource Strain:   . Difficulty of Paying Living Expenses:   Food Insecurity:   . Worried About Charity fundraiser in the Last Year:   . Arboriculturist in the Last Year:   Transportation Needs:   . Film/video editor (Medical):   Marland Kitchen Lack of Transportation (Non-Medical):   Physical Activity:   . Days of Exercise per Week:   . Minutes of Exercise per Session:   Stress:   . Feeling of Stress :   Social Connections:   . Frequency of Communication with Friends and Family:   . Frequency of Social Gatherings with Friends and Family:   . Attends Religious Services:   . Active Member of Clubs or Organizations:   . Attends Archivist Meetings:   Marland Kitchen Marital Status:     Current Medications:  Current Outpatient Medications:  .  albuterol (PROVENTIL) (2.5 MG/3ML) 0.083% nebulizer solution, Take 3 mLs (2.5 mg total) by nebulization every 6 (six) hours as needed for wheezing or shortness of breath., Disp: 75 vial, Rfl: 5 .  alendronate (FOSAMAX) 70 MG tablet, Take 70 mg by mouth once a week., Disp: , Rfl:  .  budesonide-formoterol (SYMBICORT) 80-4.5 MCG/ACT inhaler, Inhale 2 puffs into the lungs 2 (two) times daily., Disp: 1 Inhaler, Rfl: 0 .  Ca Phosphate-Cholecalciferol (CALCIUM/VITAMIN D3 GUMMIES) 250-350 MG-UNIT CHEW, Chew 3 tablets by mouth daily., Disp: , Rfl:  .  Cyanocobalamin (VITAMIN B-12 CR) 1500 MCG TBCR, Take 1,000 mcg by mouth daily. , Disp: , Rfl:  .  fluticasone (FLONASE) 50 MCG/ACT nasal spray, Place 2 sprays 2 (two) times daily into both nostrils. (Patient not taking: Reported  on 03/15/2019), Disp: 16 g, Rfl: 2 .  levonorgestrel (MIRENA) 20 MCG/24HR IUD, 1 Intra Uterine Device (1 each total) by Intrauterine route once for 1 dose., Disp: 1 each, Rfl: 0 .  loratadine (CLARITIN) 10 MG tablet, Take 10 mg by mouth daily., Disp: , Rfl:  .  losartan-hydrochlorothiazide (HYZAAR) 100-25 MG per tablet, Take 1 tablet by mouth daily., Disp: , Rfl:  .  montelukast (SINGULAIR) 10 MG tablet, TAKE ONE TABLET AT BEDTIME. (Patient taking differently: Take 10 mg by mouth at bedtime. ), Disp: 30 tablet, Rfl: 5 .  Multiple Vitamin (MULTIVITAMIN WITH MINERALS) TABS tablet, Take 1 tablet by mouth daily., Disp: , Rfl:  .  omeprazole (PRILOSEC) 20 MG capsule, TAKE 1 CAPSULE EVERY DAY. (Patient taking differently: Take 20 mg by mouth daily. ), Disp: 30 capsule, Rfl: 5 .  oxyCODONE (OXY IR/ROXICODONE) 5 MG immediate release tablet, Take 1 tablet (5 mg total) by mouth every 4 (four) hours as needed for severe pain. For AFTER surgery, do not take and drive, Disp: 10 tablet, Rfl: 0 .  senna-docusate (SENOKOT-S) 8.6-50 MG tablet, Take 2 tablets by mouth at bedtime. For AFTER surgery, do not take if having diarrhea, Disp: 30 tablet, Rfl: 1  Review of Symptoms: Negative ROS.  Physical Exam: There were no vitals taken for this visit. Not performed given limitations of phone visit.  Laboratory & Radiologic Studies: None new - pathology from surgery in onc treatment summary above  Assessment & Plan: DARRIANA DENN is a 69 y.o. woman with Stage IB, grade 1 endometrioid endometrial adenocarcinoma (neg LVSI, neg SLNs/LNDs) who presents for follow-up phone visit and discussion regarding treatment plans.  Patient doing well from a post-op standpoint. Discussed findings at the time of surgery and final pathology results. Given age and DOI >50%, discussed recommendation for VBT to decrease risk of local recurrence. Understandably, the patient has concerns about tolerating this given her difficulty with pelvic  exams. I will discuss with Dr. Sondra Come possible methods to help with cylinder placement and toleration for VBT.  I discussed the assessment and treatment plan with the patient. The patient was provided with an opportunity to ask questions and all were answered. The patient agreed with the plan and demonstrated an understanding of the instructions.   The patient was advised to call back or see an in-person evaluation if the symptoms worsen or if the condition fails to improve as anticipated.   15 minutes of total time was spent for this patient encounter, including preparation, face-to-face counseling with the patient and coordination of care, and documentation of the encounter.   Jeral Pinch, MD  Division of Gynecologic Oncology  Department of Obstetrics and Gynecology  [facility]

## 2019-03-28 ENCOUNTER — Ambulatory Visit: Payer: Medicare Other | Admitting: Gynecologic Oncology

## 2019-03-28 DIAGNOSIS — J301 Allergic rhinitis due to pollen: Secondary | ICD-10-CM | POA: Diagnosis not present

## 2019-04-03 ENCOUNTER — Encounter (HOSPITAL_COMMUNITY): Payer: Self-pay | Admitting: Gynecologic Oncology

## 2019-04-04 DIAGNOSIS — J3089 Other allergic rhinitis: Secondary | ICD-10-CM | POA: Diagnosis not present

## 2019-04-04 DIAGNOSIS — J301 Allergic rhinitis due to pollen: Secondary | ICD-10-CM | POA: Diagnosis not present

## 2019-04-06 ENCOUNTER — Other Ambulatory Visit: Payer: Self-pay | Admitting: Pulmonary Disease

## 2019-04-09 ENCOUNTER — Inpatient Hospital Stay: Payer: Medicare Other | Attending: Gynecologic Oncology | Admitting: Gynecologic Oncology

## 2019-04-09 ENCOUNTER — Encounter: Payer: Self-pay | Admitting: Gynecologic Oncology

## 2019-04-09 ENCOUNTER — Other Ambulatory Visit: Payer: Self-pay

## 2019-04-09 VITALS — BP 126/90 | HR 87 | Temp 98.9°F | Resp 17 | Ht 62.0 in | Wt 155.0 lb

## 2019-04-09 DIAGNOSIS — C541 Malignant neoplasm of endometrium: Secondary | ICD-10-CM

## 2019-04-09 DIAGNOSIS — Z90710 Acquired absence of both cervix and uterus: Secondary | ICD-10-CM

## 2019-04-09 DIAGNOSIS — Z90722 Acquired absence of ovaries, bilateral: Secondary | ICD-10-CM

## 2019-04-09 DIAGNOSIS — J3089 Other allergic rhinitis: Secondary | ICD-10-CM | POA: Diagnosis not present

## 2019-04-09 DIAGNOSIS — J301 Allergic rhinitis due to pollen: Secondary | ICD-10-CM | POA: Diagnosis not present

## 2019-04-09 NOTE — Patient Instructions (Signed)
Your incisions are healing well.  I will call you next Monday or Tuesday after our tumor conference to discuss recommendations about radiation.

## 2019-04-09 NOTE — Progress Notes (Signed)
Gynecologic Oncology Return Clinic Visit  04/09/19  Reason for Visit: Postop  Treatment History: Oncology History Overview Note  MSI-stable   Endometrial cancer ([place])  02/27/2019 Initial Biopsy   Gr1 EMC on EMB   02/27/2019 Initial Diagnosis   Endometrial cancer ([place])   03/05/2019 Imaging   Pelvic ultrasound: Uterus measures 7.9 x 5.7 x 3.7 cm with an endometrial lining of 2.9 cm.  Bilateral ovaries normal in size and shape.   03/20/2019 Surgery   [facility] LOA, TRH/BSO, SLN on R, left pelvic LND   03/20/2019 Cancer Staging   Staging form: Corpus Uteri - Carcinoma and Carcinosarcoma, AJCC 8th Edition - Clinical stage from 03/20/2019: FIGO Stage IB (cT1b, cN0, cM0) - Signed by Lafonda Mosses, MD on 03/27/2019     Interval History: Patient is overall doing very well after surgery.  She reports a good appetite without nausea or vomiting.  She reports normal bowel and bladder function.  She denies any vaginal bleeding or discharge.  She denies any fevers or chills.  She has had some pruritus around her incisions.  Past Medical/Surgical History: Past Medical History:  Diagnosis Date  . Allergic rhinitis   . Arthritis   . Asthma   . Carpal tunnel syndrome on right   . Endometrial cancer ([place])   . GERD (gastroesophageal reflux disease)   . Hyperlipemia   . PMB (postmenopausal bleeding)   . Polio    Left leg  . Unspecified essential hypertension    clearance Dr Schuyler Amor with note on chart    Past Surgical History:  Procedure Laterality Date  . BREAST BIOPSY Bilateral    benign  . BREAST SURGERY     bilateral fibroid tumors removed  . CARPAL TUNNEL RELEASE     right  . CESAREAN SECTION     x 2  . COLON SURGERY     scar tissue removed  . leg surgery     bilateral, numerous  . LYMPH NODE DISSECTION N/A 03/20/2019   Procedure: LYMPH NODE DISSECTION;  Surgeon: Lafonda Mosses, MD;  Location: [facility];  Service: Gynecology;  Laterality: N/A;  . ROBOTIC  ASSISTED TOTAL HYSTERECTOMY WITH BILATERAL SALPINGO OOPHERECTOMY N/A 03/20/2019   Procedure: XI ROBOTIC ASSISTED TOTAL HYSTERECTOMY WITH BILATERAL SALPINGO OOPHORECTOMY;  Surgeon: Lafonda Mosses, MD;  Location: [facility];  Service: Gynecology;  Laterality: N/A;  . SENTINEL NODE BIOPSY  03/20/2019   Procedure: SENTINEL NODE BIOPSY;  Surgeon: Lafonda Mosses, MD;  Location: [facility];  Service: Gynecology;;  . tooth implant     titanium/ front upper right  . TOTAL HIP ARTHROPLASTY     right  . TOTAL HIP REVISION  08/15/2011   Procedure: TOTAL HIP REVISION;  Surgeon: Mauri Pole, MD;  Location: [facility];  Service: Orthopedics;  Laterality: Right;  . vocal surgery     cyst removed  . WRIST SURGERY  2009   left    Family History  Problem Relation Age of Onset  . Kidney cancer Father   . CAD Father   . Melanoma Mother        STAGE 3  . Breast cancer Mother   . Heart attack Mother   . Colon cancer Maternal Grandfather   . Breast cancer Maternal Grandmother        meta to stomach    Social History   Socioeconomic History  . Marital status: Married    Spouse name: Not on file  .  Number of children: 2  . Years of education: Not on file  . Highest education level: Not on file  Occupational History  . 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 Smoking status: Never Smoker  . Smokeless tobacco: Never Used  Substance and Sexual Activity  . Alcohol use: Yes    Alcohol/week: 2.0 standard drinks    Types: 1 Glasses of wine, 1 Cans of beer per week    Comment: beer nightly  . Drug use: No  . Sexual activity: Yes    Birth control/protection: Post-menopausal  Other Topics Concern  . Not on file  Social History Narrative  . Not on file   Social Determinants of Health   Financial Resource Strain:   . Difficulty of Paying Living Expenses:   Food Insecurity:   . Worried  About Charity fundraiser in the Last Year:   . Arboriculturist in the Last Year:   Transportation Needs:   . Film/video editor (Medical):   Marland Kitchen Lack of Transportation (Non-Medical):   Physical Activity:   . Days of Exercise per Week:   . Minutes of Exercise per Session:   Stress:   . Feeling of Stress :   Social Connections:   . Frequency of Communication with Friends and Family:   . Frequency of Social Gatherings with Friends and Family:   . Attends Religious Services:   . Active Member of Clubs or Organizations:   . Attends Archivist Meetings:   Marland Kitchen Marital Status:     Current Medications:  Current Outpatient Medications:  .  albuterol (PROVENTIL) (2.5 MG/3ML) 0.083% nebulizer solution, Take 3 mLs (2.5 mg total) by nebulization every 6 (six) hours as needed for wheezing or shortness of breath. (Patient not taking: Reported on 04/09/2019), Disp: 75 vial, Rfl: 5 .  alendronate (FOSAMAX) 70 MG tablet, Take 70 mg by mouth once a week., Disp: , Rfl:  .  Ca Phosphate-Cholecalciferol (CALCIUM/VITAMIN D3 GUMMIES) 250-350 MG-UNIT CHEW, Chew 3 tablets by mouth daily., Disp: , Rfl:  .  Cyanocobalamin (VITAMIN B-12 CR) 1500 MCG TBCR, Take 1,000 mcg by mouth daily. , Disp: , Rfl:  .  fluticasone (FLONASE) 50 MCG/ACT nasal spray, Place 2 sprays 2 (two) times daily into both nostrils. (Patient not taking: Reported on 03/15/2019), Disp: 16 g, Rfl: 2 .  levonorgestrel (MIRENA) 20 MCG/24HR IUD, 1 Intra Uterine Device (1 each total) by Intrauterine route once for 1 dose., Disp: 1 each, Rfl: 0 .  loratadine (CLARITIN) 10 MG tablet, Take 10 mg by mouth daily., Disp: , Rfl:  .  losartan-hydrochlorothiazide (HYZAAR) 100-25 MG per tablet, Take 1 tablet by mouth daily., Disp: , Rfl:  .  montelukast (SINGULAIR) 10 MG tablet, TAKE ONE TABLET AT BEDTIME. (Patient taking differently: Take 10 mg by mouth at bedtime. ), Disp: 30 tablet, Rfl: 5 .  Multiple Vitamin (MULTIVITAMIN WITH MINERALS) TABS tablet,  Take 1 tablet by mouth daily., Disp: , Rfl:  .  omeprazole (PRILOSEC) 20 MG capsule, TAKE 1 CAPSULE EVERY DAY. (Patient taking differently: Take 20 mg by mouth daily. ), Disp: 30 capsule, Rfl: 5 .  oxyCODONE (OXY IR/ROXICODONE) 5 MG immediate release tablet, Take 1 tablet (5 mg total) by mouth every 4 (four) hours as needed for severe pain. For AFTER surgery, do not take and drive (Patient not taking: Reported on 04/09/2019), Disp: 10 tablet, Rfl: 0 .  senna-docusate (  SENOKOT-S) 8.6-50 MG tablet, Take 2 tablets by mouth at bedtime. For AFTER surgery, do not take if having diarrhea (Patient not taking: Reported on 04/09/2019), Disp: 30 tablet, Rfl: 1 .  SYMBICORT 80-4.5 MCG/ACT inhaler, USE 2 PUFFS TWICE A DAY., Disp: 10.2 g, Rfl: 12  Review of Systems: + Pruritus Denies appetite changes, fevers, chills, fatigue, unexplained weight changes. Denies hearing loss, neck lumps or masses, mouth sores, ringing in ears or voice changes. Denies cough or wheezing.  Denies shortness of breath. Denies chest pain or palpitations. Denies leg swelling. Denies abdominal distention, pain, blood in stools, constipation, diarrhea, nausea, vomiting, or early satiety. Denies pain with intercourse, dysuria, frequency, hematuria or incontinence. Denies hot flashes, pelvic pain, vaginal bleeding or vaginal discharge.   Denies joint pain, back pain or muscle pain/cramps. Denies rash, or wounds. Denies dizziness, headaches, numbness or seizures. Denies swollen lymph nodes or glands, denies easy bruising or bleeding. Denies anxiety, depression, confusion, or decreased concentration.  Physical Exam: BP 126/90 (BP Location: Left Arm, Patient Position: Sitting)   Pulse 87   Temp 98.9 F (37.2 C) (Temporal)   Resp 17   Ht '5\' 2"'  (1.575 m)   Wt 155 lb (70.3 kg) Comment: pt stated 5 off due to brace  SpO2 99%   BMI 28.35 kg/m  General: Alert, oriented, no acute distress. HEENT: Normocephalic, atraumatic, sclera  anicteric. Chest: Unlabored breathing on room air. Abdomen: soft, nontender.  Normoactive bowel sounds.  No masses or hepatosplenomegaly appreciated.  Well-healed laparoscopic incisione. Extremities: Grossly normal range of motion.  Warm, well perfused.  No edema bilaterally. GU: Deferred.  Laboratory & Radiologic Studies: A. PERITONEAL LESION, BIOPSY:  - Fibrovascular and adipose tissue, negative for carcinoma.   B. SENTINEL LYMPH NODE, RIGHT EXTERNAL ILIAC, BIOPSY:  - Two lymph nodes, negative for carcinoma (0/2).   C. SENTINEL LYMPH NODE, RIGHT DEEP OBTURATOR, BIOPSY:  - One lymph node, negative for carcinoma (0/1).   D. LYMPH NODES, LEFT PELVIC, DISSECTION:  - Three lymph nodes, negative for carcinoma (0/3).   E. UTERUS, CERVIX AND BILATERAL FALLOPIAN TUBES AND OVARIES,  HYSTERECTOMY AND BILATERAL SALPINGO-OOPHORECTOMY:  - Endometrioid carcinoma, FIGO grade 1, with invasion more than one-half  of the myometrium.  - No involvement of uterine serosa, cervical stroma or adnexa.  - See oncology table.   ONCOLOGY TABLE:   UTERUS, CARCINOMA OR CARCINOSARCOMA   Procedure: Total hysterectomy and bilateral salpingo-oophorectomy  Histologic Type: Endometrioid carcinoma  Histologic Grade: FIGO grade 1  Myometrial invasion: Present    Depth of invasion: 12 mm    Myometrial thickness: 14 mm  Uterine Serosa Involvement: Not identified  Cervical Stromal Involvement: Not identified  Other Tissue/Organ Involvement: Not identified  Lymphovascular Invasion: Not identified  Regional Lymph Nodes:    Examined: 3 Sentinel                 3 Non-sentinel                 6 Total    Lymph nodes with metastasis: 0       Isolated tumor cells (<0.2 mm): 0       Micrometastasis (>0.2 mm and < 2.0 mm): 0       Macrometastasis (>2.0 mm): 0       Extracapsular extension: Not applicable  Representative Tumor Block: E8  MMR/MSI testing:  Will be ordered  Pathologic Stage Classification (pTNM, AJCC 8th edition): pT1b, pN0  Comments: Pancytokeratin performed on the sentinel lymph nodes is  negative.  (v4.1.0.2)   Assessment & Plan: [name] is a 69 y.o. woman with Stage IB Gr1 endometrioid endometrial adenocarcinoma who presents for postop follow-up.  Reviewed pathology again with the patient.  She is overall healing very well from surgery.  Pelvic exam deferred today given her extreme discomfort with exams.  Given her risk factor of deep myometrial invasion over the age of 36 (patient just turned 34), we discussed recommendation for vaginal brachytherapy to decrease her risk of local recurrence.  The patient is concerned about this given her difficulty with pelvic exams.  I share this concern.  We will plan to discuss her case at tumor board with our radiation oncologist to discuss if it may be possible to give her fewer fractions with some premedication.  We will also have to come up with a way that will allow [place] to perform pelvic exams during surveillance visits moving forward.  Given intermediate risk disease, we will plan for every 3 month visits.  I will call the patient after our tumor board discussion next Monday.  15 minutes of total time was spent for this patient encounter, including preparation, face-to-face counseling with the patient and coordination of care, and documentation of the encounter.  Jeral Pinch, MD  Division of Gynecologic Oncology  Department of Obstetrics and Gynecology  [facility]

## 2019-04-14 LAB — SURGICAL PATHOLOGY

## 2019-04-15 ENCOUNTER — Other Ambulatory Visit: Payer: Self-pay | Admitting: Oncology

## 2019-04-15 ENCOUNTER — Encounter: Payer: Self-pay | Admitting: Gynecologic Oncology

## 2019-04-15 ENCOUNTER — Encounter: Payer: Self-pay | Admitting: Oncology

## 2019-04-15 DIAGNOSIS — C541 Malignant neoplasm of endometrium: Secondary | ICD-10-CM

## 2019-04-15 NOTE — Progress Notes (Signed)
Gynecologic Oncology Multi-Disciplinary Disposition Conference Note  Date of the Conference: 04/15/2019  Patient Name: [name]  Referring Provider: Dr. Justin Mend Primary GYN Oncologist: Dr. Berline Lopes  Stage/Disposition:  Stage IB, grade 1 endometrioid endometrial adenocarcinoma. Disposition is to vaginal brachytherapy with premedication and joint exam with Dr. Boykin Peek. Sondra Come.   This Multidisciplinary conference took place involving physicians from [place], [place], Radiation Oncology, Pathology, Radiology along with the Gynecologic Oncology Nurse Practitioner and RN.  Comprehensive assessment of the patient's malignancy, staging, need for surgery, chemotherapy, radiation therapy, and need for further testing were reviewed. Supportive measures, both inpatient and following discharge were also discussed. The recommended plan of care is documented. Greater than 35 minutes were spent correlating and coordinating this patient's care.

## 2019-04-19 DIAGNOSIS — J301 Allergic rhinitis due to pollen: Secondary | ICD-10-CM | POA: Diagnosis not present

## 2019-04-19 DIAGNOSIS — J3089 Other allergic rhinitis: Secondary | ICD-10-CM | POA: Diagnosis not present

## 2019-04-24 NOTE — Progress Notes (Signed)
Radiation Oncology         [phone] ________________________________  Initial Outpatient Consultation  Name: [name] MRN: [id number]  Date: 04/25/2019  DOB: Aug 02, 1950  [id number], [name], MD  Maurice Small, MD   REFERRING PHYSICIAN: Maurice Small, MD  DIAGNOSIS: The encounter diagnosis was Endometrial cancer [facility]).  FIGO stage IB (cT1b, cN0, cM0) endometrioid endometrial adenocarcinoma, grade 1  HISTORY OF PRESENT ILLNESS::[name] is a 69 y.o. female who is accompanied by her husband. The patient presented to Dr. Nelda Marseille on 02/27/2019 with complaint of postmenopausal bleeding that began in early February 2021. Endometrial biopsy at that time revealed endometrioid adenocarcinoma.  The patient was referred to Dr. Berline Lopes and was seen in consultation on 03/13/2019. At that time, they discussed surgical staging with surgical intervention and lymph node assessment.  Pelvic ultrasound on 03/05/2019 showed a uterus measuring 7.9 x 5.7 x 3.7 cm with an endometrial lining of 2.9 cm. Bilateral ovaries were normal in size and shape.  The patient underwent robotic assisted total hysterectomy with bilateral salpingo-oophorectomy and sentinel lymph node biopsy on 03/20/2019 performed by Dr. Berline Lopes. Pathology from the procedure revealed FIGO grade 1 endometrioid carcinoma with invasion of more than one-half of the myometrium. There was no involvement of the uterine serosa, cervical stroma, or adnexa. Three left pelvic lymph nodes, one right deep obturator sentinel lymph node, and two right external iliac sentinel lymph nodes were biopsied and were all negative for carcinoma. Peritoneal lesion was also biopsied and showed fibrovascular and adipose tissue without evidence of carcinoma.  The patient was last seen by Dr. Berline Lopes on 04/09/2019, during which time she was healing well from her surgery. Given her risk factor of deep myometrial invasion and over the age of 40, Dr. Berline Lopes recommended vaginal  brachytherapy to decrease her risk of local recurrence.  The patient's case was discussed at the gynecologic oncology multidisciplinary conference on 04/15/2019. It was recommended that the patient proceed with vaginal brachytherapy.  PREVIOUS RADIATION THERAPY: No  PAST MEDICAL HISTORY:  Past Medical History:  Diagnosis Date  . Allergic rhinitis   . Arthritis   . Asthma   . Carpal tunnel syndrome on right   . Endometrial cancer ([place])   . GERD (gastroesophageal reflux disease)   . Hyperlipemia   . PMB (postmenopausal bleeding)   . Polio    Left leg  . Unspecified essential hypertension    clearance Dr Schuyler Amor with note on chart    PAST SURGICAL HISTORY: Past Surgical History:  Procedure Laterality Date  . BREAST BIOPSY Bilateral    benign  . BREAST SURGERY     bilateral fibroid tumors removed  . CARPAL TUNNEL RELEASE     right  . CESAREAN SECTION     x 2  . COLON SURGERY     scar tissue removed  . leg surgery     bilateral, numerous  . LYMPH NODE DISSECTION N/A 03/20/2019   Procedure: LYMPH NODE DISSECTION;  Surgeon: Lafonda Mosses, MD;  Location: [facility];  Service: Gynecology;  Laterality: N/A;  . ROBOTIC ASSISTED TOTAL HYSTERECTOMY WITH BILATERAL SALPINGO OOPHERECTOMY N/A 03/20/2019   Procedure: XI ROBOTIC ASSISTED TOTAL HYSTERECTOMY WITH BILATERAL SALPINGO OOPHORECTOMY;  Surgeon: Lafonda Mosses, MD;  Location: [facility];  Service: Gynecology;  Laterality: N/A;  . SENTINEL NODE BIOPSY  03/20/2019   Procedure: SENTINEL NODE BIOPSY;  Surgeon: Lafonda Mosses, MD;  Location: [facility];  Service: Gynecology;;  . tooth  implant     titanium/ front upper right  . TOTAL HIP ARTHROPLASTY     right  . TOTAL HIP REVISION  08/15/2011   Procedure: TOTAL HIP REVISION;  Surgeon: Mauri Pole, MD;  Location: [facility];  Service: Orthopedics;  Laterality: Right;  . vocal surgery     cyst removed  . WRIST SURGERY  2009    left    FAMILY HISTORY:  Family History  Problem Relation Age of Onset  . Kidney cancer Father   . CAD Father   . Melanoma Mother        STAGE 3  . Breast cancer Mother   . Heart attack Mother   . Colon cancer Maternal Grandfather   . Breast cancer Maternal Grandmother        meta to stomach    SOCIAL HISTORY:  Social History   Tobacco Use  . Smoking status: Never Smoker  . Smokeless tobacco: Never Used  Substance Use Topics  . Alcohol use: Yes    Alcohol/week: 2.0 standard drinks    Types: 1 Glasses of wine, 1 Cans of beer per week    Comment: beer nightly  . Drug use: No    ALLERGIES:  Allergies  Allergen Reactions  . Molds & Smuts Cough  . Latex     MEDICATIONS:  Current Outpatient Medications  Medication Sig Dispense Refill  . alendronate (FOSAMAX) 70 MG tablet Take 70 mg by mouth once a week.    . Ca Phosphate-Cholecalciferol (CALCIUM/VITAMIN D3 GUMMIES) 250-350 MG-UNIT CHEW Chew 3 tablets by mouth daily.    . Cyanocobalamin (VITAMIN B-12 CR) 1500 MCG TBCR Take 1,000 mcg by mouth daily.     . fluticasone (FLONASE) 50 MCG/ACT nasal spray Place 2 sprays 2 (two) times daily into both nostrils. 16 g 2  . loratadine (CLARITIN) 10 MG tablet Take 10 mg by mouth daily.    Marland Kitchen losartan-hydrochlorothiazide (HYZAAR) 100-25 MG per tablet Take 1 tablet by mouth daily.    . montelukast (SINGULAIR) 10 MG tablet TAKE ONE TABLET AT BEDTIME. (Patient taking differently: Take 10 mg by mouth at bedtime. ) 30 tablet 5  . Multiple Vitamin (MULTIVITAMIN WITH MINERALS) TABS tablet Take 1 tablet by mouth daily.    Marland Kitchen omeprazole (PRILOSEC) 20 MG capsule TAKE 1 CAPSULE EVERY DAY. (Patient taking differently: Take 20 mg by mouth daily. ) 30 capsule 5  . SYMBICORT 80-4.5 MCG/ACT inhaler USE 2 PUFFS TWICE A DAY. 10.2 g 12  . albuterol (PROVENTIL) (2.5 MG/3ML) 0.083% nebulizer solution Take 3 mLs (2.5 mg total) by nebulization every 6 (six) hours as needed for wheezing or shortness of  breath. (Patient not taking: Reported on 04/09/2019) 75 vial 5  . oxyCODONE (OXY IR/ROXICODONE) 5 MG immediate release tablet Take 1 tablet (5 mg total) by mouth every 4 (four) hours as needed for severe pain. For AFTER surgery, do not take and drive (Patient not taking: Reported on 04/25/2019) 10 tablet 0  . senna-docusate (SENOKOT-S) 8.6-50 MG tablet Take 2 tablets by mouth at bedtime. For AFTER surgery, do not take if having diarrhea (Patient not taking: Reported on 04/09/2019) 30 tablet 1   No current facility-administered medications for this encounter.    REVIEW OF SYSTEMS:  A 10+ POINT REVIEW OF SYSTEMS WAS OBTAINED including neurology, dermatology, psychiatry, cardiac, respiratory, lymph, extremities, GI, GU, musculoskeletal, constitutional, reproductive, HEENT.  She denies any numbness in her lower extremities or swelling since her surgery.  She occasionally will have very mild  vaginal spotting since her surgery.  She continues to have mild abdominal bloating since her surgery.  Bowel habits are back to normal.   PHYSICAL EXAM:  height is 5' 1.5" (1.562 m) and weight is 156 lb 12.8 oz (71.1 kg). Her temperature is 98.9 F (37.2 C). Her blood pressure is 147/86 (abnormal) and her pulse is 78. Her respiration is 20 and oxygen saturation is 99%.   General: Alert and oriented, in no acute distress HEENT: Head is normocephalic. Extraocular movements are intact.  Neck: Neck is supple, no palpable cervical or supraclavicular lymphadenopathy. Heart: Regular in rate and rhythm with no murmurs, rubs, or gallops. Chest: Clear to auscultation bilaterally, with no rhonchi, wheezes, or rales. Abdomen: Soft, nontender, nondistended, with no rigidity or guarding. Extremities: No cyanosis or edema.  Brace in place along the left lower extremity due to polio Lymphatics: see Neck Exam Skin: No concerning lesions. Musculoskeletal: symmetric strength and muscle tone throughout, except weakness in her left lower  extremity.  She is unable to raise her left leg against gravity Neurologic: Cranial nerves II through XII are grossly intact. No obvious focalities. Speech is fluent. Coordination is intact. Psychiatric: Judgment and insight are intact. Affect is appropriate. Pelvic exam is not performed today in light of patient's recent surgery.  ECOG = 1  0 - Asymptomatic (Fully active, able to carry on all predisease activities without restriction)  1 - Symptomatic but completely ambulatory (Restricted in physically strenuous activity but ambulatory and able to carry out work of a light or sedentary nature. For example, light housework, office work)  2 - Symptomatic, <50% in bed during the day (Ambulatory and capable of all self care but unable to carry out any work activities. Up and about more than 50% of waking hours)  3 - Symptomatic, >50% in bed, but not bedbound (Capable of only limited self-care, confined to bed or chair 50% or more of waking hours)  4 - Bedbound (Completely disabled. Cannot carry on any self-care. Totally confined to bed or chair)  5 - Death   Eustace Pen MM, Creech RH, Tormey DC, et al. [phone]). "Toxicity and response criteria of the [facility]". [place] Oncol. 5 (6): 649-55  LABORATORY DATA:  Lab Results  Component Value Date   WBC 6.5 03/18/2019   HGB 14.7 03/18/2019   HCT 43.3 03/18/2019   MCV 96.0 03/18/2019   PLT 371 03/18/2019   NEUTROABS 8.1 (H) 02/24/2013   Lab Results  Component Value Date   NA 141 03/18/2019   K 3.3 (L) 03/18/2019   CL 102 03/18/2019   CO2 29 03/18/2019   GLUCOSE 89 03/18/2019   CREATININE 0.67 03/18/2019   CALCIUM 9.2 03/18/2019      RADIOGRAPHY: No results found.    IMPRESSION: FIGO stage IB (cT1b, cN0, cM0) endometrioid endometrial adenocarcinoma, grade 1  Given the findings of a deeply invasive tumor as well as the patient's age of 9 she would be at intermediate risk for recurrence and would recommend  vaginal brachytherapy as adjuvant treatment.  Today, I talked to the patient and husband about the findings and work-up thus far.  We discussed the natural history of endometrial cancer and general treatment, highlighting the role of radiotherapy (brachytherapy) in the management.  We discussed the available radiation techniques, and focused on the details of logistics and delivery.  We reviewed the anticipated acute and late sequelae associated with radiation in this setting.  The patient was encouraged to ask questions  that I answered to the best of my ability.  A patient consent form was discussed and signed.  We retained a copy for our records.  The patient would like to proceed with radiation and will be scheduled for planning and CT simulation at the appropriate time.  Anticipate 5 high-dose-rate treatments directed at the vaginal cuff.  Patient has significant pain with pelvic examinations and would not be able to proceed with vaginal brachytherapy without pretreatment medication.  In talking with the patient and her husband it does not appear that she has significant anxiety but more is an issue of pain.  This seems to have developed later in life and she feels this may have something to do with her polio.  I discussed with the patient that I would be willing to provide pain medication prior to each of her treatments so that she could proceed with recommended therapy.  She understands that she would not be able to drive home after pain medication is given with her treatment.  Her husband agrees to provide transportation for her brachytherapy treatments.  If pain medication alone is not adequate enough then we would also consider giving anxiety medicine while in the clinic.  PLAN: Patient has not had a detailed vaginal cuff examination by Dr. Berline Lopes. We will coordinate this exam with the patient's planning, vaginal cylinder fitting and her first treatment  with appropriate analgesia on board.      ------------------------------------------------  Blair Promise, PhD, MD  This document serves as a record of services personally performed by Gery Pray, MD. It was created on his behalf by Clerance Lav, a trained medical scribe. The creation of this record is based on the scribe's personal observations and the provider's statements to them. This document has been checked and approved by the attending provider.

## 2019-04-25 ENCOUNTER — Encounter: Payer: Self-pay | Admitting: Radiation Oncology

## 2019-04-25 ENCOUNTER — Other Ambulatory Visit: Payer: Self-pay

## 2019-04-25 ENCOUNTER — Ambulatory Visit
Admission: RE | Admit: 2019-04-25 | Discharge: 2019-04-25 | Disposition: A | Discharge status: Home / Self Care | Payer: Medicare Other | Source: Ambulatory Visit | Attending: Radiation Oncology | Admitting: Radiation Oncology

## 2019-04-25 VITALS — BP 147/86 | HR 78 | Temp 98.9°F | Resp 20 | Ht 61.5 in | Wt 156.8 lb

## 2019-04-25 DIAGNOSIS — C541 Malignant neoplasm of endometrium: Secondary | ICD-10-CM | POA: Diagnosis not present

## 2019-04-25 DIAGNOSIS — Z8051 Family history of malignant neoplasm of kidney: Secondary | ICD-10-CM | POA: Insufficient documentation

## 2019-04-25 DIAGNOSIS — K219 Gastro-esophageal reflux disease without esophagitis: Secondary | ICD-10-CM | POA: Diagnosis not present

## 2019-04-25 DIAGNOSIS — G893 Neoplasm related pain (acute) (chronic): Secondary | ICD-10-CM | POA: Diagnosis not present

## 2019-04-25 DIAGNOSIS — I10 Essential (primary) hypertension: Secondary | ICD-10-CM | POA: Diagnosis not present

## 2019-04-25 DIAGNOSIS — Z79899 Other long term (current) drug therapy: Secondary | ICD-10-CM | POA: Insufficient documentation

## 2019-04-25 DIAGNOSIS — J45909 Unspecified asthma, uncomplicated: Secondary | ICD-10-CM | POA: Diagnosis not present

## 2019-04-25 DIAGNOSIS — Z90710 Acquired absence of both cervix and uterus: Secondary | ICD-10-CM | POA: Diagnosis not present

## 2019-04-25 DIAGNOSIS — Z800 Family history of malignant neoplasm of digestive organs: Secondary | ICD-10-CM | POA: Diagnosis not present

## 2019-04-25 DIAGNOSIS — M129 Arthropathy, unspecified: Secondary | ICD-10-CM | POA: Insufficient documentation

## 2019-04-25 DIAGNOSIS — Z803 Family history of malignant neoplasm of breast: Secondary | ICD-10-CM | POA: Insufficient documentation

## 2019-04-25 DIAGNOSIS — E785 Hyperlipidemia, unspecified: Secondary | ICD-10-CM | POA: Diagnosis not present

## 2019-04-25 NOTE — Patient Instructions (Signed)
Coronavirus (COVID-19) Are you at risk?  Are you at risk for the Coronavirus (COVID-19)?  To be considered HIGH RISK for Coronavirus (COVID-19), you have to meet the following criteria:  . Traveled to [place], [place], [place], [place] or [place]; or in the [place] to [place], [place], [place], or [place]; and have fever, cough, and shortness of breath within the last 2 weeks of travel OR . Been in close contact with a person diagnosed with COVID-19 within the last 2 weeks and have fever, cough, and shortness of breath . IF YOU DO NOT MEET THESE CRITERIA, YOU ARE CONSIDERED LOW RISK FOR COVID-19.  What to do if you are HIGH RISK for COVID-19?  . If you are having a medical emergency, call 911. . Seek medical care right away. Before you go to a doctor's office, urgent care or emergency department, call ahead and tell them about your recent travel, contact with someone diagnosed with COVID-19, and your symptoms. You should receive instructions from your physician's office regarding next steps of care.  . When you arrive at healthcare provider, tell the healthcare staff immediately you have returned from visiting [place], [place], [place], [place] or [place]; or traveled in the [place] to [place], [place], [place], or [place]; in the last two weeks or you have been in close contact with a person diagnosed with COVID-19 in the last 2 weeks.   . Tell the health care staff about your symptoms: fever, cough and shortness of breath. . After you have been seen by a medical provider, you will be either: o Tested for (COVID-19) and discharged home on quarantine except to seek medical care if symptoms worsen, and asked to  - Stay home and avoid contact with others until you get your results (4-5 days)  - Avoid travel on public transportation if possible (such as bus, train, or airplane) or o Sent to the Emergency Department by EMS for evaluation, COVID-19 testing, and possible  admission depending on your condition and test results.  What to do if you are LOW RISK for COVID-19?  Reduce your risk of any infection by using the same precautions used for avoiding the common cold or flu:  . Wash your hands often with soap and warm water for at least 20 seconds.  If soap and water are not readily available, use an alcohol-based hand sanitizer with at least 60% alcohol.  . If coughing or sneezing, cover your mouth and nose by coughing or sneezing into the elbow areas of your shirt or coat, into a tissue or into your sleeve (not your hands). . Avoid shaking hands with others and consider head nods or verbal greetings only. . Avoid touching your eyes, nose, or mouth with unwashed hands.  . Avoid close contact with people who are sick. . Avoid places or events with large numbers of people in one location, like concerts or sporting events. . Carefully consider travel plans you have or are making. . If you are planning any travel outside or inside the [place], visit the CDC's Travelers' Health webpage for the latest health notices. . If you have some symptoms but not all symptoms, continue to monitor at home and seek medical attention if your symptoms worsen. . If you are having a medical emergency, call 911.   ADDITIONAL HEALTHCARE OPTIONS FOR PATIENTS  [facility] / e-Visit: [email or web address]         [facility]: [phone]  [facility]: [phone]                   [facility]: [phone]   

## 2019-04-25 NOTE — Progress Notes (Signed)
[name] presents today for an initial consult with Dr. Sondra Come. Denies dysuria and hematuria. Reports light vaginal spotting intermittent. Denies rectal bleeding, diarrhea or constipation. Reports some abdominal bloating  Postoperatively. Denies nausea and vomiting.    Pt has not had XRT. Pt denies cardiac pacemaker, ICD. Pt denies methotrexate.   BP (!) 147/86 (BP Location: Right Arm, Patient Position: Sitting, Cuff Size: Normal)   Pulse 78   Temp 98.9 F (37.2 C)   Resp 20   Ht 5' 1.5" (1.562 m)   Wt 156 lb 12.8 oz (71.1 kg)   SpO2 99%   BMI 29.15 kg/m   Wt Readings from Last 3 Encounters:  04/25/19 156 lb 12.8 oz (71.1 kg)  04/09/19 155 lb (70.3 kg)  03/20/19 153 lb 14.4 oz (69.8 kg)   Loma Sousa, RN BSN

## 2019-05-01 ENCOUNTER — Telehealth: Payer: Self-pay | Admitting: *Deleted

## 2019-05-01 NOTE — Telephone Encounter (Signed)
CALLED PATIENT TO INFORM OF NEW HDR [place] FOR 05-06-19, LVM FOR A RETURN CALL

## 2019-05-03 ENCOUNTER — Telehealth: Payer: Self-pay | Admitting: *Deleted

## 2019-05-03 NOTE — Telephone Encounter (Signed)
Called patient to remind of New HDR [place] for 05-06-19, lvm for a return call

## 2019-05-05 NOTE — Progress Notes (Signed)
  Radiation Oncology         [phone]) [phone] ________________________________  Name: [name] MRN: [id number]  Date: 05/06/2019  DOB: 07-Jan-1950  SIMULATION AND TREATMENT PLANNING NOTE HDR BRACHYTHERAPY  DIAGNOSIS: FIGO stage IB (cT1b, cN0, cM0) endometrioid endometrial adenocarcinoma, grade 1  NARRATIVE:  The patient was brought to the [place] suite.  Identity was confirmed.  All relevant records and images related to the planned course of therapy were reviewed.  The patient freely provided informed written consent to proceed with treatment after reviewing the details related to the planned course of therapy. The consent form was witnessed and verified by the simulation staff.  Then, the patient was set-up in a stable reproducible supine position for radiation therapy.  CT images were obtained.  Surface markings were placed.  The CT images were loaded into the planning software.  Then the target and avoidance structures were contoured.  Treatment planning then occurred.  The radiation prescription was entered and confirmed.   I have requested : Brachytherapy Isodose Plan and Dosimetry Calculations to plan the radiation distribution.    PLAN:  The patient will receive 30 Gy Gy in 5 fractions.  The patient will be treated with a 2.5 cm diameter segmented cylinder with a 3 cm treatment length.  Iridium 192 will be the high-dose-rate source.  ________________________________   Blair Promise, PhD, MD  This document serves as a record of services personally performed by Gery Pray, MD. It was created on his behalf by Clerance Lav, a trained medical scribe. The creation of this record is based on the scribe's personal observations and the provider's statements to them. This document has been checked and approved by the attending provider.

## 2019-05-05 NOTE — Progress Notes (Signed)
Radiation Oncology         [phone] ________________________________  Vaginal Brachytherapy Procedure Note  Name: [name] MRN: [id number]  Date: 05/06/2019  DOB: August 27, 1950    ICD-10-CM   1. Endometrial cancer (HCC)  C54.1 HYDROmorphone (DILAUDID) injection 0.5 mg    Diagnosis: FIGO stage IB (cT1b, cN0, cM0) endometrioid endometrial adenocarcinoma, grade 1  Narrative: The patient returns today for vaginal cylinder fitting. She was seen in consultation on 04/25/2019. At that time, the patient had not had a detailed vaginal cuff examination by Dr. Berline Lopes secondary to significant pain with pelvic examinations. The patient was to proceed with radiation therapy directed at the vaginal cuff and is to receive pain medication prior to each treatment.  On review of systems, the patient reports chronic low back pain secondary to fall in church parking lot one year ago. The patient also reports scant vaginal bleeding approximately every other day. The patient denies vaginal odor, vaginal itching, vaginal discharge, urinary frequency, urinary urgency, dysuria, diarrhea, constipation, headache, dizziness, nausea, vomiting, and poor appetite.  Allergies:  is allergic to molds & smuts and latex.  Meds: Current Outpatient Medications  Medication Sig Dispense Refill  . alendronate (FOSAMAX) 70 MG tablet Take 70 mg by mouth once a week.    . Ca Phosphate-Cholecalciferol (CALCIUM/VITAMIN D3 GUMMIES) 250-350 MG-UNIT CHEW Chew 3 tablets by mouth daily.    . Cyanocobalamin (VITAMIN B-12 CR) 1500 MCG TBCR Take 1,000 mcg by mouth daily.     . fluticasone (FLONASE) 50 MCG/ACT nasal spray Place 2 sprays 2 (two) times daily into both nostrils. 16 g 2  . loratadine (CLARITIN) 10 MG tablet Take 10 mg by mouth daily.    Marland Kitchen losartan-hydrochlorothiazide (HYZAAR) 100-25 MG per tablet Take 1 tablet by mouth daily.    . montelukast (SINGULAIR) 10 MG tablet TAKE ONE TABLET AT BEDTIME. (Patient taking  differently: Take 10 mg by mouth at bedtime. ) 30 tablet 5  . Multiple Vitamin (MULTIVITAMIN WITH MINERALS) TABS tablet Take 1 tablet by mouth daily.    Marland Kitchen omeprazole (PRILOSEC) 20 MG capsule TAKE 1 CAPSULE EVERY DAY. (Patient taking differently: Take 20 mg by mouth daily. ) 30 capsule 5  . SYMBICORT 80-4.5 MCG/ACT inhaler USE 2 PUFFS TWICE A DAY. 10.2 g 12  . albuterol (PROVENTIL) (2.5 MG/3ML) 0.083% nebulizer solution Take 3 mLs (2.5 mg total) by nebulization every 6 (six) hours as needed for wheezing or shortness of breath. (Patient not taking: Reported on 04/09/2019) 75 vial 5   No current facility-administered medications for this encounter.    Physical Findings:  The patient is in no acute distress. Patient is alert and oriented.  height is 5\' 1"  (1.549 m) and weight is 155 lb (70.3 kg). Her temperature is 98.7 F (37.1 C). Her blood pressure is 146/90 (abnormal) and her pulse is 87. Her respiration is 18 and oxygen saturation is 98%.  No significant changes. Lungs are clear to auscultation bilaterally. Heart has regular rate and rhythm. No palpable cervical, supraclavicular, or axillary adenopathy. Abdomen soft, non-tender, normal bowel sounds. On pelvic examination the external genitalia were unremarkable. A speculum exam was performed. There are no mucosal lesions noted in the vaginal vault. On bimanual  examination there were no pelvic masses appreciated.  Vaginal cuff healed well.  The patient was examined by Dr. Berline Lopes confirming healing of the vaginal cuff.  Prior to our exams the patient received 1 mg of Dilaudid given her prior history of severe pain with pelvic  examinations.  She was able to tolerate the exam  well after this medication was given.  Lab Findings: Lab Results  Component Value Date   WBC 6.5 03/18/2019   HGB 14.7 03/18/2019   HCT 43.3 03/18/2019   MCV 96.0 03/18/2019   PLT 371 03/18/2019    Radiographic Findings: No results found.  Impression: FIGO stage IB  (cT1b, cN0, cM0) endometrioid endometrial adenocarcinoma, grade 1  Patient is now ready to proceed with adjuvant vaginal brachytherapy treatments.  Patient was fitted with a 2.5 cm diameter segmented cylinder.  This was the optimal cylinder to distend the vaginal vault without undue discomfort.  Patient tolerated the procedure well after medication as above.  Plan: The patient will proceed with CT simulation and vaginal brachytherapy today.  Patient will require medication prior to the rest of her brachytherapy treatments.  -----------------------------------  Blair Promise, PhD, MD  This document serves as a record of services personally performed by Gery Pray, MD. It was created on his behalf by Clerance Lav, a trained medical scribe. The creation of this record is based on the scribe's personal observations and the provider's statements to them. This document has been checked and approved by the attending provider.

## 2019-05-05 NOTE — Progress Notes (Signed)
  Radiation Oncology         [phone] ________________________________  Name: [name] MRN: [id number]  Date: 05/06/2019  DOB: [birth year]  CC: Maurice Small, MD  Maurice Small, MD  HDR BRACHYTHERAPY NOTE  DIAGNOSIS: FIGO stage IB (cT1b, cN0, cM0) endometrioid endometrial adenocarcinoma, grade 1   Simple treatment device note: Patient had construction of her custom vaginal cylinder. She will be treated with a 2.5 cm diameter segmented cylinder. This conforms to her anatomy without undue discomfort.  Vaginal brachytherapy procedure node: The patient was brought to the [place] suite. Identity was confirmed. All relevant records and images related to the planned course of therapy were reviewed. The patient freely provided informed written consent to proceed with treatment after reviewing the details related to the planned course of therapy. The consent form was witnessed and verified by the simulation staff. Then, the patient was set-up in a stable reproducible supine position for radiation therapy. Pelvic exam revealed the vaginal cuff to be intact . The patient's custom vaginal cylinder was placed in the proximal vagina. This was affixed to the CT/MR stabilization plate to prevent slippage. Patient tolerated the placement well.  Verification simulation note:  A fiducial marker was placed within the vaginal cylinder. An AP and lateral film was then obtained through the pelvis area. This documented accurate position of the vaginal cylinder for treatment.  HDR BRACHYTHERAPY TREATMENT  The remote afterloading device was affixed to the vaginal cylinder by catheter. Patient then proceeded to undergo her first high-dose-rate treatment directed at the proximal vagina. The patient was prescribed a dose of 6.0 gray to be delivered to the mucosal surface. Treatment length was 3.0 cm. Patient was treated with 1 channel using 7 dwell positions. Treatment time was 171.5 seconds. Iridium 192 was the  high-dose-rate source for treatment. The patient tolerated the treatment well. After completion of her therapy, a radiation survey was performed documenting return of the iridium source into the GammaMed safe.   PLAN: The patient will return next week for her second high-dose-rate treatment. ________________________________    Blair Promise, PhD, MD  This document serves as a record of services personally performed by Gery Pray, MD. It was created on his behalf by Clerance Lav, a trained medical scribe. The creation of this record is based on the scribe's personal observations and the provider's statements to them. This document has been checked and approved by the attending provider.

## 2019-05-06 ENCOUNTER — Other Ambulatory Visit: Payer: Self-pay

## 2019-05-06 ENCOUNTER — Ambulatory Visit
Admission: RE | Admit: 2019-05-06 | Discharge: 2019-05-06 | Disposition: A | Discharge status: Home / Self Care | Payer: Medicare Other | Source: Ambulatory Visit | Attending: Radiation Oncology | Admitting: Radiation Oncology

## 2019-05-06 ENCOUNTER — Encounter: Payer: Self-pay | Admitting: Radiation Oncology

## 2019-05-06 ENCOUNTER — Telehealth: Payer: Self-pay | Admitting: *Deleted

## 2019-05-06 VITALS — BP 146/90 | HR 87 | Temp 98.7°F | Resp 18 | Ht 61.0 in | Wt 155.0 lb

## 2019-05-06 DIAGNOSIS — C541 Malignant neoplasm of endometrium: Secondary | ICD-10-CM

## 2019-05-06 DIAGNOSIS — Z79899 Other long term (current) drug therapy: Secondary | ICD-10-CM | POA: Insufficient documentation

## 2019-05-06 MED ORDER — HYDROMORPHONE HCL 1 MG/ML IJ SOLN
0.5000 mg | Freq: Once | INTRAMUSCULAR | Status: AC
  Administered 2019-05-06: 1 mg via INTRAMUSCULAR
  Filled 2019-05-06: qty 1

## 2019-05-06 NOTE — Telephone Encounter (Signed)
CALLED PATIENT TO INFORM OF HDR [place] TXS., LVM FOR A RETURN CALL

## 2019-05-06 NOTE — Telephone Encounter (Signed)
RETURNED PATIENT'S PHONE CALL, LVM FOR A RETURN CALL 

## 2019-05-06 NOTE — Progress Notes (Signed)
Weight stable. BP elevated related to anxiety. Reports discomfort in low back related to fall in church parking lot one year ago. Reports visualizing scant blood every other day approximately. Denies vaginal odor, itching or discharge. Denies urinary frequency, urgency or dysuria. Denies diarrhea or constipation. Denies headache, dizziness, nausea or vomiting. Denies a poor appetite. Reports continued difficulty sleeping with the exception of last night. Enjoys leading the music at church.   BP (!) 146/90   Pulse 87   Temp 98.7 F (37.1 C)   Resp 18   Ht 5\' 1"  (1.549 m)   Wt 155 lb (70.3 kg)   SpO2 98%   BMI 29.29 kg/m  Wt Readings from Last 3 Encounters:  05/06/19 155 lb (70.3 kg)  04/25/19 156 lb 12.8 oz (71.1 kg)  04/09/19 155 lb (70.3 kg)

## 2019-05-06 NOTE — Progress Notes (Signed)
See progress note under physician encounter. 

## 2019-05-08 ENCOUNTER — Telehealth: Payer: Self-pay | Admitting: *Deleted

## 2019-05-08 NOTE — Telephone Encounter (Signed)
CALLED PATIENT TO INFORM OF APPT. DATES AND TIMES FOR HDR [place] TXS., LVM FOR A RETURN CALL

## 2019-05-09 ENCOUNTER — Encounter: Payer: Self-pay | Admitting: Radiation Oncology

## 2019-05-09 DIAGNOSIS — J301 Allergic rhinitis due to pollen: Secondary | ICD-10-CM | POA: Diagnosis not present

## 2019-05-09 DIAGNOSIS — J3089 Other allergic rhinitis: Secondary | ICD-10-CM | POA: Diagnosis not present

## 2019-05-15 ENCOUNTER — Telehealth: Payer: Self-pay | Admitting: *Deleted

## 2019-05-15 DIAGNOSIS — J3089 Other allergic rhinitis: Secondary | ICD-10-CM | POA: Diagnosis not present

## 2019-05-15 DIAGNOSIS — J301 Allergic rhinitis due to pollen: Secondary | ICD-10-CM | POA: Diagnosis not present

## 2019-05-15 NOTE — Telephone Encounter (Signed)
CALLED PATIENT TO REMIND OF APPT. FOR 05-16-19- ARRIVAL TIME- 10:15 AM @ [facility], LVM FOR A RETURN CALL

## 2019-05-15 NOTE — Progress Notes (Signed)
  Radiation Oncology         [phone] ________________________________  Name: [name] MRN: [id number]  Date: 05/16/2019  DOB: 1950-11-19  CC: Maurice Small, MD  Maurice Small, MD  HDR BRACHYTHERAPY NOTE  DIAGNOSIS: FIGO stage IB (cT1b, cN0, cM0) endometrioid endometrial adenocarcinoma, grade 1   Simple treatment device note: Patient had construction of her custom vaginal cylinder. She will be treated with a 2.5 cm diameter segmented cylinder. This conforms to her anatomy without undue discomfort.  Vaginal brachytherapy procedure node: The patient was brought to the [place] suite. Identity was confirmed. All relevant records and images related to the planned course of therapy were reviewed. The patient freely provided informed written consent to proceed with treatment after reviewing the details related to the planned course of therapy. The consent form was witnessed and verified by the simulation staff. Then, the patient was set-up in a stable reproducible supine position for radiation therapy. Pelvic exam revealed the vaginal cuff to be intact . The patient's custom vaginal cylinder was placed in the proximal vagina. This was affixed to the CT/MR stabilization plate to prevent slippage. Patient tolerated the placement well.  Verification simulation note:  A fiducial marker was placed within the vaginal cylinder. An AP and lateral film was then obtained through the pelvis area. This documented accurate position of the vaginal cylinder for treatment.  HDR BRACHYTHERAPY TREATMENT  The remote afterloading device was affixed to the vaginal cylinder by catheter. Patient then proceeded to undergo her second high-dose-rate treatment directed at the proximal vagina. The patient was prescribed a dose of 6.0 gray to be delivered to the mucosal surface. Treatment length was 3.0 cm. Patient was treated with 1 channel using 7 dwell positions. Treatment time was 188.3 seconds. Iridium 192 was the  high-dose-rate source for treatment. The patient tolerated the treatment well. After completion of her therapy, a radiation survey was performed documenting return of the iridium source into the Gamma[employer] safe.   PLAN: The patient will return next week for her third high-dose-rate treatment.  Patient was given 0.5 mg of Dilaudid prior to the procedure given her history of severe pain with pelvic exams.  Patient did have problems with nausea after her first brachytherapy treatment and therefore received Zofran 4 mg prior to procedure. She tolerated procedure quite well today. ________________________________    Blair Promise, PhD, MD  This document serves as a record of services personally performed by Gery Pray, MD. It was created on his behalf by Clerance Lav, a trained medical scribe. The creation of this record is based on the scribe's personal observations and the provider's statements to them. This document has been checked and approved by the attending provider.

## 2019-05-16 ENCOUNTER — Ambulatory Visit
Admission: RE | Admit: 2019-05-16 | Discharge: 2019-05-16 | Disposition: A | Discharge status: Home / Self Care | Payer: Medicare Other | Source: Ambulatory Visit | Attending: Radiation Oncology | Admitting: Radiation Oncology

## 2019-05-16 ENCOUNTER — Ambulatory Visit: Payer: Medicare Other | Admitting: Radiation Oncology

## 2019-05-16 ENCOUNTER — Other Ambulatory Visit: Payer: Self-pay

## 2019-05-16 VITALS — BP 144/76 | HR 74 | Temp 97.9°F | Resp 18 | Wt 158.0 lb

## 2019-05-16 VITALS — BP 145/99 | HR 87 | Temp 98.9°F | Resp 20 | Ht 61.5 in | Wt 158.0 lb

## 2019-05-16 DIAGNOSIS — C541 Malignant neoplasm of endometrium: Secondary | ICD-10-CM

## 2019-05-16 MED ORDER — HYDROMORPHONE HCL 1 MG/ML IJ SOLN
0.5000 mg | Freq: Once | INTRAMUSCULAR | Status: AC
  Administered 2019-05-16: 0.5 mg via INTRAMUSCULAR
  Filled 2019-05-16: qty 1

## 2019-05-16 MED ORDER — ONDANSETRON HCL 4 MG PO TABS
4.0000 mg | ORAL_TABLET | Freq: Once | ORAL | Status: AC
  Administered 2019-05-16: 4 mg via ORAL
  Filled 2019-05-16: qty 1

## 2019-05-16 NOTE — Progress Notes (Signed)
HDR treatment complete. Received patient in exam 1 following treatment. Patient confirms that she feel woozy but otherwise fine. Denies nausea or vomiting. Assisted patient with dawning her street clothes. Vitals stable. Wheeled patient to lobby for discharge. Patient discharged home with husband in no distress.   BP (!) 144/76   Pulse 74   Temp 97.9 F (36.6 C)   Resp 18   Wt 158 lb (71.7 kg)   SpO2 95%   BMI 29.37 kg/m

## 2019-05-16 NOTE — Progress Notes (Signed)
Patient here for pre medication prior to HDR treatment today.

## 2019-05-16 NOTE — Progress Notes (Signed)
Patient here for premedication prior to her HDR tx. Patient was given 0.5 mg IM at right deltoid and 4 mg Zofran PO. Patient alert and oriented. Sim advised patient is ready. She is resting on a stretcher comfortably.  Vitals:   05/16/19 1038  BP: (!) 145/99  Pulse: 87  Temp: 98.9 F (37.2 C)  Resp: 20  Weight: 158 lb (71.7 kg)  SpO2: 99%  TempSrc: Oral  BMI (Calculated): 29.37

## 2019-05-17 ENCOUNTER — Telehealth: Payer: Self-pay | Admitting: *Deleted

## 2019-05-17 DIAGNOSIS — J3089 Other allergic rhinitis: Secondary | ICD-10-CM | POA: Diagnosis not present

## 2019-05-17 NOTE — Telephone Encounter (Signed)
CALLED PATIENT TO REMIND OF HDR [name]. FOR 05-20-19, SPOKE WITH PATIENT AND SHE IS AWARE OF THIS [name].

## 2019-05-19 NOTE — Progress Notes (Signed)
  Radiation Oncology         (336) [phone] ________________________________  Name: [name] MRN: [id number]  Date: 05/20/2019  DOB: 02/16/1950  CC: Maurice Small, MD  Maurice Small, MD  HDR BRACHYTHERAPY NOTE  DIAGNOSIS: FIGO stage IB (cT1b, cN0, cM0) endometrioid endometrial adenocarcinoma, grade 1   Simple treatment device note: Patient had construction of her custom vaginal cylinder. She will be treated with a 2.5 cm diameter segmented cylinder. This conforms to her anatomy without undue discomfort.  Vaginal brachytherapy procedure node: The patient was brought to the [place] suite. Identity was confirmed. All relevant records and images related to the planned course of therapy were reviewed. The patient freely provided informed written consent to proceed with treatment after reviewing the details related to the planned course of therapy. The consent form was witnessed and verified by the simulation staff. Then, the patient was set-up in a stable reproducible supine position for radiation therapy. Pelvic exam revealed the vaginal cuff to be intact . The patient's custom vaginal cylinder was placed in the proximal vagina. This was affixed to the CT/MR stabilization plate to prevent slippage. Patient tolerated the placement well.  Verification simulation note:  A fiducial marker was placed within the vaginal cylinder. An AP and lateral film was then obtained through the pelvis area. This documented accurate position of the vaginal cylinder for treatment.  HDR BRACHYTHERAPY TREATMENT  The remote afterloading device was affixed to the vaginal cylinder by catheter. Patient then proceeded to undergo her third high-dose-rate treatment directed at the proximal vagina. The patient was prescribed a dose of 6.0 gray to be delivered to the mucosal surface. Treatment length was 3.0 cm. Patient was treated with 1 channel using 7 dwell positions. Treatment time was 195.4 seconds. Iridium 192 was the  high-dose-rate source for treatment. The patient tolerated the treatment well. After completion of her therapy, a radiation survey was performed documenting return of the iridium source into the Gamma[employer] safe.   PLAN: The patient will return next week for her fourth high-dose-rate treatment. Patient was given 0. 50 worked well today of Dilaudid prior to the procedure given her history of severe pain with pelvic exams. Patient did have problems with nausea after her first brachytherapy treatment and therefore received Zofran 4 mg prior to procedure. She tolerated procedure quite well today.  Patient felt that 0.5 Dilaudid is not enough HDR treatment last week but 1 mg previous week was too much with prolonged sedation therefore the patient was given 0.75 mg IM of Dilaudid. ________________________________    Blair Promise, PhD, MD  This document serves as a record of services personally performed by Gery Pray, MD. It was created on his behalf by Clerance Lav, a trained medical scribe. The creation of this record is based on the scribe's personal observations and the provider's statements to them. This document has been checked and approved by the attending provider.

## 2019-05-20 ENCOUNTER — Ambulatory Visit
Admission: RE | Admit: 2019-05-20 | Discharge: 2019-05-20 | Disposition: A | Discharge status: Home / Self Care | Payer: Medicare Other | Source: Ambulatory Visit | Attending: Radiation Oncology | Admitting: Radiation Oncology

## 2019-05-20 ENCOUNTER — Other Ambulatory Visit: Payer: Self-pay

## 2019-05-20 ENCOUNTER — Ambulatory Visit: Payer: Medicare Other | Admitting: Radiation Oncology

## 2019-05-20 VITALS — BP 136/88 | HR 91 | Temp 98.5°F | Resp 18 | Ht 61.5 in | Wt 155.5 lb

## 2019-05-20 DIAGNOSIS — R102 Pelvic and perineal pain: Secondary | ICD-10-CM | POA: Insufficient documentation

## 2019-05-20 DIAGNOSIS — C541 Malignant neoplasm of endometrium: Secondary | ICD-10-CM

## 2019-05-20 MED ORDER — HYDROMORPHONE HCL 1 MG/ML IJ SOLN
0.7500 mg | INTRAMUSCULAR | Status: AC
  Administered 2019-05-20: 0.75 mg via INTRAMUSCULAR
  Filled 2019-05-20: qty 1

## 2019-05-20 MED ORDER — ONDANSETRON HCL 4 MG PO TABS
4.0000 mg | ORAL_TABLET | Freq: Once | ORAL | Status: AC
  Administered 2019-05-20: 4 mg via ORAL
  Filled 2019-05-20: qty 1

## 2019-05-20 NOTE — Progress Notes (Addendum)
Patient here for premedication for her CT simulation.  Alert and oriented.  Denies pain. Given Zofran PO 4 mg and .75 mg  of Dilaudid at Left deltoid.  BP 136/88 P 91 R 18 Sat 97 %  Patient tolerating medication well.   945 am Patient back in clinic after procedure. BP 130/73 P 84 O2 sat 97%  She reports feeling well other than slight dizziness when she moves too fast. Patient assisted with getting dressed. 955 am Patient discharged by w/c to go home. Husband waiting upstairs.

## 2019-05-22 ENCOUNTER — Telehealth: Payer: Self-pay | Admitting: *Deleted

## 2019-05-22 DIAGNOSIS — J301 Allergic rhinitis due to pollen: Secondary | ICD-10-CM | POA: Diagnosis not present

## 2019-05-22 DIAGNOSIS — J3089 Other allergic rhinitis: Secondary | ICD-10-CM | POA: Diagnosis not present

## 2019-05-22 NOTE — Telephone Encounter (Signed)
Returned patient's phone call, lvm for a return call 

## 2019-05-24 ENCOUNTER — Telehealth: Payer: Self-pay | Admitting: *Deleted

## 2019-05-24 NOTE — Telephone Encounter (Signed)
Called patient to remind of pre-med appt. and HDR [place]. for 05-27-19, spoke with patient and she is aware of these appts.

## 2019-05-26 NOTE — Progress Notes (Signed)
  Radiation Oncology         [phone] ________________________________  Name: [name] MRN: [id number]  Date: 05/27/2019  DOB: May 31, 1950  CC: Maurice Small, MD  Maurice Small, MD  HDR BRACHYTHERAPY NOTE  DIAGNOSIS: FIGO stage IB (cT1b, cN0, cM0) endometrioid endometrial adenocarcinoma, grade 1   Simple treatment device note: Patient had construction of her custom vaginal cylinder. She will be treated with a 2.5 cm diameter segmented cylinder. This conforms to her anatomy without undue discomfort.  Vaginal brachytherapy procedure node: The patient was brought to the [place] suite. Identity was confirmed. All relevant records and images related to the planned course of therapy were reviewed. The patient freely provided informed written consent to proceed with treatment after reviewing the details related to the planned course of therapy. The consent form was witnessed and verified by the simulation staff. Then, the patient was set-up in a stable reproducible supine position for radiation therapy. Pelvic exam revealed the vaginal cuff to be intact . The patient's custom vaginal cylinder was placed in the proximal vagina. This was affixed to the CT/MR stabilization plate to prevent slippage. Patient tolerated the placement well.  Verification simulation note:  A fiducial marker was placed within the vaginal cylinder. An AP and lateral film was then obtained through the pelvis area. This documented accurate position of the vaginal cylinder for treatment.  HDR BRACHYTHERAPY TREATMENT  The remote afterloading device was affixed to the vaginal cylinder by catheter. Patient then proceeded to undergo her fourth high-dose-rate treatment directed at the proximal vagina. The patient was prescribed a dose of 6.0 gray to be delivered to the mucosal surface. Treatment length was 3.0 cm. Patient was treated with 1 channel using 7 dwell positions. Treatment time was 208.7 seconds. Iridium 192 was the  high-dose-rate source for treatment. The patient tolerated the treatment well. After completion of her therapy, a radiation survey was performed documenting return of the iridium source into the Gamma[employer] safe.   PLAN: The patient will return next week for her fifth and final high-dose-rate treatment. Patient did have problems with nausea after her first brachytherapy treatment and therefore received Zofran 4 mg prior to procedure. She tolerated procedure quite well today. Patient felt that 0.5 Dilaudid was not enough for the HDR treatment in prior weeks but 1 mg was too much with prolonged sedation, therefore the patient was given 0.75 mg IM of Dilaudid.  She does admit to significant fatigue for approximately 3 days after each HDR treatment.  ________________________________    Blair Promise, PhD, MD  This document serves as a record of services personally performed by Gery Pray, MD. It was created on his behalf by Clerance Lav, a trained medical scribe. The creation of this record is based on the scribe's personal observations and the provider's statements to them. This document has been checked and approved by the attending provider.

## 2019-05-27 ENCOUNTER — Ambulatory Visit: Payer: Medicare Other | Admitting: Radiation Oncology

## 2019-05-27 ENCOUNTER — Encounter: Payer: Self-pay | Admitting: Radiation Oncology

## 2019-05-27 ENCOUNTER — Ambulatory Visit
Admission: RE | Admit: 2019-05-27 | Discharge: 2019-05-27 | Disposition: A | Discharge status: Home / Self Care | Payer: Medicare Other | Source: Ambulatory Visit | Attending: Radiation Oncology | Admitting: Radiation Oncology

## 2019-05-27 ENCOUNTER — Other Ambulatory Visit: Payer: Self-pay

## 2019-05-27 VITALS — BP 139/86 | HR 88 | Temp 97.8°F | Resp 20 | Ht 61.5 in | Wt 153.4 lb

## 2019-05-27 DIAGNOSIS — C541 Malignant neoplasm of endometrium: Secondary | ICD-10-CM

## 2019-05-27 MED ORDER — HYDROMORPHONE HCL 1 MG/ML IJ SOLN
0.7500 mg | INTRAMUSCULAR | Status: AC
  Administered 2019-05-27: 0.75 mg via INTRAMUSCULAR
  Filled 2019-05-27: qty 1

## 2019-05-27 MED ORDER — ONDANSETRON HCL 4 MG PO TABS
4.0000 mg | ORAL_TABLET | Freq: Once | ORAL | Status: AC
  Administered 2019-05-27: 4 mg via ORAL
  Filled 2019-05-27: qty 1

## 2019-05-27 NOTE — Progress Notes (Addendum)
Patient here for premedications prior to radiation tx. Alert and oriented. Dilaudid .75 mg given IM at left deltoid at 240 pm. Zofran 4 mg PO given at 240 pm also. 250 pm CT Sim advised patient is ready to come over for her appointment.  330 pm Patient returned from [place] and has been resting comfortably. Patient assisted by NT in getting dressed and was discharged home with her husband via w/c at 345 pm.

## 2019-05-28 ENCOUNTER — Encounter: Payer: Medicare Other | Admitting: Radiation Oncology

## 2019-05-28 ENCOUNTER — Ambulatory Visit: Payer: Medicare Other | Admitting: Radiation Oncology

## 2019-05-31 DIAGNOSIS — J3089 Other allergic rhinitis: Secondary | ICD-10-CM | POA: Diagnosis not present

## 2019-05-31 DIAGNOSIS — J301 Allergic rhinitis due to pollen: Secondary | ICD-10-CM | POA: Diagnosis not present

## 2019-06-04 ENCOUNTER — Encounter: Payer: Self-pay | Admitting: Radiation Oncology

## 2019-06-04 ENCOUNTER — Other Ambulatory Visit: Payer: Self-pay

## 2019-06-04 ENCOUNTER — Ambulatory Visit
Admission: RE | Admit: 2019-06-04 | Discharge: 2019-06-04 | Disposition: A | Discharge status: Home / Self Care | Payer: Medicare Other | Source: Ambulatory Visit | Attending: Radiation Oncology | Admitting: Radiation Oncology

## 2019-06-04 DIAGNOSIS — Z923 Personal history of irradiation: Secondary | ICD-10-CM

## 2019-06-04 DIAGNOSIS — C541 Malignant neoplasm of endometrium: Secondary | ICD-10-CM | POA: Diagnosis present

## 2019-06-04 HISTORY — DX: Personal history of irradiation: Z92.3

## 2019-06-04 MED ORDER — HYDROMORPHONE HCL 1 MG/ML IJ SOLN
0.7500 mg | Freq: Once | INTRAMUSCULAR | Status: DC
  Filled 2019-06-04: qty 1

## 2019-06-04 MED ORDER — ONDANSETRON HCL 4 MG PO TABS
4.0000 mg | ORAL_TABLET | Freq: Once | ORAL | Status: DC
  Filled 2019-06-04: qty 1

## 2019-06-04 NOTE — Progress Notes (Signed)
  Radiation Oncology         (336) [phone] ________________________________  Name: [name] MRN: [id number]  Date: 06/04/2019  DOB: 07/30/1950  CC: Maurice Small, MD  Maurice Small, MD  HDR BRACHYTHERAPY NOTE  DIAGNOSIS: FIGO stage IB (cT1b, cN0, cM0) endometrioid endometrial adenocarcinoma, grade 1   Simple treatment device note: Patient had construction of her custom vaginal cylinder. She will be treated with a 2.5 cm diameter segmented cylinder. This conforms to her anatomy without undue discomfort.  Vaginal brachytherapy procedure node: The patient was brought to the [place] suite. Identity was confirmed. All relevant records and images related to the planned course of therapy were reviewed. The patient freely provided informed written consent to proceed with treatment after reviewing the details related to the planned course of therapy. The consent form was witnessed and verified by the simulation staff. Then, the patient was set-up in a stable reproducible supine position for radiation therapy. Pelvic exam revealed the vaginal cuff to be intact . The patient's custom vaginal cylinder was placed in the proximal vagina. This was affixed to the CT/MR stabilization plate to prevent slippage. Patient tolerated the placement well.  Verification simulation note:  A fiducial marker was placed within the vaginal cylinder. An AP and lateral film was then obtained through the pelvis area. This documented accurate position of the vaginal cylinder for treatment.  HDR BRACHYTHERAPY TREATMENT  The remote afterloading device was affixed to the vaginal cylinder by catheter. Patient then proceeded to undergo her fifth high-dose-rate treatment directed at the proximal vagina. The patient was prescribed a dose of 6.0 gray to be delivered to the mucosal surface. Treatment length was 3.0 cm. Patient was treated with 1 channel using 7 dwell positions. Treatment time was 225.1 seconds. Iridium 192 was the  high-dose-rate source for treatment. The patient tolerated the treatment well. After completion of her therapy, a radiation survey was performed documenting return of the iridium source into the Gamma[employer] safe.   PLAN: Routine follow-up in one month.  Patient did have problems with nausea after her first brachytherapy treatment and therefore received Zofran 4 mg prior to procedure. She tolerated procedure quite well today. Patient felt that 0.5 Dilaudid was not enough for the HDR treatment in prior weeks but 1 mg was too much with prolonged sedation, therefore the patient was given 0.75 mg IM of Dilaudid. She does admit to significant fatigue for approximately 3 days after each HDR treatment.  ________________________________    Blair Promise, PhD, MD  This document serves as a record of services personally performed by Gery Pray, MD. It was created on his behalf by Clerance Lav, a trained medical scribe. The creation of this record is based on the scribe's personal observations and the provider's statements to them. This document has been checked and approved by the attending provider.

## 2019-06-04 NOTE — Progress Notes (Signed)
Patient here for her HDR tx.  She is alert and oriented and denies any pain. Dilaudid .75 mg given Im at left deltoid at 1245pm and Zofran 4 mg PO given at 1245pm. Wt. 153.8 T 98.2 P 83 R 18 BP 179/92 O2 sat 96%  Ct simulation called re: pt. being ready.

## 2019-06-06 ENCOUNTER — Ambulatory Visit: Payer: Medicare Other | Admitting: Radiation Oncology

## 2019-06-06 ENCOUNTER — Encounter: Payer: Self-pay | Admitting: Radiation Oncology

## 2019-06-06 DIAGNOSIS — J3089 Other allergic rhinitis: Secondary | ICD-10-CM | POA: Diagnosis not present

## 2019-06-06 DIAGNOSIS — J301 Allergic rhinitis due to pollen: Secondary | ICD-10-CM | POA: Diagnosis not present

## 2019-06-07 ENCOUNTER — Telehealth: Payer: Self-pay | Admitting: Podiatry

## 2019-06-07 NOTE — Telephone Encounter (Signed)
Pt called asking if she could just order a pair of shoes. She has to have a build up put on by [employer] and the right shoe she purchased from [place] has worn out. She requested a size 8 m and shoe style is orthofeet 987. The same as she ordered before but 1/2 size bigger. I have ordered it and pt is aware of the cost of 150.00. I told pt I would call when it came in and she would have to pay at that time.

## 2019-06-14 DIAGNOSIS — J301 Allergic rhinitis due to pollen: Secondary | ICD-10-CM | POA: Diagnosis not present

## 2019-06-14 DIAGNOSIS — J3089 Other allergic rhinitis: Secondary | ICD-10-CM | POA: Diagnosis not present

## 2019-06-25 ENCOUNTER — Telehealth: Payer: Self-pay | Admitting: Podiatry

## 2019-06-25 NOTE — Telephone Encounter (Signed)
Pt left message checking to see if her shoes had came in.  I returned call and left message we received some shoes but they were the wrong shoe and I have reordered them again and will call when they come in.

## 2019-07-04 ENCOUNTER — Other Ambulatory Visit: Payer: Self-pay

## 2019-07-04 ENCOUNTER — Ambulatory Visit
Admission: RE | Admit: 2019-07-04 | Discharge: 2019-07-04 | Disposition: A | Discharge status: Home / Self Care | Payer: Medicare Other | Source: Ambulatory Visit | Attending: Radiation Oncology | Admitting: Radiation Oncology

## 2019-07-04 ENCOUNTER — Encounter: Payer: Self-pay | Admitting: Radiation Oncology

## 2019-07-04 DIAGNOSIS — Z923 Personal history of irradiation: Secondary | ICD-10-CM | POA: Insufficient documentation

## 2019-07-04 DIAGNOSIS — C541 Malignant neoplasm of endometrium: Secondary | ICD-10-CM | POA: Diagnosis present

## 2019-07-04 DIAGNOSIS — Z79899 Other long term (current) drug therapy: Secondary | ICD-10-CM | POA: Insufficient documentation

## 2019-07-04 DIAGNOSIS — R309 Painful micturition, unspecified: Secondary | ICD-10-CM | POA: Insufficient documentation

## 2019-07-04 NOTE — Progress Notes (Incomplete)
  Patient Name: [name] MRN: [id number] DOB: 06/07/1950 Referring Physician: Maurice Small, (Profile Not Attached) Date of Service: 06/04/2019 [facility], [place]                                                        End Of Treatment Note  Diagnoses: C54.1-Malignant neoplasm of endometrium  Cancer Staging: FIGO stage IB (cT1b, cN0, cM0) endometrioid endometrial adenocarcinoma, grade 1  Intent: Curative  Radiation Treatment Dates: 05/06/2019 through 06/04/2019 Site Technique Total Dose (Gy) Dose per Fx (Gy) Completed Fx Beam Energies  Vagina: Pelvis HDR-brachy 30/30 6 5/5 Ir-192   Narrative: The patient tolerated radiation therapy relatively well. Given her history of severe pain with pelvic exams, she was given Dilaudid prior to HDR procedures. She felt that 0.5 mg was not enough and 1.0 mg was too much with prolonged sedation, so she started receiving 0.75 mg IM of Dilaudid during her third treatment. She reported some nausea after her first brachytherapy treatment and therefore received Zofran 4 mg prior to the procedure. She also reported significant fatigue for approximately three days after each HDR treatment.  Plan: The patient will follow-up with radiation oncology in one month.  ________________________________________________   Blair Promise, PhD, MD  This document serves as a record of services personally performed by Gery Pray, MD. It was created on his behalf by Clerance Lav, a trained medical scribe. The creation of this record is based on the scribe's personal observations and the provider's statements to them. This document has been checked and approved by the attending provider.

## 2019-07-04 NOTE — Progress Notes (Addendum)
Patient here for her 1 month f/u visit. Patient denies all sx except for ongoing left sided discomfort and some mild burning with urination.  Wt Readings from Last 3 Encounters:  07/04/19 152 lb 6.4 oz (69.1 kg)  05/27/19 153 lb 6.4 oz (69.6 kg)  05/20/19 155 lb 8 oz (70.5 kg)    BP (!) 154/88 (BP Location: Left Arm, Patient Position: Sitting, Cuff Size: Normal)   Pulse 86   Temp 98 F (36.7 C)   Resp 20   Ht 5' 1.5" (1.562 m)   Wt 152 lb 6.4 oz (69.1 kg)   SpO2 99%   BMI 28.33 kg/m   Patient given lubricant, and 3 dilators size xs,xs+ and small. Instructions reviewed for use of dilators and handout given. Patient verbalized understanding.

## 2019-07-04 NOTE — Progress Notes (Signed)
Radiation Oncology         [phone]) [phone] ________________________________  Name: [name] MRN: [id number]  Date: 07/04/2019  DOB: [birth year]  Follow-Up Visit Note  CC: Maurice Small, MD  Maurice Small, MD    ICD-10-CM   1. Endometrial cancer (HCC)  C54.1     Diagnosis: FIGO stage IB (cT1b, cN0, cM0) endometrioid endometrial adenocarcinoma, grade 1  Interval Since Last Radiation: One month.  Radiation Treatment Dates: 05/06/2019 through 06/04/2019 Site Technique Total Dose (Gy) Dose per Fx (Gy) Completed Fx Beam Energies  Vagina: Pelvis HDR-brachy 30/30 6 5/5 Ir-192    Narrative:  The patient returns today for routine follow-up.   On review of systems, she reports ongoing left-sided discomfort and mild burning with urination.  She denies any strong odor to her urine or cloudiness.  She did experience significant fatigue with her vaginal brachytherapy but is starting to improve regarding this issue.  ALLERGIES:  is allergic to molds & smuts and latex.  Meds: Current Outpatient Medications  Medication Sig Dispense Refill  . albuterol (PROVENTIL) (2.5 MG/3ML) 0.083% nebulizer solution Take 3 mLs (2.5 mg total) by nebulization every 6 (six) hours as needed for wheezing or shortness of breath. (Patient not taking: Reported on 04/09/2019) 75 vial 5  . alendronate (FOSAMAX) 70 MG tablet Take 70 mg by mouth once a week.    . Ca Phosphate-Cholecalciferol (CALCIUM/VITAMIN D3 GUMMIES) 250-350 MG-UNIT CHEW Chew 3 tablets by mouth daily.    . Cyanocobalamin (VITAMIN B-12 CR) 1500 MCG TBCR Take 1,000 mcg by mouth daily.     . fluticasone (FLONASE) 50 MCG/ACT nasal spray Place 2 sprays 2 (two) times daily into both nostrils. 16 g 2  . loratadine (CLARITIN) 10 MG tablet Take 10 mg by mouth daily.    Marland Kitchen losartan-hydrochlorothiazide (HYZAAR) 100-25 MG per tablet Take 1 tablet by mouth daily.    . montelukast (SINGULAIR) 10 MG tablet TAKE ONE TABLET AT BEDTIME. (Patient taking differently: Take 10 mg by  mouth at bedtime. ) 30 tablet 5  . Multiple Vitamin (MULTIVITAMIN WITH MINERALS) TABS tablet Take 1 tablet by mouth daily.    Marland Kitchen omeprazole (PRILOSEC) 20 MG capsule TAKE 1 CAPSULE EVERY DAY. (Patient taking differently: Take 20 mg by mouth daily. ) 30 capsule 5  . SYMBICORT 80-4.5 MCG/ACT inhaler USE 2 PUFFS TWICE A DAY. 10.2 g 12   No current facility-administered medications for this encounter.    Physical Findings: The patient is in no acute distress. Patient is alert and oriented.  height is 5' 1.5" (1.562 m) and weight is 152 lb 6.4 oz (69.1 kg). Her temperature is 98 F (36.7 C). Her blood pressure is 154/88 (abnormal) and her pulse is 86. Her respiration is 20 and oxygen saturation is 99%.   Lungs are clear to auscultation bilaterally. Heart has regular rate and rhythm. No palpable cervical, supraclavicular, or axillary adenopathy. Abdomen soft, non-tender, normal bowel sounds. Pelvic exam deferred in light of recent treatment completion.  Lab Findings: Lab Results  Component Value Date   WBC 6.5 03/18/2019   HGB 14.7 03/18/2019   HCT 43.3 03/18/2019   MCV 96.0 03/18/2019   PLT 371 03/18/2019    Radiographic Findings: No results found.  Impression: FIGO stage IB (cT1b, cN0, cM0) endometrioid endometrial adenocarcinoma, grade 1  The patient is recovering from the effects of radiation.  As above fatigue has been a significant issue for her which overall is quite unusual with this limited  treatment.  Plan:  The patient will follow-up with Dr. Berline Lopes in in 2 or 5 months pending on recommendations from Dr. Berline Lopes  and with radiation oncology in 5 or 11 months.  Patient was given a vaginal dilator and instructions on its use given her vaginal brachytherapy treatments.  She is unsure whether she will be able to proceed with this recommendation given her exquisite pain with pelvic exams but will make a good attempt.  She will need pain medication with her follow-ups.  For her vaginal  brachytherapy treatments 0.75 Milligrams of Dilaudid IM worked well.  Total time spent in this encounter was 15 minutes which included reviewing the patient's most recent interval history, physical examination, and documentation.  ____________________________________   Blair Promise, PhD, MD  This document serves as a record of services personally performed by Gery Pray, MD. It was created on his behalf by Clerance Lav, a trained medical scribe. The creation of this record is based on the scribe's personal observations and the provider's statements to them. This document has been checked and approved by the attending provider.

## 2019-07-09 ENCOUNTER — Telehealth: Payer: Self-pay | Admitting: Oncology

## 2019-07-09 NOTE — Telephone Encounter (Signed)
Left a message with appointment to see Dr. Berline Lopes on 09/11/19 at 2:45.

## 2019-08-05 ENCOUNTER — Other Ambulatory Visit: Payer: Self-pay | Admitting: Pulmonary Disease

## 2019-09-03 DIAGNOSIS — J301 Allergic rhinitis due to pollen: Secondary | ICD-10-CM | POA: Diagnosis not present

## 2019-09-03 DIAGNOSIS — J3089 Other allergic rhinitis: Secondary | ICD-10-CM | POA: Diagnosis not present

## 2019-09-07 ENCOUNTER — Other Ambulatory Visit: Payer: Self-pay | Admitting: Pulmonary Disease

## 2019-09-10 ENCOUNTER — Telehealth: Payer: Self-pay | Admitting: *Deleted

## 2019-09-10 NOTE — Telephone Encounter (Signed)
Attempted to reach the patient to reschedule her appt, left message to call the office back

## 2019-09-10 NOTE — Telephone Encounter (Signed)
Returned the patient's call and left a message to call the office back to reschedule her appt  

## 2019-09-11 ENCOUNTER — Ambulatory Visit: Payer: Medicare Other | Admitting: Gynecologic Oncology

## 2019-09-11 DIAGNOSIS — J3089 Other allergic rhinitis: Secondary | ICD-10-CM | POA: Diagnosis not present

## 2019-09-11 DIAGNOSIS — J301 Allergic rhinitis due to pollen: Secondary | ICD-10-CM | POA: Diagnosis not present

## 2019-09-17 DIAGNOSIS — J3089 Other allergic rhinitis: Secondary | ICD-10-CM | POA: Diagnosis not present

## 2019-09-17 DIAGNOSIS — J301 Allergic rhinitis due to pollen: Secondary | ICD-10-CM | POA: Diagnosis not present

## 2019-09-20 DIAGNOSIS — J3089 Other allergic rhinitis: Secondary | ICD-10-CM | POA: Diagnosis not present

## 2019-09-20 DIAGNOSIS — J301 Allergic rhinitis due to pollen: Secondary | ICD-10-CM | POA: Diagnosis not present

## 2019-09-24 ENCOUNTER — Other Ambulatory Visit: Payer: Self-pay | Admitting: Gynecologic Oncology

## 2019-09-24 ENCOUNTER — Encounter: Payer: Self-pay | Admitting: Gynecologic Oncology

## 2019-09-24 ENCOUNTER — Other Ambulatory Visit: Payer: Self-pay

## 2019-09-24 ENCOUNTER — Inpatient Hospital Stay: Payer: Medicare Other | Attending: Gynecologic Oncology | Admitting: Gynecologic Oncology

## 2019-09-24 VITALS — BP 157/87 | HR 78 | Temp 96.8°F | Resp 20 | Ht 61.75 in | Wt 148.0 lb

## 2019-09-24 DIAGNOSIS — Z803 Family history of malignant neoplasm of breast: Secondary | ICD-10-CM | POA: Insufficient documentation

## 2019-09-24 DIAGNOSIS — E785 Hyperlipidemia, unspecified: Secondary | ICD-10-CM | POA: Insufficient documentation

## 2019-09-24 DIAGNOSIS — Z7951 Long term (current) use of inhaled steroids: Secondary | ICD-10-CM | POA: Diagnosis not present

## 2019-09-24 DIAGNOSIS — Z923 Personal history of irradiation: Secondary | ICD-10-CM | POA: Insufficient documentation

## 2019-09-24 DIAGNOSIS — R102 Pelvic and perineal pain: Secondary | ICD-10-CM

## 2019-09-24 DIAGNOSIS — Z8249 Family history of ischemic heart disease and other diseases of the circulatory system: Secondary | ICD-10-CM | POA: Diagnosis not present

## 2019-09-24 DIAGNOSIS — Z8542 Personal history of malignant neoplasm of other parts of uterus: Secondary | ICD-10-CM | POA: Diagnosis not present

## 2019-09-24 DIAGNOSIS — K219 Gastro-esophageal reflux disease without esophagitis: Secondary | ICD-10-CM | POA: Diagnosis not present

## 2019-09-24 DIAGNOSIS — Z90710 Acquired absence of both cervix and uterus: Secondary | ICD-10-CM | POA: Insufficient documentation

## 2019-09-24 DIAGNOSIS — I10 Essential (primary) hypertension: Secondary | ICD-10-CM | POA: Insufficient documentation

## 2019-09-24 DIAGNOSIS — Z9079 Acquired absence of other genital organ(s): Secondary | ICD-10-CM | POA: Diagnosis not present

## 2019-09-24 DIAGNOSIS — Z79899 Other long term (current) drug therapy: Secondary | ICD-10-CM | POA: Insufficient documentation

## 2019-09-24 DIAGNOSIS — C541 Malignant neoplasm of endometrium: Secondary | ICD-10-CM

## 2019-09-24 DIAGNOSIS — Z8051 Family history of malignant neoplasm of kidney: Secondary | ICD-10-CM | POA: Diagnosis not present

## 2019-09-24 DIAGNOSIS — J45909 Unspecified asthma, uncomplicated: Secondary | ICD-10-CM | POA: Diagnosis not present

## 2019-09-24 DIAGNOSIS — J3089 Other allergic rhinitis: Secondary | ICD-10-CM | POA: Diagnosis not present

## 2019-09-24 DIAGNOSIS — Z800 Family history of malignant neoplasm of digestive organs: Secondary | ICD-10-CM | POA: Diagnosis not present

## 2019-09-24 DIAGNOSIS — Z90722 Acquired absence of ovaries, bilateral: Secondary | ICD-10-CM | POA: Insufficient documentation

## 2019-09-24 DIAGNOSIS — J301 Allergic rhinitis due to pollen: Secondary | ICD-10-CM | POA: Diagnosis not present

## 2019-09-24 DIAGNOSIS — G8929 Other chronic pain: Secondary | ICD-10-CM

## 2019-09-24 MED ORDER — HYDROMORPHONE HCL 1 MG/ML IJ SOLN: 0.7500 mg | Freq: Once | INTRAMUSCULAR | Status: DC

## 2019-09-24 MED ORDER — HYDROMORPHONE HCL 1 MG/ML IJ SOLN
0.7500 mg | Freq: Once | INTRAMUSCULAR | Status: DC
  Filled 2019-09-24: qty 0.75

## 2019-09-24 MED ORDER — HYDROMORPHONE HCL 1 MG/ML IJ SOLN
INTRAMUSCULAR | Status: AC
  Filled 2019-09-24: qty 1

## 2019-09-24 MED ORDER — HYDROMORPHONE HCL 1 MG/ML IJ SOLN
0.7500 mg | Freq: Once | INTRAMUSCULAR | Status: AC
  Administered 2019-09-24: 0.75 mg via INTRAMUSCULAR

## 2019-09-24 NOTE — Progress Notes (Signed)
Gynecologic Oncology Return Clinic Visit  09/24/19  Reason for Visit: surveillance in the setting of intermediate risk uterine cancer  Treatment History: Oncology History Overview Note  MSI-stable   Endometrial cancer ([place])  02/27/2019 Initial Biopsy   Gr1 EMC on EMB   02/27/2019 Initial Diagnosis   Endometrial cancer ([place])   03/05/2019 Imaging   Pelvic ultrasound: Uterus measures 7.9 x 5.7 x 3.7 cm with an endometrial lining of 2.9 cm.  Bilateral ovaries normal in size and shape.   03/20/2019 Surgery   [facility] LOA, TRH/BSO, SLN on R, left pelvic LND   03/20/2019 Cancer Staging   Staging form: Corpus Uteri - Carcinoma and Carcinosarcoma, AJCC 8th Edition - Clinical stage from 03/20/2019: FIGO Stage IB (cT1b, cN0, cM0) - Signed by Lafonda Mosses, MD on 03/27/2019   05/06/2019 - 06/04/2019 Radiation Therapy   Site Technique Total Dose (Gy) Dose per Fx (Gy) Completed Fx Beam Energies  Vagina: Pelvis HDR-brachy 30/30 6 5/5 Ir-192       Interval History: Patient presents today overall doing very well.  She had her follow-up after completing vaginal brachytherapy in early June with Dr. Randa Ngo.  This was in early July.  At that time she was recovering well without any evidence of disease.  She had some very scant vaginal discharge until the end of April, but she denies any bleeding or discharge since.  She reports a good appetite without nausea or emesis.  She reports regular bowel and bladder function.  She has been using CBD to help her sleep at night with very good success.  Past Medical/Surgical History: Past Medical History:  Diagnosis Date  . Allergic rhinitis   . Arthritis   . Asthma   . Carpal tunnel syndrome on right   . Endometrial cancer ([place])   . GERD (gastroesophageal reflux disease)   . Hyperlipemia   . PMB (postmenopausal bleeding)   . Polio    Left leg  . Unspecified essential hypertension    clearance Dr Schuyler Amor with note on chart    Past Surgical History:   Procedure Laterality Date  . BREAST BIOPSY Bilateral    benign  . BREAST SURGERY     bilateral fibroid tumors removed  . CARPAL TUNNEL RELEASE     right  . CESAREAN SECTION     x 2  . COLON SURGERY     scar tissue removed  . leg surgery     bilateral, numerous  . LYMPH NODE DISSECTION N/A 03/20/2019   Procedure: LYMPH NODE DISSECTION;  Surgeon: Lafonda Mosses, MD;  Location[facility];  Service: Gynecology;  Laterality: N/A;  . ROBOTIC ASSISTED TOTAL HYSTERECTOMY WITH BILATERAL SALPINGO OOPHERECTOMY N/A 03/20/2019   Procedure: XI ROBOTIC ASSISTED TOTAL HYSTERECTOMY WITH BILATERAL SALPINGO OOPHORECTOMY;  Surgeon: Lafonda Mosses, MD;  Location: [facility];  Service: Gynecology;  Laterality: N/A;  . SENTINEL NODE BIOPSY  03/20/2019   Procedure: SENTINEL NODE BIOPSY;  Surgeon: Lafonda Mosses, MD;  Location: [facility];  Service: Gynecology;;  . tooth implant     titanium/ front upper right  . TOTAL HIP ARTHROPLASTY     right  . TOTAL HIP REVISION  08/15/2011   Procedure: TOTAL HIP REVISION;  Surgeon: Mauri Pole, MD;  Location: [facility];  Service: Orthopedics;  Laterality: Right;  . vocal surgery     cyst removed  . WRIST SURGERY  2009   left    Family History  Problem  Relation Age of Onset  . Kidney cancer Father   . CAD Father   . Melanoma Mother        STAGE 3  . Breast cancer Mother   . Heart attack Mother   . Colon cancer Maternal Grandfather   . Breast cancer Maternal Grandmother        meta to stomach    Social History   Socioeconomic History  . Marital status: Married    Spouse name: Not on file  . Number of children: 2  . Years of education: Not on file  . Highest education level: Not on file  Occupational History  . 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 Smoking status: Never Smoker  . Smokeless tobacco: Never Used  Vaping  Use  . Vaping Use: Never used  Substance and Sexual Activity  . Alcohol use: Yes    Alcohol/week: 2.0 standard drinks    Types: 1 Glasses of wine, 1 Cans of beer per week    Comment: beer nightly  . Drug use: No  . Sexual activity: Yes    Birth control/protection: Post-menopausal  Other Topics Concern  . Not on file  Social History Narrative  . Not on file   Social Determinants of Health   Financial Resource Strain:   . Difficulty of Paying Living Expenses: Not on file  Food Insecurity:   . Worried About Charity fundraiser in the Last Year: Not on file  . Ran Out of Food in the Last Year: Not on file  Transportation Needs:   . Lack of Transportation (Medical): Not on file  . Lack of Transportation (Non-Medical): Not on file  Physical Activity:   . Days of Exercise per Week: Not on file  . Minutes of Exercise per Session: Not on file  Stress:   . Feeling of Stress : Not on file  Social Connections:   . Frequency of Communication with Friends and Family: Not on file  . Frequency of Social Gatherings with Friends and Family: Not on file  . Attends Religious Services: Not on file  . Active Member of Clubs or Organizations: Not on file  . Attends Archivist Meetings: Not on file  . Marital Status: Not on file    Current Medications:  Current Outpatient Medications:  .  CANNABIDIOL PO, Take by mouth at bedtime. CBD gymmies, Disp: , Rfl:  .  albuterol (PROVENTIL) (2.5 MG/3ML) 0.083% nebulizer solution, Take 3 mLs (2.5 mg total) by nebulization every 6 (six) hours as needed for wheezing or shortness of breath. (Patient not taking: Reported on 04/09/2019), Disp: 75 vial, Rfl: 5 .  alendronate (FOSAMAX) 70 MG tablet, Take 70 mg by mouth once a week., Disp: , Rfl:  .  Ca Phosphate-Cholecalciferol (CALCIUM/VITAMIN D3 GUMMIES) 250-350 MG-UNIT CHEW, Chew 3 tablets by mouth daily., Disp: , Rfl:  .  Cyanocobalamin (VITAMIN B-12 CR) 1500 MCG TBCR, Take 1,000 mcg by mouth daily.  , Disp: , Rfl:  .  EPINEPHrine 0.3 mg/0.3 mL IJ SOAJ injection, SMARTSIG:Injection IM As Directed, Disp: , Rfl:  .  loratadine (CLARITIN) 10 MG tablet, Take 10 mg by mouth daily., Disp: , Rfl:  .  losartan-hydrochlorothiazide (HYZAAR) 100-25 MG per tablet, Take 1 tablet by mouth daily., Disp: , Rfl:  .  montelukast (SINGULAIR) 10 MG tablet, TAKE ONE TABLET AT BEDTIME., Disp: 30 tablet, Rfl: 1 .  Multiple Vitamin (MULTIVITAMIN  WITH MINERALS) TABS tablet, Take 1 tablet by mouth daily., Disp: , Rfl:  .  omeprazole (PRILOSEC) 20 MG capsule, TAKE 1 CAPSULE EVERY DAY., Disp: 30 capsule, Rfl: 4 .  SYMBICORT 80-4.5 MCG/ACT inhaler, USE 2 PUFFS TWICE A DAY., Disp: 10.2 g, Rfl: 12  Review of Systems: Endorses anxiety about the appointment, very nominal back pain for which she sees a chiropractor, tinnitus. Denies appetite changes, fevers, chills, fatigue, unexplained weight changes. Denies hearing loss, neck lumps or masses, mouth sores, or voice changes. Denies cough or wheezing.  Denies shortness of breath. Denies chest pain or palpitations. Denies leg swelling. Denies abdominal distention, pain, blood in stools, constipation, diarrhea, nausea, vomiting, or early satiety. Denies pain with intercourse, dysuria, frequency, hematuria or incontinence. Denies hot flashes, pelvic pain, vaginal bleeding or vaginal discharge.   Denies joint pain or muscle pain/cramps. Denies itching, rash, or wounds. Denies dizziness, headaches, numbness or seizures. Denies swollen lymph nodes or glands, denies easy bruising or bleeding. Denies depression, confusion, or decreased concentration.  Physical Exam: BP (!) 157/87 (BP Location: Left Arm, Patient Position: Sitting) Comment: RN notified  Pulse 78   Temp (!) 96.8 F (36 C) (Tympanic)   Resp 20   Ht 5' 1.75" (1.568 m)   Wt 148 lb (67.1 kg)   SpO2 95% Comment: RA  BMI 27.29 kg/m  General: Alert, oriented, no acute distress. HEENT: Normocephalic, atraumatic,  sclera anicteric. Chest: Unlabored breathing on room air. Abdomen: soft, nontender.  Normoactive bowel sounds.  No masses or hepatosplenomegaly appreciated.  Well-healed laparoscopic incisions as well as vertical midline incision. Extremities: Grossly normal range of motion of her right leg, brace on and limited mobility of her left leg.  Warm, well perfused.  No edema bilaterally. Skin: No rashes or lesions noted. Lymphatics: No cervical, supraclavicular, or inguinal adenopathy. GU: Normal appearing external genitalia without erythema, excoriation, or lesions.  Speculum exam reveals mild atrophy and changes consistent with radiation.  No discharge, bleeding or masses noted, cuff intact.  Bimanual exam reveals cuff intact, no nodularity or masses.  Rectovaginal exam confirms findings.  Laboratory & Radiologic Studies: None new  Assessment & Plan: [name] is a 69 y.o. woman with Stage IB grade 1 endometrioid adenocarcinoma the endometrium who presents for surveillance visit approximately 3 months after completing adjuvant vaginal brachytherapy.  The patient is overall doing very well and is NED on exam today.  She has been using the medium size vaginal dilator 2 times a week with good success.  I encouraged her to continue this.  She has no evidence of agglutination of her vagina.  We use the muscular Dilaudid dose of 0.75 mg, as used during her vaginal brachytherapy treatments, with excellent response today.  We reviewed NCCN and SGO surveillance recommendations that will include every 32-month visits for at least the first year.  We also discussed signs and symptoms that would be concerning for disease recurrence, and the patient knows to call if she develops any of these sooner than her next scheduled visit.  We will plan to alternate visits between radiation oncology and my clinic.  I have reached out to Santiago Glad to help get the patient scheduled to see Dr. Sondra Come for her next visit in  December.  25 minutes of total time was spent for this patient encounter, including preparation, face-to-face counseling with the patient and coordination of care, and documentation of the encounter.  Jeral Pinch, MD  Division of Gynecologic Oncology  Department of Obstetrics and Gynecology  [employer]

## 2019-09-24 NOTE — Patient Instructions (Signed)
Your exam is normal today, no evidence of cancer.  We will continue visits every 3 months.  You will see Dr. Sondra Come your next visit and come back to see me in March.  My clinic schedule is not out past December.  Please either call the clinic in December or let them know when you come for your visit to radiation oncology to get you scheduled for follow-up in March with me.

## 2019-09-24 NOTE — Progress Notes (Signed)
IM dilaudid ordered to be given prior to pelvic exam due to significant discomfort during examination.

## 2019-09-25 ENCOUNTER — Telehealth: Payer: Self-pay | Admitting: Oncology

## 2019-09-25 NOTE — Telephone Encounter (Signed)
Call [name] with appointment to follow up with Dr. Sondra Come on 12/23/19 at 11:30.  She said she has a meeting at that time and would like to reschedule.  Moved appointment to 12/16/19 at 3:00.  She verbalized understanding of new appointment date and time.

## 2019-09-27 DIAGNOSIS — J301 Allergic rhinitis due to pollen: Secondary | ICD-10-CM | POA: Diagnosis not present

## 2019-09-27 DIAGNOSIS — J3089 Other allergic rhinitis: Secondary | ICD-10-CM | POA: Diagnosis not present

## 2019-09-30 DIAGNOSIS — J301 Allergic rhinitis due to pollen: Secondary | ICD-10-CM | POA: Diagnosis not present

## 2019-09-30 DIAGNOSIS — J3089 Other allergic rhinitis: Secondary | ICD-10-CM | POA: Diagnosis not present

## 2019-10-08 ENCOUNTER — Other Ambulatory Visit: Payer: Self-pay | Admitting: Family Medicine

## 2019-10-08 DIAGNOSIS — J301 Allergic rhinitis due to pollen: Secondary | ICD-10-CM | POA: Diagnosis not present

## 2019-10-08 DIAGNOSIS — Z0000 Encounter for general adult medical examination without abnormal findings: Secondary | ICD-10-CM

## 2019-10-09 DIAGNOSIS — J301 Allergic rhinitis due to pollen: Secondary | ICD-10-CM | POA: Diagnosis not present

## 2019-10-09 DIAGNOSIS — J3081 Allergic rhinitis due to animal (cat) (dog) hair and dander: Secondary | ICD-10-CM | POA: Diagnosis not present

## 2019-10-09 DIAGNOSIS — J3089 Other allergic rhinitis: Secondary | ICD-10-CM | POA: Diagnosis not present

## 2019-10-10 DIAGNOSIS — Z20828 Contact with and (suspected) exposure to other viral communicable diseases: Secondary | ICD-10-CM | POA: Diagnosis not present

## 2019-10-10 DIAGNOSIS — Z20822 Contact with and (suspected) exposure to covid-19: Secondary | ICD-10-CM | POA: Diagnosis not present

## 2019-10-11 DIAGNOSIS — Z23 Encounter for immunization: Secondary | ICD-10-CM | POA: Diagnosis not present

## 2019-10-17 DIAGNOSIS — J301 Allergic rhinitis due to pollen: Secondary | ICD-10-CM | POA: Diagnosis not present

## 2019-10-17 DIAGNOSIS — J3089 Other allergic rhinitis: Secondary | ICD-10-CM | POA: Diagnosis not present

## 2019-10-17 DIAGNOSIS — J3081 Allergic rhinitis due to animal (cat) (dog) hair and dander: Secondary | ICD-10-CM | POA: Diagnosis not present

## 2019-10-22 DIAGNOSIS — J301 Allergic rhinitis due to pollen: Secondary | ICD-10-CM | POA: Diagnosis not present

## 2019-10-22 DIAGNOSIS — J3089 Other allergic rhinitis: Secondary | ICD-10-CM | POA: Diagnosis not present

## 2019-10-29 DIAGNOSIS — Z23 Encounter for immunization: Secondary | ICD-10-CM | POA: Diagnosis not present

## 2019-11-01 ENCOUNTER — Ambulatory Visit: Payer: Medicare Other

## 2019-11-05 DIAGNOSIS — J301 Allergic rhinitis due to pollen: Secondary | ICD-10-CM | POA: Diagnosis not present

## 2019-11-05 DIAGNOSIS — J3089 Other allergic rhinitis: Secondary | ICD-10-CM | POA: Diagnosis not present

## 2019-11-09 ENCOUNTER — Other Ambulatory Visit: Payer: Self-pay | Admitting: Pulmonary Disease

## 2019-11-12 DIAGNOSIS — J3089 Other allergic rhinitis: Secondary | ICD-10-CM | POA: Diagnosis not present

## 2019-11-12 DIAGNOSIS — J301 Allergic rhinitis due to pollen: Secondary | ICD-10-CM | POA: Diagnosis not present

## 2019-11-14 DIAGNOSIS — J3089 Other allergic rhinitis: Secondary | ICD-10-CM | POA: Diagnosis not present

## 2019-11-14 DIAGNOSIS — J301 Allergic rhinitis due to pollen: Secondary | ICD-10-CM | POA: Diagnosis not present

## 2019-11-20 DIAGNOSIS — J301 Allergic rhinitis due to pollen: Secondary | ICD-10-CM | POA: Diagnosis not present

## 2019-11-20 DIAGNOSIS — J3089 Other allergic rhinitis: Secondary | ICD-10-CM | POA: Diagnosis not present

## 2019-11-22 DIAGNOSIS — J301 Allergic rhinitis due to pollen: Secondary | ICD-10-CM | POA: Diagnosis not present

## 2019-11-27 DIAGNOSIS — J3089 Other allergic rhinitis: Secondary | ICD-10-CM | POA: Diagnosis not present

## 2019-11-27 DIAGNOSIS — J301 Allergic rhinitis due to pollen: Secondary | ICD-10-CM | POA: Diagnosis not present

## 2019-12-05 DIAGNOSIS — J301 Allergic rhinitis due to pollen: Secondary | ICD-10-CM | POA: Diagnosis not present

## 2019-12-05 DIAGNOSIS — J3081 Allergic rhinitis due to animal (cat) (dog) hair and dander: Secondary | ICD-10-CM | POA: Diagnosis not present

## 2019-12-05 DIAGNOSIS — J3089 Other allergic rhinitis: Secondary | ICD-10-CM | POA: Diagnosis not present

## 2019-12-10 ENCOUNTER — Ambulatory Visit: Payer: Medicare Other

## 2019-12-12 DIAGNOSIS — J3081 Allergic rhinitis due to animal (cat) (dog) hair and dander: Secondary | ICD-10-CM | POA: Diagnosis not present

## 2019-12-12 DIAGNOSIS — J3089 Other allergic rhinitis: Secondary | ICD-10-CM | POA: Diagnosis not present

## 2019-12-12 DIAGNOSIS — J301 Allergic rhinitis due to pollen: Secondary | ICD-10-CM | POA: Diagnosis not present

## 2019-12-13 ENCOUNTER — Ambulatory Visit
Admission: RE | Admit: 2019-12-13 | Discharge: 2019-12-13 | Disposition: A | Discharge status: Home / Self Care | Payer: Medicare Other | Source: Ambulatory Visit | Attending: Family Medicine | Admitting: Family Medicine

## 2019-12-13 ENCOUNTER — Other Ambulatory Visit: Payer: Self-pay

## 2019-12-13 DIAGNOSIS — Z1231 Encounter for screening mammogram for malignant neoplasm of breast: Secondary | ICD-10-CM | POA: Diagnosis not present

## 2019-12-13 DIAGNOSIS — Z0000 Encounter for general adult medical examination without abnormal findings: Secondary | ICD-10-CM

## 2019-12-16 ENCOUNTER — Other Ambulatory Visit: Payer: Self-pay

## 2019-12-16 ENCOUNTER — Encounter: Payer: Self-pay | Admitting: Radiation Oncology

## 2019-12-16 ENCOUNTER — Ambulatory Visit
Admission: RE | Admit: 2019-12-16 | Discharge: 2019-12-16 | Disposition: A | Discharge status: Home / Self Care | Payer: Medicare Other | Source: Ambulatory Visit | Attending: Radiation Oncology | Admitting: Radiation Oncology

## 2019-12-16 VITALS — BP 141/82 | HR 90 | Temp 97.2°F | Resp 18 | Ht 61.75 in | Wt 147.8 lb

## 2019-12-16 DIAGNOSIS — Z8542 Personal history of malignant neoplasm of other parts of uterus: Secondary | ICD-10-CM | POA: Insufficient documentation

## 2019-12-16 DIAGNOSIS — C541 Malignant neoplasm of endometrium: Secondary | ICD-10-CM | POA: Diagnosis not present

## 2019-12-16 DIAGNOSIS — Z79899 Other long term (current) drug therapy: Secondary | ICD-10-CM | POA: Insufficient documentation

## 2019-12-16 DIAGNOSIS — Z923 Personal history of irradiation: Secondary | ICD-10-CM | POA: Diagnosis not present

## 2019-12-16 DIAGNOSIS — Z08 Encounter for follow-up examination after completed treatment for malignant neoplasm: Secondary | ICD-10-CM | POA: Diagnosis not present

## 2019-12-16 MED ORDER — HYDROMORPHONE HCL 1 MG/ML IJ SOLN
0.7500 mg | Freq: Once | INTRAMUSCULAR | Status: AC
  Administered 2019-12-16: 0.75 mg via INTRAMUSCULAR
  Filled 2019-12-16: qty 1

## 2019-12-16 MED ORDER — ONDANSETRON HCL 4 MG PO TABS
4.0000 mg | ORAL_TABLET | Freq: Once | ORAL | Status: AC
  Administered 2019-12-16: 4 mg via ORAL
  Filled 2019-12-16: qty 1

## 2019-12-16 NOTE — Progress Notes (Addendum)
Post radiation times six months. Patient refuses pelvic exam without dilaudid 0.75 mg prior to exam. States she is unable to stand the pain. States she has a driver but no one is with her at this time. Denies any vaginal bleeding. Patient states she is using her dilators without any issues. Denies any issues with her bowels or her bladder. Spoke with patiwents husband he is with her and is driving her home. ).75mg  Dilaudid given Left deltoid. Patient complained of nausea 4 mg Zofran given. Discharged via wheel chair with her husband.

## 2019-12-16 NOTE — Progress Notes (Signed)
Radiation Oncology         [phone] ________________________________  Name: [name] MRN: [id number]  Date: 12/16/2019  DOB: 1950-03-27  Follow-Up Visit Note  CC: Maurice Small, MD  Maurice Small, MD    ICD-10-CM   1. Endometrial cancer (HCC)  C54.1 HYDROmorphone (DILAUDID) injection 0.75 mg    ondansetron (ZOFRAN) tablet 4 mg    Diagnosis: FIGO stage IB (cT1b, cN0, cM0) endometrioid endometrial adenocarcinoma, grade 1  Interval Since Last Radiation: Six months, one week, and five days  Radiation Treatment Dates: 05/06/2019 through 06/04/2019 Site Technique Total Dose (Gy) Dose per Fx (Gy) Completed Fx Beam Energies  Vagina: Pelvis HDR-brachy 30/30 6 5/5 Ir-192    Narrative:  The patient returns today for routine follow-up. Since her last visit, she was seen by Dr. Berline Lopes on 09/24/2019. There was no evidence of disease on examination at that time.   On review of systems, she reports feeling well. She denies vaginal bleeding.  She denies any pelvic pain or abdominal bloating.  She denies any hematuria or rectal bleeding.  She continues to use her vaginal dilator..  ALLERGIES:  is allergic to molds & smuts and latex.  Meds: Current Outpatient Medications  Medication Sig Dispense Refill   albuterol (PROVENTIL) (2.5 MG/3ML) 0.083% nebulizer solution Take 3 mLs (2.5 mg total) by nebulization every 6 (six) hours as needed for wheezing or shortness of breath. (Patient not taking: Reported on 04/09/2019) 75 vial 5   alendronate (FOSAMAX) 70 MG tablet Take 70 mg by mouth once a week.     Ca Phosphate-Cholecalciferol (CALCIUM/VITAMIN D3 GUMMIES) 250-350 MG-UNIT CHEW Chew 3 tablets by mouth daily.     CANNABIDIOL PO Take by mouth at bedtime. CBD gymmies     cholecalciferol (VITAMIN D3) 25 MCG (1000 UNIT) tablet Take by mouth once.     Cyanocobalamin (VITAMIN B-12 CR) 1500 MCG TBCR Take 1,000 mcg by mouth daily.      EPINEPHrine 0.3 mg/0.3 mL IJ SOAJ injection SMARTSIG:Injection  IM As Directed     loratadine (CLARITIN) 10 MG tablet Take 10 mg by mouth daily.     losartan-hydrochlorothiazide (HYZAAR) 100-25 MG per tablet Take 1 tablet by mouth daily.     montelukast (SINGULAIR) 10 MG tablet TAKE ONE TABLET AT BEDTIME. 30 tablet 1   Multiple Vitamin (MULTIVITAMIN WITH MINERALS) TABS tablet Take 1 tablet by mouth daily.     omeprazole (PRILOSEC) 20 MG capsule TAKE 1 CAPSULE EVERY DAY. 30 capsule 4   SYMBICORT 80-4.5 MCG/ACT inhaler USE 2 PUFFS TWICE A DAY. 10.2 g 12   No current facility-administered medications for this encounter.    Physical Findings: The patient is in no acute distress. Patient is alert and oriented.  height is 5' 1.75" (1.568 m) and weight is 147 lb 12.8 oz (67 kg). Her temporal temperature is 97.2 F (36.2 C) (abnormal). Her blood pressure is 141/82 (abnormal) and her pulse is 90. Her respiration is 18 and oxygen saturation is 96%.   Lungs are clear to auscultation bilaterally. Heart has regular rate and rhythm. No palpable cervical, supraclavicular, or axillary adenopathy. Abdomen soft, non-tender, normal bowel sounds.  On pelvic examination the external genitalia are unremarkable.  Speculum exam was performed.  There are no mucosal lesions noted in the vaginal vault.  Minimal yellowish discharge noted in the proximal vagina. On  Bimanual examination vaginal cuff is intact.  No pelvic masses appreciated.  Exam was performed with the assistance of 0.75 mg of Dilaudid  IM.  Patient was also given 4 mg of Zofran orally since she occasionally will have nausea with Dilaudid.  The patient's husband will be driving her home in light of her Dilaudid administration.  This was the regimen we used for her HDR treatments and seemed to make the exam much more tolerable for the patient  Lab Findings: Lab Results  Component Value Date   WBC 6.5 03/18/2019   HGB 14.7 03/18/2019   HCT 43.3 03/18/2019   MCV 96.0 03/18/2019   PLT 371 03/18/2019     Radiographic Findings: No results found.  Impression: FIGO stage IB (cT1b, cN0, cM0) endometrioid endometrial adenocarcinoma, grade 1  No evidence of recurrence on clinical exam today.  Patient has no lasting effects from her vaginal brachytherapy.  Plan: The patient will follow-up with Dr. Berline Lopes in three months and with radiation oncology in six months.   Total time spent in this encounter was 25 minutes which included reviewing the patient's most recent follow-up with Dr. Berline Lopes, physical examination, medicine administration and documentation.  ____________________________________   Blair Promise, PhD, MD  This document serves as a record of services personally performed by Gery Pray, MD. It was created on his behalf by Clerance Lav, a trained medical scribe. The creation of this record is based on the scribe's personal observations and the provider's statements to them. This document has been checked and approved by the attending provider.

## 2019-12-18 ENCOUNTER — Other Ambulatory Visit: Payer: Self-pay | Admitting: Family Medicine

## 2019-12-18 DIAGNOSIS — R928 Other abnormal and inconclusive findings on diagnostic imaging of breast: Secondary | ICD-10-CM

## 2019-12-23 ENCOUNTER — Ambulatory Visit: Payer: Self-pay | Admitting: Radiation Oncology

## 2019-12-24 DIAGNOSIS — J3089 Other allergic rhinitis: Secondary | ICD-10-CM | POA: Diagnosis not present

## 2019-12-24 DIAGNOSIS — J301 Allergic rhinitis due to pollen: Secondary | ICD-10-CM | POA: Diagnosis not present

## 2019-12-31 DIAGNOSIS — J3089 Other allergic rhinitis: Secondary | ICD-10-CM | POA: Diagnosis not present

## 2019-12-31 DIAGNOSIS — J301 Allergic rhinitis due to pollen: Secondary | ICD-10-CM | POA: Diagnosis not present

## 2020-01-14 ENCOUNTER — Other Ambulatory Visit: Payer: Self-pay | Admitting: Pulmonary Disease

## 2020-01-14 ENCOUNTER — Telehealth: Payer: Self-pay | Admitting: *Deleted

## 2020-01-14 NOTE — Telephone Encounter (Signed)
Called the patient and left a message to call the office back. Patient needs to be scheduled for a follow up appt with Dr Tucker  

## 2020-01-15 ENCOUNTER — Other Ambulatory Visit: Payer: Self-pay | Admitting: Gynecologic Oncology

## 2020-01-15 ENCOUNTER — Telehealth: Payer: Self-pay | Admitting: *Deleted

## 2020-01-15 DIAGNOSIS — J301 Allergic rhinitis due to pollen: Secondary | ICD-10-CM | POA: Diagnosis not present

## 2020-01-15 DIAGNOSIS — G8929 Other chronic pain: Secondary | ICD-10-CM

## 2020-01-15 DIAGNOSIS — C541 Malignant neoplasm of endometrium: Secondary | ICD-10-CM

## 2020-01-15 DIAGNOSIS — J3089 Other allergic rhinitis: Secondary | ICD-10-CM | POA: Diagnosis not present

## 2020-01-15 DIAGNOSIS — R102 Pelvic and perineal pain: Secondary | ICD-10-CM

## 2020-01-15 NOTE — Progress Notes (Signed)
IM dilaudid ordered to be given prior to her appt on Jan 21 with Dr. Berline Lopes.

## 2020-01-15 NOTE — Telephone Encounter (Signed)
Called the patient and left a message to call the office back. Patient needs to be scheduled for a follow up appt

## 2020-01-16 ENCOUNTER — Other Ambulatory Visit: Payer: Self-pay | Admitting: Family Medicine

## 2020-01-16 ENCOUNTER — Ambulatory Visit
Admission: RE | Admit: 2020-01-16 | Discharge: 2020-01-16 | Disposition: A | Discharge status: Home / Self Care | Payer: Medicare Other | Source: Ambulatory Visit | Attending: Family Medicine | Admitting: Family Medicine

## 2020-01-16 ENCOUNTER — Other Ambulatory Visit: Payer: Self-pay

## 2020-01-16 DIAGNOSIS — R928 Other abnormal and inconclusive findings on diagnostic imaging of breast: Secondary | ICD-10-CM

## 2020-01-16 DIAGNOSIS — R590 Localized enlarged lymph nodes: Secondary | ICD-10-CM | POA: Diagnosis not present

## 2020-01-21 ENCOUNTER — Other Ambulatory Visit: Payer: Self-pay | Admitting: Pulmonary Disease

## 2020-01-23 ENCOUNTER — Encounter: Payer: Self-pay | Admitting: Gynecologic Oncology

## 2020-01-23 ENCOUNTER — Telehealth: Payer: Self-pay | Admitting: Pulmonary Disease

## 2020-01-23 NOTE — Telephone Encounter (Signed)
ATC Patient.  LM to call back.  Patient LOV was 01/09/19, with Brian,NP. Patient needs OV for future refills.

## 2020-01-24 ENCOUNTER — Inpatient Hospital Stay: Payer: Medicare Other | Admitting: Gynecologic Oncology

## 2020-01-24 DIAGNOSIS — J3089 Other allergic rhinitis: Secondary | ICD-10-CM | POA: Diagnosis not present

## 2020-01-24 DIAGNOSIS — C541 Malignant neoplasm of endometrium: Secondary | ICD-10-CM

## 2020-01-24 DIAGNOSIS — J301 Allergic rhinitis due to pollen: Secondary | ICD-10-CM | POA: Diagnosis not present

## 2020-01-24 DIAGNOSIS — J3081 Allergic rhinitis due to animal (cat) (dog) hair and dander: Secondary | ICD-10-CM | POA: Diagnosis not present

## 2020-01-24 MED ORDER — MONTELUKAST SODIUM 10 MG PO TABS: 10.0000 mg | ORAL_TABLET | Freq: Every day | ORAL | 1 refills | Status: DC

## 2020-01-24 NOTE — Telephone Encounter (Signed)
Spoke with patient. She was requesting a refill on her montelukast until her appt with RA. I have sent to this [facility]. She is aware of this. Nothing further needed at time of call.

## 2020-01-24 NOTE — Telephone Encounter (Signed)
Pt calling because RX wasn't sent to pharmacy.the pt has appt scheduled for 2/25 pt uses [facility] --[phone]

## 2020-01-27 DIAGNOSIS — J301 Allergic rhinitis due to pollen: Secondary | ICD-10-CM | POA: Diagnosis not present

## 2020-01-27 DIAGNOSIS — J3089 Other allergic rhinitis: Secondary | ICD-10-CM | POA: Diagnosis not present

## 2020-01-30 DIAGNOSIS — H2513 Age-related nuclear cataract, bilateral: Secondary | ICD-10-CM | POA: Diagnosis not present

## 2020-01-30 DIAGNOSIS — H35361 Drusen (degenerative) of macula, right eye: Secondary | ICD-10-CM | POA: Diagnosis not present

## 2020-01-30 DIAGNOSIS — H40033 Anatomical narrow angle, bilateral: Secondary | ICD-10-CM | POA: Diagnosis not present

## 2020-01-30 DIAGNOSIS — D3132 Benign neoplasm of left choroid: Secondary | ICD-10-CM | POA: Diagnosis not present

## 2020-01-30 DIAGNOSIS — H40013 Open angle with borderline findings, low risk, bilateral: Secondary | ICD-10-CM | POA: Diagnosis not present

## 2020-01-30 DIAGNOSIS — H35033 Hypertensive retinopathy, bilateral: Secondary | ICD-10-CM | POA: Diagnosis not present

## 2020-01-31 DIAGNOSIS — J3089 Other allergic rhinitis: Secondary | ICD-10-CM | POA: Diagnosis not present

## 2020-02-03 DIAGNOSIS — J301 Allergic rhinitis due to pollen: Secondary | ICD-10-CM | POA: Diagnosis not present

## 2020-02-03 DIAGNOSIS — J3089 Other allergic rhinitis: Secondary | ICD-10-CM | POA: Diagnosis not present

## 2020-02-03 NOTE — Progress Notes (Signed)
This encounter was created in error - please disregard.

## 2020-02-05 ENCOUNTER — Telehealth: Payer: Self-pay | Admitting: Genetic Counselor

## 2020-02-05 NOTE — Telephone Encounter (Signed)
Received a genetic counseling referral from Dr. Justin Mend for fhx of brca. [name] has been cld and scheduled to see [name] on 2/7 at 10am. Pt aware to arrive 15 minutes early.

## 2020-02-10 ENCOUNTER — Inpatient Hospital Stay: Payer: Medicare Other

## 2020-02-10 ENCOUNTER — Telehealth: Payer: Self-pay | Admitting: Genetic Counselor

## 2020-02-10 ENCOUNTER — Inpatient Hospital Stay: Payer: Medicare Other | Admitting: Genetic Counselor

## 2020-02-10 NOTE — Telephone Encounter (Signed)
Ms. Smeal cld to reschedule her genetic counseling appt w/Karen to 2/14 at 10am.

## 2020-02-11 ENCOUNTER — Other Ambulatory Visit: Payer: Self-pay | Admitting: Pulmonary Disease

## 2020-02-17 ENCOUNTER — Inpatient Hospital Stay: Payer: Medicare Other

## 2020-02-17 ENCOUNTER — Other Ambulatory Visit: Payer: Self-pay

## 2020-02-17 ENCOUNTER — Inpatient Hospital Stay: Payer: Medicare Other | Attending: Genetic Counselor | Admitting: Genetic Counselor

## 2020-02-17 DIAGNOSIS — Z800 Family history of malignant neoplasm of digestive organs: Secondary | ICD-10-CM

## 2020-02-17 DIAGNOSIS — Z8051 Family history of malignant neoplasm of kidney: Secondary | ICD-10-CM | POA: Diagnosis not present

## 2020-02-17 DIAGNOSIS — C541 Malignant neoplasm of endometrium: Secondary | ICD-10-CM

## 2020-02-17 DIAGNOSIS — Z803 Family history of malignant neoplasm of breast: Secondary | ICD-10-CM

## 2020-02-17 LAB — GENETIC SCREENING ORDER

## 2020-02-19 ENCOUNTER — Encounter: Payer: Self-pay | Admitting: Genetic Counselor

## 2020-02-19 DIAGNOSIS — Z8051 Family history of malignant neoplasm of kidney: Secondary | ICD-10-CM | POA: Insufficient documentation

## 2020-02-19 DIAGNOSIS — Z800 Family history of malignant neoplasm of digestive organs: Secondary | ICD-10-CM | POA: Insufficient documentation

## 2020-02-19 DIAGNOSIS — Z803 Family history of malignant neoplasm of breast: Secondary | ICD-10-CM | POA: Insufficient documentation

## 2020-02-19 NOTE — Progress Notes (Signed)
REFERRING PROVIDER: Lafonda Mosses, MD [place],  [place] [address]  PRIMARY PROVIDER:  Maurice Small, MD  PRIMARY REASON FOR VISIT:  1. Endometrial cancer ([place])   2. Family history of breast cancer   3. Family history of colon cancer   4. Family history of kidney cancer   5. Family history of stomach cancer      HISTORY OF PRESENT ILLNESS:   [name], a 70 y.o. female, was seen for a [place] cancer genetics consultation at the request of Dr. Berline Lopes due to a personal and family history of cancer.  [name] presents to clinic today to discuss the possibility of a hereditary predisposition to cancer, genetic testing, and to further clarify her future cancer risks, as well as potential cancer risks for family members.   In March 2021, at the age of 1, [name] was diagnosed with endometrial cancer. The treatment plan included surgery. MSI and IHC testing was normal.     CANCER HISTORY:  Oncology History Overview Note  MSI-stable   Endometrial cancer ([place])  02/27/2019 Initial Biopsy   Gr1 EMC on EMB   02/27/2019 Initial Diagnosis   Endometrial cancer ([place])   03/05/2019 Imaging   Pelvic ultrasound: Uterus measures 7.9 x 5.7 x 3.7 cm with an endometrial lining of 2.9 cm.  Bilateral ovaries normal in size and shape.   03/20/2019 Surgery   [facility]sc LOA, TRH/BSO, SLN on R, left pelvic LND   03/20/2019 Cancer Staging   Staging form: Corpus Uteri - Carcinoma and Carcinosarcoma, AJCC 8th Edition - Clinical stage from 03/20/2019: FIGO Stage IB (cT1b, cN0, cM0) - Signed by Lafonda Mosses, MD on 03/27/2019   05/06/2019 - 06/04/2019 Radiation Therapy   Site Technique Total Dose (Gy) Dose per Fx (Gy) Completed Fx Beam Energies  Vagina: Pelvis HDR-brachy 30/30 6 5/5 Ir-192        RISK FACTORS:  Menarche was at age 14.  First live birth at age 75.  Ovaries intact: no.  Hysterectomy: yes.  Menopausal status: postmenopausal.  HRT use: 0 years. Colonoscopy: yes;  normal. Mammogram within the last year: yes. Number of breast biopsies: 2. Up to date with pelvic exams: yes. Any excessive radiation exposure in the past: no  Past Medical History:  Diagnosis Date  . Allergic rhinitis   . Arthritis   . Asthma   . Carpal tunnel syndrome on right   . Endometrial cancer ([place])   . Family history of breast cancer   . Family history of colon cancer   . Family history of kidney cancer   . Family history of stomach cancer   . GERD (gastroesophageal reflux disease)   . Hyperlipemia   . PMB (postmenopausal bleeding)   . Polio    Left leg  . Unspecified essential hypertension    clearance Dr Schuyler Amor with note on chart    Past Surgical History:  Procedure Laterality Date  . BREAST BIOPSY Bilateral    benign  . BREAST SURGERY     bilateral fibroid tumors removed  . CARPAL TUNNEL RELEASE     right  . CESAREAN SECTION     x 2  . COLON SURGERY     scar tissue removed  . leg surgery     bilateral, numerous  . LYMPH NODE DISSECTION N/A 03/20/2019   Procedure: LYMPH NODE DISSECTION;  Surgeon: Lafonda Mosses, MD;  Location: [facility];  Service: Gynecology;  Laterality: N/A;  . ROBOTIC  ASSISTED TOTAL HYSTERECTOMY WITH BILATERAL SALPINGO OOPHERECTOMY N/A 03/20/2019   Procedure: XI ROBOTIC ASSISTED TOTAL HYSTERECTOMY WITH BILATERAL SALPINGO OOPHORECTOMY;  Surgeon: Lafonda Mosses, MD;  Location: [facility];  Service: Gynecology;  Laterality: N/A;  . SENTINEL NODE BIOPSY  03/20/2019   Procedure: SENTINEL NODE BIOPSY;  Surgeon: Lafonda Mosses, MD;  Location: [facility];  Service: Gynecology;;  . tooth implant     titanium/ front upper right  . TOTAL HIP ARTHROPLASTY     right  . TOTAL HIP REVISION  08/15/2011   Procedure: TOTAL HIP REVISION;  Surgeon: Mauri Pole, MD;  Location: [facility];  Service: Orthopedics;  Laterality: Right;  . vocal surgery     cyst removed  . WRIST SURGERY  2009    left    Social History   Socioeconomic History  . Marital status: Married    Spouse name: Not on file  . Number of children: 2  . Years of education: Not on file  . Highest education level: Not on file  Occupational History  . 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 Smoking status: Never Smoker  . Smokeless tobacco: Never Used  Vaping Use  . Vaping Use: Never used  Substance and Sexual Activity  . Alcohol use: Yes    Alcohol/week: 2.0 standard drinks    Types: 1 Glasses of wine, 1 Cans of beer per week    Comment: beer nightly  . Drug use: No  . Sexual activity: Yes    Birth control/protection: Post-menopausal  Other Topics Concern  . Not on file  Social History Narrative  . Not on file   Social Determinants of Health   Financial Resource Strain: Not on file  Food Insecurity: Not on file  Transportation Needs: Not on file  Physical Activity: Not on file  Stress: Not on file  Social Connections: Not on file     FAMILY HISTORY:  We obtained a detailed, 4-generation family history.  Significant diagnoses are listed below: Family History  Problem Relation Age of Onset  . Kidney cancer Father 52  . CAD Father   . Bladder Cancer Father 48       ureter  . Melanoma Mother 44       STAGE 3  . Breast cancer Mother 26  . Heart attack Mother   . Colon cancer Maternal Grandfather   . Breast cancer Maternal Grandmother        meta to stomach  . Breast cancer Maternal Aunt 78        bilateral  . Stomach cancer Paternal Grandmother 4  . Breast cancer Maternal Aunt 30  . Non-Hodgkin's lymphoma Cousin   . Hodgkin's lymphoma Son     The patient has two children, her son had Hodgkin's lymphoma.  She is an only child. Both parents are deceased.   The patient's father had kidney cancer at 34 and ureter cancer at 91.  He had three sister and four brothers.  Several brothers had lung cancer and one sister  had a son with non-hodgkin's lymphoma.  The paternal grandparents died of cancer, the grandfather from lung cancer and the grandmother from stomach cancer.    The patient's mother had breast cancer at 39 and melanoma at 95.  She had four brothers and three sisters. Two sisters had bilateral breast cancer, one at 31.  The maternal grandparents both had  cancer, the grandmother had breast cancer and the grandfather had lung and colon cancer.  [name] is unaware of previous family history of genetic testing for hereditary cancer risks. Patient's maternal ancestors are of Caucasian descent, and paternal ancestors are of Caucasian descent. There is no reported Ashkenazi Jewish ancestry. There is no known consanguinity.  GENETIC COUNSELING ASSESSMENT: [name] is a 70 y.o. female with a personal and family history of cancer which is somewhat suggestive of a hereditary cancer syndrome and predisposition to cancer given the number of people in the family with cancer, the combination of cancer and young ages of onset. We, therefore, discussed and recommended the following at today's visit.   DISCUSSION: We discussed that 5 - 10% of breast cancer is hereditary, with most cases associated with BRCA mutations.  There are other genes that can be associated with hereditary breast cancer syndromes.  These include ATM, CHEK2 and PALB2.  The patient has uterine cancer, which is typically not associated with the more common hereditary breast cancer genes, but has been seen in some families with BRCA mutations.  We discussed that testing is beneficial for several reasons including knowing how to follow individuals after completing their treatment, identifying whether potential treatment options such as PARP inhibitors would be beneficial, and understand if other family members could be at risk for cancer and allow them to undergo genetic testing.   We reviewed the characteristics, features and inheritance patterns of  hereditary cancer syndromes. We also discussed genetic testing, including the appropriate family members to test, the process of testing, insurance coverage and turn-around-time for results. We discussed the implications of a negative, positive, carrier and/or variant of uncertain significant result. We recommended [name] pursue genetic testing for the CancerNext-Expanded+RNAinsight gene panel. The CancerNext-Expanded gene panel offered by [facility] and includes sequencing and rearrangement analysis for the following 77 genes: AIP, ALK, APC*, ATM*, AXIN2, BAP1, BARD1, BLM, BMPR1A, BRCA1*, BRCA2*, BRIP1*, CDC73, CDH1*, CDK4, CDKN1B, CDKN2A, CHEK2*, CTNNA1, DICER1, FANCC, FH, FLCN, GALNT12, KIF1B, LZTR1, MAX, MEN1, MET, MLH1*, MSH2*, MSH3, MSH6*, MUTYH*, NBN, NF1*, NF2, NTHL1, PALB2*, PHOX2B, PMS2*, POT1, PRKAR1A, PTCH1, PTEN*, RAD51C*, RAD51D*, RB1, RECQL, RET, SDHA, SDHAF2, SDHB, SDHC, SDHD, SMAD4, SMARCA4, SMARCB1, SMARCE1, STK11, SUFU, TMEM127, TP53*, TSC1, TSC2, VHL and XRCC2 (sequencing and deletion/duplication); EGFR, EGLN1, HOXB13, KIT, MITF, PDGFRA, POLD1, and POLE (sequencing only); EPCAM and GREM1 (deletion/duplication only). DNA and RNA analyses performed for * genes.   Based on [name]'s personal and family history of cancer, she meets medical criteria for genetic testing. Despite that she meets criteria, she may still have an out of pocket cost. We discussed that if her out of pocket cost for testing is over $100, the laboratory will call and confirm whether she wants to proceed with testing.  If the out of pocket cost of testing is less than $100 she will be billed by the genetic testing laboratory.   PLAN: After considering the risks, benefits, and limitations, [name] provided informed consent to pursue genetic testing and the blood sample was sent to [facility] for analysis of the CancerNext-Expanded+RNAinsight. Results should be available within approximately 2-3  weeks' time, at which point they will be disclosed by telephone to [name], as will any additional recommendations warranted by these results. [name] will receive a summary of her genetic counseling visit and a copy of her results once available. This information will also be available in Epic.   Lastly, we encouraged [name] to remain  in contact with cancer genetics annually so that we can continuously update the family history and inform her of any changes in cancer genetics and testing that may be of benefit for this family.   [name] questions were answered to her satisfaction today. Our contact information was provided should additional questions or concerns arise. Thank you for the referral and allowing [place] to share in the care of your patient.   Karen P. Florene Glen, [place], [facility] Licensed, Insurance risk surveyor [name] phone: [phone]  The patient was seen for a total of 45 minutes in face-to-face genetic counseling.  This patient was discussed with Drs. Magrinat, Lindi Adie and/or Burr Medico who agrees with the above.    _______________________________________________________________________ For Office Staff:  Number of people involved in session: 1 Was an Intern/ student involved with case: no

## 2020-02-20 DIAGNOSIS — J301 Allergic rhinitis due to pollen: Secondary | ICD-10-CM | POA: Diagnosis not present

## 2020-02-20 DIAGNOSIS — J3089 Other allergic rhinitis: Secondary | ICD-10-CM | POA: Diagnosis not present

## 2020-02-28 ENCOUNTER — Encounter: Payer: Self-pay | Admitting: Pulmonary Disease

## 2020-02-28 ENCOUNTER — Ambulatory Visit (INDEPENDENT_AMBULATORY_CARE_PROVIDER_SITE_OTHER): Payer: Medicare Other | Admitting: Pulmonary Disease

## 2020-02-28 DIAGNOSIS — J4540 Moderate persistent asthma, uncomplicated: Secondary | ICD-10-CM

## 2020-02-28 DIAGNOSIS — K219 Gastro-esophageal reflux disease without esophagitis: Secondary | ICD-10-CM

## 2020-02-28 NOTE — Patient Instructions (Signed)
Take omeprazole around lunch time OK to take symbicort as needed

## 2020-02-28 NOTE — Progress Notes (Signed)
   Subjective:    Patient ID: [name], female    DOB: 01-02-1951, 70 y.o.   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 She was on Advair for more than 12 years, in 2018 we changed to Symbicort 80    She remains on allergy shots( sharma).  He wanted her to continue on Singulair, she is convinced that this is causing reflux. Takes omeprazole in the morning and this seems to help.  Overall she has done well during the pandemic, no hospitalization or acute visits although she has had several issues with the health of her husband who has essential tremors and underwent deep brain stimulation and her mother passed away  Underwent TAH for uterine ca   Significant tests/ events Spirometry 2010 normal CT sinuses 09/2013 normal  Review of Systems neg for any significant sore throat, dysphagia, itching, sneezing, nasal congestion or excess/ purulent secretions, fever, chills, sweats, unintended wt loss, pleuritic or exertional cp, hempoptysis, orthopnea pnd or change in chronic leg swelling. Also denies presyncope, palpitations, heartburn, abdominal pain, nausea, vomiting, diarrhea or change in bowel or urinary habits, dysuria,hematuria, rash, arthralgias, visual complaints, headache, numbness weakness or ataxia.     Objective:   Physical Exam  Gen. Pleasant, well-nourished, in no distress ENT - no thrush, no pallor/icterus,no post nasal drip Neck: No JVD, no thyromegaly, no carotid bruits Lungs: no use of accessory muscles, no dullness to percussion, clear without rales or rhonchi  Cardiovascular: Rhythm regular, heart sounds  normal, no murmurs or gallops, no peripheral edema Musculoskeletal: No deformities, no cyanosis or clubbing         Assessment & Plan:

## 2020-02-28 NOTE — Assessment & Plan Note (Signed)
I would reclassify her has mild persistent asthma and SH can stepdown therapy.  I have asked her to take Symbicort on an as-needed basis she will continue using this once daily.  She'll continue on Singulair nightly

## 2020-02-28 NOTE — Assessment & Plan Note (Signed)
Reflux does seem to be contributing to her symptoms and she'll continue on PPI.  She has now been on PPI for long-term but clearly benefit outweighs risk of side effects

## 2020-03-02 ENCOUNTER — Other Ambulatory Visit: Payer: Self-pay

## 2020-03-02 ENCOUNTER — Ambulatory Visit
Admission: RE | Admit: 2020-03-02 | Discharge: 2020-03-02 | Disposition: A | Discharge status: Home / Self Care | Payer: Medicare Other | Source: Ambulatory Visit | Attending: Family Medicine | Admitting: Family Medicine

## 2020-03-02 DIAGNOSIS — R928 Other abnormal and inconclusive findings on diagnostic imaging of breast: Secondary | ICD-10-CM

## 2020-03-02 DIAGNOSIS — R590 Localized enlarged lymph nodes: Secondary | ICD-10-CM | POA: Diagnosis not present

## 2020-03-06 ENCOUNTER — Telehealth: Payer: Self-pay | Admitting: *Deleted

## 2020-03-06 ENCOUNTER — Other Ambulatory Visit: Payer: Self-pay

## 2020-03-06 ENCOUNTER — Ambulatory Visit (AMBULATORY_SURGERY_CENTER): Payer: Medicare Other | Admitting: *Deleted

## 2020-03-06 VITALS — Ht 62.0 in | Wt 151.0 lb

## 2020-03-06 DIAGNOSIS — Z1211 Encounter for screening for malignant neoplasm of colon: Secondary | ICD-10-CM

## 2020-03-06 DIAGNOSIS — J4530 Mild persistent asthma, uncomplicated: Secondary | ICD-10-CM | POA: Diagnosis not present

## 2020-03-06 DIAGNOSIS — J3089 Other allergic rhinitis: Secondary | ICD-10-CM | POA: Diagnosis not present

## 2020-03-06 DIAGNOSIS — J301 Allergic rhinitis due to pollen: Secondary | ICD-10-CM | POA: Diagnosis not present

## 2020-03-06 DIAGNOSIS — K219 Gastro-esophageal reflux disease without esophagitis: Secondary | ICD-10-CM | POA: Diagnosis not present

## 2020-03-06 NOTE — Telephone Encounter (Signed)
Attempted virtual pre-visit , no answer. LVM to call and reschedule.

## 2020-03-06 NOTE — Progress Notes (Signed)
No egg or soy allergy known to patient  No issues with past sedation with any surgeries or procedures No intubation problems in the past  No FH of Malignant Hyperthermia No diet pills per patient No home 02 use per patient  No blood thinners per patient  Pt denies issues with constipation  No A fib or A flutter  EMMI video to pt or via [place] 19 guidelines implemented in PV today with Pt and RN  Pt is fully vaccinated  for Covid    Coupon given to pt in PV today , Code to Pharmacy and  NO PA's for preps discussed with pt In PV today  Discussed with pt there will be an out-of-pocket cost for prep and that varies from $0 to 70 dollars   Due to the COVID-19 pandemic we are asking patients to follow certain guidelines.  Pt aware of COVID protocols and [facility] guidelines

## 2020-03-10 ENCOUNTER — Telehealth: Payer: Self-pay | Admitting: Genetic Counselor

## 2020-03-10 ENCOUNTER — Encounter: Payer: Self-pay | Admitting: Genetic Counselor

## 2020-03-10 DIAGNOSIS — Z1379 Encounter for other screening for genetic and chromosomal anomalies: Secondary | ICD-10-CM | POA: Insufficient documentation

## 2020-03-10 NOTE — Telephone Encounter (Signed)
LM on VM that results are back and to please call. 

## 2020-03-11 ENCOUNTER — Other Ambulatory Visit: Payer: Self-pay | Admitting: Gynecologic Oncology

## 2020-03-11 DIAGNOSIS — R110 Nausea: Secondary | ICD-10-CM

## 2020-03-12 ENCOUNTER — Inpatient Hospital Stay: Payer: Medicare Other | Attending: Genetic Counselor | Admitting: Gynecologic Oncology

## 2020-03-12 ENCOUNTER — Telehealth: Payer: Self-pay

## 2020-03-12 ENCOUNTER — Inpatient Hospital Stay: Payer: Medicare Other

## 2020-03-12 NOTE — Telephone Encounter (Signed)
03/11/2020-TC to patient regarding coordination of pain medication injection and po zofran for appointment 03/12/2020.  No answer, left detailed message regarding appointment coordination for 03/12/2020.

## 2020-03-13 ENCOUNTER — Telehealth: Payer: Self-pay

## 2020-03-13 ENCOUNTER — Other Ambulatory Visit: Payer: Self-pay | Admitting: Pulmonary Disease

## 2020-03-13 NOTE — Telephone Encounter (Signed)
LM on VM that results are back and to please call. 

## 2020-03-13 NOTE — Telephone Encounter (Signed)
Ms Bolick said that her husband has been in [place] since Tuesday 03-10-20 with heart issues and she lost track of time. She apologies for missing the appointment. She will call back to the office to r/s as she does not have her calendar with heart this time.

## 2020-03-15 ENCOUNTER — Other Ambulatory Visit: Payer: Self-pay | Admitting: Pulmonary Disease

## 2020-03-16 NOTE — Telephone Encounter (Signed)
LM on VM that results are back and to please call. 

## 2020-03-19 DIAGNOSIS — J301 Allergic rhinitis due to pollen: Secondary | ICD-10-CM | POA: Diagnosis not present

## 2020-03-19 DIAGNOSIS — J3089 Other allergic rhinitis: Secondary | ICD-10-CM | POA: Diagnosis not present

## 2020-03-20 ENCOUNTER — Encounter: Payer: Self-pay | Admitting: Genetic Counselor

## 2020-03-20 ENCOUNTER — Encounter: Payer: Self-pay | Admitting: Gastroenterology

## 2020-03-20 ENCOUNTER — Other Ambulatory Visit: Payer: Self-pay

## 2020-03-20 ENCOUNTER — Ambulatory Visit (AMBULATORY_SURGERY_CENTER): Payer: Medicare Other | Admitting: Gastroenterology

## 2020-03-20 VITALS — BP 134/82 | HR 64 | Temp 97.1°F | Resp 16 | Ht 62.0 in | Wt 151.0 lb

## 2020-03-20 DIAGNOSIS — Z1211 Encounter for screening for malignant neoplasm of colon: Secondary | ICD-10-CM

## 2020-03-20 DIAGNOSIS — K635 Polyp of colon: Secondary | ICD-10-CM | POA: Diagnosis not present

## 2020-03-20 DIAGNOSIS — D123 Benign neoplasm of transverse colon: Secondary | ICD-10-CM | POA: Diagnosis not present

## 2020-03-20 MED ORDER — SODIUM CHLORIDE 0.9 % IV SOLN
500.0000 mL | Freq: Once | INTRAVENOUS | Status: DC
Start: 2020-03-20 — End: 2020-03-20

## 2020-03-20 NOTE — Progress Notes (Signed)
Called to room to assist during endoscopic procedure.  Patient ID and intended procedure confirmed with present staff. Received instructions for my participation in the procedure from the performing physician.  

## 2020-03-20 NOTE — Patient Instructions (Signed)
Handouts on hemorrhoids, polyps, and high fiber diet given to you today  Await pathology results    YOU HAD AN ENDOSCOPIC PROCEDURE TODAY AT [facility]:   Refer to the procedure report that was given to you for any specific questions about what was found during the examination.  If the procedure report does not answer your questions, please call your gastroenterologist to clarify.  If you requested that your care partner not be given the details of your procedure findings, then the procedure report has been included in a sealed envelope for you to review at your convenience later.  YOU SHOULD EXPECT: Some feelings of bloating in the abdomen. Passage of more gas than usual.  Walking can help get rid of the air that was put into your GI tract during the procedure and reduce the bloating. If you had a lower endoscopy (such as a colonoscopy or flexible sigmoidoscopy) you may notice spotting of blood in your stool or on the toilet paper. If you underwent a bowel prep for your procedure, you may not have a normal bowel movement for a few days.  Please Note:  You might notice some irritation and congestion in your nose or some drainage.  This is from the oxygen used during your procedure.  There is no need for concern and it should clear up in a day or so.  SYMPTOMS TO REPORT IMMEDIATELY:   Following lower endoscopy (colonoscopy or flexible sigmoidoscopy):  Excessive amounts of blood in the stool  Significant tenderness or worsening of abdominal pains  Swelling of the abdomen that is new, acute  Fever of 100F or higher  For urgent or emergent issues, a gastroenterologist can be reached at any hour by calling [phone]. Do not use MyChart messaging for urgent concerns.    DIET:  We do recommend a small meal at first, but then you may proceed to your regular diet.  Drink plenty of fluids but you should avoid alcoholic beverages for 24 hours.  ACTIVITY:  You should plan to take  it easy for the rest of today and you should NOT DRIVE or use heavy machinery until tomorrow (because of the sedation medicines used during the test).    FOLLOW UP: Our staff will call the number listed on your records 48-72 hours following your procedure to check on you and address any questions or concerns that you may have regarding the information given to you following your procedure. If we do not reach you, we will leave a message.  We will attempt to reach you two times.  During this call, we will ask if you have developed any symptoms of COVID 19. If you develop any symptoms (ie: fever, flu-like symptoms, shortness of breath, cough etc.) before then, please call [phone].  If you test positive for Covid 19 in the 2 weeks post procedure, please call and report this information to [place].    If any biopsies were taken you will be contacted by phone or by letter within the next 1-3 weeks.  Please call us at [phone] if you have not heard about the biopsies in 3 weeks.    SIGNATURES/CONFIDENTIALITY: You and/or your care partner have signed paperwork which will be entered into your electronic medical record.  These signatures attest to the fact that that the information above on your After Visit Summary has been reviewed and is understood.  Full responsibility of the confidentiality of this discharge information lies with you and/or your care-partner.

## 2020-03-20 NOTE — Op Note (Signed)
[place] Patient Name: [name] Procedure Date: 03/20/2020 11:47 AM MRN: [id number] Endoscopist: Justice Britain , MD Age: 70 Referring MD:  Date of Birth: 06/14/1950 Gender: Female Account #: [id number] Procedure:                Colonoscopy Indications:              Screening for colorectal malignant neoplasm Medicines:                Monitored Anesthesia Care Procedure:                Pre-Anesthesia Assessment:                           - Prior to the procedure, a History and Physical                            was performed, and patient medications and                            allergies were reviewed. The patient's tolerance of                            previous anesthesia was also reviewed. The risks                            and benefits of the procedure and the sedation                            options and risks were discussed with the patient.                            All questions were answered, and informed consent                            was obtained. Prior Anticoagulants: The patient has                            taken no previous anticoagulant or antiplatelet                            agents. ASA Grade Assessment: III - A patient with                            severe systemic disease. After reviewing the risks                            and benefits, the patient was deemed in                            satisfactory condition to undergo the procedure.                           After obtaining informed consent, the colonoscope  was passed under direct vision. Throughout the                            procedure, the patient's blood pressure, pulse, and                            oxygen saturations were monitored continuously. The                            Olympus PCF-H190DL (#[id number]) Colonoscope was                            introduced through the anus and advanced to the 5                            cm into the  ileum. The colonoscopy was performed                            without difficulty. The patient tolerated the                            procedure. Scope In: 11:59:14 AM Scope Out: 12:19:30 PM Scope Withdrawal Time: 0 hours 13 minutes 21 seconds  Total Procedure Duration: 0 hours 20 minutes 16 seconds  Findings:                 The digital rectal exam findings include                            hemorrhoids. Pertinent negatives include no                            palpable rectal lesions.                           The terminal ileum and ileocecal valve appeared                            normal.                           The colon (entire examined portion) was                            significantly tortuous.                           A 7 mm polyp was found in the hepatic flexure. The                            polyp was sessile. The polyp was removed with a                            cold snare. Resection and retrieval were complete.  Normal mucosa was found in the entire colon                            otherwise.                           Non-bleeding non-thrombosed external and internal                            hemorrhoids were found during retroflexion, during                            perianal exam and during digital exam. The                            hemorrhoids were Grade II (internal hemorrhoids                            that prolapse but reduce spontaneously). Complications:            No immediate complications. Estimated Blood Loss:     Estimated blood loss was minimal. Impression:               - Hemorrhoids found on digital rectal exam.                           - The examined portion of the ileum was normal.                           - Tortuous colon.                           - One 7 mm polyp at the hepatic flexure, removed                            with a cold snare. Resected and retrieved.                           - Normal mucosa in  the entire examined colon.                           - Non-bleeding non-thrombosed external and internal                            hemorrhoids. Recommendation:           - The patient will be observed post-procedure,                            until all discharge criteria are met.                           - Discharge patient to home.                           - Patient has a contact number available for  emergencies. The signs and symptoms of potential                            delayed complications were discussed with the                            patient. Return to normal activities tomorrow.                            Written discharge instructions were provided to the                            patient.                           - High fiber diet.                           - Use FiberCon 1-2 tablets PO daily.                           - Continue present medications.                           - Await pathology results.                           - Repeat colonoscopy in 05/09/08 years for                            screening/surveillance based on pathology results.                           - The findings and recommendations were discussed                            with the patient.                           - The findings and recommendations were discussed                            with the patient's family. Justice Britain, MD 03/20/2020 12:24:59 PM

## 2020-03-20 NOTE — Progress Notes (Signed)
pt tolerated well. VSS. awake and to recovery. Report given to RN.  

## 2020-03-20 NOTE — Progress Notes (Signed)
[name] states no medical or surgical changes since previsit or office visit. VS by CW. 

## 2020-03-20 NOTE — Telephone Encounter (Signed)
LM on VM that results are back.  I let her know that I have missed her on several occasions and that I will send a letter with my contact information so she can contact me when it is convenient for her.

## 2020-03-24 ENCOUNTER — Telehealth: Payer: Self-pay | Admitting: *Deleted

## 2020-03-24 ENCOUNTER — Telehealth: Payer: Self-pay

## 2020-03-24 NOTE — Telephone Encounter (Signed)
Attempted f/u phone call. No answer. Left message. °

## 2020-03-24 NOTE — Telephone Encounter (Signed)
Left message on follow up call. 

## 2020-03-26 ENCOUNTER — Encounter: Payer: Self-pay | Admitting: Gastroenterology

## 2020-03-27 ENCOUNTER — Telehealth: Payer: Self-pay | Admitting: *Deleted

## 2020-03-27 NOTE — Telephone Encounter (Signed)
Returned the patient's call and left a message to call the office back to reschedule

## 2020-03-30 ENCOUNTER — Telehealth: Payer: Self-pay | Admitting: *Deleted

## 2020-03-30 DIAGNOSIS — J301 Allergic rhinitis due to pollen: Secondary | ICD-10-CM | POA: Diagnosis not present

## 2020-03-30 DIAGNOSIS — J3081 Allergic rhinitis due to animal (cat) (dog) hair and dander: Secondary | ICD-10-CM | POA: Diagnosis not present

## 2020-03-30 DIAGNOSIS — J3089 Other allergic rhinitis: Secondary | ICD-10-CM | POA: Diagnosis not present

## 2020-03-30 NOTE — Telephone Encounter (Signed)
Patient called and reschedule her missed appt. Appt rescheduled to 4/8 at 3:15 pm; with an appt at 2:45 pm with the nurse for pre meds.

## 2020-04-10 ENCOUNTER — Inpatient Hospital Stay: Payer: Medicare Other | Attending: Gynecologic Oncology | Admitting: Gynecologic Oncology

## 2020-04-10 ENCOUNTER — Encounter: Payer: Self-pay | Admitting: Gynecologic Oncology

## 2020-04-10 ENCOUNTER — Other Ambulatory Visit: Payer: Self-pay

## 2020-04-10 ENCOUNTER — Inpatient Hospital Stay: Payer: Medicare Other

## 2020-04-10 DIAGNOSIS — C541 Malignant neoplasm of endometrium: Secondary | ICD-10-CM

## 2020-04-10 DIAGNOSIS — Z800 Family history of malignant neoplasm of digestive organs: Secondary | ICD-10-CM | POA: Insufficient documentation

## 2020-04-10 DIAGNOSIS — Z923 Personal history of irradiation: Secondary | ICD-10-CM | POA: Insufficient documentation

## 2020-04-10 DIAGNOSIS — Z90722 Acquired absence of ovaries, bilateral: Secondary | ICD-10-CM | POA: Diagnosis not present

## 2020-04-10 DIAGNOSIS — Z90710 Acquired absence of both cervix and uterus: Secondary | ICD-10-CM | POA: Diagnosis not present

## 2020-04-10 DIAGNOSIS — Z8249 Family history of ischemic heart disease and other diseases of the circulatory system: Secondary | ICD-10-CM | POA: Diagnosis not present

## 2020-04-10 DIAGNOSIS — Z9079 Acquired absence of other genital organ(s): Secondary | ICD-10-CM | POA: Insufficient documentation

## 2020-04-10 DIAGNOSIS — J3089 Other allergic rhinitis: Secondary | ICD-10-CM | POA: Diagnosis not present

## 2020-04-10 DIAGNOSIS — Z8542 Personal history of malignant neoplasm of other parts of uterus: Secondary | ICD-10-CM | POA: Insufficient documentation

## 2020-04-10 DIAGNOSIS — G8929 Other chronic pain: Secondary | ICD-10-CM

## 2020-04-10 DIAGNOSIS — Z8051 Family history of malignant neoplasm of kidney: Secondary | ICD-10-CM | POA: Diagnosis not present

## 2020-04-10 DIAGNOSIS — J301 Allergic rhinitis due to pollen: Secondary | ICD-10-CM | POA: Diagnosis not present

## 2020-04-10 DIAGNOSIS — Z803 Family history of malignant neoplasm of breast: Secondary | ICD-10-CM | POA: Insufficient documentation

## 2020-04-10 DIAGNOSIS — R110 Nausea: Secondary | ICD-10-CM

## 2020-04-10 MED ORDER — ONDANSETRON HCL 4 MG PO TABS
4.0000 mg | ORAL_TABLET | Freq: Once | ORAL | Status: AC
  Administered 2020-04-10: 4 mg via ORAL
  Filled 2020-04-10: qty 1

## 2020-04-10 MED ORDER — HYDROMORPHONE HCL 1 MG/ML IJ SOLN
0.7500 mg | Freq: Once | INTRAMUSCULAR | Status: AC
  Administered 2020-04-10: 0.75 mg via INTRAMUSCULAR
  Filled 2020-04-10: qty 0.75

## 2020-04-10 MED ORDER — HYDROMORPHONE HCL 1 MG/ML IJ SOLN: 0.7500 mg | Freq: Once | INTRAMUSCULAR | Status: DC

## 2020-04-10 MED ORDER — ONDANSETRON HCL 4 MG PO TABS: 4.0000 mg | ORAL_TABLET | Freq: Once | ORAL | Status: DC

## 2020-04-10 NOTE — Patient Instructions (Signed)
It is great to see you!  No evidence of cancer recurrence on your exam today.  After your next visit with radiation oncology, you will be a year out from treatment.  At that point we will switch to visits every 6 months.  That means I will see you back in December for a visit.  Please call in October or November to get a visit scheduled with me in December.  If you develop any new symptoms before then, such as vaginal bleeding or pelvic pain, please call to see me sooner.

## 2020-04-10 NOTE — Progress Notes (Signed)
Gynecologic Oncology Return Clinic Visit  04/10/2020  Reason for Visit: Surveillance visit in the setting of high intermediate risk uterine cancer  Treatment History: Oncology History Overview Note  MSI-stable   Endometrial cancer ([place])  02/27/2019 Initial Biopsy   Gr1 EMC on EMB   02/27/2019 Initial Diagnosis   Endometrial cancer ([place])   03/05/2019 Imaging   Pelvic ultrasound: Uterus measures 7.9 x 5.7 x 3.7 cm with an endometrial lining of 2.9 cm.  Bilateral ovaries normal in size and shape.   03/20/2019 Surgery   Lsc LOA, TRH/BSO, SLN on R, left pelvic LND   03/20/2019 Cancer Staging   Staging form: Corpus Uteri - Carcinoma and Carcinosarcoma, AJCC 8th Edition - Clinical stage from 03/20/2019: FIGO Stage IB (cT1b, cN0, cM0) - Signed by Lafonda Mosses, MD on 03/27/2019   05/06/2019 - 06/04/2019 Radiation Therapy   Site Technique Total Dose (Gy) Dose per Fx (Gy) Completed Fx Beam Energies  Vagina: Pelvis HDR-brachy 30/30 6 5/5 Ir-192     03/09/2020 Genetic Testing   Negative genetic testing on the CancerNext-Expanded+RNAinsight panel.  The CancerNext-Expanded gene panel offered by [facility] and includes sequencing and rearrangement analysis for the following 77 genes: AIP, ALK, APC*, ATM*, AXIN2, BAP1, BARD1, BLM, BMPR1A, BRCA1*, BRCA2*, BRIP1*, CDC73, CDH1*, CDK4, CDKN1B, CDKN2A, CHEK2*, CTNNA1, DICER1, FANCC, FH, FLCN, GALNT12, KIF1B, LZTR1, MAX, MEN1, MET, MLH1*, MSH2*, MSH3, MSH6*, MUTYH*, NBN, NF1*, NF2, NTHL1, PALB2*, PHOX2B, PMS2*, POT1, PRKAR1A, PTCH1, PTEN*, RAD51C*, RAD51D*, RB1, RECQL, RET, SDHA, SDHAF2, SDHB, SDHC, SDHD, SMAD4, SMARCA4, SMARCB1, SMARCE1, STK11, SUFU, TMEM127, TP53*, TSC1, TSC2, VHL and XRCC2 (sequencing and deletion/duplication); EGFR, EGLN1, HOXB13, KIT, MITF, PDGFRA, POLD1, and POLE (sequencing only); EPCAM and GREM1 (deletion/duplication only). DNA and RNA analyses performed for * genes. The report date was March 09, 2020.     Interval History: Saw Dr.  Sondra Come last in December.  Was doing well at that time without any evidence of disease on exam.  She reports overall doing well since her last visit.  She denies any vaginal bleeding or discharge.  She reports good appetite without nausea or emesis.  She has had some difficulty with her bowel function after her recent colonoscopy several weeks ago.  She denies any bladder symptoms.  There is been significant stress at home because her husband has had a recent hospitalization in the last month with an extensive cardiac work-up.  They are meeting with cardiologist early next week to hear her recent test results.  Past Medical/Surgical History: Past Medical History:  Diagnosis Date  . Allergic rhinitis   . Arthritis   . Asthma   . Carpal tunnel syndrome on right   . Endometrial cancer ([place])   . Family history of breast cancer   . Family history of colon cancer   . Family history of kidney cancer   . Family history of stomach cancer   . GERD (gastroesophageal reflux disease)   . Hyperlipemia   . PMB (postmenopausal bleeding)   . Polio    Left leg  . Unspecified essential hypertension    clearance Dr Schuyler Amor with note on chart    Past Surgical History:  Procedure Laterality Date  . BREAST BIOPSY Bilateral    benign  . BREAST SURGERY     bilateral fibroid tumors removed  . CARPAL TUNNEL RELEASE     right  . CESAREAN SECTION     x 2  . COLON SURGERY     scar tissue removed  . COLONOSCOPY    .  leg surgery     bilateral, numerous  . LYMPH NODE DISSECTION N/A 03/20/2019   Procedure: LYMPH NODE DISSECTION;  Surgeon: Lafonda Mosses, MD;  Location: [facility];  Service: Gynecology;  Laterality: N/A;  . ROBOTIC ASSISTED TOTAL HYSTERECTOMY WITH BILATERAL SALPINGO OOPHERECTOMY N/A 03/20/2019   Procedure: XI ROBOTIC ASSISTED TOTAL HYSTERECTOMY WITH BILATERAL SALPINGO OOPHORECTOMY;  Surgeon: Lafonda Mosses, MD;  Location: [facility];  Service:  Gynecology;  Laterality: N/A;  . SENTINEL NODE BIOPSY  03/20/2019   Procedure: SENTINEL NODE BIOPSY;  Surgeon: Lafonda Mosses, MD;  Location: [facility];  Service: Gynecology;;  . tooth implant     titanium/ front upper right  . TOTAL HIP ARTHROPLASTY     right  . TOTAL HIP REVISION  08/15/2011   Procedure: TOTAL HIP REVISION;  Surgeon: Mauri Pole, MD;  Location: [facility];  Service: Orthopedics;  Laterality: Right;  . vocal surgery     cyst removed  . WRIST SURGERY  2009   left    Family History  Problem Relation Age of Onset  . Kidney cancer Father 55  . CAD Father   . Bladder Cancer Father 57       ureter  . Melanoma Mother 65       STAGE 3  . Breast cancer Mother 86  . Heart attack Mother   . Colon cancer Maternal Grandfather   . Breast cancer Maternal Grandmother        meta to stomach  . Breast cancer Maternal Aunt 78        bilateral  . Stomach cancer Paternal Grandmother 68  . Breast cancer Maternal Aunt 32  . Non-Hodgkin's lymphoma Cousin   . Hodgkin's lymphoma Son   . Rectal cancer Neg Hx     Social History   Socioeconomic History  . Marital status: Married    Spouse name: Not on file  . Number of children: 2  . Years of education: Not on file  . Highest education level: Not on file  Occupational History  . 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 Smoking status: Never Smoker  . Smokeless tobacco: Never Used  Vaping Use  . Vaping Use: Never used  Substance and Sexual Activity  . Alcohol use: Yes    Alcohol/week: 2.0 standard drinks    Types: 1 Glasses of wine, 1 Cans of beer per week    Comment: beer nightly  . Drug use: No  . Sexual activity: Yes    Birth control/protection: Post-menopausal  Other Topics Concern  . Not on file  Social History Narrative  . Not on file   Social Determinants of Health   Financial Resource Strain: Not on file  Food  Insecurity: Not on file  Transportation Needs: Not on file  Physical Activity: Not on file  Stress: Not on file  Social Connections: Not on file    Current Medications:  Current Outpatient Medications:  .  albuterol (PROVENTIL) (2.5 MG/3ML) 0.083% nebulizer solution, Take 3 mLs (2.5 mg total) by nebulization every 6 (six) hours as needed for wheezing or shortness of breath., Disp: 75 vial, Rfl: 5 .  alendronate (FOSAMAX) 70 MG tablet, Take 70 mg by mouth once a week., Disp: , Rfl:  .  Ca Phosphate-Cholecalciferol (CALCIUM/VITAMIN D3 GUMMIES) 250-350 MG-UNIT CHEW, Chew 3 tablets by mouth daily., Disp: , Rfl:  .  CANNABIDIOL PO, Take by mouth at bedtime. CBD gymmies, Disp: , Rfl:  .  cholecalciferol (VITAMIN D3) 25 MCG (1000 UNIT) tablet, Take by mouth once., Disp: , Rfl:  .  Cyanocobalamin (VITAMIN B-12 CR) 1500 MCG TBCR, Take 1,000 mcg by mouth daily. , Disp: , Rfl:  .  EPINEPHrine 0.3 mg/0.3 mL IJ SOAJ injection, SMARTSIG:Injection IM As Directed (Patient not taking: No sig reported), Disp: , Rfl:  .  loratadine (CLARITIN) 10 MG tablet, Take 10 mg by mouth daily., Disp: , Rfl:  .  losartan-hydrochlorothiazide (HYZAAR) 100-25 MG per tablet, Take 1 tablet by mouth daily., Disp: , Rfl:  .  montelukast (SINGULAIR) 10 MG tablet, TAKE ONE TABLET AT BEDTIME., Disp: 30 tablet, Rfl: 1 .  Multiple Vitamin (MULTIVITAMIN WITH MINERALS) TABS tablet, Take 1 tablet by mouth daily., Disp: , Rfl:  .  omeprazole (PRILOSEC) 20 MG capsule, TAKE ONE CAPSULE BY MOUTH EVERY DAY, Disp: 30 capsule, Rfl: 5 .  SYMBICORT 80-4.5 MCG/ACT inhaler, USE 2 PUFFS TWICE A DAY. (Patient taking differently: Inhale 1 puff into the lungs in the morning and at bedtime.), Disp: 10.2 g, Rfl: 12  Review of Systems: Denies appetite changes, fevers, chills, fatigue, unexplained weight changes. Denies hearing loss, neck lumps or masses, mouth sores, ringing in ears or voice changes. Denies cough or wheezing.  Denies shortness of  breath. Denies chest pain or palpitations. Denies leg swelling. Denies abdominal distention, pain, blood in stools, constipation, diarrhea, nausea, vomiting, or early satiety. Denies pain with intercourse, dysuria, frequency, hematuria or incontinence. Denies hot flashes, pelvic pain, vaginal bleeding or vaginal discharge.   Denies joint pain, back pain or muscle pain/cramps. Denies itching, rash, or wounds. Denies dizziness, headaches, numbness or seizures. Denies swollen lymph nodes or glands, denies easy bruising or bleeding. Denies anxiety, depression, confusion, or decreased concentration.  Physical Exam: Blood pressure 91/49, pulse rate 78, respiratory rate 20, oxygen saturation 94% General: Alert, oriented, no acute distress. HEENT: Normocephalic, atraumatic, sclera anicteric. Chest: Unlabored breathing on room air. Abdomen: soft, nontender.  Normoactive bowel sounds.  No masses or hepatosplenomegaly appreciated.  Well-healed upper scopic incisions. Extremities: Grossly normal range of motion although somewhat creased mobility of her left leg with a brace in place.  Warm, well perfused.  No edema bilaterally. Skin: No rashes or lesions noted. Lymphatics: No cervical, supraclavicular, or inguinal adenopathy. GU: Normal appearing external genitalia without erythema, excoriation, or lesions.  Speculum exam reveals moderately atrophic vaginal mucosa consistent with radiation changes, no lesions or masses.  Bimanual exam reveals no nodularity.  Rectovaginal exam  Confirms his exam findings.  Laboratory & Radiologic Studies: None new  Assessment & Plan: CHEQUITA MOFIELD is a 70 y.o. 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  Patient is doing well and is NED on exam today.  She is continuing to use the medium size vaginal dilator although doing this once a week now.  She was encouraged to continue doing this.  There is no  evidence of agglutination on exam.  She continues to need intramuscular Dilaudid for her exam, although she tolerated it very well without any difficulty today and likely I think we could decrease the dose or transition to an oral medication.  She seen radiation oncology in 3 months.  She will be a year out from completing treatment and at that time we can transition to visits every 6 months.  I will be due to see her in December and  she knows to call the clinic in the late fall to get that appointment scheduled with me.  We also discussed signs and symptoms that would be concerning for disease recurrence, and the patient knows to call if she develops any of these sooner than her next scheduled visit.    35 minutes of total time was spent for this patient encounter, including preparation, face-to-face counseling with the patient and coordination of care, and documentation of the encounter.  Jeral Pinch, MD  Division of Gynecologic Oncology  Department of Obstetrics and Gynecology  [facility]

## 2020-04-23 DIAGNOSIS — J301 Allergic rhinitis due to pollen: Secondary | ICD-10-CM | POA: Diagnosis not present

## 2020-04-23 DIAGNOSIS — J3089 Other allergic rhinitis: Secondary | ICD-10-CM | POA: Diagnosis not present

## 2020-04-30 DIAGNOSIS — J3089 Other allergic rhinitis: Secondary | ICD-10-CM | POA: Diagnosis not present

## 2020-04-30 DIAGNOSIS — J301 Allergic rhinitis due to pollen: Secondary | ICD-10-CM | POA: Diagnosis not present

## 2020-05-07 DIAGNOSIS — J3089 Other allergic rhinitis: Secondary | ICD-10-CM | POA: Diagnosis not present

## 2020-05-07 DIAGNOSIS — J301 Allergic rhinitis due to pollen: Secondary | ICD-10-CM | POA: Diagnosis not present

## 2020-05-08 ENCOUNTER — Telehealth: Payer: Self-pay | Admitting: Genetic Counselor

## 2020-05-08 NOTE — Telephone Encounter (Signed)
Revealed negative genetic testing.  Discussed that we do not know why she has uterine cancer or why there is cancer in the family. It could be due to a different gene that we are not testing, or maybe our current technology may not be able to pick something up.  It will be important for her to keep in contact with genetics to keep up with whether additional testing may be needed.    

## 2020-05-15 DIAGNOSIS — J301 Allergic rhinitis due to pollen: Secondary | ICD-10-CM | POA: Diagnosis not present

## 2020-05-15 DIAGNOSIS — J3089 Other allergic rhinitis: Secondary | ICD-10-CM | POA: Diagnosis not present

## 2020-05-26 DIAGNOSIS — J3089 Other allergic rhinitis: Secondary | ICD-10-CM | POA: Diagnosis not present

## 2020-05-26 DIAGNOSIS — J301 Allergic rhinitis due to pollen: Secondary | ICD-10-CM | POA: Diagnosis not present

## 2020-06-04 DIAGNOSIS — J301 Allergic rhinitis due to pollen: Secondary | ICD-10-CM | POA: Diagnosis not present

## 2020-06-04 DIAGNOSIS — J3089 Other allergic rhinitis: Secondary | ICD-10-CM | POA: Diagnosis not present

## 2020-06-12 DIAGNOSIS — J301 Allergic rhinitis due to pollen: Secondary | ICD-10-CM | POA: Diagnosis not present

## 2020-06-12 DIAGNOSIS — J3089 Other allergic rhinitis: Secondary | ICD-10-CM | POA: Diagnosis not present

## 2020-06-14 ENCOUNTER — Other Ambulatory Visit: Payer: Self-pay | Admitting: Pulmonary Disease

## 2020-06-15 ENCOUNTER — Other Ambulatory Visit: Payer: Self-pay | Admitting: Pulmonary Disease

## 2020-06-15 ENCOUNTER — Other Ambulatory Visit: Payer: Self-pay

## 2020-06-15 NOTE — Progress Notes (Signed)
Erroneous encounter

## 2020-06-19 ENCOUNTER — Encounter: Payer: Self-pay | Admitting: Radiology

## 2020-06-24 DIAGNOSIS — J3089 Other allergic rhinitis: Secondary | ICD-10-CM | POA: Diagnosis not present

## 2020-06-24 DIAGNOSIS — J301 Allergic rhinitis due to pollen: Secondary | ICD-10-CM | POA: Diagnosis not present

## 2020-06-26 NOTE — Progress Notes (Signed)
Radiation Oncology         ([phone]) [phone] ________________________________  Name: [name] MRN: [id number]  Date: 06/29/2020  DOB: [birth year]  Follow-Up Visit Note  CC: Maurice Small, MD  Maurice Small, MD    ICD-10-CM   1. Endometrial cancer ([facility])  C54.1 HYDROmorphone (DILAUDID) injection 0.75 mg    ondansetron (ZOFRAN) tablet 4 mg      Diagnosis:  FIGO stage IB (cT1b, cN0, cM0) endometrioid endometrial adenocarcinoma, grade 1   Interval Since Last Radiation:  1 years , 3 weeks, 5 days  Intent: Curative  Radiation Treatment Dates: 05/06/2019 through 06/04/2019 Site Technique Total Dose (Gy) Dose per Fx (Gy) Completed Fx Beam Energies  Vagina: Pelvis HDR-brachy 30/30 6 5/5 Ir-192    Narrative:  The patient returns today for routine 6 month follow-up. Pertinent imaging since she was last seen includes an ultrasound of left axilla taken on 01/16/20. Results from imaging showed single borderline enlarged left axillary lymph node; noted to be likely reactive. Follow up ultrasound to the same lymph node taken on 03/02/20 revealed the left axillary lymph node to have improved, showing no evidence of malignancy.      The patient received genetic counseling. Results from genetic testing received on 03/09/20 were negative.                 On 04/10/20, the patient met with Dr. Berline Lopes for a routine surveillance visit. During the visit, it was noted that the patient had been experiencing significant stress at home due to her husband's recent hospitalization within that last month. She was additionally noted during the visit to be still using the medium sized vaginal dilator, although once a week now.  She continues to use her vaginal dilator approximately once per week.  She denies any vaginal bleeding with use.  She reports her husband has developed another stroke he has had some problems with his speech that is undergoing speech therapy and this issue is improving.  She denies any pelvic pain  vaginal bleeding or discharge.  She denies any hematuria or rectal bleeding.  She denies any abdominal bloating.  Allergies:  is allergic to molds & smuts and latex.  Meds: Current Outpatient Medications  Medication Sig Dispense Refill   alendronate (FOSAMAX) 70 MG tablet Take 70 mg by mouth once a week.     Ca Phosphate-Cholecalciferol (CALCIUM/VITAMIN D3 GUMMIES) 250-350 MG-UNIT CHEW Chew 3 tablets by mouth daily.     CANNABIDIOL PO Take by mouth at bedtime. CBD gymmies     cholecalciferol (VITAMIN D3) 25 MCG (1000 UNIT) tablet Take by mouth once.     Cyanocobalamin (VITAMIN B-12 CR) 1500 MCG TBCR Take 1,000 mcg by mouth daily.      loratadine (CLARITIN) 10 MG tablet Take 10 mg by mouth daily.     losartan-hydrochlorothiazide (HYZAAR) 100-25 MG per tablet Take 1 tablet by mouth daily.     montelukast (SINGULAIR) 10 MG tablet TAKE ONE TABLET AT BEDTIME. 30 tablet 1   Multiple Vitamin (MULTIVITAMIN WITH MINERALS) TABS tablet Take 1 tablet by mouth daily.     omeprazole (PRILOSEC) 20 MG capsule TAKE ONE CAPSULE BY MOUTH EVERY DAY 30 capsule 5   SYMBICORT 80-4.5 MCG/ACT inhaler Inhale 1 puff into the lungs in the morning and at bedtime. 10.2 g 4   albuterol (PROVENTIL) (2.5 MG/3ML) 0.083% nebulizer solution Take 3 mLs (2.5 mg total) by nebulization every 6 (six) hours as needed for wheezing or shortness of breath. (Patient not  taking: Reported on 06/29/2020) 75 vial 5   EPINEPHrine 0.3 mg/0.3 mL IJ SOAJ injection SMARTSIG:Injection IM As Directed (Patient not taking: No sig reported)     Current Facility-Administered Medications  Medication Dose Route Frequency Provider Last Rate Last Admin   ondansetron (ZOFRAN) tablet 4 mg  4 mg Oral Once Gery Pray, MD        Physical Findings: The patient is in no acute distress. Patient is alert and oriented.  height is _0  (1.575 m) and weight is 154 lb 12.8 oz (70.2 kg). Her temperature is 97.6 F (36.4 C). Her blood pressure is 143/78  (abnormal) and her pulse is 75. Her respiration is 18 and oxygen saturation is 97%. .  No significant changes. Lungs are clear to auscultation bilaterally. Heart has regular rate and rhythm. No palpable cervical, supraclavicular, or axillary adenopathy. Abdomen soft, non-tender, normal bowel sounds.  Patient is very intolerant of pelvic exams.  Prior to exam today she was given .75 mg of dilaudid IM and Zofran.  Patient proceeded to undergo pelvic exam.  There are no suspicious lesions noted along the external genitalia.  A speculum exam was performed.  There are no mucosal lesions noted in the vaginal vault..  Some radiation changes noted at the cuff.  On bimanual and rectovaginal examination there are no pelvic masses appreciated.  Vaginal cuff intact.   Lab Findings: Lab Results  Component Value Date   WBC 6.5 03/18/2019   HGB 14.7 03/18/2019   HCT 43.3 03/18/2019   MCV 96.0 03/18/2019   PLT 371 03/18/2019    Radiographic Findings: No results found.  Impression:  FIGO stage IB (cT1b, cN0, cM0) endometrioid endometrial adenocarcinoma, grade 1   No evidence of recurrence on clinical exam today.  Plan: Patient is now a year out from her treatment and will be transitioned to 68-monthinterval follow-up.  She will see Dr. TBerline Lopesin 6 months and then follow-up with radiation oncology in 1 year.   20 minutes of total time was spent for this patient encounter, including preparation, face-to-face counseling with the patient and coordination of care, physical exam, and documentation of the encounter. ____________________________________  JBlair Promise PhD, MD   This document serves as a record of services personally performed by JGery Pray MD. It was created on his behalf by ERoney Mans a trained medical scribe. The creation of this record is based on the scribe's personal observations and the provider's statements to them. This document has been checked and approved by the attending  provider.

## 2020-06-29 ENCOUNTER — Other Ambulatory Visit: Payer: Self-pay

## 2020-06-29 ENCOUNTER — Ambulatory Visit
Admission: RE | Admit: 2020-06-29 | Discharge: 2020-06-29 | Disposition: A | Discharge status: Home / Self Care | Payer: Medicare Other | Source: Ambulatory Visit | Attending: Radiation Oncology | Admitting: Radiation Oncology

## 2020-06-29 ENCOUNTER — Encounter: Payer: Self-pay | Admitting: Radiation Oncology

## 2020-06-29 DIAGNOSIS — Z923 Personal history of irradiation: Secondary | ICD-10-CM | POA: Insufficient documentation

## 2020-06-29 DIAGNOSIS — Z79899 Other long term (current) drug therapy: Secondary | ICD-10-CM | POA: Insufficient documentation

## 2020-06-29 DIAGNOSIS — C541 Malignant neoplasm of endometrium: Secondary | ICD-10-CM

## 2020-06-29 DIAGNOSIS — Z8542 Personal history of malignant neoplasm of other parts of uterus: Secondary | ICD-10-CM | POA: Diagnosis not present

## 2020-06-29 DIAGNOSIS — Z08 Encounter for follow-up examination after completed treatment for malignant neoplasm: Secondary | ICD-10-CM | POA: Diagnosis not present

## 2020-06-29 MED ORDER — ONDANSETRON HCL 4 MG PO TABS
4.0000 mg | ORAL_TABLET | Freq: Once | ORAL | Status: DC
Start: 2020-06-29 — End: 2020-06-30
  Filled 2020-06-29: qty 1

## 2020-06-29 MED ORDER — HYDROMORPHONE HCL 1 MG/ML IJ SOLN
0.7500 mg | Freq: Once | INTRAMUSCULAR | Status: AC
  Administered 2020-06-29: 0.75 mg via INTRAMUSCULAR
  Filled 2020-06-29: qty 1

## 2020-06-29 NOTE — Progress Notes (Signed)
[name] is here today for follow up post radiation to the pelvic.  They completed their radiation on: 06/04/19  Does the patient complain of any of the following:  Pain:Patient denies pain Abdominal bloating:  No Diarrhea/Constipation: no Nausea/Vomiting: no Vaginal Discharge: no Blood in Urine or Stool: no Urinary Issues (dysuria/incomplete emptying/ incontinence/ increased frequency/urgency): no Does patient report using vaginal dilator 2-3 times a week and/or sexually active 2-3 weeks: yes, patient reports using dilator once per week.  Post radiation skin changes: no   Additional comments if applicable:   Vitals:   06/29/20 1103  BP: (!) 143/78  Pulse: 75  Resp: 18  Temp: 97.6 F (36.4 C)  SpO2: 97%  Weight: 154 lb 12.8 oz (70.2 kg)  Height: 5\' 2"  (1.575 m)

## 2020-07-03 DIAGNOSIS — J301 Allergic rhinitis due to pollen: Secondary | ICD-10-CM | POA: Diagnosis not present

## 2020-07-03 DIAGNOSIS — J3089 Other allergic rhinitis: Secondary | ICD-10-CM | POA: Diagnosis not present

## 2020-07-10 DIAGNOSIS — J301 Allergic rhinitis due to pollen: Secondary | ICD-10-CM | POA: Diagnosis not present

## 2020-07-10 DIAGNOSIS — J3089 Other allergic rhinitis: Secondary | ICD-10-CM | POA: Diagnosis not present

## 2020-07-23 DIAGNOSIS — J3089 Other allergic rhinitis: Secondary | ICD-10-CM | POA: Diagnosis not present

## 2020-07-23 DIAGNOSIS — J301 Allergic rhinitis due to pollen: Secondary | ICD-10-CM | POA: Diagnosis not present

## 2020-07-31 DIAGNOSIS — J301 Allergic rhinitis due to pollen: Secondary | ICD-10-CM | POA: Diagnosis not present

## 2020-07-31 DIAGNOSIS — J3089 Other allergic rhinitis: Secondary | ICD-10-CM | POA: Diagnosis not present

## 2020-08-03 DIAGNOSIS — J3089 Other allergic rhinitis: Secondary | ICD-10-CM | POA: Diagnosis not present

## 2020-08-03 DIAGNOSIS — J301 Allergic rhinitis due to pollen: Secondary | ICD-10-CM | POA: Diagnosis not present

## 2020-08-07 DIAGNOSIS — J301 Allergic rhinitis due to pollen: Secondary | ICD-10-CM | POA: Diagnosis not present

## 2020-08-07 DIAGNOSIS — J3089 Other allergic rhinitis: Secondary | ICD-10-CM | POA: Diagnosis not present

## 2020-08-08 ENCOUNTER — Other Ambulatory Visit: Payer: Self-pay | Admitting: Pulmonary Disease

## 2020-08-10 ENCOUNTER — Telehealth: Payer: Self-pay | Admitting: *Deleted

## 2020-08-10 NOTE — Telephone Encounter (Signed)
CALLED PATIENT TO INFORM OF FU WITH DR. Berline Lopes ON 12-11-20- ARRIVAL TIME- 1 PM, LVM FOR A RETURN CALL

## 2020-08-12 DIAGNOSIS — J301 Allergic rhinitis due to pollen: Secondary | ICD-10-CM | POA: Diagnosis not present

## 2020-08-12 DIAGNOSIS — J3089 Other allergic rhinitis: Secondary | ICD-10-CM | POA: Diagnosis not present

## 2020-08-17 DIAGNOSIS — J3089 Other allergic rhinitis: Secondary | ICD-10-CM | POA: Diagnosis not present

## 2020-08-17 DIAGNOSIS — J301 Allergic rhinitis due to pollen: Secondary | ICD-10-CM | POA: Diagnosis not present

## 2020-08-19 DIAGNOSIS — J3089 Other allergic rhinitis: Secondary | ICD-10-CM | POA: Diagnosis not present

## 2020-08-24 DIAGNOSIS — J3089 Other allergic rhinitis: Secondary | ICD-10-CM | POA: Diagnosis not present

## 2020-08-24 DIAGNOSIS — J301 Allergic rhinitis due to pollen: Secondary | ICD-10-CM | POA: Diagnosis not present

## 2020-08-27 DIAGNOSIS — J3089 Other allergic rhinitis: Secondary | ICD-10-CM | POA: Diagnosis not present

## 2020-08-31 DIAGNOSIS — J301 Allergic rhinitis due to pollen: Secondary | ICD-10-CM | POA: Diagnosis not present

## 2020-08-31 DIAGNOSIS — J3089 Other allergic rhinitis: Secondary | ICD-10-CM | POA: Diagnosis not present

## 2020-09-09 ENCOUNTER — Other Ambulatory Visit: Payer: Self-pay | Admitting: Pulmonary Disease

## 2020-09-09 DIAGNOSIS — J3089 Other allergic rhinitis: Secondary | ICD-10-CM | POA: Diagnosis not present

## 2020-09-09 DIAGNOSIS — J301 Allergic rhinitis due to pollen: Secondary | ICD-10-CM | POA: Diagnosis not present

## 2020-09-16 DIAGNOSIS — J301 Allergic rhinitis due to pollen: Secondary | ICD-10-CM | POA: Diagnosis not present

## 2020-09-16 DIAGNOSIS — J3089 Other allergic rhinitis: Secondary | ICD-10-CM | POA: Diagnosis not present

## 2020-09-20 DIAGNOSIS — Z23 Encounter for immunization: Secondary | ICD-10-CM | POA: Diagnosis not present

## 2020-09-24 DIAGNOSIS — J3089 Other allergic rhinitis: Secondary | ICD-10-CM | POA: Diagnosis not present

## 2020-09-24 DIAGNOSIS — J301 Allergic rhinitis due to pollen: Secondary | ICD-10-CM | POA: Diagnosis not present

## 2020-10-01 DIAGNOSIS — J301 Allergic rhinitis due to pollen: Secondary | ICD-10-CM | POA: Diagnosis not present

## 2020-10-01 DIAGNOSIS — J3089 Other allergic rhinitis: Secondary | ICD-10-CM | POA: Diagnosis not present

## 2020-10-08 DIAGNOSIS — J301 Allergic rhinitis due to pollen: Secondary | ICD-10-CM | POA: Diagnosis not present

## 2020-10-08 DIAGNOSIS — J3089 Other allergic rhinitis: Secondary | ICD-10-CM | POA: Diagnosis not present

## 2020-10-16 DIAGNOSIS — J3089 Other allergic rhinitis: Secondary | ICD-10-CM | POA: Diagnosis not present

## 2020-10-16 DIAGNOSIS — J301 Allergic rhinitis due to pollen: Secondary | ICD-10-CM | POA: Diagnosis not present

## 2020-10-19 DIAGNOSIS — J3089 Other allergic rhinitis: Secondary | ICD-10-CM | POA: Diagnosis not present

## 2020-10-19 DIAGNOSIS — J301 Allergic rhinitis due to pollen: Secondary | ICD-10-CM | POA: Diagnosis not present

## 2020-10-20 ENCOUNTER — Other Ambulatory Visit: Payer: Self-pay | Admitting: Family Medicine

## 2020-10-20 DIAGNOSIS — E2839 Other primary ovarian failure: Secondary | ICD-10-CM

## 2020-10-23 ENCOUNTER — Other Ambulatory Visit: Payer: Self-pay | Admitting: Pulmonary Disease

## 2020-10-26 DIAGNOSIS — J3089 Other allergic rhinitis: Secondary | ICD-10-CM | POA: Diagnosis not present

## 2020-10-26 DIAGNOSIS — J301 Allergic rhinitis due to pollen: Secondary | ICD-10-CM | POA: Diagnosis not present

## 2020-10-29 DIAGNOSIS — J3089 Other allergic rhinitis: Secondary | ICD-10-CM | POA: Diagnosis not present

## 2020-10-29 DIAGNOSIS — J301 Allergic rhinitis due to pollen: Secondary | ICD-10-CM | POA: Diagnosis not present

## 2020-11-05 ENCOUNTER — Other Ambulatory Visit: Payer: Self-pay | Admitting: Internal Medicine

## 2020-11-05 DIAGNOSIS — J3089 Other allergic rhinitis: Secondary | ICD-10-CM | POA: Diagnosis not present

## 2020-11-05 DIAGNOSIS — J301 Allergic rhinitis due to pollen: Secondary | ICD-10-CM | POA: Diagnosis not present

## 2020-11-05 DIAGNOSIS — Z1231 Encounter for screening mammogram for malignant neoplasm of breast: Secondary | ICD-10-CM

## 2020-11-09 DIAGNOSIS — J3089 Other allergic rhinitis: Secondary | ICD-10-CM | POA: Diagnosis not present

## 2020-11-09 DIAGNOSIS — J301 Allergic rhinitis due to pollen: Secondary | ICD-10-CM | POA: Diagnosis not present

## 2020-11-12 DIAGNOSIS — Z20822 Contact with and (suspected) exposure to covid-19: Secondary | ICD-10-CM | POA: Diagnosis not present

## 2020-11-16 DIAGNOSIS — J3089 Other allergic rhinitis: Secondary | ICD-10-CM | POA: Diagnosis not present

## 2020-11-16 DIAGNOSIS — J301 Allergic rhinitis due to pollen: Secondary | ICD-10-CM | POA: Diagnosis not present

## 2020-11-25 DIAGNOSIS — J301 Allergic rhinitis due to pollen: Secondary | ICD-10-CM | POA: Diagnosis not present

## 2020-11-25 DIAGNOSIS — J3089 Other allergic rhinitis: Secondary | ICD-10-CM | POA: Diagnosis not present

## 2020-12-10 ENCOUNTER — Encounter: Payer: Self-pay | Admitting: Gynecologic Oncology

## 2020-12-10 NOTE — Progress Notes (Signed)
Gynecologic Oncology Return Clinic Visit  12/10/20  Reason for Visit: Surveillance visit in the setting of high intermediate risk uterine cancer  Treatment History: Oncology History Overview Note  MSI-stable   Endometrial cancer ([place])  02/27/2019 Initial Biopsy   Gr1 EMC on EMB   02/27/2019 Initial Diagnosis   Endometrial cancer ([place])   03/05/2019 Imaging   Pelvic ultrasound: Uterus measures 7.9 x 5.7 x 3.7 cm with an endometrial lining of 2.9 cm.  Bilateral ovaries normal in size and shape.   03/20/2019 Surgery   Lsc LOA, TRH/BSO, SLN on R, left pelvic LND   03/20/2019 Cancer Staging   Staging form: Corpus Uteri - Carcinoma and Carcinosarcoma, AJCC 8th Edition - Clinical stage from 03/20/2019: FIGO Stage IB (cT1b, cN0, cM0) - Signed by Lafonda Mosses, MD on 03/27/2019    05/06/2019 - 06/04/2019 Radiation Therapy   Site Technique Total Dose (Gy) Dose per Fx (Gy) Completed Fx Beam Energies  Vagina: Pelvis HDR-brachy 30/30 6 5/5 Ir-192     03/09/2020 Genetic Testing   Negative genetic testing on the CancerNext-Expanded+RNAinsight panel.  The CancerNext-Expanded gene panel offered by [facility] and includes sequencing and rearrangement analysis for the following 77 genes: AIP, ALK, APC*, ATM*, AXIN2, BAP1, BARD1, BLM, BMPR1A, BRCA1*, BRCA2*, BRIP1*, CDC73, CDH1*, CDK4, CDKN1B, CDKN2A, CHEK2*, CTNNA1, DICER1, FANCC, FH, FLCN, GALNT12, KIF1B, LZTR1, MAX, MEN1, MET, MLH1*, MSH2*, MSH3, MSH6*, MUTYH*, NBN, NF1*, NF2, NTHL1, PALB2*, PHOX2B, PMS2*, POT1, PRKAR1A, PTCH1, PTEN*, RAD51C*, RAD51D*, RB1, RECQL, RET, SDHA, SDHAF2, SDHB, SDHC, SDHD, SMAD4, SMARCA4, SMARCB1, SMARCE1, STK11, SUFU, TMEM127, TP53*, TSC1, TSC2, VHL and XRCC2 (sequencing and deletion/duplication); EGFR, EGLN1, HOXB13, KIT, MITF, PDGFRA, POLD1, and POLE (sequencing only); EPCAM and GREM1 (deletion/duplication only). DNA and RNA analyses performed for * genes. The report date was March 09, 2020.     Interval  History: Presents today for surveillance visit.  Denies any new symptoms since her last visit including bleeding, vaginal discharge, pelvic pain, or bladder symptoms.  Continues to have some baseline constipation, unchanged.  Dealing with an asthma exacerbation that is finally better the last 2 days.  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  Since her last visit with me, she and her husband traveled to [place] to see her son.  On the drive there, her husband started having strokelike symptoms.  He ultimately was diagnosed with a stroke.  He has recovered significantly since in terms of speech, still has some physical signs including drop foot.  Past Medical/Surgical History: Past Medical History:  Diagnosis Date   Allergic rhinitis    Arthritis    Asthma    Carpal tunnel syndrome on right    Endometrial cancer ([place])    Family history of breast cancer    Family history of colon cancer    Family history of kidney cancer    Family history of stomach cancer    GERD (gastroesophageal reflux disease)    History of radiation therapy 06/04/2019   vaginal brachytherapy 05/06/2019-06/04/2019   Dr Gery Pray   Hyperlipemia    PMB (postmenopausal bleeding)    Polio    Left leg   Unspecified essential hypertension    clearance Dr Schuyler Amor with note on chart    Past Surgical History:  Procedure Laterality Date   BREAST BIOPSY Bilateral    benign   BREAST SURGERY     bilateral fibroid tumors removed   CARPAL TUNNEL RELEASE     right   CESAREAN  SECTION     x 2   COLON SURGERY     scar tissue removed   COLONOSCOPY     leg surgery     bilateral, numerous   LYMPH NODE DISSECTION N/A 03/20/2019   Procedure: LYMPH NODE DISSECTION;  Surgeon: Lafonda Mosses, MD;  Location: [facility];  Service: Gynecology;  Laterality: N/A;   ROBOTIC ASSISTED TOTAL HYSTERECTOMY WITH BILATERAL SALPINGO OOPHERECTOMY N/A 03/20/2019   Procedure: XI  ROBOTIC ASSISTED TOTAL HYSTERECTOMY WITH BILATERAL SALPINGO OOPHORECTOMY;  Surgeon: Lafonda Mosses, MD;  Location: [facility];  Service: Gynecology;  Laterality: N/A;   SENTINEL NODE BIOPSY  03/20/2019   Procedure: SENTINEL NODE BIOPSY;  Surgeon: Lafonda Mosses, MD;  Location: [facility];  Service: Gynecology;;   tooth implant     titanium/ front upper right   TOTAL HIP ARTHROPLASTY     right   TOTAL HIP REVISION  08/15/2011   Procedure: TOTAL HIP REVISION;  Surgeon: Mauri Pole, MD;  Location: [facility];  Service: Orthopedics;  Laterality: Right;   vocal surgery     cyst removed   WRIST SURGERY  2009   left    Family History  Problem Relation Age of Onset   Kidney cancer Father 52   CAD Father    Bladder Cancer Father 47       ureter   Melanoma Mother 54       STAGE 3   Breast cancer Mother 51   Heart attack Mother    Colon cancer Maternal Grandfather    Breast cancer Maternal Grandmother        meta to stomach   Breast cancer Maternal Aunt 78        bilateral   Stomach cancer Paternal Grandmother 68   Breast cancer Maternal Aunt 63   Non-Hodgkin's lymphoma Cousin    Hodgkin's lymphoma Son    Rectal cancer Neg Hx     Social History   Socioeconomic History   Marital status: Married    Spouse name: Not on file   Number of children: 2   Years of education: Not on file   Highest education level: Not on file  Occupational History   Occupation: Music Minister    Comment: Teacher, early years/pre: [employer]  Tobacco Use   Smoking status: Never   Smokeless tobacco: Never  Vaping Use   Vaping Use: Never used  Substance and Sexual Activity   Alcohol use: Yes    Alcohol/week: 2.0 standard drinks    Types: 1 Glasses of wine, 1 Cans of beer per week    Comment: beer nightly   Drug use: No   Sexual activity: Yes    Birth control/protection: Post-menopausal  Other Topics Concern   Not on file  Social History  Narrative   Not on file   Social Determinants of Health   Financial Resource Strain: Not on file  Food Insecurity: Not on file  Transportation Needs: Not on file  Physical Activity: Not on file  Stress: Not on file  Social Connections: Not on file    Current Medications:  Current Outpatient Medications:    Ca Phosphate-Cholecalciferol (CALCIUM/VITAMIN D3 GUMMIES) 250-350 MG-UNIT CHEW, Chew 3 tablets by mouth daily., Disp: , Rfl:    cholecalciferol (VITAMIN D3) 25 MCG (1000 UNIT) tablet, Take by mouth once., Disp: , Rfl:    Cyanocobalamin (VITAMIN B-12 CR) 1500 MCG TBCR, Take 1,000 mcg by mouth  daily. , Disp: , Rfl:    loratadine (CLARITIN) 10 MG tablet, Take 10 mg by mouth daily., Disp: , Rfl:    losartan-hydrochlorothiazide (HYZAAR) 100-25 MG per tablet, Take 1 tablet by mouth daily., Disp: , Rfl:    montelukast (SINGULAIR) 10 MG tablet, TAKE ONE TABLET BY MOUTH AT BEDTIME, Disp: 30 tablet, Rfl: 3   Multiple Vitamin (MULTIVITAMIN WITH MINERALS) TABS tablet, Take 1 tablet by mouth daily., Disp: , Rfl:    omeprazole (PRILOSEC) 20 MG capsule, TAKE ONE CAPSULE BY MOUTH EVERY DAY, Disp: 30 capsule, Rfl: 6   OVER THE COUNTER MEDICATION, Take 0.5 drops by mouth at bedtime. CBD Oil, 1/2 drop under the tongue nightly, Disp: , Rfl:    SYMBICORT 80-4.5 MCG/ACT inhaler, Inhale 1 puff into the lungs in the morning and at bedtime., Disp: 10.2 g, Rfl: 4   albuterol (PROVENTIL) (2.5 MG/3ML) 0.083% nebulizer solution, Take 3 mLs (2.5 mg total) by nebulization every 6 (six) hours as needed for wheezing or shortness of breath. (Patient not taking: Reported on 06/29/2020), Disp: 75 vial, Rfl: 5   alendronate (FOSAMAX) 70 MG tablet, Take 70 mg by mouth once a week. (Patient not taking: Reported on 12/10/2020), Disp: , Rfl:    EPINEPHrine 0.3 mg/0.3 mL IJ SOAJ injection, SMARTSIG:Injection IM As Directed (Patient not taking: Reported on 01/23/2020), Disp: , Rfl:   Review of Systems: + Cough (almost gone),  wheezing, weak. Denies appetite changes, fevers, chills, fatigue, unexplained weight changes. Denies hearing loss, neck lumps or masses, mouth sores, ringing in ears or voice changes. Denies shortness of breath. Denies chest pain or palpitations. Denies leg swelling. Denies abdominal distention, pain, blood in stools, constipation, diarrhea, nausea, vomiting, or early satiety. Denies pain with intercourse, dysuria, frequency, hematuria or incontinence. Denies hot flashes, pelvic pain, vaginal bleeding or vaginal discharge.   Denies joint pain, back pain or muscle pain/cramps. Denies itching, rash, or wounds. Denies dizziness, headaches, numbness or seizures. Denies swollen lymph nodes or glands, denies easy bruising or bleeding. Denies anxiety, depression, confusion, or decreased concentration.  Physical Exam: BP 135/80 (BP Location: Left Arm, Patient Position: Sitting)   Pulse 90   Temp 97.6 F (36.4 C) (Tympanic)   Resp 16   Ht 5' 2" (1.575 m)   SpO2 96%   BMI 28.31 kg/m  General: Alert, oriented, no acute distress. HEENT: Normocephalic, atraumatic, sclera anicteric. Chest: Clear to auscultation bilaterally.  No inspiratory or expiratory wheezing appreciated. Cardiovascular: Regular rate and rhythm, no murmurs. Abdomen: soft, nontender.  Normoactive bowel sounds.  No masses or hepatosplenomegaly appreciated.  Well-healed incisions. Extremities: Grossly normal range of motion although somewhat decreased mobility of her left lower extremity, at baseline, brace in place.  Warm, well perfused.  No edema bilaterally. Skin: No rashes or lesions noted. Lymphatics: No cervical, supraclavicular, or inguinal adenopathy. GU: Normal appearing external genitalia without erythema, excoriation, or lesions.  Speculum exam reveals moderately atrophic vaginal mucosa consistent with radiation changes, no lesions or masses.  Small speculum used.  Bimanual exam reveals no nodularity.  Rectovaginal exam  confirms his exam findings  Laboratory & Radiologic Studies: None new  Assessment & Plan: [name] is a 70 y.o. 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 Finished adjuvant RT in 06/2019.   Patient is doing well and is NED on exam today.  Encouraged to use her dilator.  There is no evidence of agglutination on exam.  She continues to need intramuscular  Dilaudid for her exam, although she tolerated it very well without any difficulty today with a decreased dose today.  For her next visit, we will plan either for oral Dilaudid to be taken before she gets here or vaginal Valium.   She is now approximately 1.5 years out from completing adjuvant treatment.  We have transition to visits every 6 months.  I will see her back over the summer.   We discussed signs and symptoms that would be concerning for disease recurrence, and the patient knows to call if she develops any of these sooner than her next scheduled visit.    38 minutes of total time was spent for this patient encounter, including preparation, face-to-face counseling with the patient and coordination of care, and documentation of the encounter.  Jeral Pinch, MD  Division of Gynecologic Oncology  Department of Obstetrics and Gynecology  [facility]

## 2020-12-11 ENCOUNTER — Inpatient Hospital Stay: Payer: Medicare Other | Attending: Gynecologic Oncology | Admitting: Gynecologic Oncology

## 2020-12-11 ENCOUNTER — Other Ambulatory Visit: Payer: Self-pay | Admitting: Gynecologic Oncology

## 2020-12-11 ENCOUNTER — Encounter: Payer: Self-pay | Admitting: Gynecologic Oncology

## 2020-12-11 ENCOUNTER — Other Ambulatory Visit: Payer: Self-pay

## 2020-12-11 ENCOUNTER — Inpatient Hospital Stay: Payer: Medicare Other

## 2020-12-11 ENCOUNTER — Telehealth: Payer: Self-pay | Admitting: *Deleted

## 2020-12-11 VITALS — BP 135/80 | HR 90 | Temp 97.6°F | Resp 16 | Ht 62.0 in

## 2020-12-11 DIAGNOSIS — K219 Gastro-esophageal reflux disease without esophagitis: Secondary | ICD-10-CM | POA: Insufficient documentation

## 2020-12-11 DIAGNOSIS — Z8542 Personal history of malignant neoplasm of other parts of uterus: Secondary | ICD-10-CM | POA: Diagnosis not present

## 2020-12-11 DIAGNOSIS — M25559 Pain in unspecified hip: Secondary | ICD-10-CM

## 2020-12-11 DIAGNOSIS — E785 Hyperlipidemia, unspecified: Secondary | ICD-10-CM | POA: Diagnosis not present

## 2020-12-11 DIAGNOSIS — Z90710 Acquired absence of both cervix and uterus: Secondary | ICD-10-CM | POA: Diagnosis not present

## 2020-12-11 DIAGNOSIS — Z90722 Acquired absence of ovaries, bilateral: Secondary | ICD-10-CM | POA: Insufficient documentation

## 2020-12-11 DIAGNOSIS — C541 Malignant neoplasm of endometrium: Secondary | ICD-10-CM

## 2020-12-11 DIAGNOSIS — G8929 Other chronic pain: Secondary | ICD-10-CM | POA: Diagnosis not present

## 2020-12-11 DIAGNOSIS — Z923 Personal history of irradiation: Secondary | ICD-10-CM | POA: Insufficient documentation

## 2020-12-11 DIAGNOSIS — Z79899 Other long term (current) drug therapy: Secondary | ICD-10-CM | POA: Diagnosis not present

## 2020-12-11 DIAGNOSIS — R102 Pelvic and perineal pain: Secondary | ICD-10-CM | POA: Diagnosis not present

## 2020-12-11 DIAGNOSIS — J45909 Unspecified asthma, uncomplicated: Secondary | ICD-10-CM | POA: Diagnosis not present

## 2020-12-11 DIAGNOSIS — Z7951 Long term (current) use of inhaled steroids: Secondary | ICD-10-CM | POA: Diagnosis not present

## 2020-12-11 DIAGNOSIS — I10 Essential (primary) hypertension: Secondary | ICD-10-CM | POA: Diagnosis not present

## 2020-12-11 MED ORDER — HYDROMORPHONE HCL 1 MG/ML IJ SOLN
0.5000 mg | Freq: Once | INTRAMUSCULAR | Status: AC
  Administered 2020-12-11: 0.5 mg via INTRAMUSCULAR

## 2020-12-11 MED ORDER — ONDANSETRON HCL 4 MG PO TABS
4.0000 mg | ORAL_TABLET | Freq: Once | ORAL | Status: DC
Start: 2020-12-11 — End: 2020-12-11

## 2020-12-11 MED ORDER — HYDROMORPHONE HCL 1 MG/ML IJ SOLN: 0.5000 mg | Freq: Once | INTRAMUSCULAR | Status: DC

## 2020-12-11 MED ORDER — ONDANSETRON HCL 8 MG PO TABS
4.0000 mg | ORAL_TABLET | Freq: Once | ORAL | Status: AC
  Administered 2020-12-11: 4 mg via ORAL
  Filled 2020-12-11: qty 1

## 2020-12-11 NOTE — Progress Notes (Signed)
0.5 mg dilaudid wasted. Witnessed by Kennon Holter, RN.

## 2020-12-11 NOTE — Progress Notes (Signed)
IM dilaudid injection along with zofran PO tablet ordered to be administered 30 minutes prior to having to have a pelvic exam for endometrial cancer surveillance.

## 2020-12-11 NOTE — Telephone Encounter (Signed)
Per patient request canceled Dr Clabe Seal appt, patient stated "I am done with female physicians, I would like to stay with Dr Berline Lopes."

## 2020-12-11 NOTE — Patient Instructions (Signed)
Was good to see you!  I do not see or feel any evidence of cancer on your exam today.  We will plan on continuing visits every 6 months.  If anything changes before your next visit, like vaginal bleeding, pelvic pain, unintentional weight loss, or change to your bowel function, please call to see me sooner.  Give my best to your husband.

## 2020-12-14 ENCOUNTER — Ambulatory Visit
Admission: RE | Admit: 2020-12-14 | Discharge: 2020-12-14 | Disposition: A | Discharge status: Home / Self Care | Payer: Medicare Other | Source: Ambulatory Visit | Attending: Internal Medicine | Admitting: Internal Medicine

## 2020-12-14 DIAGNOSIS — Z1231 Encounter for screening mammogram for malignant neoplasm of breast: Secondary | ICD-10-CM

## 2020-12-23 DIAGNOSIS — J3089 Other allergic rhinitis: Secondary | ICD-10-CM | POA: Diagnosis not present

## 2020-12-23 DIAGNOSIS — J301 Allergic rhinitis due to pollen: Secondary | ICD-10-CM | POA: Diagnosis not present

## 2021-01-11 DIAGNOSIS — J3089 Other allergic rhinitis: Secondary | ICD-10-CM | POA: Diagnosis not present

## 2021-01-11 DIAGNOSIS — J301 Allergic rhinitis due to pollen: Secondary | ICD-10-CM | POA: Diagnosis not present

## 2021-01-22 DIAGNOSIS — J3089 Other allergic rhinitis: Secondary | ICD-10-CM | POA: Diagnosis not present

## 2021-01-22 DIAGNOSIS — J301 Allergic rhinitis due to pollen: Secondary | ICD-10-CM | POA: Diagnosis not present

## 2021-01-27 DIAGNOSIS — E538 Deficiency of other specified B group vitamins: Secondary | ICD-10-CM | POA: Diagnosis not present

## 2021-01-27 DIAGNOSIS — I10 Essential (primary) hypertension: Secondary | ICD-10-CM | POA: Diagnosis not present

## 2021-01-27 DIAGNOSIS — M810 Age-related osteoporosis without current pathological fracture: Secondary | ICD-10-CM | POA: Diagnosis not present

## 2021-01-27 DIAGNOSIS — Z77018 Contact with and (suspected) exposure to other hazardous metals: Secondary | ICD-10-CM | POA: Diagnosis not present

## 2021-01-27 DIAGNOSIS — T56891A Toxic effect of other metals, accidental (unintentional), initial encounter: Secondary | ICD-10-CM | POA: Diagnosis not present

## 2021-01-28 DIAGNOSIS — J3089 Other allergic rhinitis: Secondary | ICD-10-CM | POA: Diagnosis not present

## 2021-01-28 DIAGNOSIS — J301 Allergic rhinitis due to pollen: Secondary | ICD-10-CM | POA: Diagnosis not present

## 2021-02-05 DIAGNOSIS — J3089 Other allergic rhinitis: Secondary | ICD-10-CM | POA: Diagnosis not present

## 2021-02-05 DIAGNOSIS — J301 Allergic rhinitis due to pollen: Secondary | ICD-10-CM | POA: Diagnosis not present

## 2021-02-11 DIAGNOSIS — J3089 Other allergic rhinitis: Secondary | ICD-10-CM | POA: Diagnosis not present

## 2021-02-11 DIAGNOSIS — J301 Allergic rhinitis due to pollen: Secondary | ICD-10-CM | POA: Diagnosis not present

## 2021-02-15 DIAGNOSIS — J301 Allergic rhinitis due to pollen: Secondary | ICD-10-CM | POA: Diagnosis not present

## 2021-02-15 DIAGNOSIS — J3089 Other allergic rhinitis: Secondary | ICD-10-CM | POA: Diagnosis not present

## 2021-02-16 ENCOUNTER — Other Ambulatory Visit: Payer: Self-pay | Admitting: Pulmonary Disease

## 2021-02-26 DIAGNOSIS — J301 Allergic rhinitis due to pollen: Secondary | ICD-10-CM | POA: Diagnosis not present

## 2021-02-26 DIAGNOSIS — J3089 Other allergic rhinitis: Secondary | ICD-10-CM | POA: Diagnosis not present

## 2021-03-03 DIAGNOSIS — C55 Malignant neoplasm of uterus, part unspecified: Secondary | ICD-10-CM

## 2021-03-03 DIAGNOSIS — J301 Allergic rhinitis due to pollen: Secondary | ICD-10-CM | POA: Diagnosis not present

## 2021-03-03 DIAGNOSIS — J3089 Other allergic rhinitis: Secondary | ICD-10-CM | POA: Diagnosis not present

## 2021-03-03 HISTORY — DX: Malignant neoplasm of uterus, part unspecified: C55

## 2021-03-03 HISTORY — PX: VAGINAL HYSTERECTOMY: SUR661

## 2021-03-04 DIAGNOSIS — H04123 Dry eye syndrome of bilateral lacrimal glands: Secondary | ICD-10-CM | POA: Diagnosis not present

## 2021-03-04 DIAGNOSIS — H40033 Anatomical narrow angle, bilateral: Secondary | ICD-10-CM | POA: Diagnosis not present

## 2021-03-04 DIAGNOSIS — H2513 Age-related nuclear cataract, bilateral: Secondary | ICD-10-CM | POA: Diagnosis not present

## 2021-03-04 DIAGNOSIS — H40013 Open angle with borderline findings, low risk, bilateral: Secondary | ICD-10-CM | POA: Diagnosis not present

## 2021-03-05 DIAGNOSIS — K219 Gastro-esophageal reflux disease without esophagitis: Secondary | ICD-10-CM | POA: Diagnosis not present

## 2021-03-05 DIAGNOSIS — J4530 Mild persistent asthma, uncomplicated: Secondary | ICD-10-CM | POA: Diagnosis not present

## 2021-03-05 DIAGNOSIS — J301 Allergic rhinitis due to pollen: Secondary | ICD-10-CM | POA: Diagnosis not present

## 2021-03-05 DIAGNOSIS — J3089 Other allergic rhinitis: Secondary | ICD-10-CM | POA: Diagnosis not present

## 2021-03-15 ENCOUNTER — Telehealth: Payer: Self-pay | Admitting: Pulmonary Disease

## 2021-03-15 MED ORDER — PREDNISONE 10 MG PO TABS: ORAL_TABLET | ORAL | 0 refills | Status: DC

## 2021-03-15 NOTE — Telephone Encounter (Signed)
Called and spoke with patient who states that she had an asthma attack last Monday and has not felt good all week, she has a lingering cough. The medicines she's taking does not seem to be working. Patient states that she has productive cough with green sputum but is now clear. States that she has a lot of drainage. Wants to know if Dr. Elsworth Soho could prescribe her something. Albuterol 2 puffs every 4 hours, mucinex, Symbicort and Delsym cough syrup ?Pharmacy: [facility]  ? ?Dr. Elsworth Soho please advise ?

## 2021-03-15 NOTE — Telephone Encounter (Signed)
Rigoberto Noel, MD ?to Colon Branch, CMA  [facility] Triage Pool   ?   4:30 PM ? She should get back on Symbicort -2 puffs twice daily.  ?Prednisone 10 mg tabs  Take 2 tabs daily with food x 5ds, then 1 tab daily with food x 5ds then STOP  ?Last office visit was in February 2022  ?Please arrange for office visit with APP  ? ? ?I spoke with the pt and notified of response per Dr Elsworth Soho. She verbalized understanding. She states she has to call back to make the appt bc she did not have her calendar. Rx for pred was sent. She states has symbicort already.  ? ?

## 2021-03-16 DIAGNOSIS — J209 Acute bronchitis, unspecified: Secondary | ICD-10-CM | POA: Diagnosis not present

## 2021-03-16 DIAGNOSIS — K219 Gastro-esophageal reflux disease without esophagitis: Secondary | ICD-10-CM | POA: Diagnosis not present

## 2021-03-16 DIAGNOSIS — J3089 Other allergic rhinitis: Secondary | ICD-10-CM | POA: Diagnosis not present

## 2021-03-16 DIAGNOSIS — J301 Allergic rhinitis due to pollen: Secondary | ICD-10-CM | POA: Diagnosis not present

## 2021-03-16 DIAGNOSIS — J4530 Mild persistent asthma, uncomplicated: Secondary | ICD-10-CM | POA: Diagnosis not present

## 2021-03-19 DIAGNOSIS — M8580 Other specified disorders of bone density and structure, unspecified site: Secondary | ICD-10-CM | POA: Diagnosis not present

## 2021-03-19 DIAGNOSIS — G14 Postpolio syndrome: Secondary | ICD-10-CM | POA: Diagnosis not present

## 2021-03-19 DIAGNOSIS — E538 Deficiency of other specified B group vitamins: Secondary | ICD-10-CM | POA: Diagnosis not present

## 2021-03-19 DIAGNOSIS — R1013 Epigastric pain: Secondary | ICD-10-CM | POA: Diagnosis not present

## 2021-03-19 DIAGNOSIS — M1990 Unspecified osteoarthritis, unspecified site: Secondary | ICD-10-CM | POA: Diagnosis not present

## 2021-03-19 DIAGNOSIS — Z1339 Encounter for screening examination for other mental health and behavioral disorders: Secondary | ICD-10-CM | POA: Diagnosis not present

## 2021-03-19 DIAGNOSIS — Z1331 Encounter for screening for depression: Secondary | ICD-10-CM | POA: Diagnosis not present

## 2021-03-19 DIAGNOSIS — I10 Essential (primary) hypertension: Secondary | ICD-10-CM | POA: Diagnosis not present

## 2021-03-19 DIAGNOSIS — R269 Unspecified abnormalities of gait and mobility: Secondary | ICD-10-CM | POA: Diagnosis not present

## 2021-03-19 DIAGNOSIS — J45909 Unspecified asthma, uncomplicated: Secondary | ICD-10-CM | POA: Diagnosis not present

## 2021-03-19 DIAGNOSIS — E785 Hyperlipidemia, unspecified: Secondary | ICD-10-CM | POA: Diagnosis not present

## 2021-03-19 DIAGNOSIS — C541 Malignant neoplasm of endometrium: Secondary | ICD-10-CM | POA: Diagnosis not present

## 2021-03-29 DIAGNOSIS — J301 Allergic rhinitis due to pollen: Secondary | ICD-10-CM | POA: Diagnosis not present

## 2021-03-29 DIAGNOSIS — J3089 Other allergic rhinitis: Secondary | ICD-10-CM | POA: Diagnosis not present

## 2021-03-30 DIAGNOSIS — Z20822 Contact with and (suspected) exposure to covid-19: Secondary | ICD-10-CM | POA: Diagnosis not present

## 2021-04-07 ENCOUNTER — Other Ambulatory Visit: Payer: Self-pay | Admitting: Internal Medicine

## 2021-04-08 ENCOUNTER — Ambulatory Visit: Payer: Medicare Other | Admitting: Pulmonary Disease

## 2021-04-08 ENCOUNTER — Other Ambulatory Visit: Payer: Self-pay | Admitting: Internal Medicine

## 2021-04-08 DIAGNOSIS — J301 Allergic rhinitis due to pollen: Secondary | ICD-10-CM | POA: Diagnosis not present

## 2021-04-08 DIAGNOSIS — J3089 Other allergic rhinitis: Secondary | ICD-10-CM | POA: Diagnosis not present

## 2021-04-12 DIAGNOSIS — J301 Allergic rhinitis due to pollen: Secondary | ICD-10-CM | POA: Diagnosis not present

## 2021-04-12 DIAGNOSIS — J3089 Other allergic rhinitis: Secondary | ICD-10-CM | POA: Diagnosis not present

## 2021-04-19 ENCOUNTER — Ambulatory Visit
Admission: RE | Admit: 2021-04-19 | Discharge: 2021-04-19 | Disposition: A | Discharge status: Home / Self Care | Payer: Medicare Other | Source: Ambulatory Visit | Attending: Family Medicine | Admitting: Family Medicine

## 2021-04-19 DIAGNOSIS — M810 Age-related osteoporosis without current pathological fracture: Secondary | ICD-10-CM | POA: Diagnosis not present

## 2021-04-19 DIAGNOSIS — Z780 Asymptomatic menopausal state: Secondary | ICD-10-CM | POA: Diagnosis not present

## 2021-04-19 DIAGNOSIS — E2839 Other primary ovarian failure: Secondary | ICD-10-CM

## 2021-04-26 DIAGNOSIS — J3089 Other allergic rhinitis: Secondary | ICD-10-CM | POA: Diagnosis not present

## 2021-04-26 DIAGNOSIS — J301 Allergic rhinitis due to pollen: Secondary | ICD-10-CM | POA: Diagnosis not present

## 2021-05-03 DIAGNOSIS — Z20822 Contact with and (suspected) exposure to covid-19: Secondary | ICD-10-CM | POA: Diagnosis not present

## 2021-05-05 ENCOUNTER — Other Ambulatory Visit: Payer: Self-pay | Admitting: Pulmonary Disease

## 2021-05-07 ENCOUNTER — Other Ambulatory Visit: Payer: Self-pay | Admitting: Pulmonary Disease

## 2021-05-07 DIAGNOSIS — J301 Allergic rhinitis due to pollen: Secondary | ICD-10-CM | POA: Diagnosis not present

## 2021-05-07 DIAGNOSIS — J3089 Other allergic rhinitis: Secondary | ICD-10-CM | POA: Diagnosis not present

## 2021-05-11 ENCOUNTER — Other Ambulatory Visit: Payer: Self-pay | Admitting: Pulmonary Disease

## 2021-05-12 ENCOUNTER — Ambulatory Visit: Payer: Medicare Other | Admitting: Pulmonary Disease

## 2021-05-12 DIAGNOSIS — J3089 Other allergic rhinitis: Secondary | ICD-10-CM | POA: Diagnosis not present

## 2021-05-12 DIAGNOSIS — J301 Allergic rhinitis due to pollen: Secondary | ICD-10-CM | POA: Diagnosis not present

## 2021-05-14 ENCOUNTER — Other Ambulatory Visit: Payer: Self-pay | Admitting: Pulmonary Disease

## 2021-05-14 ENCOUNTER — Telehealth: Payer: Self-pay | Admitting: Pulmonary Disease

## 2021-05-14 NOTE — Telephone Encounter (Signed)
ATC patient. LVMTCB. 

## 2021-05-17 DIAGNOSIS — J3089 Other allergic rhinitis: Secondary | ICD-10-CM | POA: Diagnosis not present

## 2021-05-17 DIAGNOSIS — J301 Allergic rhinitis due to pollen: Secondary | ICD-10-CM | POA: Diagnosis not present

## 2021-05-24 ENCOUNTER — Telehealth: Payer: Self-pay | Admitting: *Deleted

## 2021-05-24 NOTE — Telephone Encounter (Signed)
CALLED PATIENT TO ASK ABOUT RESCHEDULING FU FOR 07-01-21 DUE TO DR. KINARD BEING OFF, RESCHEDULED FOR 07-08-21 @ 11 AM, LVM FOR A RETURN CALL

## 2021-05-27 DIAGNOSIS — J3089 Other allergic rhinitis: Secondary | ICD-10-CM | POA: Diagnosis not present

## 2021-05-27 DIAGNOSIS — J3081 Allergic rhinitis due to animal (cat) (dog) hair and dander: Secondary | ICD-10-CM | POA: Diagnosis not present

## 2021-05-27 DIAGNOSIS — J301 Allergic rhinitis due to pollen: Secondary | ICD-10-CM | POA: Diagnosis not present

## 2021-06-03 DIAGNOSIS — J301 Allergic rhinitis due to pollen: Secondary | ICD-10-CM | POA: Diagnosis not present

## 2021-06-03 DIAGNOSIS — J3089 Other allergic rhinitis: Secondary | ICD-10-CM | POA: Diagnosis not present

## 2021-06-07 ENCOUNTER — Encounter: Payer: Self-pay | Admitting: Gynecologic Oncology

## 2021-06-08 ENCOUNTER — Telehealth: Payer: Self-pay | Admitting: *Deleted

## 2021-06-08 NOTE — Telephone Encounter (Signed)
Attempted to speak with pt today regarding her preference for either oral dilaudid or vaginal valium for her visit with Dr.Tucker. LVM for return call.

## 2021-06-09 ENCOUNTER — Encounter: Payer: Self-pay | Admitting: Pulmonary Disease

## 2021-06-09 ENCOUNTER — Ambulatory Visit (INDEPENDENT_AMBULATORY_CARE_PROVIDER_SITE_OTHER): Payer: Medicare Other | Admitting: Pulmonary Disease

## 2021-06-09 ENCOUNTER — Other Ambulatory Visit: Payer: Self-pay | Admitting: Gynecologic Oncology

## 2021-06-09 DIAGNOSIS — K219 Gastro-esophageal reflux disease without esophagitis: Secondary | ICD-10-CM

## 2021-06-09 DIAGNOSIS — J4540 Moderate persistent asthma, uncomplicated: Secondary | ICD-10-CM

## 2021-06-09 DIAGNOSIS — G8929 Other chronic pain: Secondary | ICD-10-CM

## 2021-06-09 MED ORDER — OMEPRAZOLE 20 MG PO CPDR: 20.0000 mg | DELAYED_RELEASE_CAPSULE | Freq: Every day | ORAL | 0 refills | Status: DC

## 2021-06-09 MED ORDER — HYDROMORPHONE HCL 2 MG PO TABS: 2.0000 mg | ORAL_TABLET | Freq: Once | ORAL | 0 refills | Status: AC

## 2021-06-09 MED ORDER — MONTELUKAST SODIUM 10 MG PO TABS: 10.0000 mg | ORAL_TABLET | Freq: Every day | ORAL | 3 refills | Status: DC

## 2021-06-09 NOTE — Assessment & Plan Note (Signed)
Appears to be persistent symptoms in spring and fall mainly triggered by allergies.  Another trigger is reflux.  Overall she has done well with only 1 exacerbation this year.  Refills will be provided on Singulair and omeprazole. I discussed rationale for use of Symbicort as needed strategy .  I think she should still continue to use twice daily Symbicort during spring and fall

## 2021-06-09 NOTE — Patient Instructions (Signed)
  Ok to take symbicort twice daily in spring & fall, as needed at other times

## 2021-06-09 NOTE — Assessment & Plan Note (Signed)
Refill on omeprazole. She will see an endocrinologist for osteoporosis

## 2021-06-09 NOTE — Progress Notes (Signed)
Oral dilaudid sent in for patient to take prior to her appt with Dr. Berline Lopes for a pelvic exam. She has been advised she will need a driver.

## 2021-06-09 NOTE — Progress Notes (Signed)
   Subjective:    Patient ID: [name], female    DOB: 02-14-1950, 71 y.o.   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  She is convinced that Singulair is causing reflux.  She tried stopping omeprazole but her reflux symptoms came back with a vengeance.  She is concerned about osteoporosis with long-term PPI usage.  This was confirmed on bone density testing and she has plans to see an endocrinologist.  She took Fosamax for 7 to 8 years.  She had a flareup of her symptoms in March, and to take Symbicort daily and required prednisone course and a steroid shot from the allergist.  She continues on Singulair  Her husband had 2 strokes, fall risk and his brain implant for essential tremors is not working      Significant tests/ events Spirometry 2010 normal CT sinuses 09/2013 normal   Review of Systems neg for any significant sore throat, dysphagia, itching, sneezing, nasal congestion or excess/ purulent secretions, fever, chills, sweats, unintended wt loss, pleuritic or exertional cp, hempoptysis, orthopnea pnd or change in chronic leg swelling. Also denies presyncope, palpitations, heartburn, abdominal pain, nausea, vomiting, diarrhea or change in bowel or urinary habits, dysuria,hematuria, rash, arthralgias, visual complaints, headache, numbness weakness or ataxia.     Objective:   Physical Exam  Gen. Pleasant, well-nourished, in no distress ENT - no thrush, no pallor/icterus,no post nasal drip Neck: No JVD, no thyromegaly, no carotid bruits Lungs: no use of accessory muscles, no dullness to percussion, clear without rales or rhonchi  Cardiovascular: Rhythm regular, heart sounds  normal, no murmurs or gallops, no peripheral  edema Musculoskeletal: No deformities, no cyanosis or clubbing        Assessment & Plan:

## 2021-06-11 ENCOUNTER — Inpatient Hospital Stay: Payer: Medicare Other | Attending: Gynecologic Oncology | Admitting: Gynecologic Oncology

## 2021-06-11 ENCOUNTER — Encounter: Payer: Self-pay | Admitting: Gynecologic Oncology

## 2021-06-11 ENCOUNTER — Telehealth: Payer: Self-pay | Admitting: *Deleted

## 2021-06-11 VITALS — BP 131/75 | HR 76 | Temp 97.6°F | Resp 18

## 2021-06-11 DIAGNOSIS — Z90710 Acquired absence of both cervix and uterus: Secondary | ICD-10-CM | POA: Insufficient documentation

## 2021-06-11 DIAGNOSIS — R110 Nausea: Secondary | ICD-10-CM | POA: Insufficient documentation

## 2021-06-11 DIAGNOSIS — J3081 Allergic rhinitis due to animal (cat) (dog) hair and dander: Secondary | ICD-10-CM | POA: Diagnosis not present

## 2021-06-11 DIAGNOSIS — C541 Malignant neoplasm of endometrium: Secondary | ICD-10-CM

## 2021-06-11 DIAGNOSIS — Z8542 Personal history of malignant neoplasm of other parts of uterus: Secondary | ICD-10-CM | POA: Diagnosis not present

## 2021-06-11 DIAGNOSIS — Z90722 Acquired absence of ovaries, bilateral: Secondary | ICD-10-CM | POA: Insufficient documentation

## 2021-06-11 DIAGNOSIS — Z923 Personal history of irradiation: Secondary | ICD-10-CM | POA: Diagnosis not present

## 2021-06-11 DIAGNOSIS — Z08 Encounter for follow-up examination after completed treatment for malignant neoplasm: Secondary | ICD-10-CM | POA: Diagnosis not present

## 2021-06-11 DIAGNOSIS — J3089 Other allergic rhinitis: Secondary | ICD-10-CM | POA: Diagnosis not present

## 2021-06-11 DIAGNOSIS — J301 Allergic rhinitis due to pollen: Secondary | ICD-10-CM | POA: Diagnosis not present

## 2021-06-11 MED ORDER — ONDANSETRON HCL 8 MG PO TABS
8.0000 mg | ORAL_TABLET | Freq: Once | ORAL | Status: AC
  Administered 2021-06-11: 8 mg via ORAL
  Filled 2021-06-11: qty 1

## 2021-06-11 MED ORDER — ONDANSETRON 8 MG PO TBDP
8.0000 mg | ORAL_TABLET | Freq: Once | ORAL | Status: DC
  Filled 2021-06-11: qty 1

## 2021-06-11 MED ORDER — ONDANSETRON 8 MG PO TBDP: 8.0000 mg | ORAL_TABLET | Freq: Once | ORAL | Status: DC

## 2021-06-11 MED ORDER — ONDANSETRON HCL 8 MG PO TABS
8.0000 mg | ORAL_TABLET | Freq: Once | ORAL | Status: DC
  Filled 2021-06-11: qty 1

## 2021-06-11 NOTE — Progress Notes (Signed)
Gynecologic Oncology Return Clinic Visit  06/11/21  Reason for Visit: Surveillance visit in the setting of high intermediate risk uterine cancer  Treatment History: Oncology History Overview Note  MSI-stable   Endometrial cancer (HCC)  02/27/2019 Initial Biopsy   Gr1 EMC on EMB   02/27/2019 Initial Diagnosis   Endometrial cancer (HCC)   03/05/2019 Imaging   Pelvic ultrasound: Uterus measures 7.9 x 5.7 x 3.7 cm with an endometrial lining of 2.9 cm.  Bilateral ovaries normal in size and shape.   03/20/2019 Surgery   Lsc LOA, TRH/BSO, SLN on R, left pelvic LND   03/20/2019 Cancer Staging   Staging form: Corpus Uteri - Carcinoma and Carcinosarcoma, AJCC 8th Edition - Clinical stage from 03/20/2019: FIGO Stage IB (cT1b, cN0, cM0) - Signed by Tucker, Katherine R, MD on 03/27/2019   05/06/2019 - 06/04/2019 Radiation Therapy   Site Technique Total Dose (Gy) Dose per Fx (Gy) Completed Fx Beam Energies  Vagina: Pelvis HDR-brachy 30/30 6 5/5 Ir-192     03/09/2020 Genetic Testing   Negative genetic testing on the CancerNext-Expanded+RNAinsight panel.  The CancerNext-Expanded gene panel offered by [facility] and includes sequencing and rearrangement analysis for the following 77 genes: AIP, ALK, APC*, ATM*, AXIN2, BAP1, BARD1, BLM, BMPR1A, BRCA1*, BRCA2*, BRIP1*, CDC73, CDH1*, CDK4, CDKN1B, CDKN2A, CHEK2*, CTNNA1, DICER1, FANCC, FH, FLCN, GALNT12, KIF1B, LZTR1, MAX, MEN1, MET, MLH1*, MSH2*, MSH3, MSH6*, MUTYH*, NBN, NF1*, NF2, NTHL1, PALB2*, PHOX2B, PMS2*, POT1, PRKAR1A, PTCH1, PTEN*, RAD51C*, RAD51D*, RB1, RECQL, RET, SDHA, SDHAF2, SDHB, SDHC, SDHD, SMAD4, SMARCA4, SMARCB1, SMARCE1, STK11, SUFU, TMEM127, TP53*, TSC1, TSC2, VHL and XRCC2 (sequencing and deletion/duplication); EGFR, EGLN1, HOXB13, KIT, MITF, PDGFRA, POLD1, and POLE (sequencing only); EPCAM and GREM1 (deletion/duplication only). DNA and RNA analyses performed for * genes. The report date was March 09, 2020.     Interval History: Doing  well.  Had one episode where she saw spot of bright red blood and some older appearing brown blood on her dilator when she thinks she "pushed the dilator into far".  Otherwise denies any vaginal bleeding or discharge.  Reports baseline bowel function.  Denies any bladder symptoms.  Denies any abdominal or pelvic pain.  She is headed to the beach this weekend with her family.  She and her husband are celebrating their anniversary later this month.  He unfortunately continues to have declining health.  Past Medical/Surgical History: Past Medical History:  Diagnosis Date   Allergic rhinitis    Arthritis    Asthma    Carpal tunnel syndrome on right    Endometrial cancer (HCC)    Family history of breast cancer    Family history of colon cancer    Family history of kidney cancer    Family history of stomach cancer    GERD (gastroesophageal reflux disease)    History of radiation therapy 06/04/2019   vaginal brachytherapy 05/06/2019-06/04/2019   Dr James Kinard   Hyperlipemia    PMB (postmenopausal bleeding)    Polio    Left leg   Unspecified essential hypertension    clearance Dr Wertmann with note on chart    Past Surgical History:  Procedure Laterality Date   BREAST CYST ASPIRATION Right 2004   BREAST EXCISIONAL BIOPSY Bilateral    BREAST SURGERY     bilateral fibroid tumors removed   CARPAL TUNNEL RELEASE     right   CESAREAN SECTION     x 2   COLON SURGERY     scar tissue removed   COLONOSCOPY       leg surgery     bilateral, numerous   LYMPH NODE DISSECTION N/A 03/20/2019   Procedure: LYMPH NODE DISSECTION;  Surgeon: Lafonda Mosses, MD;  Location: [facility];  Service: Gynecology;  Laterality: N/A;   ROBOTIC ASSISTED TOTAL HYSTERECTOMY WITH BILATERAL SALPINGO OOPHERECTOMY N/A 03/20/2019   Procedure: XI ROBOTIC ASSISTED TOTAL HYSTERECTOMY WITH BILATERAL SALPINGO OOPHORECTOMY;  Surgeon: Lafonda Mosses, MD;  Location: [facility];   Service: Gynecology;  Laterality: N/A;   SENTINEL NODE BIOPSY  03/20/2019   Procedure: SENTINEL NODE BIOPSY;  Surgeon: Lafonda Mosses, MD;  Location: [facility];  Service: Gynecology;;   tooth implant     titanium/ front upper right   TOTAL HIP ARTHROPLASTY     right   TOTAL HIP REVISION  08/15/2011   Procedure: TOTAL HIP REVISION;  Surgeon: Mauri Pole, MD;  Location: [facility];  Service: Orthopedics;  Laterality: Right;   vocal surgery     cyst removed   WRIST SURGERY  2009   left    Family History  Problem Relation Age of Onset   Kidney cancer Father 34   CAD Father    Bladder Cancer Father 44       ureter   Melanoma Mother 74       STAGE 3   Breast cancer Mother 47   Heart attack Mother    Colon cancer Maternal Grandfather    Breast cancer Maternal Grandmother        meta to stomach   Breast cancer Maternal Aunt 78        bilateral   Stomach cancer Paternal Grandmother 44   Breast cancer Maternal Aunt 105   Non-Hodgkin's lymphoma Cousin    Hodgkin's lymphoma Son    Rectal cancer Neg Hx     Social History   Socioeconomic History   Marital status: Married    Spouse name: Not on file   Number of children: 2   Years of education: Not on file   Highest education level: Not on file  Occupational History   Occupation: Music Minister    Comment: Teacher, early years/pre: [employer]  Tobacco Use   Smoking status: Never   Smokeless tobacco: Never  Vaping Use   Vaping Use: Never used  Substance and Sexual Activity   Alcohol use: Yes    Alcohol/week: 2.0 standard drinks of alcohol    Types: 1 Glasses of wine, 1 Cans of beer per week   Drug use: No   Sexual activity: Not Currently    Birth control/protection: Post-menopausal  Other Topics Concern   Not on file  Social History Narrative   Not on file   Social Determinants of Health   Financial Resource Strain: Not on file  Food Insecurity: Not on file  Transportation  Needs: Not on file  Physical Activity: Not on file  Stress: Not on file  Social Connections: Not on file    Current Medications:  Current Outpatient Medications:    albuterol (PROVENTIL) (2.5 MG/3ML) 0.083% nebulizer solution, Take 3 mLs (2.5 mg total) by nebulization every 6 (six) hours as needed for wheezing or shortness of breath., Disp: 75 vial, Rfl: 5   Ca Phosphate-Cholecalciferol (CALCIUM/VITAMIN D3 GUMMIES) 250-350 MG-UNIT CHEW, Chew 3 tablets by mouth daily., Disp: , Rfl:    cholecalciferol (VITAMIN D3) 25 MCG (1000 UNIT) tablet, Take by mouth once., Disp: , Rfl:    Cyanocobalamin (VITAMIN B-12 CR) 1500 MCG  TBCR, Take 1,000 mcg by mouth daily. , Disp: , Rfl:    EPINEPHrine 0.3 mg/0.3 mL IJ SOAJ injection, , Disp: , Rfl:    loratadine (CLARITIN) 10 MG tablet, Take 10 mg by mouth daily., Disp: , Rfl:    losartan-hydrochlorothiazide (HYZAAR) 100-25 MG per tablet, Take 1 tablet by mouth daily., Disp: , Rfl:    Multiple Vitamin (MULTIVITAMIN WITH MINERALS) TABS tablet, Take 1 tablet by mouth daily., Disp: , Rfl:    OVER THE COUNTER MEDICATION, Take 0.5 drops by mouth at bedtime. CBD Oil, 1/2 drop under the tongue nightly, Disp: , Rfl:    SYMBICORT 80-4.5 MCG/ACT inhaler, Inhale 1 puff into the lungs in the morning and at bedtime., Disp: 10.2 g, Rfl: 4   montelukast (SINGULAIR) 10 MG tablet, Take 1 tablet (10 mg total) by mouth at bedtime., Disp: 30 tablet, Rfl: 3   omeprazole (PRILOSEC) 20 MG capsule, Take 1 capsule (20 mg total) by mouth daily., Disp: 30 capsule, Rfl: 0  Review of Systems: Denies appetite changes, fevers, chills, fatigue, unexplained weight changes. Denies hearing loss, neck lumps or masses, mouth sores, ringing in ears or voice changes. Denies cough or wheezing.  Denies shortness of breath. Denies chest pain or palpitations. Denies leg swelling. Denies abdominal distention, pain, blood in stools, constipation, diarrhea, nausea, vomiting, or early satiety. Denies  pain with intercourse, dysuria, frequency, hematuria or incontinence. Denies hot flashes, pelvic pain, vaginal bleeding or vaginal discharge.   Denies joint pain, back pain or muscle pain/cramps. Denies itching, rash, or wounds. Denies dizziness, headaches, numbness or seizures. Denies swollen lymph nodes or glands, denies easy bruising or bleeding. Denies anxiety, depression, confusion, or decreased concentration.  Physical Exam: BP 131/75 (BP Location: Left Arm, Patient Position: Sitting)   Pulse 76   Temp 97.6 F (36.4 C) (Tympanic)   Resp 18   SpO2 96%  General: Alert, oriented, no acute distress. HEENT: Normocephalic, atraumatic, sclera anicteric. Chest: Clear to auscultation bilaterally.  No inspiratory or expiratory wheezing appreciated. Cardiovascular: Regular rate and rhythm, no murmurs. Abdomen: soft, nontender.  Normoactive bowel sounds.  No masses or hepatosplenomegaly appreciated.  Well-healed incisions. Extremities: Grossly normal range of motion although somewhat decreased mobility of her left lower extremity, at baseline, brace in place.  Warm, well perfused.  No edema bilaterally. Skin: No rashes or lesions noted. Lymphatics: No cervical, supraclavicular, or inguinal adenopathy. GU: Normal appearing external genitalia without erythema, excoriation, or lesions.  Speculum exam reveals moderately atrophic vaginal mucosa consistent with radiation changes, no lesions or masses.  Small speculum used.  Bimanual exam reveals no nodularity.  Rectovaginal exam confirms his exam findings  Laboratory & Radiologic Studies: None new  Assessment & Plan: [name] is a 71 y.o. 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 Finished adjuvant VBT in 06/2019. MS-stable. Negative genetic testing.   Patient is doing well and is NED on exam today.  There is no evidence of agglutination on exam.  We will plan either for oral  Dilaudid with an anti-emetic to be taken before she gets here or vaginal Valium.   She is now 2 years out from completing adjuvant treatment.  We have transitioned to visits every 6 months, which we will continue to do.  I will see her back in December.  We discussed signs and symptoms that would be concerning for disease recurrence, and the patient knows to call if she develops any of these sooner than her   next scheduled visit.    24 minutes of total time was spent for this patient encounter, including preparation, face-to-face counseling with the patient and coordination of care, and documentation of the encounter.  Jeral Pinch, MD  Division of Gynecologic Oncology  Department of Obstetrics and Gynecology  [facility]

## 2021-06-11 NOTE — Telephone Encounter (Signed)
Spoke with pt this afternoon who stated that she's just wondering what to do with the pain medication. Informed pt she can take it as per instructed before her appt. Per Dr.Tucker, she should not experience any nausea with it since it'll be slower to absorb in her system as oppose to the IM injection. If she experiences nausea here we can give her something for it here. Pt verbalized understanding and stated that she will eat a few nabs crackers with the pain medication to help with the possible nausea as well.

## 2021-06-11 NOTE — Patient Instructions (Addendum)
It was good to see you today.  I do not see or feel any evidence of cancer recurrence on your exam.  We will plan to have our next visit in 6 months.  My schedule is not out that far.  Please call back in October or November to schedule a visit to see me in December.  We will plan either to do the combination of oral Dilaudid and nausea medicine or try vaginal Valium.  As always, please call if any symptoms, before then.

## 2021-06-23 ENCOUNTER — Telehealth: Payer: Self-pay | Admitting: *Deleted

## 2021-06-23 NOTE — Telephone Encounter (Signed)
CALLED PATIENT TO RESCHEDULE FU ON 07-08-21 TO 09-09-21 @ 10:30 AM, LVM FOR A RETURN CALL

## 2021-06-25 DIAGNOSIS — J3089 Other allergic rhinitis: Secondary | ICD-10-CM | POA: Diagnosis not present

## 2021-06-25 DIAGNOSIS — J301 Allergic rhinitis due to pollen: Secondary | ICD-10-CM | POA: Diagnosis not present

## 2021-06-25 DIAGNOSIS — J3081 Allergic rhinitis due to animal (cat) (dog) hair and dander: Secondary | ICD-10-CM | POA: Diagnosis not present

## 2021-07-01 ENCOUNTER — Ambulatory Visit: Payer: Self-pay | Admitting: Radiation Oncology

## 2021-07-01 DIAGNOSIS — E559 Vitamin D deficiency, unspecified: Secondary | ICD-10-CM | POA: Diagnosis not present

## 2021-07-01 DIAGNOSIS — E010 Iodine-deficiency related diffuse (endemic) goiter: Secondary | ICD-10-CM | POA: Diagnosis not present

## 2021-07-01 DIAGNOSIS — I10 Essential (primary) hypertension: Secondary | ICD-10-CM | POA: Diagnosis not present

## 2021-07-01 DIAGNOSIS — M810 Age-related osteoporosis without current pathological fracture: Secondary | ICD-10-CM | POA: Diagnosis not present

## 2021-07-01 MED ORDER — OMEPRAZOLE 20 MG PO CPDR: 20.0000 mg | DELAYED_RELEASE_CAPSULE | Freq: Every day | ORAL | 5 refills | Status: DC

## 2021-07-01 NOTE — Telephone Encounter (Signed)
Rx for omeprazole was sent to pharmacy for pt. Nothing further needed.

## 2021-07-02 ENCOUNTER — Other Ambulatory Visit: Payer: Self-pay | Admitting: Endocrinology

## 2021-07-02 ENCOUNTER — Ambulatory Visit
Admission: RE | Admit: 2021-07-02 | Discharge: 2021-07-02 | Disposition: A | Discharge status: Home / Self Care | Payer: Medicare Other | Source: Ambulatory Visit | Attending: Endocrinology | Admitting: Endocrinology

## 2021-07-02 DIAGNOSIS — E010 Iodine-deficiency related diffuse (endemic) goiter: Secondary | ICD-10-CM | POA: Diagnosis not present

## 2021-07-08 ENCOUNTER — Ambulatory Visit: Payer: Medicare Other | Admitting: Radiation Oncology

## 2021-07-08 DIAGNOSIS — J301 Allergic rhinitis due to pollen: Secondary | ICD-10-CM | POA: Diagnosis not present

## 2021-07-08 DIAGNOSIS — J3089 Other allergic rhinitis: Secondary | ICD-10-CM | POA: Diagnosis not present

## 2021-07-08 DIAGNOSIS — J3081 Allergic rhinitis due to animal (cat) (dog) hair and dander: Secondary | ICD-10-CM | POA: Diagnosis not present

## 2021-07-14 DIAGNOSIS — J3081 Allergic rhinitis due to animal (cat) (dog) hair and dander: Secondary | ICD-10-CM | POA: Diagnosis not present

## 2021-07-14 DIAGNOSIS — J301 Allergic rhinitis due to pollen: Secondary | ICD-10-CM | POA: Diagnosis not present

## 2021-07-14 DIAGNOSIS — J3089 Other allergic rhinitis: Secondary | ICD-10-CM | POA: Diagnosis not present

## 2021-07-15 DIAGNOSIS — E010 Iodine-deficiency related diffuse (endemic) goiter: Secondary | ICD-10-CM | POA: Diagnosis not present

## 2021-07-15 DIAGNOSIS — E559 Vitamin D deficiency, unspecified: Secondary | ICD-10-CM | POA: Diagnosis not present

## 2021-07-21 DIAGNOSIS — M810 Age-related osteoporosis without current pathological fracture: Secondary | ICD-10-CM | POA: Diagnosis not present

## 2021-07-30 DIAGNOSIS — J301 Allergic rhinitis due to pollen: Secondary | ICD-10-CM | POA: Diagnosis not present

## 2021-07-30 DIAGNOSIS — J3081 Allergic rhinitis due to animal (cat) (dog) hair and dander: Secondary | ICD-10-CM | POA: Diagnosis not present

## 2021-07-30 DIAGNOSIS — J3089 Other allergic rhinitis: Secondary | ICD-10-CM | POA: Diagnosis not present

## 2021-08-04 DIAGNOSIS — J3089 Other allergic rhinitis: Secondary | ICD-10-CM | POA: Diagnosis not present

## 2021-08-04 DIAGNOSIS — J301 Allergic rhinitis due to pollen: Secondary | ICD-10-CM | POA: Diagnosis not present

## 2021-08-20 DIAGNOSIS — J3089 Other allergic rhinitis: Secondary | ICD-10-CM | POA: Diagnosis not present

## 2021-08-20 DIAGNOSIS — J3081 Allergic rhinitis due to animal (cat) (dog) hair and dander: Secondary | ICD-10-CM | POA: Diagnosis not present

## 2021-08-20 DIAGNOSIS — J301 Allergic rhinitis due to pollen: Secondary | ICD-10-CM | POA: Diagnosis not present

## 2021-08-31 ENCOUNTER — Telehealth: Payer: Self-pay | Admitting: *Deleted

## 2021-08-31 NOTE — Telephone Encounter (Signed)
CALLED PATIENT TO ALTER FU ON 09-09-21 DUE TO DR. KINARD BEING ON VACATION, RESCHEDULED FOR 09-23-21 @ 8 AM, LVM FOR A RETURN CALL

## 2021-09-01 DIAGNOSIS — J301 Allergic rhinitis due to pollen: Secondary | ICD-10-CM | POA: Diagnosis not present

## 2021-09-01 DIAGNOSIS — J3089 Other allergic rhinitis: Secondary | ICD-10-CM | POA: Diagnosis not present

## 2021-09-09 ENCOUNTER — Ambulatory Visit: Payer: Medicare Other | Admitting: Radiation Oncology

## 2021-09-10 DIAGNOSIS — J3089 Other allergic rhinitis: Secondary | ICD-10-CM | POA: Diagnosis not present

## 2021-09-10 DIAGNOSIS — J301 Allergic rhinitis due to pollen: Secondary | ICD-10-CM | POA: Diagnosis not present

## 2021-09-16 DIAGNOSIS — H02883 Meibomian gland dysfunction of right eye, unspecified eyelid: Secondary | ICD-10-CM | POA: Diagnosis not present

## 2021-09-16 DIAGNOSIS — H40033 Anatomical narrow angle, bilateral: Secondary | ICD-10-CM | POA: Diagnosis not present

## 2021-09-16 DIAGNOSIS — J301 Allergic rhinitis due to pollen: Secondary | ICD-10-CM | POA: Diagnosis not present

## 2021-09-16 DIAGNOSIS — J3089 Other allergic rhinitis: Secondary | ICD-10-CM | POA: Diagnosis not present

## 2021-09-16 DIAGNOSIS — H01023 Squamous blepharitis right eye, unspecified eyelid: Secondary | ICD-10-CM | POA: Diagnosis not present

## 2021-09-16 DIAGNOSIS — H40013 Open angle with borderline findings, low risk, bilateral: Secondary | ICD-10-CM | POA: Diagnosis not present

## 2021-09-20 DIAGNOSIS — E538 Deficiency of other specified B group vitamins: Secondary | ICD-10-CM | POA: Diagnosis not present

## 2021-09-20 DIAGNOSIS — E785 Hyperlipidemia, unspecified: Secondary | ICD-10-CM | POA: Diagnosis not present

## 2021-09-20 DIAGNOSIS — R7989 Other specified abnormal findings of blood chemistry: Secondary | ICD-10-CM | POA: Diagnosis not present

## 2021-09-20 DIAGNOSIS — M8580 Other specified disorders of bone density and structure, unspecified site: Secondary | ICD-10-CM | POA: Diagnosis not present

## 2021-09-20 DIAGNOSIS — R269 Unspecified abnormalities of gait and mobility: Secondary | ICD-10-CM | POA: Diagnosis not present

## 2021-09-20 DIAGNOSIS — I10 Essential (primary) hypertension: Secondary | ICD-10-CM | POA: Diagnosis not present

## 2021-09-20 DIAGNOSIS — G14 Postpolio syndrome: Secondary | ICD-10-CM | POA: Diagnosis not present

## 2021-09-23 ENCOUNTER — Telehealth: Payer: Self-pay | Admitting: *Deleted

## 2021-09-23 ENCOUNTER — Ambulatory Visit
Admission: RE | Admit: 2021-09-23 | Discharge: 2021-09-23 | Disposition: A | Discharge status: Home / Self Care | Payer: Medicare Other | Source: Ambulatory Visit | Attending: Radiation Oncology | Admitting: Radiation Oncology

## 2021-09-23 DIAGNOSIS — J301 Allergic rhinitis due to pollen: Secondary | ICD-10-CM | POA: Diagnosis not present

## 2021-09-23 DIAGNOSIS — J3081 Allergic rhinitis due to animal (cat) (dog) hair and dander: Secondary | ICD-10-CM | POA: Diagnosis not present

## 2021-09-23 DIAGNOSIS — J3089 Other allergic rhinitis: Secondary | ICD-10-CM | POA: Diagnosis not present

## 2021-09-23 NOTE — Telephone Encounter (Signed)
Called patient to ask about rescheduling missed fu appt. for 09-23-21, lvm for a return call

## 2021-09-27 DIAGNOSIS — R1013 Epigastric pain: Secondary | ICD-10-CM | POA: Diagnosis not present

## 2021-09-27 DIAGNOSIS — R269 Unspecified abnormalities of gait and mobility: Secondary | ICD-10-CM | POA: Diagnosis not present

## 2021-09-27 DIAGNOSIS — C541 Malignant neoplasm of endometrium: Secondary | ICD-10-CM | POA: Diagnosis not present

## 2021-09-27 DIAGNOSIS — M1990 Unspecified osteoarthritis, unspecified site: Secondary | ICD-10-CM | POA: Diagnosis not present

## 2021-09-27 DIAGNOSIS — M8580 Other specified disorders of bone density and structure, unspecified site: Secondary | ICD-10-CM | POA: Diagnosis not present

## 2021-09-27 DIAGNOSIS — J45909 Unspecified asthma, uncomplicated: Secondary | ICD-10-CM | POA: Diagnosis not present

## 2021-09-27 DIAGNOSIS — Z0000 Encounter for general adult medical examination without abnormal findings: Secondary | ICD-10-CM | POA: Diagnosis not present

## 2021-09-27 DIAGNOSIS — R82998 Other abnormal findings in urine: Secondary | ICD-10-CM | POA: Diagnosis not present

## 2021-09-27 DIAGNOSIS — E785 Hyperlipidemia, unspecified: Secondary | ICD-10-CM | POA: Diagnosis not present

## 2021-09-27 DIAGNOSIS — E538 Deficiency of other specified B group vitamins: Secondary | ICD-10-CM | POA: Diagnosis not present

## 2021-09-27 DIAGNOSIS — I10 Essential (primary) hypertension: Secondary | ICD-10-CM | POA: Diagnosis not present

## 2021-09-27 DIAGNOSIS — G14 Postpolio syndrome: Secondary | ICD-10-CM | POA: Diagnosis not present

## 2021-09-27 DIAGNOSIS — Z1331 Encounter for screening for depression: Secondary | ICD-10-CM | POA: Diagnosis not present

## 2021-09-30 DIAGNOSIS — J3081 Allergic rhinitis due to animal (cat) (dog) hair and dander: Secondary | ICD-10-CM | POA: Diagnosis not present

## 2021-09-30 DIAGNOSIS — J3089 Other allergic rhinitis: Secondary | ICD-10-CM | POA: Diagnosis not present

## 2021-09-30 DIAGNOSIS — J301 Allergic rhinitis due to pollen: Secondary | ICD-10-CM | POA: Diagnosis not present

## 2021-10-15 DIAGNOSIS — J3089 Other allergic rhinitis: Secondary | ICD-10-CM | POA: Diagnosis not present

## 2021-10-15 DIAGNOSIS — J3081 Allergic rhinitis due to animal (cat) (dog) hair and dander: Secondary | ICD-10-CM | POA: Diagnosis not present

## 2021-10-20 ENCOUNTER — Other Ambulatory Visit: Payer: Self-pay | Admitting: Pulmonary Disease

## 2021-10-21 DIAGNOSIS — J3089 Other allergic rhinitis: Secondary | ICD-10-CM | POA: Diagnosis not present

## 2021-10-21 DIAGNOSIS — J3081 Allergic rhinitis due to animal (cat) (dog) hair and dander: Secondary | ICD-10-CM | POA: Diagnosis not present

## 2021-10-21 DIAGNOSIS — J301 Allergic rhinitis due to pollen: Secondary | ICD-10-CM | POA: Diagnosis not present

## 2021-10-28 DIAGNOSIS — J301 Allergic rhinitis due to pollen: Secondary | ICD-10-CM | POA: Diagnosis not present

## 2021-10-28 DIAGNOSIS — J3089 Other allergic rhinitis: Secondary | ICD-10-CM | POA: Diagnosis not present

## 2021-10-29 ENCOUNTER — Telehealth: Payer: Self-pay | Admitting: *Deleted

## 2021-10-29 NOTE — Telephone Encounter (Signed)
RETURNED PATIENT'S PHONE CALL, SPOKE WITH PATIENT. ?

## 2021-11-10 DIAGNOSIS — J3081 Allergic rhinitis due to animal (cat) (dog) hair and dander: Secondary | ICD-10-CM | POA: Diagnosis not present

## 2021-11-10 DIAGNOSIS — J301 Allergic rhinitis due to pollen: Secondary | ICD-10-CM | POA: Diagnosis not present

## 2021-11-10 DIAGNOSIS — J3089 Other allergic rhinitis: Secondary | ICD-10-CM | POA: Diagnosis not present

## 2021-11-19 DIAGNOSIS — J301 Allergic rhinitis due to pollen: Secondary | ICD-10-CM | POA: Diagnosis not present

## 2021-11-19 DIAGNOSIS — J3081 Allergic rhinitis due to animal (cat) (dog) hair and dander: Secondary | ICD-10-CM | POA: Diagnosis not present

## 2021-11-19 DIAGNOSIS — J3089 Other allergic rhinitis: Secondary | ICD-10-CM | POA: Diagnosis not present

## 2021-11-24 DIAGNOSIS — J3081 Allergic rhinitis due to animal (cat) (dog) hair and dander: Secondary | ICD-10-CM | POA: Diagnosis not present

## 2021-11-24 DIAGNOSIS — J3089 Other allergic rhinitis: Secondary | ICD-10-CM | POA: Diagnosis not present

## 2021-11-24 DIAGNOSIS — J301 Allergic rhinitis due to pollen: Secondary | ICD-10-CM | POA: Diagnosis not present

## 2021-12-03 DIAGNOSIS — J301 Allergic rhinitis due to pollen: Secondary | ICD-10-CM | POA: Diagnosis not present

## 2021-12-03 DIAGNOSIS — J3081 Allergic rhinitis due to animal (cat) (dog) hair and dander: Secondary | ICD-10-CM | POA: Diagnosis not present

## 2021-12-03 DIAGNOSIS — J3089 Other allergic rhinitis: Secondary | ICD-10-CM | POA: Diagnosis not present

## 2021-12-10 DIAGNOSIS — J301 Allergic rhinitis due to pollen: Secondary | ICD-10-CM | POA: Diagnosis not present

## 2021-12-10 DIAGNOSIS — J3089 Other allergic rhinitis: Secondary | ICD-10-CM | POA: Diagnosis not present

## 2021-12-10 DIAGNOSIS — J3081 Allergic rhinitis due to animal (cat) (dog) hair and dander: Secondary | ICD-10-CM | POA: Diagnosis not present

## 2021-12-12 NOTE — Progress Notes (Signed)
Radiation Oncology         ([phone] ________________________________  Name: [name] MRN: [id number]  Date: 12/13/2021  DOB: 09/01/1950  Follow-Up Visit Note  CC: Shon Baton, MD  Maurice Small, MD  No diagnosis found.  Diagnosis: FIGO stage IB (cT1b, cN0, cM0) endometrioid endometrial adenocarcinoma, grade 1    Interval Since Last Radiation: 2 years, 6 months, and 10 days   Intent: Curative  Radiation Treatment Dates: 05/06/2019 through 06/04/2019 Site Technique Total Dose (Gy) Dose per Fx (Gy) Completed Fx Beam Energies  Vagina: Pelvis HDR-brachy 30/30 6 5/5 Ir-192   Narrative:  The patient returns today for routine follow-up, she was last seen here for follow-up on 06/29/20. (She did not show for her scheduled follow-up visit on 09/23/21).   Since her last visit, the patient followed up with Dr. Berline Lopes on 12/11/20. During which time, the patient denied any symptoms concerning for disease recurrence and she was noted to be NED on examination. She did endorse some baseline constipation which has remained unchanged. She also reported having a recent asthma exacerbation which had improved over the last several days (followed by Dr. Elsworth Soho).   During her most recent follow-up visit with Dr. Berline Lopes on 06/11/21, the patient reported an instance of blood on her vaginal dilator after use. She attributed this to pushing the dilator in too far. She otherwise denied any symptoms concerning for disease recurrence and was noted to be NED on examination.   Pertinent imaging performed in the interval includes the following:  -- Bilateral screening mammogram on 12/14/2020 showed no evidence of malignancy in either breast -- DXA for bone mineral density on 04/19/21 revealed a right radial T-score of -2.8, classifying the patient as osteoporotic.  -- Thyroid ultrasound on 07/02/21 for evaluation of suspected thyromegaly revealed a solitary right-sided thyroid cyst measuring 3.7 cm which does not  warrant tissue sampling. No other abnormalities were appreciated.    ***  Allergies:  is allergic to molds & smuts and latex.  Meds: Current Outpatient Medications  Medication Sig Dispense Refill   albuterol (PROVENTIL) (2.5 MG/3ML) 0.083% nebulizer solution Take 3 mLs (2.5 mg total) by nebulization every 6 (six) hours as needed for wheezing or shortness of breath. 75 vial 5   Ca Phosphate-Cholecalciferol (CALCIUM/VITAMIN D3 GUMMIES) 250-350 MG-UNIT CHEW Chew 3 tablets by mouth daily.     cholecalciferol (VITAMIN D3) 25 MCG (1000 UNIT) tablet Take by mouth once.     Cyanocobalamin (VITAMIN B-12 CR) 1500 MCG TBCR Take 1,000 mcg by mouth daily.      EPINEPHrine 0.3 mg/0.3 mL IJ SOAJ injection      loratadine (CLARITIN) 10 MG tablet Take 10 mg by mouth daily.     losartan-hydrochlorothiazide (HYZAAR) 100-25 MG per tablet Take 1 tablet by mouth daily.     montelukast (SINGULAIR) 10 MG tablet TAKE ONE TABLET BY MOUTH AT BEDTIME 30 tablet 3   Multiple Vitamin (MULTIVITAMIN WITH MINERALS) TABS tablet Take 1 tablet by mouth daily.     omeprazole (PRILOSEC) 20 MG capsule Take 1 capsule (20 mg total) by mouth daily. 30 capsule 5   OVER THE COUNTER MEDICATION Take 0.5 drops by mouth at bedtime. CBD Oil, 1/2 drop under the tongue nightly     SYMBICORT 80-4.5 MCG/ACT inhaler Inhale 1 puff into the lungs in the morning and at bedtime. 10.2 g 4   No current facility-administered medications for this encounter.    Physical Findings: The patient is in no acute distress.  Patient is alert and oriented.  vitals were not taken for this visit. .  No significant changes. Lungs are clear to auscultation bilaterally. Heart has regular rate and rhythm. No palpable cervical, supraclavicular, or axillary adenopathy. Abdomen soft, non-tender, normal bowel sounds.  On pelvic examination the external genitalia were unremarkable. A speculum exam was performed. There are no mucosal lesions noted in the vaginal vault. A  Pap smear was obtained of the proximal vagina. On bimanual and rectovaginal examination there were no pelvic masses appreciated. ***  Lab Findings: Lab Results  Component Value Date   WBC 6.5 03/18/2019   HGB 14.7 03/18/2019   HCT 43.3 03/18/2019   MCV 96.0 03/18/2019   PLT 371 03/18/2019    Radiographic Findings: No results found.  Impression: FIGO stage IB (cT1b, cN0, cM0) endometrioid endometrial adenocarcinoma, grade 1    The patient is recovering from the effects of radiation.  ***  Plan:  ***   *** minutes of total time was spent for this patient encounter, including preparation, face-to-face counseling with the patient and coordination of care, physical exam, and documentation of the encounter. ____________________________________  Blair Promise, PhD, MD  This document serves as a record of services personally performed by Gery Pray, MD. It was created on his behalf by Roney Mans, a trained medical scribe. The creation of this record is based on the scribe's personal observations and the provider's statements to them. This document has been checked and approved by the attending provider.

## 2021-12-13 ENCOUNTER — Encounter: Payer: Self-pay | Admitting: Radiation Oncology

## 2021-12-13 ENCOUNTER — Ambulatory Visit
Admission: RE | Admit: 2021-12-13 | Discharge: 2021-12-13 | Disposition: A | Discharge status: Home / Self Care | Payer: Medicare Other | Source: Ambulatory Visit | Attending: Radiation Oncology | Admitting: Radiation Oncology

## 2021-12-13 VITALS — BP 148/77 | HR 87 | Temp 97.7°F | Resp 18 | Ht 62.0 in | Wt 160.1 lb

## 2021-12-13 DIAGNOSIS — C541 Malignant neoplasm of endometrium: Secondary | ICD-10-CM | POA: Diagnosis not present

## 2021-12-13 DIAGNOSIS — Z923 Personal history of irradiation: Secondary | ICD-10-CM | POA: Diagnosis not present

## 2021-12-13 DIAGNOSIS — Z7951 Long term (current) use of inhaled steroids: Secondary | ICD-10-CM | POA: Diagnosis not present

## 2021-12-13 DIAGNOSIS — Z79899 Other long term (current) drug therapy: Secondary | ICD-10-CM | POA: Insufficient documentation

## 2021-12-13 DIAGNOSIS — K5900 Constipation, unspecified: Secondary | ICD-10-CM | POA: Diagnosis not present

## 2021-12-13 DIAGNOSIS — M810 Age-related osteoporosis without current pathological fracture: Secondary | ICD-10-CM | POA: Insufficient documentation

## 2021-12-13 DIAGNOSIS — Z8542 Personal history of malignant neoplasm of other parts of uterus: Secondary | ICD-10-CM | POA: Insufficient documentation

## 2021-12-13 MED ORDER — ONDANSETRON HCL 4 MG PO TABS
4.0000 mg | ORAL_TABLET | Freq: Once | ORAL | Status: AC
  Administered 2021-12-13: 4 mg via ORAL
  Filled 2021-12-13: qty 1

## 2021-12-13 MED ORDER — HYDROMORPHONE HCL 1 MG/ML IJ SOLN
0.7500 mg | Freq: Once | INTRAMUSCULAR | Status: AC
  Administered 2021-12-13: 0.75 mg via INTRAMUSCULAR
  Filled 2021-12-13: qty 1

## 2021-12-13 NOTE — Progress Notes (Signed)
TAREA SKILLMAN is here today for follow up post radiation to the pelvic.  They completed their radiation on: 06/04/19  Does the patient complain of any of the following:  Pain: No Abdominal bloating: No Diarrhea/Constipation: No Nausea/Vomiting: No Vaginal Discharge: No Blood in Urine or Stool: No Urinary Issues (dysuria/incomplete emptying/ incontinence/ increased frequency/urgency): No Does patient report using vaginal dilator 2-3 times a week and/or sexually active 2-3 weeks: Yes Post radiation skin changes: No   Additional comments if applicable:   BP (!) 585/27 (BP Location: Left Arm, Patient Position: Sitting)   Pulse 87   Temp 97.7 F (36.5 C) (Temporal)   Resp 18   Ht '5\' 2"'$  (1.575 m)   Wt 160 lb 2 oz (72.6 kg)   SpO2 99%   BMI 29.29 kg/m

## 2021-12-23 DIAGNOSIS — J301 Allergic rhinitis due to pollen: Secondary | ICD-10-CM | POA: Diagnosis not present

## 2021-12-23 DIAGNOSIS — J3089 Other allergic rhinitis: Secondary | ICD-10-CM | POA: Diagnosis not present

## 2021-12-23 DIAGNOSIS — J3081 Allergic rhinitis due to animal (cat) (dog) hair and dander: Secondary | ICD-10-CM | POA: Diagnosis not present

## 2021-12-30 DIAGNOSIS — J3089 Other allergic rhinitis: Secondary | ICD-10-CM | POA: Diagnosis not present

## 2021-12-30 DIAGNOSIS — J3081 Allergic rhinitis due to animal (cat) (dog) hair and dander: Secondary | ICD-10-CM | POA: Diagnosis not present

## 2021-12-30 DIAGNOSIS — J301 Allergic rhinitis due to pollen: Secondary | ICD-10-CM | POA: Diagnosis not present

## 2022-01-05 DIAGNOSIS — J301 Allergic rhinitis due to pollen: Secondary | ICD-10-CM | POA: Diagnosis not present

## 2022-01-05 DIAGNOSIS — J3081 Allergic rhinitis due to animal (cat) (dog) hair and dander: Secondary | ICD-10-CM | POA: Diagnosis not present

## 2022-01-05 DIAGNOSIS — J3089 Other allergic rhinitis: Secondary | ICD-10-CM | POA: Diagnosis not present

## 2022-01-16 ENCOUNTER — Other Ambulatory Visit: Payer: Self-pay | Admitting: Pulmonary Disease

## 2022-01-19 DIAGNOSIS — Z23 Encounter for immunization: Secondary | ICD-10-CM | POA: Diagnosis not present

## 2022-02-09 ENCOUNTER — Other Ambulatory Visit: Payer: Self-pay | Admitting: Internal Medicine

## 2022-02-09 DIAGNOSIS — Z1231 Encounter for screening mammogram for malignant neoplasm of breast: Secondary | ICD-10-CM

## 2022-02-09 DIAGNOSIS — M810 Age-related osteoporosis without current pathological fracture: Secondary | ICD-10-CM | POA: Diagnosis not present

## 2022-02-10 ENCOUNTER — Ambulatory Visit
Admission: RE | Admit: 2022-02-10 | Discharge: 2022-02-10 | Disposition: A | Discharge status: Home / Self Care | Payer: Medicare Other | Source: Ambulatory Visit | Attending: Internal Medicine | Admitting: Internal Medicine

## 2022-02-10 DIAGNOSIS — Z1231 Encounter for screening mammogram for malignant neoplasm of breast: Secondary | ICD-10-CM | POA: Diagnosis not present

## 2022-02-14 DIAGNOSIS — J301 Allergic rhinitis due to pollen: Secondary | ICD-10-CM | POA: Diagnosis not present

## 2022-02-14 DIAGNOSIS — J3089 Other allergic rhinitis: Secondary | ICD-10-CM | POA: Diagnosis not present

## 2022-02-14 DIAGNOSIS — D0472 Carcinoma in situ of skin of left lower limb, including hip: Secondary | ICD-10-CM | POA: Diagnosis not present

## 2022-02-14 DIAGNOSIS — C4372 Malignant melanoma of left lower limb, including hip: Secondary | ICD-10-CM | POA: Diagnosis not present

## 2022-02-14 DIAGNOSIS — D485 Neoplasm of uncertain behavior of skin: Secondary | ICD-10-CM | POA: Diagnosis not present

## 2022-02-14 DIAGNOSIS — L820 Inflamed seborrheic keratosis: Secondary | ICD-10-CM | POA: Diagnosis not present

## 2022-02-14 DIAGNOSIS — J3081 Allergic rhinitis due to animal (cat) (dog) hair and dander: Secondary | ICD-10-CM | POA: Diagnosis not present

## 2022-02-15 ENCOUNTER — Other Ambulatory Visit: Payer: Self-pay | Admitting: Pulmonary Disease

## 2022-02-18 ENCOUNTER — Other Ambulatory Visit: Payer: Self-pay | Admitting: Pulmonary Disease

## 2022-02-21 DIAGNOSIS — J3089 Other allergic rhinitis: Secondary | ICD-10-CM | POA: Diagnosis not present

## 2022-02-21 DIAGNOSIS — J3081 Allergic rhinitis due to animal (cat) (dog) hair and dander: Secondary | ICD-10-CM | POA: Diagnosis not present

## 2022-02-21 DIAGNOSIS — J301 Allergic rhinitis due to pollen: Secondary | ICD-10-CM | POA: Diagnosis not present

## 2022-03-01 DIAGNOSIS — Z85820 Personal history of malignant melanoma of skin: Secondary | ICD-10-CM | POA: Diagnosis not present

## 2022-03-01 DIAGNOSIS — C4372 Malignant melanoma of left lower limb, including hip: Secondary | ICD-10-CM | POA: Diagnosis not present

## 2022-03-10 DIAGNOSIS — J3089 Other allergic rhinitis: Secondary | ICD-10-CM | POA: Diagnosis not present

## 2022-03-10 DIAGNOSIS — J301 Allergic rhinitis due to pollen: Secondary | ICD-10-CM | POA: Diagnosis not present

## 2022-03-16 DIAGNOSIS — K219 Gastro-esophageal reflux disease without esophagitis: Secondary | ICD-10-CM | POA: Diagnosis not present

## 2022-03-16 DIAGNOSIS — J301 Allergic rhinitis due to pollen: Secondary | ICD-10-CM | POA: Diagnosis not present

## 2022-03-16 DIAGNOSIS — J3081 Allergic rhinitis due to animal (cat) (dog) hair and dander: Secondary | ICD-10-CM | POA: Diagnosis not present

## 2022-03-16 DIAGNOSIS — J4530 Mild persistent asthma, uncomplicated: Secondary | ICD-10-CM | POA: Diagnosis not present

## 2022-03-16 DIAGNOSIS — J3089 Other allergic rhinitis: Secondary | ICD-10-CM | POA: Diagnosis not present

## 2022-03-17 DIAGNOSIS — L821 Other seborrheic keratosis: Secondary | ICD-10-CM | POA: Diagnosis not present

## 2022-03-17 DIAGNOSIS — Z85820 Personal history of malignant melanoma of skin: Secondary | ICD-10-CM | POA: Diagnosis not present

## 2022-03-17 DIAGNOSIS — L820 Inflamed seborrheic keratosis: Secondary | ICD-10-CM | POA: Diagnosis not present

## 2022-03-19 NOTE — Progress Notes (Signed)
[name], [name] ([id number]) [id number] Page 1 of 8 Visit Report for 03/21/2022 Allergy List Details Patient Name: Date of Service: GO SAVAYAH, SCHACHNER. 03/21/2022 8:00 A M Medical Record Number: [id number] Patient Account Number: [id number] Date of Birth/Sex: Treating RN: 19-Dec-1950 (72 y.o. Marta Lamas Primary Care Win Guajardo: Shon Baton Other Clinician: Referring Trashaun Streight: Treating Dashonda Bonneau/Extender: [name] in Treatment: 0 Allergies Active Allergies mold house dust latex Allergy Notes Electronic Signature(s) Signed: 03/21/2022 5:59:57 PM By: Dellie Catholic RN Previous Signature: 03/18/2022 4:22:15 PM Version By: Blanche East RN Entered By: Dellie Catholic on 03/21/2022 08:16:43 -------------------------------------------------------------------------------- Arrival Information Details Patient Name: Date of Service: [name]. 03/21/2022 8:00 A M Medical Record Number: [id number] Patient Account Number: [id number] Date of Birth/Sex: Treating RN: 1950/03/05 (72 y.o. [name] Primary Care Marlon Suleiman: Shon Baton Other Clinician: Referring Ashland Osmer: Treating Alexi Geibel/Extender: [name] in Treatment: 0 Visit Information Patient Arrived: Wheel Chair Arrival Time: 08:12 Accompanied By: Pearson Grippe Transfer Assistance: Manual Patient Identification Verified: Yes Electronic Signature(s) Signed: 03/21/2022 5:59:57 PM By: Dellie Catholic RN Entered By: Dellie Catholic on 03/21/2022 08:12:33 -------------------------------------------------------------------------------- Clinic Level of Care Assessment Details Patient Name: Date of Service: GO MAKENYA, SFERRAZZA. 03/21/2022 8:00 A M Medical Record Number: [id number] Patient Account Number: [id number] Date of Birth/Sex: Treating RN: [birth year] (72 y.o. [name] Primary Care Beckett Maden: Shon Baton Other Clinician: Referring  Macarena Langseth: Treating Laurana Magistro/Extender: [name] in Treatment: 0 Clinic Level of Care Assessment Items TOOL 1 Quantity Score X- 1 0 Use when EandM and Procedure is performed on INITIAL visit ASSESSMENTS - Nursing Assessment / Reassessment X- 1 20 General Physical Exam (combine w/ comprehensive assessment (listed just below) when performed on new pt. [address] CircleSHYREE, PIECZYNSKI ([id number]) [id number] Page 2 of 8 X- 1 25 Comprehensive Assessment (HX, ROS, Risk Assessments, Wounds Hx, etc.) ASSESSMENTS - Wound and Skin Assessment / Reassessment X- 1 10 Dermatologic / Skin Assessment (not related to wound area) ASSESSMENTS - Ostomy and/or Continence Assessment and Care []  - 0 Incontinence Assessment and Management []  - 0 Ostomy Care Assessment and Management (repouching, etc.) PROCESS - Coordination of Care []  - 0 Simple Patient / Family Education for ongoing care X- 1 20 Complex (extensive) Patient / Family Education for ongoing care X- 1 10 Staff obtains Programmer, systems, Records, T Results / Process Orders est X- 1 10 Staff telephones HHA, Nursing Homes / Clarify orders / etc []  - 0 Routine Transfer to another Facility (non-emergent condition) []  - 0 Routine Hospital Admission (non-emergent condition) X- 1 15 New Admissions / Biomedical engineer / Ordering NPWT Apligraf, etc. , []  - 0 Emergency Hospital Admission (emergent condition) PROCESS - Special Needs []  - 0 Pediatric / Minor Patient Management []  - 0 Isolation Patient Management []  - 0 Hearing / Language / Visual special needs []  - 0 Assessment of Community assistance (transportation, D/C planning, etc.) []  - 0 Additional assistance / Altered mentation []  - 0 Support Surface(s) Assessment (bed, cushion, seat, etc.) INTERVENTIONS - Miscellaneous []  - 0 External ear exam []  - 0 Patient Transfer (multiple staff / Civil Service fast streamer / Similar devices) X- 1 5 Simple Staple  / Suture removal (25 or less) []  - 0 Complex Staple / Suture removal (26 or more) []  - 0 Hypo/Hyperglycemic Management (do not check if billed separately) []  - 0 Ankle / Brachial Index (ABI) - do not check if billed separately Has the patient been seen at the hospital  within the last three years: Yes Total Score: 115 Level Of Care: New/Established - Level 3 Electronic Signature(s) Signed: 03/21/2022 5:59:57 PM By: Dellie Catholic RN Entered By: Dellie Catholic on 03/21/2022 12:35:27 -------------------------------------------------------------------------------- Encounter Discharge Information Details Patient Name: Date of Service: Arthur Holms M. 03/21/2022 8:00 A M Medical Record Number: [id number] Patient Account Number: [id number] Date of Birth/Sex: Treating RN: December 27, 1950 (72 y.o. [name] Primary Care Meranda Dechaine: Shon Baton Other Clinician: Referring Inette Doubrava: Treating Allis Quirarte/Extender: [name] in Treatment: 0 Encounter Discharge Information Items Post Procedure Vitals Discharge Condition: Stable Temperature (F): 97.8 Ambulatory Status: Wheelchair Pulse (bpm): 83 Discharge Destination: Home Respiratory Rate (breaths/min): 16 Transportation: Private Auto Blood Pressure (mmHg): 122/78 Accompanied By: step son Schedule Follow-up Appointment: NAKESIA, MCNEES ([id number]) [id number] Page 3 of 8 Clinical Summary of Care: Patient Declined Electronic Signature(s) Signed: 03/21/2022 5:59:57 PM By: Dellie Catholic RN Entered By: Dellie Catholic on 03/21/2022 12:36:56 -------------------------------------------------------------------------------- Lower Extremity Assessment Details Patient Name: Date of Service: [name]. 03/21/2022 8:00 A M Medical Record Number: [id number] Patient Account Number: [id number] Date of Birth/Sex: Treating RN: Jul 25, 1950 (72 y.o. [name] Primary Care Philander Ake:  Shon Baton Other Clinician: Referring Otila Starn: Treating Samay Delcarlo/Extender: [name] in Treatment: 0 Edema Assessment Assessed: Shirlyn Goltz: No] Patrice Paradise: No] [Left: Edema] [Right: :] Calf Left: Right: Point of Measurement: 27 cm From Medial Instep 29.5 cm Ankle Left: Right: Point of Measurement: 9 cm From Medial Instep 24 cm Knee To Floor Left: Right: From Medial Instep 35 cm Vascular Assessment Pulses: Dorsalis Pedis Palpable: [Left:Yes] Notes Unable to obtain ABI due to location of the wound Electronic Signature(s) Signed: 03/21/2022 5:59:57 PM By: Dellie Catholic RN Entered By: Dellie Catholic on 03/21/2022 08:40:44 -------------------------------------------------------------------------------- Multi Wound Chart Details Patient Name: Date of Service: Arthur Holms M. 03/21/2022 8:00 A M Medical Record Number: [id number] Patient Account Number: [id number] Date of Birth/Sex: Treating RN: 09-28-1950 (72 y.o. F) Primary Care Telisa Ohlsen: Shon Baton Other Clinician: Referring Sweta Halseth: Treating Lynda Capistran/Extender: [name] in Treatment: 0 Vital Signs Height(in): 62 Pulse(bpm): 83 Weight(lbs): 148 Blood Pressure(mmHg): 122/78 Body Mass Index(BMI): 27.1 Temperature(F): 97.8 Respiratory Rate(breaths/min): 16 [1:Photos:] [N/A:N/A] Left, Anterior Lower Leg N/A N/A Wound Location: Surgical Injury N/A N/A Wounding Event: Dehisced Wound N/A N/A Primary Etiology: Asthma, Hypertension, Received N/A N/A Comorbid History: Chemotherapy, Received Radiation 03/01/2022 N/A N/A Date Acquired: 0 N/A N/A Weeks of Treatment: Open N/A N/A Wound Status: No N/A N/A Wound Recurrence: 7.6x2.7x0.1 N/A N/A Measurements L x W x D (cm) 16.116 N/A N/A A (cm) : rea 1.612 N/A N/A Volume (cm) : Full Thickness Without Exposed N/A N/A Classification: Support Structures Medium N/A N/A Exudate A mount: Serosanguineous N/A N/A Exudate  Type: red, brown N/A N/A Exudate Color: Distinct, outline attached N/A N/A Wound Margin: Medium (34-66%) N/A N/A Granulation A mount: Red, Hyper-granulation N/A N/A Granulation Quality: Medium (34-66%) N/A N/A Necrotic A mount: Fat Layer (Subcutaneous Tissue): Yes N/A N/A Exposed Structures: None N/A N/A Epithelialization: Debridement - Excisional N/A N/A Debridement: Pre-procedure Verification/Time Out 08:50 N/A N/A Taken: Lidocaine 4% Topical Solution N/A N/A Pain Control: Subcutaneous, Slough N/A N/A Tissue Debrided: Skin/Subcutaneous Tissue N/A N/A Level: 20.52 N/A N/A Debridement A (sq cm): rea Curette, Forceps, Scissors N/A N/A Instrument: Minimum N/A N/A Bleeding: Pressure N/A N/A Hemostasis A chieved: 0 N/A N/A Procedural Pain: 0 N/A N/A Post Procedural Pain: Procedure was tolerated well N/A N/A Debridement Treatment Response: 7.8x2.7x0.1 N/A N/A  Post Debridement Measurements L x W x D (cm) 1.654 N/A N/A Post Debridement Volume: (cm) Scarring: Yes N/A N/A Periwound Skin Texture: No Abnormalities Noted N/A N/A Periwound Skin Moisture: No Abnormalities Noted N/A N/A Periwound Skin Color: No Abnormality N/A N/A Temperature: Debridement N/A N/A Procedures Performed: Treatment Notes Electronic Signature(s) Signed: 03/21/2022 12:10:44 PM By: Kalman Shan DO Entered By: Kalman Shan on 03/21/2022 09:00:33 -------------------------------------------------------------------------------- Multi-Disciplinary Care Plan Details Patient Name: Date of Service: Arthur Holms M. 03/21/2022 8:00 A M Medical Record Number: [id number] Patient Account Number: [id number] Date of Birth/Sex: Treating RN: 1950/05/08 (72 y.o. [name] Primary Care Sarabella Caprio: Shon Baton Other Clinician: Referring Darian Ace: Treating Laycee Fitzsimmons/Extender: [name] in Treatment: 0 Active Inactive Wound/Skin Impairment Nursing  Diagnoses: Impaired tissue integrity SUKANYA, ENZ ([id number]) [id number] Page 5 of 8 Goals: Patient/caregiver will verbalize understanding of skin care regimen Date Initiated: 03/21/2022 Target Resolution Date: 07/03/2022 Goal Status: Active Interventions: Assess ulceration(s) every visit Treatment Activities: Skin care regimen initiated : 03/21/2022 Notes: Electronic Signature(s) Signed: 03/21/2022 5:59:57 PM By: Dellie Catholic RN Entered By: Dellie Catholic on 03/21/2022 12:33:58 -------------------------------------------------------------------------------- Pain Assessment Details Patient Name: Date of Service: [name]. 03/21/2022 8:00 A M Medical Record Number: [id number] Patient Account Number: [id number] Date of Birth/Sex: Treating RN: [birth year] (72 y.o. [name] Primary Care Kameelah Minish: Shon Baton Other Clinician: Referring Domanick Cuccia: Treating Zabdi Mis/Extender: [name] in Treatment: 0 Active Problems Location of Pain Severity and Description of Pain Patient Has Paino Yes Site Locations Pain Location: Generalized Pain With Dressing Change: No Duration of the Pain. Constant / Intermittento Constant Rate the pain. Current Pain Level: 1 Worst Pain Level: 10 Least Pain Level: 1 Tolerable Pain Level: 1 Character of Pain Describe the Pain: Stabbing Pain Management and Medication Current Pain Management: Medication: Yes Cold Application: No Rest: Yes Massage: No Activity: No T.E.N.S.: No Heat Application: No Leg drop or elevation: No Is the Current Pain Management Adequate: Adequate How does your wound impact your activities of daily livingo Sleep: No Bathing: No Appetite: No Relationship With Others: No Bladder Continence: No Emotions: No Bowel Continence: No Work: No Toileting: No Drive: No Dressing: No Hobbies: No Electronic Signature(s) Signed: 03/21/2022 5:59:57 PM By:  Dellie Catholic RN Vergara,Signed: M ([id number]) PM By: Dellie Catholic RN Keir 03/21/2022 5:59:57 [id number] Page 6 of 8 Entered By: Dellie Catholic on 03/21/2022 08:39:48 -------------------------------------------------------------------------------- Patient/Caregiver Education Details Patient Name: Date of Service: GO TUNISHA, MINICOZZI 3/18/2024andnbsp8:00 [place] Record Number: [id number] Patient Account Number: [id number] Date of Birth/Gender: Treating RN: 04-16-1950 (72 y.o. [name] Primary Care Physician: Shon Baton Other Clinician: Referring Physician: Treating Physician/Extender: [name] in Treatment: 0 Education Assessment Education Provided To: Patient Education Topics Provided Wound/Skin Impairment: Methods: Explain/Verbal Responses: Return demonstration correctly Electronic Signature(s) Signed: 03/21/2022 5:59:57 PM By: Dellie Catholic RN Entered By: Dellie Catholic on 03/21/2022 12:34:07 -------------------------------------------------------------------------------- Wound Assessment Details Patient Name: Date of Service: [name]. 03/21/2022 8:00 A M Medical Record Number: [id number] Patient Account Number: [id number] Date of Birth/Sex: Treating RN: [birth year] (72 y.o. [name] Primary Care Gola Bribiesca: Shon Baton Other Clinician: Referring Ivey Nembhard: Treating Chiara Coltrin/Extender: [name] in Treatment: 0 Wound Status Wound Number: 1 Primary Dehisced Wound Etiology: Wound Location: Left, Anterior Lower Leg Wound Status: Open Wounding Event: Surgical Injury Notes: pt. had a Melanoma removed by Dr. Sarajane Jews Date Acquired: 03/01/2022 Comorbid Asthma, Hypertension, Received Chemotherapy, Received  Weeks Of Treatment: 0 History: Radiation Clustered Wound: No Photos Wound Measurements Length: (cm) 7.6 Width: (cm) 2.7 Depth: (cm) 0.1 Area: (cm)  16.116 Volume: (cm) 1.612 % Reduction in Area: % Reduction in Volume: Epithelialization: None Tunneling: No Undermining: No Wound Description LINZY, ALEXANDRA ([id number]) Classification: Full Thickness Without Exposed Support Structures Wound Margin: Distinct, outline attached Exudate Amount: Medium Exudate Type: Serosanguineous Exudate Color: red, brown [id number] Page 7 of 8 Foul Odor After Cleansing: No Slough/Fibrino Yes Wound Bed Granulation Amount: Medium (34-66%) Exposed Structure Granulation Quality: Red, Hyper-granulation Fat Layer (Subcutaneous Tissue) Exposed: Yes Necrotic Amount: Medium (34-66%) Necrotic Quality: Adherent Slough Periwound Skin Texture Texture Color No Abnormalities Noted: No No Abnormalities Noted: Yes Scarring: Yes Temperature / Pain Temperature: No Abnormality Moisture No Abnormalities Noted: Yes Treatment Notes Wound #1 (Lower Leg) Wound Laterality: Left, Anterior Cleanser Soap and Water Discharge Instruction: May shower and wash wound with dial antibacterial soap and water prior to dressing change. Vashe 5.8 (oz) Discharge Instruction: Cleanse the wound with Vashe prior to applying a clean dressing using gauze sponges, not tissue or cotton balls. Wound Cleanser Discharge Instruction: Cleanse the wound with wound cleanser prior to applying a clean dressing using gauze sponges, not tissue or cotton balls. Peri-Wound Care Sween Lotion (Moisturizing lotion) Discharge Instruction: Apply moisturizing lotion as directed Topical Primary Dressing Hydrofera Blue Ready Transfer Foam, 4x5 (in/in) Discharge Instruction: Apply to wound bed as instructed Santyl Ointment Discharge Instruction: Apply nickel thick amount to wound bed as instructed Secondary Dressing Woven Gauze Sponge, Non-Sterile 4x4 in Discharge Instruction: Apply over primary dressing as directed. Secured With [employer] 4x5  (in/yd) Discharge Instruction: Secure with Coban as directed. Kerlix Roll Sterile, 4.5x3.1 (in/yd) Discharge Instruction: Secure with Kerlix as directed. Transpore Surgical Tape, 2x10 (in/yd) Discharge Instruction: Secure dressing with tape as directed. Compression Wrap Compression Stockings Add-Ons Electronic Signature(s) Signed: 03/21/2022 5:41:46 PM By: Deon Pilling RN, BSN Signed: 03/21/2022 5:59:57 PM By: Dellie Catholic RN Entered By: Deon Pilling on 03/21/2022 08:37:58 -------------------------------------------------------------------------------- Vitals Details Patient Name: Date of Service: GO Alisa Graff M. 03/21/2022 8:00 A Julieta Gutting, Rodman Pickle ([id number] Page 8 of 8 Medical Record Number: [id number] Patient Account Number: [id number] Date of Birth/Sex: Treating RN: [birth year] (72 y.o. [name] Primary Care Giannah Zavadil: Shon Baton Other Clinician: Referring Zayed Griffie: Treating Vanda Waskey/Extender: [name] in Treatment: 0 Vital Signs Time Taken: 08:11 Temperature (F): 97.8 Height (in): 62 Pulse (bpm): 83 Source: Stated Respiratory Rate (breaths/min): 16 Weight (lbs): 148 Blood Pressure (mmHg): 122/78 Body Mass Index (BMI): 27.1 Reference Range: 80 - 120 mg / dl Electronic Signature(s) Signed: 03/21/2022 5:59:57 PM By: Dellie Catholic RN Entered By: Dellie Catholic on 03/21/2022 08:15:38

## 2022-03-21 ENCOUNTER — Encounter (HOSPITAL_BASED_OUTPATIENT_CLINIC_OR_DEPARTMENT_OTHER): Payer: Medicare Other | Attending: Internal Medicine | Admitting: Internal Medicine

## 2022-03-21 DIAGNOSIS — C541 Malignant neoplasm of endometrium: Secondary | ICD-10-CM | POA: Insufficient documentation

## 2022-03-21 DIAGNOSIS — G14 Postpolio syndrome: Secondary | ICD-10-CM | POA: Diagnosis not present

## 2022-03-21 DIAGNOSIS — I10 Essential (primary) hypertension: Secondary | ICD-10-CM | POA: Diagnosis not present

## 2022-03-21 DIAGNOSIS — J45909 Unspecified asthma, uncomplicated: Secondary | ICD-10-CM | POA: Insufficient documentation

## 2022-03-21 DIAGNOSIS — I890 Lymphedema, not elsewhere classified: Secondary | ICD-10-CM | POA: Diagnosis not present

## 2022-03-21 DIAGNOSIS — Z85820 Personal history of malignant melanoma of skin: Secondary | ICD-10-CM | POA: Diagnosis not present

## 2022-03-21 DIAGNOSIS — E785 Hyperlipidemia, unspecified: Secondary | ICD-10-CM | POA: Diagnosis not present

## 2022-03-21 DIAGNOSIS — L97822 Non-pressure chronic ulcer of other part of left lower leg with fat layer exposed: Secondary | ICD-10-CM | POA: Diagnosis not present

## 2022-03-21 DIAGNOSIS — Z9221 Personal history of antineoplastic chemotherapy: Secondary | ICD-10-CM | POA: Diagnosis not present

## 2022-03-21 DIAGNOSIS — X58XXXA Exposure to other specified factors, initial encounter: Secondary | ICD-10-CM | POA: Insufficient documentation

## 2022-03-21 DIAGNOSIS — T8131XA Disruption of external operation (surgical) wound, not elsewhere classified, initial encounter: Secondary | ICD-10-CM | POA: Insufficient documentation

## 2022-03-21 DIAGNOSIS — C439 Malignant melanoma of skin, unspecified: Secondary | ICD-10-CM | POA: Insufficient documentation

## 2022-03-21 DIAGNOSIS — Z923 Personal history of irradiation: Secondary | ICD-10-CM | POA: Insufficient documentation

## 2022-03-22 NOTE — Progress Notes (Signed)
APURVA, GAROFALO ([id number]) [id number] Page 1 of 8 Visit Report for 03/21/2022 Chief Complaint Document Details Patient Name: Date of Service: Desiree HYNLEE, STUDER. 03/21/2022 8:00 A M Medical Record Number: [id number] Patient Account Number: [id number] Date of Birth/Sex: Treating RN: [birth year] (72 y.o. F) Primary Care Provider: Shon Baton Other Clinician: Referring Provider: Treating Provider/Extender: [name] in Treatment: 0 Information Obtained from: Patient Chief Complaint 03/21/2022; left lower extremity wound Electronic Signature(s) Signed: 03/21/2022 12:10:44 PM By: Kalman Shan DO Entered By: Kalman Shan on 03/21/2022 09:00:58 -------------------------------------------------------------------------------- Debridement Details Patient Name: Date of Service: [name]. 03/21/2022 8:00 A M Medical Record Number: [id number] Patient Account Number: [id number] Date of Birth/Sex: Treating RN: 09-24-1950 (72 y.o. [name] Primary Care Provider: Shon Baton Other Clinician: Referring Provider: Treating Provider/Extender: [name] in Treatment: 0 Debridement Performed for Assessment: Wound #1 Left,Anterior Lower Leg Performed By: Physician Kalman Shan, DO Debridement Type: Debridement Level of Consciousness (Pre-procedure): Awake and Alert Pre-procedure Verification/Time Out Yes - 08:50 Taken: Start Time: 08:50 Pain Control: Lidocaine 4% T opical Solution T Area Debrided (L x W): otal 7.6 (cm) x 2.7 (cm) = 20.52 (cm) Tissue and other material debrided: Non-Viable, Slough, Subcutaneous, Slough Level: Skin/Subcutaneous Tissue Debridement Description: Excisional Instrument: Curette, Forceps, Scissors Bleeding: Minimum Hemostasis Achieved: Pressure End Time: 08:52 Procedural Pain: 0 Post Procedural Pain: 0 Response to Treatment: Procedure was tolerated well Level of  Consciousness (Post- Awake and Alert procedure): Post Debridement Measurements of Total Wound Length: (cm) 7.8 Width: (cm) 2.7 Depth: (cm) 0.1 Volume: (cm) 1.654 Character of Wound/Ulcer Post Debridement: Improved Post Procedure Diagnosis Same as Pre-procedure Electronic Signature(s) Signed: 03/21/2022 12:10:44 PM By: Kalman Shan DO Signed: 03/21/2022 5:59:57 PM By: Dellie Catholic RN Entered By: Dellie Catholic on 03/21/2022 08:55:56 Adele Schilder ([id number]) [id number] Page 2 of 8 -------------------------------------------------------------------------------- HPI Details Patient Name: Date of Service: [name], [name]. 03/21/2022 8:00 A M Medical Record Number: [id number] Patient Account Number: [id number] Date of Birth/Sex: Treating RN: 02-23-1950 (72 y.o. F) Primary Care Provider: Shon Baton Other Clinician: Referring Provider: Treating Provider/Extender: [name] in Treatment: 0 History of Present Illness HPI Description: 03/21/2022 Ms. Cyndra Gundy is a 72 year old female with a past medical history of postpolio syndrome, endometrial cancer and essential hypertension that presents the clinic for a 1 month history of nonhealing ulcer to the left lower extremity. She states that she had a melanoma removed from the left lower extremity on 2/27 by Dr. Sarajane Jews. After the surgery the wound dehisced. They have been using compression wraps. It is unclear what dressing has been used. She has no issues or complaints today. She denies signs of infection. Electronic Signature(s) Signed: 03/21/2022 12:10:44 PM By: Kalman Shan DO Entered By: Kalman Shan on 03/21/2022 09:07:07 -------------------------------------------------------------------------------- Physical Exam Details Patient Name: Date of Service: [name]. 03/21/2022 8:00 A M Medical Record Number: [id number] Patient Account Number: [id number] Date of  Birth/Sex: Treating RN: 06/12/1950 (72 y.o. F) Primary Care Provider: Shon Baton Other Clinician: Referring Provider: Treating Provider/Extender: [name] in Treatment: 0 Constitutional respirations regular, non-labored and within target range for patient.. Cardiovascular 2+ dorsalis pedis/posterior tibialis pulses. Psychiatric pleasant and cooperative. Notes Left lower extremity: T the anterior aspect there is an open wound with several sutures still in place however the entire wound is dehisced. It is trying to fill in o and has flattened out. Nonviable tissue and granulation  tissue present. 2+ pitting edema to the knee. Electronic Signature(s) Signed: 03/21/2022 12:10:44 PM By: Kalman Shan DO Entered By: Kalman Shan on 03/21/2022 09:07:43 -------------------------------------------------------------------------------- Physician Orders Details Patient Name: Date of Service: [name]. 03/21/2022 8:00 A M Medical Record Number: [id number] Patient Account Number: [id number] Date of Birth/Sex: Treating RN: [birth year] (72 y.o. [name] Primary Care Provider: Shon Baton Other Clinician: Referring Provider: Treating Provider/Extender: [name] in Treatment: 0 Verbal / Phone Orders: No Diagnosis Coding ICD-10 Coding Code Description [phone] Non-pressure chronic ulcer of other part of left lower leg with fat layer exposed C43.9 Malignant melanoma of skin, unspecified C54.1 Malignant neoplasm of endometrium [name], [name] ([id number]) [id number] Page 3 of 8 G14 Postpolio syndrome I89.0 Lymphedema, not elsewhere classified Follow-up Appointments ppointment in 1 week. - Dr. Heber P[place] Room 7 Return A Anesthetic (In clinic) Topical Lidocaine 4% applied to wound bed - used in clinic Bathing/ Shower/ Hygiene Other Bathing/Shower/Hygiene Orders/Instructions: - Please keep the left  leg wraps dry (for a week) Edema Control - Lymphedema / SCD / Other Avoid standing for long periods of time. Exercise regularly - Walk/exercise a s tolerated throughout the day Moisturize legs daily. Off-Loading Other: - Elevate left leg as tolerated throughout the day. Wound Treatment Wound #1 - Lower Leg Wound Laterality: Left, Anterior Cleanser: Soap and Water Discharge Instructions: May shower and wash wound with dial antibacterial soap and water prior to dressing change. Cleanser: Vashe 5.8 (oz) Discharge Instructions: Cleanse the wound with Vashe prior to applying a clean dressing using gauze sponges, not tissue or cotton balls. Cleanser: Wound Cleanser Discharge Instructions: Cleanse the wound with wound cleanser prior to applying a clean dressing using gauze sponges, not tissue or cotton balls. Peri-Wound Care: Sween Lotion (Moisturizing lotion) Discharge Instructions: Apply moisturizing lotion as directed Prim Dressing: Hydrofera Blue Ready Transfer Foam, 4x5 (in/in) ary Discharge Instructions: Apply to wound bed as instructed Prim Dressing: Santyl Ointment ary Discharge Instructions: Apply nickel thick amount to wound bed as instructed Secondary Dressing: Woven Gauze Sponge, Non-Sterile 4x4 in Discharge Instructions: Apply over primary dressing as directed. Secured With: Coban Self-Adherent Wrap 4x5 (in/yd) Discharge Instructions: Secure with Coban as directed. Secured With: [employer], 4.5x3.1 (in/yd) Discharge Instructions: Secure with Kerlix as directed. Secured With: Transpore Surgical Tape, 2x10 (in/yd) Discharge Instructions: Secure dressing with tape as directed. Electronic Signature(s) Signed: 03/21/2022 12:10:44 PM By: Kalman Shan DO Entered By: Kalman Shan on 03/21/2022 09:07:51 -------------------------------------------------------------------------------- Problem List Details Patient Name: Date of Service: [name]. 03/21/2022 8:00  A M Medical Record Number: [id number] Patient Account Number: [id number] Date of Birth/Sex: Treating RN: Nov 06, 1950 (72 y.o. F) Primary Care Provider: Shon Baton Other Clinician: Referring Provider: Treating Provider/Extender: [name] in Treatment: 0 Active Problems ICD-10 Encounter Code Description Active Date MDM Diagnosis [name], [name] ([id number]) [id number] Page 4 of 8 [phone] Non-pressure chronic ulcer of other part of left lower leg with fat layer exposed3/18/2024 No Yes T81.31XA Disruption of external operation (surgical) wound, not elsewhere classified, 03/21/2022 No Yes initial encounter C43.9 Malignant melanoma of skin, unspecified 03/21/2022 No Yes G14 Postpolio syndrome 03/21/2022 No Yes C54.1 Malignant neoplasm of endometrium 03/21/2022 No Yes I89.0 Lymphedema, not elsewhere classified 03/21/2022 No Yes Inactive Problems Resolved Problems Electronic Signature(s) Signed: 03/21/2022 12:10:44 PM By: Kalman Shan DO Entered By: Kalman Shan on 03/21/2022 09:00:28 -------------------------------------------------------------------------------- Progress Note Details Patient Name: Date of Service: Desiree Alisa Graff M. 03/21/2022 8:00 A M  Medical Record Number: [id number] Patient Account Number: [id number] Date of Birth/Sex: Treating RN: 26-Feb-1950 (72 y.o. F) Primary Care Provider: Shon Baton Other Clinician: Referring Provider: Treating Provider/Extender: [name] in Treatment: 0 Subjective Chief Complaint Information obtained from Patient 03/21/2022; left lower extremity wound History of Present Illness (HPI) 03/21/2022 Ms. Aveanna Orrick is a 72 year old female with a past medical history of postpolio syndrome, endometrial cancer and essential hypertension that presents the clinic for a 1 month history of nonhealing ulcer to the left lower extremity. She states that she had a melanoma  removed from the left lower extremity on 2/27 by Dr. Sarajane Jews. After the surgery the wound dehisced. They have been using compression wraps. It is unclear what dressing has been used. She has no issues or complaints today. She denies signs of infection. Patient History Information obtained from Patient. Allergies mold, house dust, latex Family History Cancer - Mother,Father,Siblings,Child,Paternal Grandparents,Maternal Grandparents, Heart Disease - Mother. Social History Never smoker, Marital Status - Married, Alcohol Use - Rarely, Drug Use - No History, Caffeine Use - Never. Medical History Respiratory Patient has history of Asthma Cardiovascular Patient has history of Hypertension - Unspecified essential HTN Oncologic [name], [name] [id number] ([id number]) [id number] Page 5 of 8 Patient has history of Received Chemotherapy, Received Radiation - 05/06/19-06/04/19 Vaginal Brachytherapy (endometrialcancer) Hospitalization/Surgery History - Robotic assisted total hysterectomy; Right T hip. otal Medical A Surgical History Notes nd Cardiovascular hyperlipidemia Musculoskeletal Hx: Polio -Left leg wears a brace Neurologic [id number] Review of Systems (ROS) Ear/Nose/Mouth/Throat Complains or has symptoms of Chronic sinus problems or rhinitis. Oncologic endometrial cancer General Notes: Left wrist "has a plate" 2009; Has a "titanium" tooth Objective Constitutional respirations regular, non-labored and within target range for patient.. Vitals Time Taken: 8:11 AM, Height: 62 in, Source: Stated, Weight: 148 lbs, BMI: 27.1, Temperature: 97.8 F, Pulse: 83 bpm, Respiratory Rate: 16 breaths/min, Blood Pressure: 122/78 mmHg. Cardiovascular 2+ dorsalis pedis/posterior tibialis pulses. Psychiatric pleasant and cooperative. General Notes: Left lower extremity: T the anterior aspect there is an open wound with several sutures still in place however the entire wound is dehisced.  It is o trying to fill in and has flattened out. Nonviable tissue and granulation tissue present. 2+ pitting edema to the knee. Integumentary (Hair, Skin) Wound #1 status is Open. Original cause of wound was Surgical Injury. The date acquired was: 03/01/2022. The wound is located on the Left,Anterior Lower Leg. The wound measures 7.6cm length x 2.7cm width x 0.1cm depth; 16.116cm^2 area and 1.612cm^3 volume. There is Fat Layer (Subcutaneous Tissue) exposed. There is no tunneling or undermining noted. There is a medium amount of serosanguineous drainage noted. The wound margin is distinct with the outline attached to the wound base. There is medium (34-66%) red, hyper - granulation within the wound bed. There is a medium (34-66%) amount of necrotic tissue within the wound bed including Adherent Slough. The periwound skin appearance had no abnormalities noted for moisture. The periwound skin appearance had no abnormalities noted for color. The periwound skin appearance exhibited: Scarring. Periwound temperature was noted as No Abnormality. Assessment Active Problems ICD-10 Non-pressure chronic ulcer of other part of left lower leg with fat layer exposed Disruption of external operation (surgical) wound, not elsewhere classified, initial encounter Malignant melanoma of skin, unspecified Postpolio syndrome Malignant neoplasm of endometrium Lymphedema, not elsewhere classified Patient presents with a 1 month history of nonhealing ulcer to her left lower extremity status post melanoma removal with wound dehiscence. I debrided nonviable tissue. I removed 1  suture at this time. I recommended Santyl and Hydrofera Blue to the wound. This will be under Kerlix/Coban to help with edema control. Patient has strong pedal pulses suggesting adequate blood flow for healing. She knows to not get the wrap wet or keep this on for more than 7 days. Procedures Wound #1 Pre-procedure diagnosis of Wound #1 is a  Dehisced Wound located on the Left,Anterior Lower Leg . There was a Excisional Skin/Subcutaneous Tissue Debridement with a total area of 20.52 sq cm performed by Kalman Shan, DO. With the following instrument(s): Curette, Forceps, and Scissors to remove Non-Viable tissue/material. Material removed includes Subcutaneous Tissue and Slough and after achieving pain control using Lidocaine 4% T opical Solution. No specimens were taken. A time out was conducted at 08:50, prior to the start of the procedure. A Minimum amount of bleeding was controlled with Pressure. The procedure was tolerated well with a pain level of 0 throughout and a pain level of 0 following the procedure. Post Debridement Measurements: [name], [name] ([id number]) [id number] Page 6 of 8 7.8cm length x 2.7cm width x 0.1cm depth; 1.654cm^3 volume. Character of Wound/Ulcer Post Debridement is improved. Post procedure Diagnosis Wound #1: Same as Pre-Procedure Plan Follow-up Appointments: Return Appointment in 1 week. - Dr. Heber Martinez Room 7 Anesthetic: (In clinic) Topical Lidocaine 4% applied to wound bed - used in clinic Bathing/ Shower/ Hygiene: Other Bathing/Shower/Hygiene Orders/Instructions: - Please keep the left leg wraps dry (for a week) Edema Control - Lymphedema / SCD / Other: Avoid standing for long periods of time. Exercise regularly - Walk/exercise a s tolerated throughout the day Moisturize legs daily. Off-Loading: Other: - Elevate left leg as tolerated throughout the day. WOUND #1: - Lower Leg Wound Laterality: Left, Anterior Cleanser: Soap and Water Discharge Instructions: May shower and wash wound with dial antibacterial soap and water prior to dressing change. Cleanser: Vashe 5.8 (oz) Discharge Instructions: Cleanse the wound with Vashe prior to applying a clean dressing using gauze sponges, not tissue or cotton balls. Cleanser: Wound Cleanser Discharge Instructions: Cleanse the  wound with wound cleanser prior to applying a clean dressing using gauze sponges, not tissue or cotton balls. Peri-Wound Care: Sween Lotion (Moisturizing lotion) Discharge Instructions: Apply moisturizing lotion as directed Prim Dressing: Hydrofera Blue Ready Transfer Foam, 4x5 (in/in) ary Discharge Instructions: Apply to wound bed as instructed Prim Dressing: Santyl Ointment ary Discharge Instructions: Apply nickel thick amount to wound bed as instructed Secondary Dressing: Woven Gauze Sponge, Non-Sterile 4x4 in Discharge Instructions: Apply over primary dressing as directed. Secured With: Coban Self-Adherent Wrap 4x5 (in/yd) Discharge Instructions: Secure with Coban as directed. Secured With: [employer], 4.5x3.1 (in/yd) Discharge Instructions: Secure with Kerlix as directed. Secured With: Transpore Surgical T ape, 2x10 (in/yd) Discharge Instructions: Secure dressing with tape as directed. 1. In office sharp debridement 2. Hydrofera Blue and Santyl under Kerlix/Coban 3. Follow-up in 1 week Electronic Signature(s) Signed: 03/21/2022 12:10:44 PM By: Kalman Shan DO Entered By: Kalman Shan on 03/21/2022 09:10:02 -------------------------------------------------------------------------------- HxROS Details Patient Name: Date of Service: [name]. 03/21/2022 8:00 A M Medical Record Number: [id number] Patient Account Number: [id number] Date of Birth/Sex: Treating RN: [birth year] (72 y.o. Marta Lamas Primary Care Provider: Shon Baton Other Clinician: Referring Provider: Treating Provider/Extender: [name] in Treatment: 0 Information Obtained From Patient Ear/Nose/Mouth/Throat Complaints and Symptoms: Positive for: Chronic sinus problems or rhinitis Respiratory Medical History: Positive for: Asthma Cardiovascular [name], [name] ([id number]) [id number] Page 7 of 8 Medical History: Positive  for:  Hypertension - Unspecified essential HTN Past Medical History Notes: hyperlipidemia Musculoskeletal Medical History: Past Medical History Notes: Hx: Polio -Left leg wears a brace Neurologic Medical History: Past Medical History Notes: [id number] Oncologic Complaints and Symptoms: Review of System Notes: endometrial cancer Medical History: Positive for: Received Chemotherapy; Received Radiation - 05/06/19-06/04/19 Vaginal Brachytherapy (endometrialcancer) Immunizations Pneumococcal Vaccine: Received Pneumococcal Vaccination: Yes Received Pneumococcal Vaccination On or After 60th Birthday: Yes Implantable Devices Yes Hospitalization / Surgery History Type of Hospitalization/Surgery Robotic assisted total hysterectomy; Right T hip otal Family and Social History Cancer: Yes - Mother,Father,Siblings,Child,Paternal Grandparents,Maternal Grandparents; Heart Disease: Yes - Mother; Never smoker; Marital Status - Married; Alcohol Use: Rarely; Drug Use: No History; Caffeine Use: Never; Financial Concerns: No; Food, Clothing or Shelter Needs: No; Support System Lacking: No; Transportation Concerns: No Notes Left wrist "has a plate" 2009; Has a "titanium" tooth Electronic Signature(s) Signed: 03/21/2022 12:10:44 PM By: Kalman Shan DO Signed: 03/21/2022 5:06:20 PM By: Blanche East RN Signed: 03/21/2022 5:59:57 PM By: Dellie Catholic RN Entered By: Dellie Catholic on 03/21/2022 08:28:20 -------------------------------------------------------------------------------- SuperBill Details Patient Name: Date of Service: [name]. 03/21/2022 Medical Record Number: [id number] Patient Account Number: [id number] Date of Birth/Sex: Treating RN: [birth year] (72 y.o. F) Primary Care Provider: Shon Baton Other Clinician: Referring Provider: Treating Provider/Extender: [name] in Treatment: 0 Diagnosis Coding ICD-10 Codes Code Description [id number] Non-pressure  chronic ulcer of other part of left lower leg with fat layer exposed T81.31XA Disruption of external operation (surgical) wound, not elsewhere classified, initial encounter C43.9 Malignant melanoma of skin, unspecified G14 Postpolio syndrome C54.1 Malignant neoplasm of endometrium [name], [name] ([id number]) [id number] Page 8 of 8 I89.0 Lymphedema, not elsewhere classified Facility Procedures : CPT4 Code: [id number] Description: [id number] - WOUND CARE VISIT-LEV 3 EST PT Modifier: 25 Quantity: 1 : CPT4 Code: [id number] Description: [id number] - DEB SUBQ TISSUE 20 SQ CM/< ICD-10 Diagnosis Description L97.822 Non-pressure chronic ulcer of other part of left lower leg with fat layer exposed T81.31XA Disruption of external operation (surgical) wound, not elsewhere classified,  initi Modifier: al encounter Quantity: 1 : CPT4 Code: [id number] Description: [id number] - DEB SUBQ TISS EA ADDL 20CM ICD-10 Diagnosis Description L97.822 Non-pressure chronic ulcer of other part of left lower leg with fat layer exposed T81.31XA Disruption of external operation (surgical) wound, not elsewhere classified,  initi Modifier: al encounter Quantity: 1 Physician Procedures : CPT4 Code Description Modifier [id number] - WC PHYS LEVEL 4 - NEW PT ICD-10 Diagnosis Description L97.822 Non-pressure chronic ulcer of other part of left lower leg with fat layer exposed T81.31XA Disruption of external operation (surgical) wound,  not elsewhere classified, initial encounter C43.9 Malignant melanoma of skin, unspecified I89.0 Lymphedema, not elsewhere classified Quantity: 1 : [id number] - WC PHYS SUBQ TISS 20 SQ CM ICD-10 Diagnosis Description L97.822 Non-pressure chronic ulcer of other part of left lower leg with fat layer exposed T81.31XA Disruption of external operation (surgical) wound, not elsewhere classified,  initial encounter Quantity: 1 : [id number] [id number] - WC PHYS SUBQ TISS EA ADDL 20 CM ICD-10  Diagnosis Description L97.822 Non-pressure chronic ulcer of other part of left lower leg with fat layer exposed T81.31XA Disruption of external operation (surgical) wound, not elsewhere classified,  initial encounter Quantity: 1 Electronic Signature(s) Signed: 03/21/2022 4:17:30 PM By: Kalman Shan DO Signed: 03/21/2022 5:59:57 PM By: Dellie Catholic RN Previous Signature: 03/21/2022 12:10:44 PM Version By: Kalman Shan DO Entered By: Dellie Catholic on 03/21/2022 12:35:47

## 2022-03-22 NOTE — Progress Notes (Signed)
[name], [name] ([id number]) [id number] Page 1 of 4 Visit Report for 03/21/2022 Abuse Risk Screen Details Patient Name: Date of Service: [name], [name]. 03/21/2022 8:00 A M Medical Record Number: [id number] Patient Account Number: [id number] Date of Birth/Sex: Treating RN: 03-22-1950 (72 y.o. [name] Primary Care [name]: [name] [name]: Referring [name]: Treating [name]/Extender: [name] in Treatment: 0 Abuse Risk Screen Items Answer ABUSE RISK SCREEN: Has anyone close to you tried to hurt or harm you recentlyo No Do you feel uncomfortable with anyone in your familyo No Has anyone forced you do things that you didnt want to doo No Electronic Signature(s) Signed: 03/21/2022 5:59:57 PM By: Desiree Catholic RN Entered By: [name] on 03/21/2022 08:28:29 -------------------------------------------------------------------------------- Activities of Daily Living Details Patient Name: Date of Service: [name], [name]. 03/21/2022 8:00 A M Medical Record Number: [id number] Patient Account Number: [id number] Date of Birth/Sex: Treating RN: February 10, 1950 (72 y.o. [name] Primary Care Beauden Tremont: [name] [name]: Referring Kaj Vasil: Treating [name]/Extender: [name] in Treatment: 0 Activities of Daily Living Items Answer Activities of Daily Living (Please select one for each item) Drive Automobile Completely Able T Medications ake Completely Able Use T elephone Completely Able Care for Appearance Completely Able Use T oilet Completely Able Bath / Shower Completely Able Dress Self Completely Able Feed Self Completely Able Walk Completely Able Get In / Out Bed Completely Able Housework Completely Able Prepare Meals Completely [place] for Self Completely Able Electronic Signature(s) Signed: 03/21/2022 5:59:57 PM  By: Desiree Catholic RN Entered By: [name] on 03/21/2022 08:29:14 -------------------------------------------------------------------------------- Education Screening Details Patient Name: Date of Service: [name]. 03/21/2022 8:00 A M Medical Record Number: [id number] Patient Account Number: [id number] Date of Birth/Sex: Treating RN: May 16, 1950 (72 y.o. [name] Primary Care Nitara Szczerba: [name] [name]: Referring Jorgeluis Gurganus: Treating [name]/Extender: [name] in Treatment: 0 [name], [name] [name] ([id number]) [id number] Page 2 of 4 Primary Learner Assessed: Patient Learning Preferences/Education Level/Primary Language Learning Preference: Explanation, Demonstration Highest Education Level: College or Above Preferred Language: English Cognitive Barrier Language Barrier: No Translator Needed: No Memory Deficit: No Emotional Barrier: No Cultural/Religious Beliefs Affecting Medical Care: No Physical Barrier Impaired Vision: No Impaired Hearing: No Decreased Hand dexterity: No Knowledge/Comprehension Knowledge Level: High Comprehension Level: High Ability to understand written instructions: High Ability to understand verbal instructions: High Motivation Anxiety Level: Calm Cooperation: Cooperative Education Importance: Acknowledges Need Interest in Health Problems: Asks Questions Perception: Coherent Willingness to Engage in Self-Management High Activities: Readiness to Engage in Self-Management High Activities: Electronic Signature(s) Signed: 03/21/2022 5:59:57 PM By: Desiree Catholic RN Entered By: [name] on 03/21/2022 08:30:05 -------------------------------------------------------------------------------- Fall Risk Assessment Details Patient Name: Date of Service: [name]. 03/21/2022 8:00 A M Medical Record Number: [id number] Patient Account Number: [id number] Date  of Birth/Sex: Treating RN: 08-29-1950 (72 y.o. [name] Primary Care Glema Takaki: [name] [name]: Referring Jasyah Theurer: Treating Kimiah Hibner/Extender: [name] in Treatment: 0 Fall Risk Assessment Items Have you had 2 or more falls in the last 12 monthso 0 No Have you had any fall that resulted in injury in the last 12 monthso 0 No FALLS RISK SCREEN History of falling - immediate or within 3 months 0 No Secondary diagnosis (Do you have 2 or more medical diagnoseso) 0 No Ambulatory aid None/bed rest/wheelchair/nurse 0 No Crutches/cane/walker 0 No  Furniture 0 No Intravenous therapy Access/Saline/Heparin Lock 0 No Gait/Transferring Normal/ bed rest/ wheelchair 0 No Weak (short steps with or without shuffle, stooped but able to lift head while walking, may seek 0 No support from furniture) Impaired (short steps with shuffle, may have difficulty arising from chair, head down, impaired 0 No balance) Mental Status Oriented to own ability 0 No Overestimates or forgets limitations 0 No Risk Level: Low Risk Score: 0 [name], [name] ([id number]) [phone] [id number] Page 3 of 4 Electronic Signature(s) -------------------------------------------------------------------------------- Foot Assessment Details Patient Name: Date of Service: [name], LITTLEPAGE. 03/21/2022 8:00 A M Medical Record Number: [id number] Patient Account Number: [id number] Date of Birth/Sex: Treating RN: July 16, 1950 (72 y.o. [name] Primary Care Zariana Strub: [name] [name]: Referring Quandarius Nill: Treating Charlesetta Milliron/Extender: [name] in Treatment: 0 Foot Assessment Items Site Locations + = Sensation present, - = Sensation absent, C = Callus, U = Ulcer R = Redness, W = Warmth, M = Maceration, PU = Pre-ulcerative lesion F = Fissure, S = Swelling, D = Dryness Assessment Right: Left: Desiree Deformity: No No Prior  Foot Ulcer: No No Prior Amputation: No No Charcot Joint: No No Ambulatory Status: Ambulatory With Help Assistance Device: Wheelchair Gait: Steady Electronic Signature(s) Signed: 03/21/2022 5:59:57 PM By: Desiree Catholic RN Entered By: [name] on 03/21/2022 08:34:31 -------------------------------------------------------------------------------- Nutrition Risk Screening Details Patient Name: Date of Service: [name], SABATINE. 03/21/2022 8:00 A M Medical Record Number: [id number] Patient Account Number: [id number] Date of Birth/Sex: Treating RN: 1950/11/03 (72 y.o. [name] Primary Care Cathaleen Korol: [name] [name]: Referring Ben Sanz: Treating Rossie Bretado/Extender: [name] in Treatment: 0 Height (in): 62 Weight (lbs): 148 Body Mass Index (BMI): 27.1 [name], [name] ([id number]) [phone] [id number] Page 4 of 4 Nutrition Risk Screening Items Score Screening NUTRITION RISK SCREEN: I have an illness or condition that made me change the kind and/or amount of food I eat 0 No I eat fewer than two meals per day 0 No I eat few fruits and vegetables, or milk products 0 No I have three or more drinks of beer, liquor or wine almost every day 0 No I have tooth or mouth problems that make it hard for me to eat 0 No I don't always have enough money to buy the food I need 0 No I eat alone most of the time 0 No I take three or more different prescribed or over-the-counter drugs a day 0 No Without wanting to, I have lost or gained 10 pounds in the last six months 0 No I am not always physically able to shop, cook and/or feed myself 0 No Nutrition Protocols Good Risk Protocol 0 No interventions needed Moderate Risk Protocol High Risk Proctocol Risk Level: Good Risk Score: 0 Electronic Signature(s) Signed: 03/21/2022 5:59:57 PM By: Desiree Catholic RN Entered By: [name] on 03/21/2022 08:34:07

## 2022-03-23 DIAGNOSIS — J3089 Other allergic rhinitis: Secondary | ICD-10-CM | POA: Diagnosis not present

## 2022-03-23 DIAGNOSIS — J3081 Allergic rhinitis due to animal (cat) (dog) hair and dander: Secondary | ICD-10-CM | POA: Diagnosis not present

## 2022-03-23 DIAGNOSIS — J301 Allergic rhinitis due to pollen: Secondary | ICD-10-CM | POA: Diagnosis not present

## 2022-03-28 ENCOUNTER — Encounter (HOSPITAL_BASED_OUTPATIENT_CLINIC_OR_DEPARTMENT_OTHER): Payer: Medicare Other | Admitting: Internal Medicine

## 2022-03-28 DIAGNOSIS — I890 Lymphedema, not elsewhere classified: Secondary | ICD-10-CM | POA: Diagnosis not present

## 2022-03-28 DIAGNOSIS — C439 Malignant melanoma of skin, unspecified: Secondary | ICD-10-CM | POA: Diagnosis not present

## 2022-03-28 DIAGNOSIS — L97822 Non-pressure chronic ulcer of other part of left lower leg with fat layer exposed: Secondary | ICD-10-CM | POA: Diagnosis not present

## 2022-03-28 DIAGNOSIS — G14 Postpolio syndrome: Secondary | ICD-10-CM | POA: Diagnosis not present

## 2022-03-28 DIAGNOSIS — T8131XA Disruption of external operation (surgical) wound, not elsewhere classified, initial encounter: Secondary | ICD-10-CM | POA: Diagnosis not present

## 2022-03-28 DIAGNOSIS — C541 Malignant neoplasm of endometrium: Secondary | ICD-10-CM | POA: Diagnosis not present

## 2022-03-28 NOTE — Progress Notes (Signed)
MINAHIL, HULTS ([id number]) [id number] Page 1 of 8 Visit Report for 03/28/2022 Chief Complaint Document Details Patient Name: Date of Service: [name], [name]. 03/28/2022 1:30 PM Medical Record Number: [id number] Patient Account Number: [id number] Date of Birth/Sex: Treating RN: [birth year] (72 y.o. F) Primary Care Provider: Shon Baton Other Clinician: Referring Provider: Treating Provider/Extender: [name] in Treatment: 1 Information Obtained from: Patient Chief Complaint 03/21/2022; left lower extremity wound Electronic Signature(s) Signed: 03/28/2022 2:44:54 PM By: Kalman Shan DO Entered By: Kalman Shan on 03/28/2022 14:03:20 -------------------------------------------------------------------------------- Debridement Details Patient Name: Date of Service: [name]. 03/28/2022 1:30 PM Medical Record Number: [id number] Patient Account Number: [id number] Date of Birth/Sex: Treating RN: 1950-09-19 (72 y.o. [name], Meta.Reding Primary Care Provider: Shon Baton Other Clinician: Referring Provider: Treating Provider/Extender: [name] in Treatment: 1 Debridement Performed for Assessment: Wound #1 Left,Anterior Lower Leg Performed By: Physician Kalman Shan, DO Debridement Type: Debridement Level of Consciousness (Pre-procedure): Awake and Alert Pre-procedure Verification/Time Out Yes - 13:45 Taken: Start Time: 13:46 Pain Control: Lidocaine 4% T opical Solution T Area Debrided (L x W): otal 4 (cm) x 2.2 (cm) = 8.8 (cm) Tissue and other material debrided: Viable, Non-Viable, Slough, Subcutaneous, Skin: Dermis , Skin: Epidermis, Slough Level: Skin/Subcutaneous Tissue Debridement Description: Excisional Instrument: Blade, Forceps, Scissors Bleeding: Minimum End Time: 14:00 Procedural Pain: 0 Post Procedural Pain: 0 Response to Treatment: Procedure was tolerated well Level of  Consciousness (Post- Awake and Alert procedure): Post Debridement Measurements of Total Wound Length: (cm) 4 Width: (cm) 2.2 Depth: (cm) 0.1 Volume: (cm) 0.691 Character of Wound/Ulcer Post Debridement: Improved Post Procedure Diagnosis Same as Pre-procedure Notes all sutures removed by provider. Electronic Signature(s) Signed: 03/28/2022 2:41:34 PM By: Deon Pilling RN, BSN Signed: 03/28/2022 2:44:54 PM By: Kalman Shan DO Entered By: Deon Pilling on 03/28/2022 14:01:06 Adele Schilder ([id number]) [id number] Page 2 of 8 -------------------------------------------------------------------------------- HPI Details Patient Name: Date of Service: [name], [name]. 03/28/2022 1:30 PM Medical Record Number: [id number] Patient Account Number: [id number] Date of Birth/Sex: Treating RN: [birth year] (72 y.o. F) Primary Care Provider: Shon Baton Other Clinician: Referring Provider: Treating Provider/Extender: [name] in Treatment: 1 History of Present Illness HPI Description: 03/21/2022 [name] is a 71 year old female with a past medical history of postpolio syndrome, endometrial cancer and essential hypertension that presents the clinic for a 1 month history of nonhealing ulcer to the left lower extremity. She states that she had a melanoma removed from the left lower extremity on 2/27 by Dr. Sarajane Jews. After the surgery the wound dehisced. They have been using compression wraps. It is unclear what dressing has been used. She has no issues or complaints today. She denies signs of infection. 3/25; Patient presents for follow-up. We have been using Santyl and Hydrofera Blue under Kerlix/Coban. There is been improvement in wound healing. She had no issues with the compression wrap. Electronic Signature(s) Signed: 03/28/2022 2:44:54 PM By: Kalman Shan DO Entered By: Kalman Shan on 03/28/2022  14:05:51 -------------------------------------------------------------------------------- Physical Exam Details Patient Name: Date of Service: [name]. 03/28/2022 1:30 PM Medical Record Number: [id number] Patient Account Number: [id number] Date of Birth/Sex: Treating RN: 18-Sep-1950 (72 y.o. F) Primary Care Provider: Shon Baton Other Clinician: Referring Provider: Treating Provider/Extender: [name] in Treatment: 1 Constitutional respirations regular, non-labored and within target range for patient.. Cardiovascular 2+ dorsalis pedis/posterior tibialis pulses. Psychiatric pleasant and cooperative. Notes Left lower extremity:  Open wound to the anterior aspect with granulation tissue and nonviable tissue. Several sutures in place and these were removed. Decent edema control. No signs of surrounding infection. Electronic Signature(s) Signed: 03/28/2022 2:44:54 PM By: Kalman Shan DO Entered By: Kalman Shan on 03/28/2022 14:06:49 -------------------------------------------------------------------------------- Physician Orders Details Patient Name: Date of Service: [name]. 03/28/2022 1:30 PM Medical Record Number: [id number] Patient Account Number: [id number] Date of Birth/Sex: Treating RN: [birth year] (72 y.o. [name], Tammi Klippel Primary Care Provider: Shon Baton Other Clinician: Referring Provider: Treating Provider/Extender: [name] in Treatment: 1 Verbal / Phone Orders: No Diagnosis Coding ICD-10 Coding LORIAH, DELIRA ([id number]) [id number] Page 3 of 8 Code Description [phone] Non-pressure chronic ulcer of other part of left lower leg with fat layer exposed T81.31XA Disruption of external operation (surgical) wound, not elsewhere classified, initial encounter C43.9 Malignant melanoma of skin, unspecified G14 Postpolio syndrome C54.1 Malignant neoplasm of endometrium I89.0  Lymphedema, not elsewhere classified Follow-up Appointments ppointment in 2 weeks. - Dr. Heber [place] 130 04/11/2022 room 8 Return A Nurse Visit: - Monday ****change [place] to nurse visit at 130.**** Anesthetic (In clinic) Topical Lidocaine 4% applied to wound bed - used in clinic Bathing/ Shower/ Hygiene Other Bathing/Shower/Hygiene Orders/Instructions: - Please keep the left leg wraps dry (for a week) Edema Control - Lymphedema / SCD / Other Avoid standing for long periods of time. Exercise regularly - Walk/exercise a s tolerated throughout the day Moisturize legs daily. Off-Loading Other: - Elevate left leg as tolerated throughout the day. Wound Treatment Wound #1 - Lower Leg Wound Laterality: Left, Anterior Cleanser: Soap and Water 1 x Per Week/30 Days Discharge Instructions: May shower and wash wound with dial antibacterial soap and water prior to dressing change. Cleanser: Vashe 5.8 (oz) 1 x Per Week/30 Days Discharge Instructions: Cleanse the wound with Vashe prior to applying a clean dressing using gauze sponges, not tissue or cotton balls. Peri-Wound Care: Sween Lotion (Moisturizing lotion) 1 x Per Week/30 Days Discharge Instructions: Apply moisturizing lotion as directed Prim Dressing: Hydrofera Blue Ready Transfer Foam, 4x5 (in/in) 1 x Per Week/30 Days ary Discharge Instructions: Apply to wound bed as instructed Prim Dressing: Santyl Ointment 1 x Per Week/30 Days ary Discharge Instructions: Apply nickel thick amount to wound bed as instructed Secondary Dressing: Woven Gauze Sponge, Non-Sterile 4x4 in 1 x Per Week/30 Days Discharge Instructions: Apply over primary dressing as directed. Secured With: Coban Self-Adherent Wrap 4x5 (in/yd) 1 x Per Week/30 Days Discharge Instructions: Secure with Coban as directed. Secured With: [employer], 4.5x3.1 (in/yd) 1 x Per Week/30 Days Discharge Instructions: Secure with Kerlix as directed. Patient Medications llergies: mold, house  dust, latex A Notifications Medication Indication Start End for debridements in 03/28/2022 lidocaine clinic. DOSE topical 4 % cream - cream topical once daily Electronic Signature(s) Signed: 03/28/2022 2:44:54 PM By: Kalman Shan DO Entered By: Kalman Shan on 03/28/2022 14:06:58 -------------------------------------------------------------------------------- Problem List Details Patient Name: Date of Service: [name]. 03/28/2022 1:30 PM Medical Record Number: [id number] Patient Account Number: [id number] EMONY, SHAO ([id number]) [id number] Page 4 of 8 Date of Birth/Sex: Treating RN: [birth year] (72 y.o. [name] Primary Care Provider: Other Clinician: Shon Baton Referring Provider: Treating Provider/Extender: [name] in Treatment: 1 Active Problems ICD-10 Encounter Code Description Active Date MDM Diagnosis [phone] Non-pressure chronic ulcer of other part of left lower leg with fat layer exposed3/18/2024 No Yes T81.31XA Disruption of external operation (surgical) wound, not elsewhere classified, 03/21/2022  No Yes initial encounter C43.9 Malignant melanoma of skin, unspecified 03/21/2022 No Yes G14 Postpolio syndrome 03/21/2022 No Yes C54.1 Malignant neoplasm of endometrium 03/21/2022 No Yes I89.0 Lymphedema, not elsewhere classified 03/21/2022 No Yes Inactive Problems Resolved Problems Electronic Signature(s) Signed: 03/28/2022 2:44:54 PM By: Kalman Shan DO Entered By: Kalman Shan on 03/28/2022 14:01:36 -------------------------------------------------------------------------------- Progress Note Details Patient Name: Date of Service: [name]. 03/28/2022 1:30 PM Medical Record Number: [id number] Patient Account Number: [id number] Date of Birth/Sex: Treating RN: 1950/11/20 (72 y.o. F) Primary Care Provider: Shon Baton Other Clinician: Referring Provider: Treating  Provider/Extender: [name] in Treatment: 1 Subjective Chief Complaint Information obtained from Patient 03/21/2022; left lower extremity wound History of Present Illness (HPI) 03/21/2022 Ms. Kazia Morini is a 72 year old female with a past medical history of postpolio syndrome, endometrial cancer and essential hypertension that presents the clinic for a 1 month history of nonhealing ulcer to the left lower extremity. She states that she had a melanoma removed from the left lower extremity on 2/27 by Dr. Sarajane Jews. After the surgery the wound dehisced. They have been using compression wraps. It is unclear what dressing has been used. She has no issues or complaints today. She denies signs of infection. 3/25; Patient presents for follow-up. We have been using Santyl and Hydrofera Blue under Kerlix/Coban. There is been improvement in wound healing. She had no issues with the compression wrap. Patient History Information obtained from Patient. LINNETTE, HOLMEN ([id number]) [id number] Page 5 of 8 Family History Cancer - Mother,Father,Siblings,Child,Paternal Grandparents,Maternal Grandparents, Heart Disease - Mother. Social History Never smoker, Marital Status - Married, Alcohol Use - Rarely, Drug Use - No History, Caffeine Use - Never. Medical History Respiratory Patient has history of Asthma Cardiovascular Patient has history of Hypertension - Unspecified essential HTN Oncologic Patient has history of Received Chemotherapy, Received Radiation - 05/06/19-06/04/19 Vaginal Brachytherapy (endometrialcancer) Hospitalization/Surgery History - Robotic assisted total hysterectomy; Right T hip. otal Medical A Surgical History Notes nd Cardiovascular hyperlipidemia Musculoskeletal Hx: Polio -Left leg wears a brace Neurologic [id number] Objective Constitutional respirations regular, non-labored and within target range for patient.. Vitals Time  Taken: 1:40 PM, Height: 62 in, Weight: 148 lbs, BMI: 27.1, Temperature: 97.6 F, Pulse: 94 bpm, Respiratory Rate: 20 breaths/min, Blood Pressure: 135/82 mmHg. Cardiovascular 2+ dorsalis pedis/posterior tibialis pulses. Psychiatric pleasant and cooperative. General Notes: Left lower extremity: Open wound to the anterior aspect with granulation tissue and nonviable tissue. Several sutures in place and these were removed. Decent edema control. No signs of surrounding infection. Integumentary (Hair, Skin) Wound #1 status is Open. Original cause of wound was Surgical Injury. The date acquired was: 03/01/2022. The wound has been in treatment 1 weeks. The wound is located on the Left,Anterior Lower Leg. The wound measures 4cm length x 2.2cm width x 0.1cm depth; 6.912cm^2 area and 0.691cm^3 volume. There is Fat Layer (Subcutaneous Tissue) exposed. There is no tunneling or undermining noted. There is a medium amount of serosanguineous drainage noted. The wound margin is distinct with the outline attached to the wound base. There is medium (34-66%) red, hyper - granulation within the wound bed. There is a medium (34-66%) amount of necrotic tissue within the wound bed including Adherent Slough. The periwound skin appearance had no abnormalities noted for moisture. The periwound skin appearance exhibited: Scarring. The periwound skin appearance did not exhibit: Callus, Crepitus, Excoriation, Induration, Rash, Atrophie Blanche, Cyanosis, Ecchymosis, Hemosiderin Staining, Mottled, Pallor, Rubor, Erythema. Periwound temperature was noted as No Abnormality. General Notes: x3  sutures Assessment Active Problems ICD-10 Non-pressure chronic ulcer of other part of left lower leg with fat layer exposed Disruption of external operation (surgical) wound, not elsewhere classified, initial encounter Malignant melanoma of skin, unspecified Postpolio syndrome Malignant neoplasm of endometrium Lymphedema, not elsewhere  classified Patient's wound has shown improvement in size and appearance since last clinic visit. I was able to remove 3 more sutures. I debrided nonviable tissue. I recommended continuing the course with Santyl and Hydrofera Blue under Kerlix/Coban. Procedures CHANTAY, ELLWOOD ([id number]) [id number] Page 6 of 8 Wound #1 Pre-procedure diagnosis of Wound #1 is a Dehisced Wound located on the Left,Anterior Lower Leg . There was a Excisional Skin/Subcutaneous Tissue Debridement with a total area of 8.8 sq cm performed by Kalman Shan, DO. With the following instrument(s): Blade, Forceps, and Scissors to remove Viable and Non-Viable tissue/material. Material removed includes Subcutaneous Tissue, Slough, Skin: Dermis, and Skin: Epidermis after achieving pain control using Lidocaine 4% Topical Solution. A time out was conducted at 13:45, prior to the start of the procedure. A Minimum amount of bleeding was controlled with N/A. The procedure was tolerated well with a pain level of 0 throughout and a pain level of 0 following the procedure. Post Debridement Measurements: 4cm length x 2.2cm width x 0.1cm depth; 0.691cm^3 volume. Character of Wound/Ulcer Post Debridement is improved. Post procedure Diagnosis Wound #1: Same as Pre-Procedure General Notes: all sutures removed by provider.. Plan Follow-up Appointments: Return Appointment in 2 weeks. - Dr. Heber [place] 130 04/11/2022 room 8 Nurse Visit: - Monday ****change [place] to nurse visit at 130.**** Anesthetic: (In clinic) Topical Lidocaine 4% applied to wound bed - used in clinic Bathing/ Shower/ Hygiene: Other Bathing/Shower/Hygiene Orders/Instructions: - Please keep the left leg wraps dry (for a week) Edema Control - Lymphedema / SCD / Other: Avoid standing for long periods of time. Exercise regularly - Walk/exercise a s tolerated throughout the day Moisturize legs daily. Off-Loading: Other: - Elevate left leg as tolerated  throughout the day. The following medication(s) was prescribed: lidocaine topical 4 % cream cream topical once daily for for debridements in clinic. was prescribed at facility WOUND #1: - Lower Leg Wound Laterality: Left, Anterior Cleanser: Soap and Water 1 x Per Week/30 Days Discharge Instructions: May shower and wash wound with dial antibacterial soap and water prior to dressing change. Cleanser: Vashe 5.8 (oz) 1 x Per Week/30 Days Discharge Instructions: Cleanse the wound with Vashe prior to applying a clean dressing using gauze sponges, not tissue or cotton balls. Peri-Wound Care: Sween Lotion (Moisturizing lotion) 1 x Per Week/30 Days Discharge Instructions: Apply moisturizing lotion as directed Prim Dressing: Hydrofera Blue Ready Transfer Foam, 4x5 (in/in) 1 x Per Week/30 Days ary Discharge Instructions: Apply to wound bed as instructed Prim Dressing: Santyl Ointment 1 x Per Week/30 Days ary Discharge Instructions: Apply nickel thick amount to wound bed as instructed Secondary Dressing: Woven Gauze Sponge, Non-Sterile 4x4 in 1 x Per Week/30 Days Discharge Instructions: Apply over primary dressing as directed. Secured With: Coban Self-Adherent Wrap 4x5 (in/yd) 1 x Per Week/30 Days Discharge Instructions: Secure with Coban as directed. Secured With: [employer], 4.5x3.1 (in/yd) 1 x Per Week/30 Days Discharge Instructions: Secure with Kerlix as directed. 1. In office sharp debridement 2. Santyl and Hydrofera Blue under Kerlix/Cobanooleft lower extremity 3. Follow-up in 1 week for nurse visit and following week for physician visit Electronic Signature(s) Signed: 03/28/2022 2:44:54 PM By: Kalman Shan DO Entered By: Kalman Shan on 03/28/2022 14:08:16 -------------------------------------------------------------------------------- HxROS Details Patient Name:  Date of Service: ELLIAUNA, DRAUDT. 03/28/2022 1:30 PM Medical Record Number: [id number] Patient Account Number:  [id number] Date of Birth/Sex: Treating RN: 1950-11-25 (72 y.o. F) Primary Care Provider: Shon Baton Other Clinician: Referring Provider: Treating Provider/Extender: [name] in Treatment: 1 Information Obtained From Patient Respiratory Medical History: Positive for: Asthma Cardiovascular HAILYNN, MUSSELWHITE ([id number]) [id number] Page 7 of 8 Medical History: Positive for: Hypertension - Unspecified essential HTN Past Medical History Notes: hyperlipidemia Musculoskeletal Medical History: Past Medical History Notes: Hx: Polio -Left leg wears a brace Neurologic Medical History: Past Medical History Notes: [id number] Oncologic Medical History: Positive for: Received Chemotherapy; Received Radiation - 05/06/19-06/04/19 Vaginal Brachytherapy (endometrialcancer) Immunizations Pneumococcal Vaccine: Received Pneumococcal Vaccination: Yes Received Pneumococcal Vaccination On or After 60th Birthday: Yes Implantable Devices Yes Hospitalization / Surgery History Type of Hospitalization/Surgery Robotic assisted total hysterectomy; Right T hip otal Family and Social History Cancer: Yes - Mother,Father,Siblings,Child,Paternal Grandparents,Maternal Grandparents; Heart Disease: Yes - Mother; Never smoker; Marital Status - Married; Alcohol Use: Rarely; Drug Use: No History; Caffeine Use: Never; Financial Concerns: No; Food, Clothing or Shelter Needs: No; Support System Lacking: No; Transportation Concerns: No Electronic Signature(s) Signed: 03/28/2022 2:44:54 PM By: Kalman Shan DO Entered By: Kalman Shan on 03/28/2022 14:05:58 -------------------------------------------------------------------------------- SuperBill Details Patient Name: Date of Service: [name]. 03/28/2022 Medical Record Number: [id number] Patient Account Number: [id number] Date of Birth/Sex: Treating RN: September 07, 1950 (72 y.o. [name] Primary Care  Provider: Shon Baton Other Clinician: Referring Provider: Treating Provider/Extender: [name] in Treatment: 1 Diagnosis Coding ICD-10 Codes Code Description [phone] Non-pressure chronic ulcer of other part of left lower leg with fat layer exposed T81.31XA Disruption of external operation (surgical) wound, not elsewhere classified, initial encounter C43.9 Malignant melanoma of skin, unspecified G14 Postpolio syndrome C54.1 Malignant neoplasm of endometrium I89.0 Lymphedema, not elsewhere classified Facility Procedures : ANIQA, MAURY Code: [id number] LENOR SATHRE ([id number]) Description: [phone] - DEB SUBQ TISSUE 20 SQ CM/< ICD-10 Diagnosis Description L97.822 Non-pressure chronic ulcer of other part of left lower leg with fat layer expo [phone] Modifier: sed [id number] Quantity: 1 27.pdf Page 8 of 8 Physician Procedures : CPT4 Code Description Modifier [id number] - WC PHYS SUBQ TISS 20 SQ CM ICD-10 Diagnosis Description L97.822 Non-pressure chronic ulcer of other part of left lower leg with fat layer exposed Quantity: 1 Electronic Signature(s) Signed: 03/28/2022 2:44:54 PM By: Kalman Shan DO Entered By: Kalman Shan on 03/28/2022 14:08:27

## 2022-03-28 NOTE — Progress Notes (Signed)
[name], [name] ([id number]) [id number] Page 1 of 7 Visit Report for 03/28/2022 Arrival Information Details Patient Name: Date of Service: [name], [name]. 03/28/2022 1:30 PM Medical Record Number: [id number] Patient Account Number: [id number] Date of Birth/Sex: Treating RN: [birth year] (72 y.o. [name], [name] Primary Care Railee Bonillas: Shon Baton Other Clinician: Referring Cecil Vandyke: Treating Havard Radigan/Extender: [name] in Treatment: 1 Visit Information History Since Last Visit Added or deleted any medications: No Patient Arrived: Wheel Chair Any new allergies or adverse reactions: No Arrival Time: 13:41 Had a fall or experienced change in No Accompanied By: self activities of daily living that may affect Transfer Assistance: Manual risk of falls: Patient Identification Verified: Yes Signs or symptoms of abuse/neglect since No Secondary Verification Process Completed: Yes last visito Patient Requires Transmission-Based Precautions: No Hospitalized since last visit: No Patient Has Alerts: No Implantable device outside of the clinic No excluding cellular tissue based products placed in the center since last visit: Has Dressing in Place as Prescribed: Yes Has Footwear/Offloading in Place as Yes Prescribed: Left: Surgical [name] with Pressure Relief Insole Pain Present Now: No Electronic Signature(s) Signed: 03/28/2022 2:41:34 PM By: Deon Pilling RN, BSN Entered By: Deon Pilling on 03/28/2022 13:41:53 -------------------------------------------------------------------------------- Encounter Discharge Information Details Patient Name: Date of Service: [name]. 03/28/2022 1:30 PM Medical Record Number: [id number] Patient Account Number: [id number] Date of Birth/Sex: Treating RN: 1950/09/16 (72 y.o. [name], [name] Primary Care Jereme Loren: Shon Baton Other Clinician: Referring Remie Mathison: Treating Melia Hopes/Extender: [name] in Treatment: 1 Encounter Discharge Information Items Post Procedure Vitals Discharge Condition: Stable Temperature (F): 97.6 Ambulatory Status: Wheelchair Pulse (bpm): 94 Discharge Destination: Home Respiratory Rate (breaths/min): 20 Transportation: Private Auto Blood Pressure (mmHg): 135/82 Accompanied By: self Schedule Follow-up Appointment: Yes Clinical Summary of Care: Electronic Signature(s) Signed: 03/28/2022 2:41:34 PM By: Deon Pilling RN, BSN Entered By: Deon Pilling on 03/28/2022 14:01:50 -------------------------------------------------------------------------------- Lower Extremity Assessment Details Patient Name: Date of Service: Desiree Berdine Dance. 03/28/2022 1:30 PM Medical Record Number: [id number] Patient Account Number: [id number] Date of Birth/Sex: Treating RN: 24-Nov-1950 (72 y.o. [name] Primary Care Klarisa Barman: Shon Baton Other Clinician: Referring Devyne Hauger: Treating Jewelene Mairena/Extender: Emarie, Zieger Ko Vaya ([id number]) [id number] Page 2 of 7 Weeks in Treatment: 1 Edema Assessment Assessed: [Left: Yes] [Right: No] Edema: [Left: Ye] [Right: s] Calf Left: Right: Point of Measurement: 27 cm From Medial Instep 30.5 cm Ankle Left: Right: Point of Measurement: 9 cm From Medial Instep 25.5 cm Vascular Assessment Pulses: Dorsalis Pedis Palpable: [Left:Yes] Electronic Signature(s) Signed: 03/28/2022 2:41:34 PM By: Deon Pilling RN, BSN Entered By: Deon Pilling on 03/28/2022 13:45:51 -------------------------------------------------------------------------------- Multi Wound Chart Details Patient Name: Date of Service: [name]. 03/28/2022 1:30 PM Medical Record Number: [id number] Patient Account Number: [id number] Date of Birth/Sex: Treating RN: 06-18-1950 (72 y.o. F) Primary Care Tivis Wherry: Shon Baton Other Clinician: Referring Merrianne Mccumbers: Treating Tyliyah Mcmeekin/Extender:  [name] in Treatment: 1 Vital Signs Height(in): 62 Pulse(bpm): 94 Weight(lbs): 148 Blood Pressure(mmHg): 135/82 Body Mass Index(BMI): 27.1 Temperature(F): 97.6 Respiratory Rate(breaths/min): 20 [1:Photos:] [N/A:N/A] Left, Anterior Lower Leg N/A N/A Wound Location: Surgical Injury N/A N/A Wounding Event: Dehisced Wound N/A N/A Primary Etiology: Asthma, Hypertension, Received N/A N/A Comorbid History: Chemotherapy, Received Radiation 03/01/2022 N/A N/A Date Acquired: 1 N/A N/A Weeks of Treatment: Open N/A N/A Wound Status: No N/A N/A Wound Recurrence: 4x2.2x0.1 N/A N/A Measurements L x W x D (cm) 6.912 N/A N/A A (  cm) : rea 0.691 N/A N/A Volume (cm) : 57.10% N/A N/A % Reduction in Area: 57.10% N/A N/A % Reduction in Volume: Full Thickness Without Exposed N/A N/A Classification: Support Structures Medium N/A N/A Exudate Amount: Serosanguineous N/A N/A Exudate Type: red, brown N/A N/A Exudate Color: SAKAE, EULL ([id number]) [id number] Page 3 of 7 Distinct, outline attached N/A N/A Wound Margin: Medium (34-66%) N/A N/A Granulation Amount: Red, Hyper-granulation N/A N/A Granulation Quality: Medium (34-66%) N/A N/A Necrotic Amount: Fat Layer (Subcutaneous Tissue): Yes N/A N/A Exposed Structures: Fascia: No Tendon: No Muscle: No Joint: No Bone: No Small (1-33%) N/A N/A Epithelialization: Debridement - Excisional N/A N/A Debridement: Pre-procedure Verification/Time Out 13:45 N/A N/A Taken: Lidocaine 4% Topical Solution N/A N/A Pain Control: Subcutaneous, Slough N/A N/A Tissue Debrided: Skin/Subcutaneous Tissue N/A N/A Level: 8.8 N/A N/A Debridement A (sq cm): rea Blade, Forceps, Scissors N/A N/A Instrument: Minimum N/A N/A Bleeding: 0 N/A N/A Procedural Pain: 0 N/A N/A Post Procedural Pain: Procedure was tolerated well N/A N/A Debridement Treatment Response: 4x2.2x0.1 N/A  N/A Post Debridement Measurements L x W x D (cm) 0.691 N/A N/A Post Debridement Volume: (cm) Scarring: Yes N/A N/A Periwound Skin Texture: Excoriation: No Induration: No Callus: No Crepitus: No Rash: No Maceration: No N/A N/A Periwound Skin Moisture: Dry/Scaly: No Atrophie Blanche: No N/A N/A Periwound Skin Color: Cyanosis: No Ecchymosis: No Erythema: No Hemosiderin Staining: No Mottled: No Pallor: No Rubor: No No Abnormality N/A N/A Temperature: x3 sutures N/A N/A Assessment Notes: Debridement N/A N/A Procedures Performed: Treatment Notes Wound #1 (Lower Leg) Wound Laterality: Left, Anterior Cleanser Soap and Water Discharge Instruction: May shower and wash wound with dial antibacterial soap and water prior to dressing change. Vashe 5.8 (oz) Discharge Instruction: Cleanse the wound with Vashe prior to applying a clean dressing using gauze sponges, not tissue or cotton balls. Peri-Wound Care Sween Lotion (Moisturizing lotion) Discharge Instruction: Apply moisturizing lotion as directed Topical Primary Dressing Hydrofera Blue Ready Transfer Foam, 4x5 (in/in) Discharge Instruction: Apply to wound bed as instructed Santyl Ointment Discharge Instruction: Apply nickel thick amount to wound bed as instructed Secondary Dressing Woven Gauze Sponge, Non-Sterile 4x4 in Discharge Instruction: Apply over primary dressing as directed. Secured With [employer] 4x5 (in/yd) Discharge Instruction: Secure with Coban as directed. Kerlix Roll Sterile, 4.5x3.1 (in/yd) Discharge Instruction: Secure with Kerlix as directed. Compression [address] CORREN, GAINEY ([id number]) [id number] Page 4 of 7 Compression Stockings Add-Ons Electronic Signature(s) Signed: 03/28/2022 2:44:54 PM By: Kalman Shan DO Entered By: Kalman Shan on 03/28/2022  14:01:41 -------------------------------------------------------------------------------- Multi-Disciplinary Care Plan Details Patient Name: Date of Service: [name]. 03/28/2022 1:30 PM Medical Record Number: [id number] Patient Account Number: [id number] Date of Birth/Sex: Treating RN: 12-12-1950 (72 y.o. [name] Primary Care Shahil Speegle: Shon Baton Other Clinician: Referring Niurka Benecke: Treating Lamount Bankson/Extender: [name] in Treatment: 1 Active Inactive Wound/Skin Impairment Nursing Diagnoses: Impaired tissue integrity Goals: Patient/caregiver will verbalize understanding of skin care regimen Date Initiated: 03/21/2022 Target Resolution Date: 07/03/2022 Goal Status: Active Interventions: Assess ulceration(s) every visit Treatment Activities: Skin care regimen initiated : 03/21/2022 Notes: Electronic Signature(s) Signed: 03/28/2022 2:41:34 PM By: Deon Pilling RN, BSN Entered By: Deon Pilling on 03/28/2022 13:52:12 -------------------------------------------------------------------------------- Pain Assessment Details Patient Name: Date of Service: [name]. 03/28/2022 1:30 PM Medical Record Number: [id number] Patient Account Number: [id number] Date of Birth/Sex: Treating RN: [birth year] (72 y.o. [name], [name] Primary Care Laurin Paulo: Shon Baton Other Clinician: Referring Caleigha Zale: Treating Gurnoor Sloop/Extender: Pamelia Hoit,  John Weeks in Treatment: 1 Active Problems Location of Pain Severity and Description of Pain Patient Has Paino No Site Locations Rate the pain. SANDARA, NEUKAM ([id number]) [id number] Page 5 of 7 Rate the pain. Current Pain Level: 0 Pain Management and Medication Current Pain Management: Medication: No Cold Application: No Rest: No Massage: No Activity: No T.E.N.S.: No Heat Application: No Leg drop or elevation: No Is the Current Pain Management Adequate:  Adequate How does your wound impact your activities of daily livingo Sleep: No Bathing: No Appetite: No Relationship With Others: No Bladder Continence: No Emotions: No Bowel Continence: No Work: No Toileting: No Drive: No Dressing: No Hobbies: No Engineer, maintenance) Signed: 03/28/2022 2:41:34 PM By: Deon Pilling RN, BSN Entered By: Deon Pilling on 03/28/2022 13:42:03 -------------------------------------------------------------------------------- Patient/Caregiver Education Details Patient Name: Date of Service: [name] 3/25/2024andnbsp1:30 PM Medical Record Number: [id number] Patient Account Number: [id number] Date of Birth/Gender: Treating RN: [birth year] (72 y.o. [name] Primary Care Physician: Shon Baton Other Clinician: Referring Physician: Treating Physician/Extender: [name] in Treatment: 1 Education Assessment Education Provided To: Patient Education Topics Provided Wound/Skin Impairment: Handouts: Caring for Your Ulcer Methods: Explain/Verbal Responses: Reinforcements needed Electronic Signature(s) Signed: 03/28/2022 2:41:34 PM By: Deon Pilling RN, BSN Entered By: Deon Pilling on 03/28/2022 13:52:24 -------------------------------------------------------------------------------- Wound Assessment Details Patient Name: Date of Service: Desiree Desiree Graff M. 03/28/2022 1:30 PM Adele Schilder ([id number].pdf Page 6 of 7 Medical Record Number: [id number] Patient Account Number: [id number] Date of Birth/Sex: Treating RN: 1950-12-07 (72 y.o. [name], Meta.Reding Primary Care Carston Riedl: Shon Baton Other Clinician: Referring Reeanna Acri: Treating Aarika Moon/Extender: [name] in Treatment: 1 Wound Status Wound Number: 1 Primary Dehisced Wound Etiology: Wound Location: Left, Anterior Lower Leg Wound Status: Open Wounding Event: Surgical Injury Notes: pt. had a  Melanoma removed by Dr. Sarajane Jews Date Acquired: 03/01/2022 Comorbid Asthma, Hypertension, Received Chemotherapy, Received Weeks Of Treatment: 1 History: Radiation Clustered Wound: No Photos Wound Measurements Length: (cm) 4 Width: (cm) 2.2 Depth: (cm) 0.1 Area: (cm) 6.912 Volume: (cm) 0.691 % Reduction in Area: 57.1% % Reduction in Volume: 57.1% Epithelialization: Small (1-33%) Tunneling: No Undermining: No Wound Description Classification: Full Thickness Without Exposed Suppor Wound Margin: Distinct, outline attached Exudate Amount: Medium Exudate Type: Serosanguineous Exudate Color: red, brown t Structures Foul Odor After Cleansing: No Slough/Fibrino Yes Wound Bed Granulation Amount: Medium (34-66%) Exposed Structure Granulation Quality: Red, Hyper-granulation Fascia Exposed: No Necrotic Amount: Medium (34-66%) Fat Layer (Subcutaneous Tissue) Exposed: Yes Necrotic Quality: Adherent Slough Tendon Exposed: No Muscle Exposed: No Joint Exposed: No Bone Exposed: No Periwound Skin Texture Texture Color No Abnormalities Noted: No No Abnormalities Noted: No Callus: No Atrophie Blanche: No Crepitus: No Cyanosis: No Excoriation: No Ecchymosis: No Induration: No Erythema: No Rash: No Hemosiderin Staining: No Scarring: Yes Mottled: No Pallor: No Moisture Rubor: No No Abnormalities Noted: Yes Temperature / Pain Temperature: No Abnormality Assessment Notes x3 sutures Treatment Notes Wound #1 (Lower Leg) Wound Laterality: Left, Anterior Cleanser AMELA, BISSEN ([id number]) [id number] Page 7 of 7 Soap and Water Discharge Instruction: May shower and wash wound with dial antibacterial soap and water prior to dressing change. Vashe 5.8 (oz) Discharge Instruction: Cleanse the wound with Vashe prior to applying a clean dressing using gauze sponges, not tissue or cotton balls. Peri-Wound Care Sween Lotion (Moisturizing lotion) Discharge  Instruction: Apply moisturizing lotion as directed Topical Primary Dressing Hydrofera Blue Ready Transfer Foam, 4x5 (in/in) Discharge Instruction: Apply to wound bed as  instructed Santyl Ointment Discharge Instruction: Apply nickel thick amount to wound bed as instructed Secondary Dressing Woven Gauze Sponge, Non-Sterile 4x4 in Discharge Instruction: Apply over primary dressing as directed. Secured With [employer] 4x5 (in/yd) Discharge Instruction: Secure with Coban as directed. Kerlix Roll Sterile, 4.5x3.1 (in/yd) Discharge Instruction: Secure with Kerlix as directed. Compression Wrap Compression Stockings Add-Ons Electronic Signature(s) Signed: 03/28/2022 2:41:34 PM By: Deon Pilling RN, BSN Entered By: Deon Pilling on 03/28/2022 13:50:32 -------------------------------------------------------------------------------- Vitals Details Patient Name: Date of Service: [name]. 03/28/2022 1:30 PM Medical Record Number: [id number] Patient Account Number: [id number] Date of Birth/Sex: Treating RN: [birth year] (72 y.o. [name], [name] Primary Care Justus Duerr: Shon Baton Other Clinician: Referring Aeisha Minarik: Treating Paulette Rockford/Extender: [name] in Treatment: 1 Vital Signs Time Taken: 13:40 Temperature (F): 97.6 Height (in): 62 Pulse (bpm): 94 Weight (lbs): 148 Respiratory Rate (breaths/min): 20 Body Mass Index (BMI): 27.1 Blood Pressure (mmHg): 135/82 Reference Range: 80 - 120 mg / dl Electronic Signature(s) Signed: 03/28/2022 2:41:34 PM By: Deon Pilling RN, BSN Entered By: Deon Pilling on 03/28/2022 13:42:57

## 2022-04-04 ENCOUNTER — Encounter (HOSPITAL_BASED_OUTPATIENT_CLINIC_OR_DEPARTMENT_OTHER): Payer: Medicare Other | Attending: Internal Medicine | Admitting: General Surgery

## 2022-04-04 DIAGNOSIS — J3089 Other allergic rhinitis: Secondary | ICD-10-CM | POA: Diagnosis not present

## 2022-04-04 DIAGNOSIS — Z923 Personal history of irradiation: Secondary | ICD-10-CM | POA: Diagnosis not present

## 2022-04-04 DIAGNOSIS — I890 Lymphedema, not elsewhere classified: Secondary | ICD-10-CM | POA: Diagnosis not present

## 2022-04-04 DIAGNOSIS — Z8542 Personal history of malignant neoplasm of other parts of uterus: Secondary | ICD-10-CM | POA: Insufficient documentation

## 2022-04-04 DIAGNOSIS — T8131XA Disruption of external operation (surgical) wound, not elsewhere classified, initial encounter: Secondary | ICD-10-CM | POA: Insufficient documentation

## 2022-04-04 DIAGNOSIS — I872 Venous insufficiency (chronic) (peripheral): Secondary | ICD-10-CM | POA: Insufficient documentation

## 2022-04-04 DIAGNOSIS — Z85820 Personal history of malignant melanoma of skin: Secondary | ICD-10-CM | POA: Diagnosis not present

## 2022-04-04 DIAGNOSIS — L97822 Non-pressure chronic ulcer of other part of left lower leg with fat layer exposed: Secondary | ICD-10-CM | POA: Diagnosis not present

## 2022-04-04 DIAGNOSIS — G14 Postpolio syndrome: Secondary | ICD-10-CM | POA: Insufficient documentation

## 2022-04-04 DIAGNOSIS — J301 Allergic rhinitis due to pollen: Secondary | ICD-10-CM | POA: Diagnosis not present

## 2022-04-04 DIAGNOSIS — Y839 Surgical procedure, unspecified as the cause of abnormal reaction of the patient, or of later complication, without mention of misadventure at the time of the procedure: Secondary | ICD-10-CM | POA: Diagnosis not present

## 2022-04-04 DIAGNOSIS — J3081 Allergic rhinitis due to animal (cat) (dog) hair and dander: Secondary | ICD-10-CM | POA: Diagnosis not present

## 2022-04-05 NOTE — Progress Notes (Signed)
KATHLYNE, JAKUS ([id number]) [id number] Page 1 of 2 Visit Report for 04/04/2022 Arrival Information Details Patient Name: Date of Service: GO [name], [name]. 04/04/2022 1:30 PM Medical Record Number: [id number] Patient Account Number: [id number] Date of Birth/Sex: Treating RN: 1950/08/25 (72 y.o. [name], Tammi Klippel Primary Care Fatumata Kashani: Shon Baton Other Clinician: Referring Evalee Gerard: Treating Shawneequa Baldridge/Extender: Golda Acre in Treatment: 2 Visit Information History Since Last Visit Added or deleted any medications: No Patient Arrived: Wheel Chair Any new allergies or adverse reactions: No Arrival Time: 13:20 Had a fall or experienced change in No Accompanied By: self activities of daily living that may affect Transfer Assistance: Manual risk of falls: Patient Identification Verified: Yes Signs or symptoms of abuse/neglect since last visito No Secondary Verification Process Completed: Yes Hospitalized since last visit: No Patient Requires Transmission-Based Precautions: No Implantable device outside of the clinic excluding No Patient Has Alerts: No cellular tissue based products placed in the center since last visit: Has Dressing in Place as Prescribed: Yes Has Compression in Place as Prescribed: Yes Pain Present Now: No Electronic Signature(s) Signed: 04/05/2022 4:09:50 PM By: Deon Pilling RN, BSN Entered By: Deon Pilling on 04/04/2022 13:54:59 -------------------------------------------------------------------------------- Encounter Discharge Information Details Patient Name: Date of Service: [name]. 04/04/2022 1:30 PM Medical Record Number: [id number] Patient Account Number: [id number] Date of Birth/Sex: Treating RN: 10-24-1950 (72 y.o. [name] Primary Care Samul Mcinroy: Shon Baton Other Clinician: Referring Bracha Frankowski: Treating Lelon Ikard/Extender: Golda Acre in Treatment: 2 Encounter Discharge  Information Items Post Procedure Vitals Discharge Condition: Stable Unable to obtain vitals Reason: nurse visit only- santyl with dressing change. Ambulatory Status: Wheelchair Discharge Destination: Home Transportation: Private Auto Accompanied By: self Schedule Follow-up Appointment: Yes Clinical Summary of Care: Electronic Signature(s) Signed: 04/05/2022 4:09:50 PM By: Deon Pilling RN, BSN Entered By: Deon Pilling on 04/04/2022 13:56:17 -------------------------------------------------------------------------------- Wound Assessment Details Patient Name: Date of Service: [name]. 04/04/2022 1:30 PM Medical Record Number: [id number] Patient Account Number: [id number] Date of Birth/Sex: Treating RN: January 24, 1950 (72 y.o. [name] Primary Care Bereket Gernert: Shon Baton Other Clinician: Referring Duwane Gewirtz: Treating Airik Goodlin/Extender: Golda Acre in Treatment: 2 Wound Status Wound Number: 1 Primary Etiology: Dehisced Wound [name], [name] ([id number]) [id number].pdf Page 2 of 2 Wound Location: Left, Anterior Lower Leg Wound Status: Open Wounding Event: Surgical Injury Notes: pt. had a Melanoma removed by Dr. Sarajane Jews Date Acquired: 03/01/2022 Weeks Of Treatment: 2 Clustered Wound: No Wound Measurements Length: (cm) 4 Width: (cm) 2.2 Depth: (cm) 0.1 Area: (cm) 6.912 Volume: (cm) 0.691 % Reduction in Area: 57.1% % Reduction in Volume: 57.1% Wound Description Classification: Full Thickness Without Exposed Suppor Exudate Amount: Medium Exudate Type: Serosanguineous Exudate Color: red, brown t Structures Periwound Skin Texture Texture Color No Abnormalities Noted: No No Abnormalities Noted: No Moisture No Abnormalities Noted: No Treatment Notes Wound #1 (Lower Leg) Wound Laterality: Left, Anterior Cleanser Soap and Water Discharge Instruction: May shower and wash wound with dial antibacterial soap and water prior  to dressing change. Vashe 5.8 (oz) Discharge Instruction: Cleanse the wound with Vashe prior to applying a clean dressing using gauze sponges, not tissue or cotton balls. Peri-Wound Care Sween Lotion (Moisturizing lotion) Discharge Instruction: Apply moisturizing lotion as directed Topical Primary Dressing Hydrofera Blue Ready Transfer Foam, 4x5 (in/in) Discharge Instruction: Apply to wound bed as instructed Santyl Ointment Discharge Instruction: Apply nickel thick amount to wound bed as instructed Secondary Dressing Woven Gauze Sponge, Non-Sterile 4x4 in Discharge  Instruction: Apply over primary dressing as directed. Secured With [employer] 4x5 (in/yd) Discharge Instruction: Secure with Coban as directed. Kerlix Roll Sterile, 4.5x3.1 (in/yd) Discharge Instruction: Secure with Kerlix as directed. Compression Wrap Compression Stockings Add-Ons Electronic Signature(s) Signed: 04/05/2022 4:09:50 PM By: Deon Pilling RN, BSN Entered By: Deon Pilling on 04/04/2022 13:55:07

## 2022-04-05 NOTE — Progress Notes (Signed)
MITSUKO, MASTROGIOVANNI ([id number]) [id number] Page 1 of 1 Visit Report for 04/04/2022 Debridement Details Patient Name: Date of Service: [name], [name]. 04/04/2022 1:30 PM Medical Record Number: [id number] Patient Account Number: [id number] Date of Birth/Sex: Treating RN: 1950/06/03 (72 y.o. [name], [name] Primary Care Provider: Shon Baton Other Clinician: Referring Provider: Treating Provider/Extender: Golda Acre in Treatment: 2 Debridement Performed for Assessment: Wound #1 Left,Anterior Lower Leg Performed By: Clinician Desiree Pilling, RN Debridement Type: Chemical/Enzymatic/Mechanical Agent Used: Santyl Level of Consciousness (Pre-procedure): Awake and Alert Pre-procedure Verification/Time Out No Taken: Bleeding: None Response to Treatment: Procedure was tolerated well Level of Consciousness (Post- Awake and Alert procedure): Post Debridement Measurements of Total Wound Length: (cm) 4 Width: (cm) 2.2 Depth: (cm) 0.1 Volume: (cm) 0.691 Character of Wound/Ulcer Post Debridement: Stable Electronic Signature(s) Signed: 04/04/2022 2:21:36 PM By: Fredirick Maudlin MD FACS Signed: 04/05/2022 4:09:50 PM By: Desiree Pilling RN, BSN Entered By: [name] on 04/04/2022 13:55:44 -------------------------------------------------------------------------------- SuperBill Details Patient Name: Date of Service: Desiree Hose. 04/04/2022 Medical Record Number: [id number] Patient Account Number: [id number] Date of Birth/Sex: Treating RN: August 25, 1950 (72 y.o. [name] Primary Care Provider: Shon Baton Other Clinician: Referring Provider: Treating Provider/Extender: Golda Acre in Treatment: 2 Diagnosis Coding ICD-10 Codes Code Description [phone] Non-pressure chronic ulcer of other part of left lower leg with fat layer exposed T81.31XA Disruption of external operation (surgical) wound, not elsewhere classified,  initial encounter C43.9 Malignant melanoma of skin, unspecified G14 Postpolio syndrome C54.1 Malignant neoplasm of endometrium I89.0 Lymphedema, not elsewhere classified Facility Procedures : CPT4 Code: [id number] Description: [id number] - DEBRIDE W/O ANES NON SELECT Modifier: Quantity: 1 Electronic Signature(s) Signed: 04/04/2022 2:21:36 PM By: Fredirick Maudlin MD FACS Signed: 04/05/2022 4:09:50 PM By: Desiree Pilling RN, BSN Entered By: [name] on 04/04/2022 13:56:31

## 2022-04-11 ENCOUNTER — Encounter (HOSPITAL_BASED_OUTPATIENT_CLINIC_OR_DEPARTMENT_OTHER): Payer: Medicare Other | Admitting: Internal Medicine

## 2022-04-11 DIAGNOSIS — T8131XA Disruption of external operation (surgical) wound, not elsewhere classified, initial encounter: Secondary | ICD-10-CM | POA: Diagnosis not present

## 2022-04-11 DIAGNOSIS — L97822 Non-pressure chronic ulcer of other part of left lower leg with fat layer exposed: Secondary | ICD-10-CM

## 2022-04-11 DIAGNOSIS — I890 Lymphedema, not elsewhere classified: Secondary | ICD-10-CM | POA: Diagnosis not present

## 2022-04-11 DIAGNOSIS — G14 Postpolio syndrome: Secondary | ICD-10-CM | POA: Diagnosis not present

## 2022-04-11 DIAGNOSIS — Z85820 Personal history of malignant melanoma of skin: Secondary | ICD-10-CM | POA: Diagnosis not present

## 2022-04-11 DIAGNOSIS — Z8542 Personal history of malignant neoplasm of other parts of uterus: Secondary | ICD-10-CM | POA: Diagnosis not present

## 2022-04-11 NOTE — Progress Notes (Signed)
SOPHEY, STREHLE ([id number]) [id number] Page 1 of 8 Visit Report for 04/11/2022 Chief Complaint Document Details Patient Name: Date of Service: [name], [name]. 04/11/2022 1:30 PM Medical Record Number: [id number] Patient Account Number: [id number] Date of Birth/Sex: Treating RN: 01-10-1950 (72 y.o. F) Primary Care Provider: Creola Corn Other Clinician: Referring Provider: Treating Provider/Extender: Octavia Bruckner in Treatment: 3 Information Obtained from: Patient Chief Complaint 03/21/2022; left lower extremity wound Electronic Signature(s) Signed: 04/11/2022 4:03:08 PM By: Geralyn Corwin DO Entered By: Geralyn Corwin on 04/11/2022 14:25:52 -------------------------------------------------------------------------------- Debridement Details Patient Name: Date of Service: [name]. 04/11/2022 1:30 PM Medical Record Number: [id number] Patient Account Number: [id number] Date of Birth/Sex: Treating RN: [birth year] (72 y.o. [name], Millard.Loa Primary Care Provider: Creola Corn Other Clinician: Referring Provider: Treating Provider/Extender: Octavia Bruckner in Treatment: 3 Debridement Performed for Assessment: Wound #1 Left,Anterior Lower Leg Performed By: Physician Geralyn Corwin, DO Debridement Type: Debridement Level of Consciousness (Pre-procedure): Awake and Alert Pre-procedure Verification/Time Out Yes - 14:10 Taken: Start Time: 14:11 Pain Control: Lidocaine 4% T opical Solution T Area Debrided (L x W): otal 3 (cm) x 1.4 (cm) = 4.2 (cm) Tissue and other material debrided: Viable, Non-Viable, Slough, Subcutaneous, Skin: Dermis , Skin: Epidermis, Slough Level: Skin/Subcutaneous Tissue Debridement Description: Excisional Instrument: Curette Bleeding: Minimum Hemostasis Achieved: Pressure End Time: 14:18 Procedural Pain: 0 Post Procedural Pain: 0 Response to Treatment: Procedure was tolerated well Level  of Consciousness (Post- Awake and Alert procedure): Post Debridement Measurements of Total Wound Length: (cm) 3 Width: (cm) 1.4 Depth: (cm) 0.1 Volume: (cm) 0.33 Character of Wound/Ulcer Post Debridement: Requires Further Debridement Post Procedure Diagnosis Same as Pre-procedure Electronic Signature(s) Signed: 04/11/2022 4:03:08 PM By: Geralyn Corwin DO Signed: 04/11/2022 5:03:18 PM By: Shawn Stall RN, BSN Entered By: Shawn Stall on 04/11/2022 14:18:26 Desiree Form ([id number]) [id number] Page 2 of 8 -------------------------------------------------------------------------------- HPI Details Patient Name: Date of Service: [name], [name]. 04/11/2022 1:30 PM Medical Record Number: [id number] Patient Account Number: [id number] Date of Birth/Sex: Treating RN: 1950/03/20 (72 y.o. F) Primary Care Provider: Creola Corn Other Clinician: Referring Provider: Treating Provider/Extender: Octavia Bruckner in Treatment: 3 History of Present Illness HPI Description: 03/21/2022 Ms. [name] is a 72 year old female with a past medical history of postpolio syndrome, endometrial cancer and essential hypertension that presents the clinic for a 1 month history of nonhealing ulcer to the left lower extremity. She states that she had a melanoma removed from the left lower extremity on 2/27 by Dr. Irene Limbo. After the surgery the wound dehisced. They have been using compression wraps. It is unclear what dressing has been used. She has no issues or complaints today. She denies signs of infection. 3/25; Patient presents for follow-up. We have been using Santyl and Hydrofera Blue under Kerlix/Coban. There is been improvement in wound healing. She had no issues with the compression wrap. 4/8; patient presents for follow-up. We have been using Santyl Hydrofera Blue under Kerlix/Coban. Wound continues to improve in size and appearance. She still has slough  buildup. I think in the near future she would do well with a skin substitute. We discussed this and patient was agreeable to proceed with insurance verification. We will Desiree ahead and run her for PuraPly and Grafix. Electronic Signature(s) Signed: 04/11/2022 4:03:08 PM By: Geralyn Corwin DO Entered By: Geralyn Corwin on 04/11/2022 14:26:32 -------------------------------------------------------------------------------- Physical Exam Details Patient Name: Date of Service: Desiree Wynona Meals [name]. 04/11/2022 1:30  PM Medical Record Number: [id number] Patient Account Number: [id number] Date of Birth/Sex: Treating RN: 01-11-1950 (72 y.o. F) Primary Care Provider: Creola Corn Other Clinician: Referring Provider: Treating Provider/Extender: Octavia Bruckner in Treatment: 3 Constitutional respirations regular, non-labored and within target range for patient.. Cardiovascular 2+ dorsalis pedis/posterior tibialis pulses. Psychiatric pleasant and cooperative. Notes Left lower extremity: Open wound to the anterior aspect with granulation tissue and nonviable tissue. No signs of surrounding infection. Electronic Signature(s) Signed: 04/11/2022 4:03:08 PM By: Geralyn Corwin DO Entered By: Geralyn Corwin on 04/11/2022 14:27:17 -------------------------------------------------------------------------------- Physician Orders Details Patient Name: Date of Service: [name]. 04/11/2022 1:30 PM Medical Record Number: [id number] Patient Account Number: [id number] Date of Birth/Sex: Treating RN: 05/28/1950 (72 y.o. [name], [name] Primary Care Provider: Creola Corn Other Clinician: Referring Provider: Treating Provider/Extender: Octavia Bruckner in Treatment: 3 Verbal / Phone Orders: No Diagnosis [address] SHYENNE, MAGGARD ([id number]) [id number] Page 3 of 8 ICD-10 Coding Code Description [phone] Non-pressure chronic ulcer of other part of  left lower leg with fat layer exposed T81.31XA Disruption of external operation (surgical) wound, not elsewhere classified, initial encounter C43.9 Malignant melanoma of skin, unspecified G14 Postpolio syndrome C54.1 Malignant neoplasm of endometrium I89.0 Lymphedema, not elsewhere classified Follow-up Appointments ppointment in 1 week. - Dr. Mikey Bussing 04/18/2022 130pm Return A ppointment in 2 weeks. - Dr. Mikey Bussing 04/25/2022 130pm Return A Anesthetic (In clinic) Topical Lidocaine 4% applied to wound bed - used in clinic Bathing/ Shower/ Hygiene Other Bathing/Shower/Hygiene Orders/Instructions: - Please keep the left leg wraps dry (for a week) Edema Control - Lymphedema / SCD / Other Avoid standing for long periods of time. Exercise regularly - Walk/exercise a s tolerated throughout the day Moisturize legs daily. Off-Loading Other: - Elevate left leg as tolerated throughout the day. Wound Treatment Wound #1 - Lower Leg Wound Laterality: Left, Anterior Cleanser: Soap and Water 1 x Per Week/30 Days Discharge Instructions: May shower and wash wound with dial antibacterial soap and water prior to dressing change. Cleanser: Vashe 5.8 (oz) 1 x Per Week/30 Days Discharge Instructions: Cleanse the wound with Vashe prior to applying a clean dressing using gauze sponges, not tissue or cotton balls. Peri-Wound Care: Sween Lotion (Moisturizing lotion) 1 x Per Week/30 Days Discharge Instructions: Apply moisturizing lotion as directed Prim Dressing: Hydrofera Blue Ready Transfer Foam, 4x5 (in/in) 1 x Per Week/30 Days ary Discharge Instructions: Apply to wound bed as instructed Prim Dressing: Santyl Ointment 1 x Per Week/30 Days ary Discharge Instructions: Apply nickel thick amount to wound bed as instructed Secondary Dressing: Woven Gauze Sponge, Non-Sterile 4x4 in 1 x Per Week/30 Days Discharge Instructions: Apply over primary dressing as directed. Secured With: Coban Self-Adherent Wrap 4x5  (in/yd) 1 x Per Week/30 Days Discharge Instructions: Secure with Coban as directed. Secured With: [employer], 4.5x3.1 (in/yd) 1 x Per Week/30 Days Discharge Instructions: Secure with Kerlix as directed. Electronic Signature(s) Signed: 04/11/2022 4:03:08 PM By: Geralyn Corwin DO Entered By: Geralyn Corwin on 04/11/2022 14:27:24 -------------------------------------------------------------------------------- Problem List Details Patient Name: Date of Service: [name]. 04/11/2022 1:30 PM Medical Record Number: [id number] Patient Account Number: [id number] Date of Birth/Sex: Treating RN: [birth year] (72 y.o. [name], [name] Primary Care Provider: Creola Corn Other Clinician: Referring Provider: Treating Provider/Extender: Octavia Bruckner in Treatment: 3 Active Problems ICD-10 [name], [name] ([id number]) [id number] Page 4 of 8 Encounter Code Description Active Date MDM Diagnosis [phone] Non-pressure chronic ulcer of other part of left  lower leg with fat layer exposed3/18/2024 No Yes T81.31XA Disruption of external operation (surgical) wound, not elsewhere classified, 03/21/2022 No Yes initial encounter C43.9 Malignant melanoma of skin, unspecified 03/21/2022 No Yes G14 Postpolio syndrome 03/21/2022 No Yes C54.1 Malignant neoplasm of endometrium 03/21/2022 No Yes I89.0 Lymphedema, not elsewhere classified 03/21/2022 No Yes Inactive Problems Resolved Problems Electronic Signature(s) Signed: 04/11/2022 4:03:08 PM By: Geralyn Corwin DO Entered By: Geralyn Corwin on 04/11/2022 14:25:42 -------------------------------------------------------------------------------- Progress Note Details Patient Name: Date of Service: [name]. 04/11/2022 1:30 PM Medical Record Number: [id number] Patient Account Number: [id number] Date of Birth/Sex: Treating RN: 1950-04-15 (72 y.o. F) Primary Care Provider: Creola Corn Other  Clinician: Referring Provider: Treating Provider/Extender: Octavia Bruckner in Treatment: 3 Subjective Chief Complaint Information obtained from Patient 03/21/2022; left lower extremity wound History of Present Illness (HPI) 03/21/2022 [name] is a 72 year old female with a past medical history of postpolio syndrome, endometrial cancer and essential hypertension that presents the clinic for a 1 month history of nonhealing ulcer to the left lower extremity. She states that she had a melanoma removed from the left lower extremity on 2/27 by Dr. Irene Limbo. After the surgery the wound dehisced. They have been using compression wraps. It is unclear what dressing has been used. She has no issues or complaints today. She denies signs of infection. 3/25; Patient presents for follow-up. We have been using Santyl and Hydrofera Blue under Kerlix/Coban. There is been improvement in wound healing. She had no issues with the compression wrap. 4/8; patient presents for follow-up. We have been using Santyl Hydrofera Blue under Kerlix/Coban. Wound continues to improve in size and appearance. She still has slough buildup. I think in the near future she would do well with a skin substitute. We discussed this and patient was agreeable to proceed with insurance verification. We will Desiree ahead and run her for PuraPly and Grafix. Patient History Information obtained from Patient. Family History Cancer - Mother,Father,Siblings,Child,Paternal Grandparents,Maternal Grandparents, Heart Disease - Mother. Social History Never smoker, Marital Status - Married, Alcohol Use - Rarely, Drug Use - No History, Caffeine Use - Never. Medical History [name], SADOSKY ([id number]) [id number] Page 5 of 8 Respiratory Patient has history of Asthma Cardiovascular Patient has history of Hypertension - Unspecified essential HTN Oncologic Patient has history of Received  Chemotherapy, Received Radiation - 05/06/19-06/04/19 Vaginal Brachytherapy (endometrialcancer) Hospitalization/Surgery History - Robotic assisted total hysterectomy; Right T hip. otal Medical A Surgical History Notes nd Cardiovascular hyperlipidemia Musculoskeletal Hx: Polio -Left leg wears a brace Neurologic MG:NOIBB Objective Constitutional respirations regular, non-labored and within target range for patient.. Vitals Time Taken: 1:44 PM, Height: 62 in, Weight: 148 lbs, BMI: 27.1, Temperature: 98.2 F, Pulse: 88 bpm, Respiratory Rate: 18 breaths/min, Blood Pressure: 146/84 mmHg. Cardiovascular 2+ dorsalis pedis/posterior tibialis pulses. Psychiatric pleasant and cooperative. General Notes: Left lower extremity: Open wound to the anterior aspect with granulation tissue and nonviable tissue. No signs of surrounding infection. Integumentary (Hair, Skin) Wound #1 status is Open. Original cause of wound was Surgical Injury. The date acquired was: 03/01/2022. The wound has been in treatment 3 weeks. The wound is located on the Left,Anterior Lower Leg. The wound measures 3cm length x 1.4cm width x 0.1cm depth; 3.299cm^2 area and 0.33cm^3 volume. There is Fat Layer (Subcutaneous Tissue) exposed. There is no tunneling or undermining noted. There is a medium amount of serosanguineous drainage noted. There is medium (34-66%) granulation within the wound bed. There is a medium (34-66%) amount of necrotic  tissue within the wound bed. The periwound skin appearance did not exhibit: Callus, Crepitus, Excoriation, Induration, Rash, Scarring, Dry/Scaly, Maceration, Atrophie Blanche, Cyanosis, Ecchymosis, Hemosiderin Staining, Mottled, Pallor, Rubor, Erythema. Assessment Active Problems ICD-10 Non-pressure chronic ulcer of other part of left lower leg with fat layer exposed Disruption of external operation (surgical) wound, not elsewhere classified, initial encounter Malignant melanoma of skin,  unspecified Postpolio syndrome Malignant neoplasm of endometrium Lymphedema, not elsewhere classified Patient's wound has shown improvement in size and appearance since last clinic visit. I debrided nonviable tissue. I recommended continuing the course with Hydrofera Blue and Santyl under Kerlix/Coban. She would benefit from a skin substitute and we will run her for PuraPly and Grafix. Follow-up in 1 week. Procedures Wound #1 Pre-procedure diagnosis of Wound #1 is a Dehisced Wound located on the Left,Anterior Lower Leg . There was a Excisional Skin/Subcutaneous Tissue Debridement with a total area of 4.2 sq cm performed by Geralyn CorwinHoffman, Phelan Goers, DO. With the following instrument(s): Curette to remove Viable and Non-Viable tissue/material. Material removed includes Subcutaneous Tissue, Slough, Skin: Dermis, and Skin: Epidermis after achieving pain control using Lidocaine 4% T opical Solution. A time out was conducted at 14:10, prior to the start of the procedure. A Minimum amount of bleeding was controlled with Pressure. The procedure was tolerated well with a pain level of 0 throughout and a pain level of 0 following the procedure. Post Debridement Measurements: 3cm length x 1.4cm width x 0.1cm depth; 0.33cm^3 volume. Character of Wound/Ulcer Post Debridement requires further debridement. Post procedure Diagnosis Wound #1: Same as Pre-Procedure [name], [name] ([id number]) [id number] Page 6 of 8 Plan Follow-up Appointments: Return Appointment in 1 week. - Dr. Mikey BussingHoffman 04/18/2022 130pm Return Appointment in 2 weeks. - Dr. Mikey BussingHoffman 04/25/2022 130pm Anesthetic: (In clinic) Topical Lidocaine 4% applied to wound bed - used in clinic Bathing/ Shower/ Hygiene: Other Bathing/Shower/Hygiene Orders/Instructions: - Please keep the left leg wraps dry (for a week) Edema Control - Lymphedema / SCD / Other: Avoid standing for long periods of time. Exercise regularly - Walk/exercise a s  tolerated throughout the day Moisturize legs daily. Off-Loading: Other: - Elevate left leg as tolerated throughout the day. WOUND #1: - Lower Leg Wound Laterality: Left, Anterior Cleanser: Soap and Water 1 x Per Week/30 Days Discharge Instructions: May shower and wash wound with dial antibacterial soap and water prior to dressing change. Cleanser: Vashe 5.8 (oz) 1 x Per Week/30 Days Discharge Instructions: Cleanse the wound with Vashe prior to applying a clean dressing using gauze sponges, not tissue or cotton balls. Peri-Wound Care: Sween Lotion (Moisturizing lotion) 1 x Per Week/30 Days Discharge Instructions: Apply moisturizing lotion as directed Prim Dressing: Hydrofera Blue Ready Transfer Foam, 4x5 (in/in) 1 x Per Week/30 Days ary Discharge Instructions: Apply to wound bed as instructed Prim Dressing: Santyl Ointment 1 x Per Week/30 Days ary Discharge Instructions: Apply nickel thick amount to wound bed as instructed Secondary Dressing: Woven Gauze Sponge, Non-Sterile 4x4 in 1 x Per Week/30 Days Discharge Instructions: Apply over primary dressing as directed. Secured With: Coban Self-Adherent Wrap 4x5 (in/yd) 1 x Per Week/30 Days Discharge Instructions: Secure with Coban as directed. Secured With: [employer] Roll Sterile, 4.5x3.1 (in/yd) 1 x Per Week/30 Days Discharge Instructions: Secure with Kerlix as directed. 1. In office sharp debridement 2. Hydrofera Blue and Santyl under Kerlix/Cobanooleft lower extremity 3. Run IVR for Grafix and PuraPly 4. Follow-up in 1 week Electronic Signature(s) Signed: 04/11/2022 4:03:08 PM By: Geralyn CorwinHoffman, Brixton Schnapp DO Entered By: Geralyn CorwinHoffman, Yunuen Mordan on 04/11/2022 14:28:34 --------------------------------------------------------------------------------  HxROS Details Patient Name: Date of Service: [name], [name]. 04/11/2022 1:30 PM Medical Record Number: [id number] Patient Account Number: [id number] Date of Birth/Sex: Treating RN: 1950-10-08 (72 y.o.  F) Primary Care Provider: Creola Corn Other Clinician: Referring Provider: Treating Provider/Extender: Octavia Bruckner in Treatment: 3 Information Obtained From Patient Respiratory Medical History: Positive for: Asthma Cardiovascular Medical History: Positive for: Hypertension - Unspecified essential HTN Past Medical History Notes: hyperlipidemia Musculoskeletal Medical History: Past Medical History NotesMarland Kitchen JAKYRAH, HOLLADAY ([id number]) [id number] Page 7 of 8 Hx: Polio -Left leg wears a brace Neurologic Medical History: Past Medical History Notes: NW:GNFAO Oncologic Medical History: Positive for: Received Chemotherapy; Received Radiation - 05/06/19-06/04/19 Vaginal Brachytherapy (endometrialcancer) Immunizations Pneumococcal Vaccine: Received Pneumococcal Vaccination: Yes Received Pneumococcal Vaccination On or After 60th Birthday: Yes Implantable Devices Yes Hospitalization / Surgery History Type of Hospitalization/Surgery Robotic assisted total hysterectomy; Right T hip otal Family and Social History Cancer: Yes - Mother,Father,Siblings,Child,Paternal Grandparents,Maternal Grandparents; Heart Disease: Yes - Mother; Never smoker; Marital Status - Married; Alcohol Use: Rarely; Drug Use: No History; Caffeine Use: Never; Financial Concerns: No; Food, Clothing or Shelter Needs: No; Support System Lacking: No; Transportation Concerns: No Electronic Signature(s) Signed: 04/11/2022 4:03:08 PM By: Geralyn Corwin DO Entered By: Geralyn Corwin on 04/11/2022 14:26:37 -------------------------------------------------------------------------------- SuperBill Details Patient Name: Date of Service: [name]. 04/11/2022 Medical Record Number: [id number] Patient Account Number: [id number] Date of Birth/Sex: Treating RN: October 27, 1950 (72 y.o. [name] Primary Care Provider: Creola Corn Other Clinician: Referring  Provider: Treating Provider/Extender: Octavia Bruckner in Treatment: 3 Diagnosis Coding ICD-10 Codes Code Description [phone] Non-pressure chronic ulcer of other part of left lower leg with fat layer exposed T81.31XA Disruption of external operation (surgical) wound, not elsewhere classified, initial encounter C43.9 Malignant melanoma of skin, unspecified G14 Postpolio syndrome C54.1 Malignant neoplasm of endometrium I89.0 Lymphedema, not elsewhere classified Facility Procedures : CPT4 Code: [id number] Description: 11042 - DEB SUBQ TISSUE 20 SQ CM/< ICD-10 Diagnosis Description L97.822 Non-pressure chronic ulcer of other part of left lower leg with fat layer expose T81.31XA Disruption of external operation (surgical) wound, not elsewhere classified,  ini Modifier: d tial encounter Quantity: 1 Physician Procedures : CPT4 Code Description Modifier [id number] 11042 - WC PHYS SUBQ TISS 20 SQ CM ICD-10 Diagnosis Description L97.822 Non-pressure chronic ulcer of other part of left lower leg with fat layer exposed NEFTALY, INZUNZA ([id number])  [id number] T81.31XA Disruption of external operation (surgical) wound, not elsewhere classified, initial encounter Quantity: 1 27.pdf Page 8 of 8 Electronic Signature(s) Signed: 04/11/2022 4:03:08 PM By: Geralyn Corwin DO Entered By: Geralyn Corwin on 04/11/2022 14:28:49

## 2022-04-12 NOTE — Progress Notes (Signed)
TELMA, CHRIST ([id number]) [id number] Page 1 of 7 Visit Report for 04/11/2022 Arrival Information Details Patient Name: Date of Service: [name], [name]. 04/11/2022 1:30 PM Medical Record Number: [id number] Patient Account Number: [id number] Date of Birth/Sex: Treating RN: 07-13-1950 (72 y.o. F) Primary Care Guillaume Weninger: Creola Corn Other Clinician: Referring Glenn Gullickson: Treating Nayan Proch/Extender: Octavia Bruckner in Treatment: 3 Visit Information History Since Last Visit Added or deleted any medications: No Patient Arrived: Wheel Chair Any new allergies or adverse reactions: No Arrival Time: 13:34 Had a fall or experienced change in No Accompanied By: self activities of daily living that may affect Transfer Assistance: None risk of falls: Patient Identification Verified: Yes Signs or symptoms of abuse/neglect since last visito No Secondary Verification Process Completed: Yes Hospitalized since last visit: No Patient Requires Transmission-Based Precautions: No Implantable device outside of the clinic excluding No Patient Has Alerts: No cellular tissue based products placed in the center since last visit: Has Dressing in Place as Prescribed: Yes Pain Present Now: No Electronic Signature(s) Signed: 04/12/2022 2:53:03 PM By: Thayer Dallas Entered By: Thayer Dallas on 04/11/2022 13:44:05 -------------------------------------------------------------------------------- Encounter Discharge Information Details Patient Name: Date of Service: Desiree Congress. 04/11/2022 1:30 PM Medical Record Number: [id number] Patient Account Number: [id number] Date of Birth/Sex: Treating RN: 18-Feb-1950 (72 y.o. Debara Pickett, Yvonne Kendall Primary Care Dalana Pfahler: Creola Corn Other Clinician: Referring Meilah Delrosario: Treating Hartwell Vandiver/Extender: Octavia Bruckner in Treatment: 3 Encounter Discharge Information Items Post Procedure Vitals Discharge Condition:  Stable Temperature (F): 98.2 Ambulatory Status: Wheelchair Pulse (bpm): 88 Discharge Destination: Home Respiratory Rate (breaths/min): 20 Transportation: Private Auto Blood Pressure (mmHg): 146/84 Accompanied By: self Schedule Follow-up Appointment: Yes Clinical Summary of Care: Electronic Signature(s) Signed: 04/11/2022 5:03:18 PM By: Shawn Stall RN, BSN Entered By: Shawn Stall on 04/11/2022 14:20:21 -------------------------------------------------------------------------------- Lower Extremity Assessment Details Patient Name: Date of Service: Desiree Desiree Stall. 04/11/2022 1:30 PM Medical Record Number: [id number] Patient Account Number: [id number] Date of Birth/Sex: Treating RN: May 30, 1950 (72 y.o. F) Primary Care Tiburcio Linder: Creola Corn Other Clinician: Referring Kayda Allers: Treating Isiaih Hollenbach/Extender: Octavia Bruckner in Treatment: 3 Edema Assessment Assessed: Kyra Searles: No] [Right: No] G[LeftADDILYNNE, [name] ([id number])] [Right: [id number] Page 2 of 7] Edema: [Left: Ye] [Right: s] Calf Left: Right: Point of Measurement: 27 cm From Medial Instep 31.5 cm Ankle Left: Right: Point of Measurement: 9 cm From Medial Instep 26 cm Electronic Signature(s) Signed: 04/12/2022 2:53:03 PM By: Thayer Dallas Entered By: Thayer Dallas on 04/11/2022 14:01:26 -------------------------------------------------------------------------------- Multi Wound Chart Details Patient Name: Date of Service: Desiree Congress. 04/11/2022 1:30 PM Medical Record Number: [id number] Patient Account Number: [id number] Date of Birth/Sex: Treating RN: 1950/11/03 (72 y.o. F) Primary Care Dajia Gunnels: Creola Corn Other Clinician: Referring Shelva Hetzer: Treating Ivadell Gaul/Extender: Octavia Bruckner in Treatment: 3 Vital Signs Height(in): 62 Pulse(bpm): 88 Weight(lbs): 148 Blood Pressure(mmHg): 146/84 Body Mass Index(BMI): 27.1 Temperature(F):  98.2 Respiratory Rate(breaths/min): 18 [1:Photos:] [[name]:[name]] Left, Anterior Lower Leg [name] [name] Wound Location: Surgical Injury [name] [name] Wounding Event: Dehisced Wound [name] [name] Primary Etiology: Asthma, Hypertension, Received [name] [name] Comorbid History: Chemotherapy, Received Radiation 03/01/2022 [name] [name] Date Acquired: 3 [name] [name] Weeks of Treatment: Open [name] [name] Wound Status: No [name] [name] Wound Recurrence: 3x1.4x0.1 [name] [name] Measurements L x W x D (cm) 3.299 [name] [name] A (cm) : rea 0.33 [name] [name] Volume (cm) : 79.50% [name] [name] % Reduction in A rea: 79.50% [name] [name] % Reduction in Volume: Full Thickness Without  Exposed [name] [name] Classification: Support Structures Medium [name] [name] Exudate A mount: Serosanguineous [name] [name] Exudate Type: red, brown [name] [name] Exudate Color: Medium (34-66%) [name] [name] Granulation A mount: Medium (34-66%) [name] [name] Necrotic A mount: Fat Layer (Subcutaneous Tissue): Yes [name] [name] Exposed Structures: Small (1-33%) [name] [name] Epithelialization: Debridement - Excisional [name] [name] Debridement: Pre-procedure Verification/Time Out 14:10 [name] [name] Taken: Lidocaine 4% Topical Solution [name] [name] Pain Control: Subcutaneous, Slough [name] [name] Tissue Debrided: Skin/Subcutaneous Tissue [name] [name] Level: 4.2 [name] [name] Debridement A (sq cm): rea Curette [name] [name] Instrument: Minimum [name] [name] Bleeding: [name], [name] ([id number]) [id number] Page 3 of 7 Pressure [name] [name] Hemostasis Achieved: 0 [name] [name] Procedural Pain: 0 [name] [name] Post Procedural Pain: Procedure was tolerated well [name] [name] Debridement Treatment Response: 3x1.4x0.1 [name] [name] Post Debridement Measurements L x W x D (cm) 0.33 [name] [name] Post Debridement Volume: (cm) Excoriation: No [name] [name] Periwound Skin Texture: Induration: No Callus: No Crepitus: No Rash: No Scarring: No Maceration: No [name] [name] Periwound Skin Moisture: Dry/Scaly: No Atrophie Blanche: No [name] [name] Periwound Skin  Color: Cyanosis: No Ecchymosis: No Erythema: No Hemosiderin Staining: No Mottled: No Pallor: No Rubor: No Debridement [name] [name] Procedures Performed: Treatment Notes Wound #1 (Lower Leg) Wound Laterality: Left, Anterior Cleanser Soap and Water Discharge Instruction: May shower and wash wound with dial antibacterial soap and water prior to dressing change. Vashe 5.8 (oz) Discharge Instruction: Cleanse the wound with Vashe prior to applying a clean dressing using gauze sponges, not tissue or cotton balls. Peri-Wound Care Sween Lotion (Moisturizing lotion) Discharge Instruction: Apply moisturizing lotion as directed Topical Primary Dressing Hydrofera Blue Ready Transfer Foam, 4x5 (in/in) Discharge Instruction: Apply to wound bed as instructed Santyl Ointment Discharge Instruction: Apply nickel thick amount to wound bed as instructed Secondary Dressing Woven Gauze Sponge, Non-Sterile 4x4 in Discharge Instruction: Apply over primary dressing as directed. Secured With [employer] 4x5 (in/yd) Discharge Instruction: Secure with Coban as directed. Kerlix Roll Sterile, 4.5x3.1 (in/yd) Discharge Instruction: Secure with Kerlix as directed. Compression Wrap Compression Stockings Add-Ons Electronic Signature(s) Signed: 04/11/2022 4:03:08 PM By: Geralyn Corwin DO Entered By: Geralyn Corwin on 04/11/2022 14:25:46 -------------------------------------------------------------------------------- Multi-Disciplinary Care Plan Details Patient Name: Date of Service: Desiree Congress. 04/11/2022 1:30 PM Medical Record Number: [id number] Patient Account Number: [id number] Date of Birth/Sex: Treating RN: 1950/06/18 (72 y.o. [name] Primary Care Modell Fendrick: Creola Corn Other Clinician: Eric Form ([id number]) [id number] Page 4 of 7 Referring Abigal Choung: Treating Saranda Legrande/Extender: Octavia Bruckner in Treatment: 3 Active  Inactive Wound/Skin Impairment Nursing Diagnoses: Impaired tissue integrity Goals: Patient/caregiver will verbalize understanding of skin care regimen Date Initiated: 03/21/2022 Target Resolution Date: 07/03/2022 Goal Status: Active Interventions: Assess ulceration(s) every visit Treatment Activities: Skin care regimen initiated : 03/21/2022 Notes: Electronic Signature(s) Signed: 04/11/2022 5:03:18 PM By: Shawn Stall RN, BSN Entered By: Shawn Stall on 04/11/2022 14:06:47 -------------------------------------------------------------------------------- Pain Assessment Details Patient Name: Date of Service: Desiree Congress. 04/11/2022 1:30 PM Medical Record Number: [id number] Patient Account Number: [id number] Date of Birth/Sex: Treating RN: 1950-05-16 (72 y.o. F) Primary Care Loriene Taunton: Creola Corn Other Clinician: Referring Gretna Bergin: Treating Fronnie Urton/Extender: Octavia Bruckner in Treatment: 3 Active Problems Location of Pain Severity and Description of Pain Patient Has Paino No Site Locations Pain Management and Medication Current Pain Management: Electronic Signature(s) Signed: 04/12/2022 2:53:03 PM By: Thayer Dallas Entered By: Thayer Dallas on 04/11/2022 13:55:29 Eric Form ([id number]) [id number] Page 5 of 7 -------------------------------------------------------------------------------- Patient/Caregiver Education  Details Patient Name: Date of Service: Desiree [name], [name]. 4/8/2024andnbsp1:30 PM Medical Record Number: [id number] Patient Account Number: [id number] Date of Birth/Gender: Treating RN: 09-08-1950 (72 y.o. Arta SilenceF) Deaton, Bobbi Primary Care Physician: Creola Cornusso, John Other Clinician: Referring Physician: Treating Physician/Extender: Octavia BrucknerHoffman, Jessica Russo, John Weeks in Treatment: 3 Education Assessment Education Provided To: Patient Education Topics Provided Wound/Skin Impairment: Handouts: Caring for Your  Ulcer Methods: Explain/Verbal Responses: Reinforcements needed Electronic Signature(s) Signed: 04/11/2022 5:03:18 PM By: Shawn Stalleaton, Bobbi RN, BSN Entered By: Shawn Stalleaton, Bobbi on 04/11/2022 14:06:58 -------------------------------------------------------------------------------- Wound Assessment Details Patient Name: Date of Service: Desiree CongressGO RDO [name], [name]. 04/11/2022 1:30 PM Medical Record Number: [id number] Patient Account Number: [id number] Date of Birth/Sex: Treating RN: 09-08-1950 (72 y.o. F) Primary Care Tobe Kervin: Creola Cornusso, John Other Clinician: Referring Aryona Sill: Treating Brookelle Pellicane/Extender: Octavia BrucknerHoffman, Jessica Russo, John Weeks in Treatment: 3 Wound Status Wound Number: 1 Primary Dehisced Wound Etiology: Wound Location: Left, Anterior Lower Leg Wound Status: Open Wounding Event: Surgical Injury Notes: pt. had a Melanoma removed by Dr. Irene LimboGoodrich Date Acquired: 03/01/2022 Comorbid Asthma, Hypertension, Received Chemotherapy, Received Weeks Of Treatment: 3 History: Radiation Clustered Wound: No Photos Wound Measurements Length: (cm) 3 Width: (cm) 1.4 Depth: (cm) 0.1 Area: (cm) 3.299 Volume: (cm) 0.33 % Reduction in Area: 79.5% % Reduction in Volume: 79.5% Epithelialization: Small (1-33%) Tunneling: No Undermining: No Wound Description Classification: Full Thickness Without Exposed Support Structures Exudate Amount: Medium Exudate Type: Serosanguineous [name], [name] ([id number]) Exudate Color: red, brown [id number] Page 6 of 7 Wound Bed Granulation Amount: Medium (34-66%) Exposed Structure Necrotic Amount: Medium (34-66%) Fat Layer (Subcutaneous Tissue) Exposed: Yes Periwound Skin Texture Texture Color No Abnormalities Noted: No No Abnormalities Noted: No Callus: No Atrophie Blanche: No Crepitus: No Cyanosis: No Excoriation: No Ecchymosis: No Induration: No Erythema: No Rash: No Hemosiderin Staining: No Scarring: No Mottled: No Pallor:  No Moisture Rubor: No No Abnormalities Noted: No Dry / Scaly: No Maceration: No Treatment Notes Wound #1 (Lower Leg) Wound Laterality: Left, Anterior Cleanser Soap and Water Discharge Instruction: May shower and wash wound with dial antibacterial soap and water prior to dressing change. Vashe 5.8 (oz) Discharge Instruction: Cleanse the wound with Vashe prior to applying a clean dressing using gauze sponges, not tissue or cotton balls. Peri-Wound Care Sween Lotion (Moisturizing lotion) Discharge Instruction: Apply moisturizing lotion as directed Topical Primary Dressing Hydrofera Blue Ready Transfer Foam, 4x5 (in/in) Discharge Instruction: Apply to wound bed as instructed Santyl Ointment Discharge Instruction: Apply nickel thick amount to wound bed as instructed Secondary Dressing Woven Gauze Sponge, Non-Sterile 4x4 in Discharge Instruction: Apply over primary dressing as directed. Secured With [employer] Self-Adherent Wrap 4x5 (in/yd) Discharge Instruction: Secure with [employer] as directed. Kerlix Roll Sterile, 4.5x3.1 (in/yd) Discharge Instruction: Secure with Kerlix as directed. Compression Wrap Compression Stockings Add-Ons Electronic Signature(s) Signed: 04/12/2022 2:53:03 PM By: Thayer Dallasick, Kimberly Entered By: Thayer Dallasick, Kimberly on 04/11/2022 14:03:29 -------------------------------------------------------------------------------- Vitals Details Patient Name: Date of Service: Desiree CongressGO RDO [name], [name]. 04/11/2022 1:30 PM Medical Record Number: [id number] Patient Account Number: [id number] Date of Birth/Sex: Treating RN: 09-08-1950 (72 y.o. F) Primary Care Genee Rann: Creola Cornusso, John Other Clinician: Referring Madailein Londo: Treating Kinsey Karch/Extender: Octavia BrucknerHoffman, Jessica Russo, John Weeks in Treatment: 3 [name], [name] ([id number]) [id number] Page 7 of 7 Vital Signs Time Taken: 13:44 Temperature (F): 98.2 Height (in): 62 Pulse (bpm): 88 Weight (lbs): 148 Respiratory Rate  (breaths/min): 18 Body Mass Index (BMI): 27.1 Blood Pressure (mmHg): 146/84 Reference Range: 80 - 120 mg / dl Electronic Signature(s) Signed: 04/12/2022 2:53:03 PM By: Thayer Dallasick, Kimberly  Entered By: Thayer Dallas on 04/11/2022 13:55:19

## 2022-04-18 ENCOUNTER — Encounter (HOSPITAL_BASED_OUTPATIENT_CLINIC_OR_DEPARTMENT_OTHER): Payer: Medicare Other | Admitting: Internal Medicine

## 2022-04-18 ENCOUNTER — Telehealth: Payer: Self-pay | Admitting: *Deleted

## 2022-04-18 DIAGNOSIS — T8131XA Disruption of external operation (surgical) wound, not elsewhere classified, initial encounter: Secondary | ICD-10-CM | POA: Diagnosis not present

## 2022-04-18 DIAGNOSIS — Z85820 Personal history of malignant melanoma of skin: Secondary | ICD-10-CM | POA: Diagnosis not present

## 2022-04-18 DIAGNOSIS — G14 Postpolio syndrome: Secondary | ICD-10-CM | POA: Diagnosis not present

## 2022-04-18 DIAGNOSIS — I890 Lymphedema, not elsewhere classified: Secondary | ICD-10-CM

## 2022-04-18 DIAGNOSIS — L97822 Non-pressure chronic ulcer of other part of left lower leg with fat layer exposed: Secondary | ICD-10-CM

## 2022-04-18 DIAGNOSIS — Z8542 Personal history of malignant neoplasm of other parts of uterus: Secondary | ICD-10-CM | POA: Diagnosis not present

## 2022-04-18 NOTE — Progress Notes (Signed)
[name], [name] ([id number]) [id number] Page 1 of 8 Visit Report for 04/18/2022 Chief Complaint Document Details Patient Name: Date of Service: [name]. 04/18/2022 1:30 PM Medical Record Number: [id number] Patient Account Number: [id number] Date of Birth/Sex: Treating RN: October 02, 1950 (72 y.o. F) Primary Care Provider: Creola Corn Other Clinician: Referring Provider: Treating Provider/Extender: Octavia Bruckner in Treatment: 4 Information Obtained from: Patient Chief Complaint 03/21/2022; left lower extremity wound Electronic Signature(s) Signed: 04/18/2022 3:17:23 PM By: Geralyn Corwin DO Entered By: Geralyn Corwin on 04/18/2022 14:12:51 -------------------------------------------------------------------------------- Cellular or Tissue Based Product Details Patient Name: Date of Service: [name]. 04/18/2022 1:30 PM Medical Record Number: [id number] Patient Account Number: [id number] Date of Birth/Sex: Treating RN: [birth year] (72 y.o. [name] Primary Care Provider: Creola Corn Other Clinician: Referring Provider: Treating Provider/Extender: Octavia Bruckner in Treatment: 4 Cellular or Tissue Based Product Type Wound #1 Left,Anterior Lower Leg Applied to: Performed By: Physician Geralyn Corwin, DO Cellular or Tissue Based Product Type: Puraply AM Level of Consciousness (Pre-procedure): Awake and Alert Pre-procedure Verification/Time Out Yes - 14:04 Taken: Location: trunk / arms / legs Wound Size (sq cm): 5.1 Product Size (sq cm): 12 Waste Size (sq cm): 0 Amount of Product Applied (sq cm): 12 Instrument Used: Forceps Lot #: [id number] Expiration Date: 04/23/2024 Fenestrated: No Reconstituted: Yes Solution Type: normal saline Solution Amount: 2ml Lot #: [id number] Solution Expiration Date: 06/02/2024 Secured: Yes Secured With: Steri-Strips Dressing Applied: Yes Primary Dressing:  adaptic Procedural Pain: 0 Post Procedural Pain: 0 Response to Treatment: Procedure was tolerated well Level of Consciousness (Post- Awake and Alert procedure): Post Procedure Diagnosis Same as Pre-procedure Notes Scribed for Dr Mikey Bussing by Redmond Pulling, RN Electronic Signature(s) SOFIJA, ANTWI [place] ([id number]) [phone].pdf Page 2 of 8 Signed: 04/18/2022 3:17:23 PM By: Geralyn Corwin DO Signed: 04/18/2022 4:01:47 PM By: Redmond Pulling RN, BSN Entered By: Redmond Pulling on 04/18/2022 14:07:57 -------------------------------------------------------------------------------- Debridement Details Patient Name: Date of Service: [name]. 04/18/2022 1:30 PM Medical Record Number: [id number] Patient Account Number: [id number] Date of Birth/Sex: Treating RN: 07-02-1950 (72 y.o. [name] Primary Care Provider: Creola Corn Other Clinician: Referring Provider: Treating Provider/Extender: Octavia Bruckner in Treatment: 4 Debridement Performed for Assessment: Wound #1 Left,Anterior Lower Leg Performed By: Physician Geralyn Corwin, DO Debridement Type: Debridement Level of Consciousness (Pre-procedure): Awake and Alert Pre-procedure Verification/Time Out Yes - 13:55 Taken: Start Time: 13:59 Pain Control: Lidocaine 4% T opical Solution T Area Debrided (L x W): otal 3 (cm) x 1.7 (cm) = 5.1 (cm) Tissue and other material debrided: Non-Viable, Slough, Subcutaneous, Slough Level: Skin/Subcutaneous Tissue Debridement Description: Excisional Instrument: Curette Bleeding: Minimum Hemostasis Achieved: Pressure Procedural Pain: 0 Post Procedural Pain: 0 Response to Treatment: Procedure was tolerated well Level of Consciousness (Post- Awake and Alert procedure): Post Debridement Measurements of Total Wound Length: (cm) 3 Width: (cm) 1.7 Depth: (cm) 0.1 Volume: (cm) 0.401 Character of Wound/Ulcer Post Debridement: Improved Post  Procedure Diagnosis Same as Pre-procedure Notes Scribed for Dr Mikey Bussing by Redmond Pulling, RN Electronic Signature(s) Signed: 04/18/2022 3:17:23 PM By: Geralyn Corwin DO Signed: 04/18/2022 4:01:47 PM By: Redmond Pulling RN, BSN Entered By: Redmond Pulling on 04/18/2022 14:00:53 -------------------------------------------------------------------------------- HPI Details Patient Name: Date of Service: [name]. 04/18/2022 1:30 PM Medical Record Number: [id number] Patient Account Number: [id number] Date of Birth/Sex: Treating RN: 01-Sep-1950 (72 y.o. F) Primary Care Provider: Creola Corn Other Clinician: Referring Provider: Treating Provider/Extender: Dyanne Carrel,  Vivianne Spence in Treatment: 4 History of Present Illness HPI Description: 03/21/2022 Ms. [name] is a 72 year old female with a past medical history of postpolio syndrome, endometrial cancer and essential hypertension that presents the clinic for a 1 month history of nonhealing ulcer to the left lower extremity. She states that she had a melanoma removed from the left lower extremity on 2/27 by Dr. Irene Limbo. After the surgery the wound dehisced. They have been using compression wraps. It is unclear what dressing has been used. She has no issues or complaints today. She denies signs of infection. KAIRY, BOLAM ([id number]) [id number] Page 3 of 8 3/25; Patient presents for follow-up. We have been using Santyl and Hydrofera Blue under Kerlix/Coban. There is been improvement in wound healing. She had no issues with the compression wrap. 4/8; patient presents for follow-up. We have been using Santyl Hydrofera Blue under Kerlix/Coban. Wound continues to improve in size and appearance. She still has slough buildup. I think in the near future she would do well with a skin substitute. We discussed this and patient was agreeable to proceed with insurance verification. We will Desiree ahead and run her  for PuraPly and Grafix. 4/15; patient presents for follow-up. We have been using Santyl and Hydrofera Blue under Kerlix/Coban. The wound is smaller. She has been approved for PuraPly and she is agreeable to proceed with placement today. She denies signs of infection. Electronic Signature(s) Signed: 04/18/2022 3:17:23 PM By: Geralyn Corwin DO Entered By: Geralyn Corwin on 04/18/2022 14:13:52 -------------------------------------------------------------------------------- Physical Exam Details Patient Name: Date of Service: [name]. 04/18/2022 1:30 PM Medical Record Number: [id number] Patient Account Number: [id number] Date of Birth/Sex: Treating RN: 1950/12/16 (72 y.o. F) Primary Care Provider: Creola Corn Other Clinician: Referring Provider: Treating Provider/Extender: Octavia Bruckner in Treatment: 4 Constitutional respirations regular, non-labored and within target range for patient.. Cardiovascular 2+ dorsalis pedis/posterior tibialis pulses. Psychiatric pleasant and cooperative. Notes Left lower extremity: Open wound to the anterior aspect with granulation tissue and nonviable tissue. No signs of surrounding infection. Electronic Signature(s) Signed: 04/18/2022 3:17:23 PM By: Geralyn Corwin DO Entered By: Geralyn Corwin on 04/18/2022 14:14:27 -------------------------------------------------------------------------------- Physician Orders Details Patient Name: Date of Service: [name]. 04/18/2022 1:30 PM Medical Record Number: [id number] Patient Account Number: [id number] Date of Birth/Sex: Treating RN: [birth year] (73 y.o. [name] Primary Care Provider: Creola Corn Other Clinician: Referring Provider: Treating Provider/Extender: Octavia Bruckner in Treatment: 4 Verbal / Phone Orders: No Diagnosis Coding Follow-up Appointments ppointment in 1 week. - Dr. Mikey Bussing 04/25/2022 130pm Return A ppointment in 2  weeks. - Dr. Mikey Bussing 05/02/2022 130pm Return A Anesthetic (In clinic) Topical Lidocaine 4% applied to wound bed - used in clinic Cellular or Tissue Based Products Wound #1 Left,Anterior Lower Leg Cellular or Tissue Based Product Type: - Puraply AM #1 applied 04/18/22 Cellular or Tissue Based Product applied to wound bed, secured with steri-strips, cover with Adaptic or Mepitel. (DO NOT REMOVE). Bathing/ Shower/ Hygiene May shower with protection but do not get wound dressing(s) wet. Protect dressing(s) with water repellant cover (for example, large plastic bag) or a cast cover and may then take shower. Edema Control - Lymphedema / SCD / Other RAMLA, PEPER ([id number]) [id number] Page 4 of 8 Elevate legs to the level of the heart or above for 30 minutes daily and/or when sitting for 3-4 times a day throughout the day. Avoid standing for long periods of time. Exercise regularly - Walk/exercise  a s tolerated throughout the day Moisturize legs daily. Wound Treatment Wound #1 - Lower Leg Wound Laterality: Left, Anterior Cleanser: Soap and Water 1 x Per Week/30 Days Discharge Instructions: May shower and wash wound with dial antibacterial soap and water prior to dressing change. Cleanser: Vashe 5.8 (oz) 1 x Per Week/30 Days Discharge Instructions: Cleanse the wound with Vashe prior to applying a clean dressing using gauze sponges, not tissue or cotton balls. Peri-Wound Care: Sween Lotion (Moisturizing lotion) 1 x Per Week/30 Days Discharge Instructions: Apply moisturizing lotion as directed Secondary Dressing: ABD Pad, 8x10 1 x Per Week/30 Days Discharge Instructions: Apply over primary dressing as directed. Secured With: Coban Self-Adherent Wrap 4x5 (in/yd) 1 x Per Week/30 Days Discharge Instructions: Secure with Coban as directed. Secured With: [employer], 4.5x3.1 (in/yd) 1 x Per Week/30 Days Discharge Instructions: Secure with Kerlix as  directed. Patient Medications llergies: mold, house dust, latex A Notifications Medication Indication Start End prior to debridement 04/18/2022 lidocaine DOSE topical 4 % cream - cream topical once daily Electronic Signature(s) Signed: 04/18/2022 3:17:23 PM By: Geralyn Corwin DO Signed: 04/18/2022 4:01:47 PM By: Redmond Pulling RN, BSN Entered By: Redmond Pulling on 04/18/2022 14:16:37 -------------------------------------------------------------------------------- Problem List Details Patient Name: Date of Service: [name]. 04/18/2022 1:30 PM Medical Record Number: [id number] Patient Account Number: [id number] Date of Birth/Sex: Treating RN: August 16, 1950 (72 y.o. F) Primary Care Provider: Creola Corn Other Clinician: Referring Provider: Treating Provider/Extender: Octavia Bruckner in Treatment: 4 Active Problems ICD-10 Encounter Code Description Active Date MDM Diagnosis [phone] Non-pressure chronic ulcer of other part of left lower leg with fat layer exposed3/18/2024 No Yes T81.31XA Disruption of external operation (surgical) wound, not elsewhere classified, 03/21/2022 No Yes initial encounter C43.9 Malignant melanoma of skin, unspecified 03/21/2022 No Yes G14 Postpolio syndrome 03/21/2022 No Yes C54.1 Malignant neoplasm of endometrium 03/21/2022 No Yes FOREST, PRUDEN ([id number]) [id number] Page 5 of 8 I89.0 Lymphedema, not elsewhere classified 03/21/2022 No Yes Inactive Problems Resolved Problems Electronic Signature(s) Signed: 04/18/2022 3:17:23 PM By: Geralyn Corwin DO Entered By: Geralyn Corwin on 04/18/2022 14:12:40 -------------------------------------------------------------------------------- Progress Note Details Patient Name: Date of Service: [name]. 04/18/2022 1:30 PM Medical Record Number: [id number] Patient Account Number: [id number] Date of Birth/Sex: Treating RN: 1950/12/03 (72 y.o. F) Primary Care  Provider: Creola Corn Other Clinician: Referring Provider: Treating Provider/Extender: Octavia Bruckner in Treatment: 4 Subjective Chief Complaint Information obtained from Patient 03/21/2022; left lower extremity wound History of Present Illness (HPI) 03/21/2022 Ms. Katriel Cutsforth is a 72 year old female with a past medical history of postpolio syndrome, endometrial cancer and essential hypertension that presents the clinic for a 1 month history of nonhealing ulcer to the left lower extremity. She states that she had a melanoma removed from the left lower extremity on 2/27 by Dr. Irene Limbo. After the surgery the wound dehisced. They have been using compression wraps. It is unclear what dressing has been used. She has no issues or complaints today. She denies signs of infection. 3/25; Patient presents for follow-up. We have been using Santyl and Hydrofera Blue under Kerlix/Coban. There is been improvement in wound healing. She had no issues with the compression wrap. 4/8; patient presents for follow-up. We have been using Santyl Hydrofera Blue under Kerlix/Coban. Wound continues to improve in size and appearance. She still has slough buildup. I think in the near future she would do well with a skin substitute. We discussed this and patient was agreeable to proceed  with insurance verification. We will Desiree ahead and run her for PuraPly and Grafix. 4/15; patient presents for follow-up. We have been using Santyl and Hydrofera Blue under Kerlix/Coban. The wound is smaller. She has been approved for PuraPly and she is agreeable to proceed with placement today. She denies signs of infection. Patient History Information obtained from Patient. Family History Cancer - Mother,Father,Siblings,Child,Paternal Grandparents,Maternal Grandparents, Heart Disease - Mother. Social History Never smoker, Marital Status - Married, Alcohol Use - Rarely, Drug Use - No History, Caffeine Use -  Never. Medical History Respiratory Patient has history of Asthma Cardiovascular Patient has history of Hypertension - Unspecified essential HTN Oncologic Patient has history of Received Chemotherapy, Received Radiation - 05/06/19-06/04/19 Vaginal Brachytherapy (endometrialcancer) Hospitalization/Surgery History - Robotic assisted total hysterectomy; Right T hip. otal Medical A Surgical History Notes nd Cardiovascular hyperlipidemia Musculoskeletal Hx: Polio -Left leg wears a brace Neurologic ZO:XWRUE ALYSSA, ROTONDO ([id number]) [id number] Page 6 of 8 Objective Constitutional respirations regular, non-labored and within target range for patient.. Vitals Time Taken: 1:38 PM, Height: 62 in, Weight: 148 lbs, BMI: 27.1, Temperature: 98.3 F, Pulse: 99 bpm, Respiratory Rate: 18 breaths/min, Blood Pressure: 123/76 mmHg. Cardiovascular 2+ dorsalis pedis/posterior tibialis pulses. Psychiatric pleasant and cooperative. General Notes: Left lower extremity: Open wound to the anterior aspect with granulation tissue and nonviable tissue. No signs of surrounding infection. Integumentary (Hair, Skin) Wound #1 status is Open. Original cause of wound was Surgical Injury. The date acquired was: 03/01/2022. The wound has been in treatment 4 weeks. The wound is located on the Left,Anterior Lower Leg. The wound measures 3cm length x 1.7cm width x 0.1cm depth; 4.006cm^2 area and 0.401cm^3 volume. There is Fat Layer (Subcutaneous Tissue) exposed. There is no tunneling or undermining noted. There is a medium amount of serosanguineous drainage noted. There is large (67-100%) red granulation within the wound bed. There is a small (1-33%) amount of necrotic tissue within the wound bed including Adherent Slough. The periwound skin appearance did not exhibit: Callus, Crepitus, Excoriation, Induration, Rash, Scarring, Dry/Scaly, Maceration, Atrophie Blanche, Cyanosis, Ecchymosis,  Hemosiderin Staining, Mottled, Pallor, Rubor, Erythema. Assessment Active Problems ICD-10 Non-pressure chronic ulcer of other part of left lower leg with fat layer exposed Disruption of external operation (surgical) wound, not elsewhere classified, initial encounter Malignant melanoma of skin, unspecified Postpolio syndrome Malignant neoplasm of endometrium Lymphedema, not elsewhere classified Patient's wound appears well-healing. I debrided nonviable tissue. I used silver nitrate to the hyper granulated areas. PuraPly was placed in standard fashion. Continued Kerlix/Coban. Follow-up in 1 week. Procedures Wound #1 Pre-procedure diagnosis of Wound #1 is a Dehisced Wound located on the Left,Anterior Lower Leg . There was a Excisional Skin/Subcutaneous Tissue Debridement with a total area of 5.1 sq cm performed by Geralyn Corwin, DO. With the following instrument(s): Curette to remove Non-Viable tissue/material. Material removed includes Subcutaneous Tissue and Slough and after achieving pain control using Lidocaine 4% T opical Solution. No specimens were taken. A time out was conducted at 13:55, prior to the start of the procedure. A Minimum amount of bleeding was controlled with Pressure. The procedure was tolerated well with a pain level of 0 throughout and a pain level of 0 following the procedure. Post Debridement Measurements: 3cm length x 1.7cm width x 0.1cm depth; 0.401cm^3 volume. Character of Wound/Ulcer Post Debridement is improved. Post procedure Diagnosis Wound #1: Same as Pre-Procedure General Notes: Scribed for Dr Mikey Bussing by Redmond Pulling, RN. Pre-procedure diagnosis of Wound #1 is a Dehisced Wound located on the Left,Anterior Lower Leg.  A skin graft procedure using a bioengineered skin substitute/cellular or tissue based product was performed by Geralyn Corwin, DO with the following instrument(s): Forceps. Puraply AM was applied and secured with Steri-Strips. 12 sq cm of  product was utilized and 0 sq cm was wasted. Post Application, adaptic was applied. A Time Out was conducted at 14:04, prior to the start of the procedure. The procedure was tolerated well with a pain level of 0 throughout and a pain level of 0 following the procedure. Post procedure Diagnosis Wound #1: Same as Pre-Procedure General Notes: Scribed for Dr Mikey Bussing by Redmond Pulling, RN. Plan Follow-up Appointments: Return Appointment in 1 week. - Dr. Mikey Bussing 04/18/2022 130pm Return Appointment in 2 weeks. - Dr. Mikey Bussing 04/25/2022 130pm Anesthetic: (In clinic) Topical Lidocaine 4% applied to wound bed - used in clinic JAINE, ESTABROOKS ([id number]) [id number] Page 7 of 8 Bathing/ Shower/ Hygiene: Other Bathing/Shower/Hygiene Orders/Instructions: - Please keep the left leg wraps dry (for a week) Edema Control - Lymphedema / SCD / Other: Avoid standing for long periods of time. Exercise regularly - Walk/exercise a s tolerated throughout the day Moisturize legs daily. Off-Loading: Other: - Elevate left leg as tolerated throughout the day. WOUND #1: - Lower Leg Wound Laterality: Left, Anterior Cleanser: Soap and Water 1 x Per Week/30 Days Discharge Instructions: May shower and wash wound with dial antibacterial soap and water prior to dressing change. Cleanser: Vashe 5.8 (oz) 1 x Per Week/30 Days Discharge Instructions: Cleanse the wound with Vashe prior to applying a clean dressing using gauze sponges, not tissue or cotton balls. Peri-Wound Care: Sween Lotion (Moisturizing lotion) 1 x Per Week/30 Days Discharge Instructions: Apply moisturizing lotion as directed Prim Dressing: Hydrofera Blue Ready Transfer Foam, 4x5 (in/in) 1 x Per Week/30 Days ary Discharge Instructions: Apply to wound bed as instructed Prim Dressing: Santyl Ointment 1 x Per Week/30 Days ary Discharge Instructions: Apply nickel thick amount to wound bed as instructed Secondary Dressing: Woven Gauze  Sponge, Non-Sterile 4x4 in 1 x Per Week/30 Days Discharge Instructions: Apply over primary dressing as directed. Secured With: Coban Self-Adherent Wrap 4x5 (in/yd) 1 x Per Week/30 Days Discharge Instructions: Secure with Coban as directed. Secured With: [employer], 4.5x3.1 (in/yd) 1 x Per Week/30 Days Discharge Instructions: Secure with Kerlix as directed. 1. In office sharp debridement 2. Silver nitrate 3. PuraPly placed in standard fashion 4. Kerlix/Cobanooleft lower extremity 5. Follow-up in 1 week Electronic Signature(s) Signed: 04/18/2022 3:17:23 PM By: Geralyn Corwin DO Entered By: Geralyn Corwin on 04/18/2022 14:15:49 -------------------------------------------------------------------------------- HxROS Details Patient Name: Date of Service: Desiree David Stall. 04/18/2022 1:30 PM Medical Record Number: [id number] Patient Account Number: [id number] Date of Birth/Sex: Treating RN: September 15, 1950 (72 y.o. F) Primary Care Provider: Creola Corn Other Clinician: Referring Provider: Treating Provider/Extender: Octavia Bruckner in Treatment: 4 Information Obtained From Patient Respiratory Medical History: Positive for: Asthma Cardiovascular Medical History: Positive for: Hypertension - Unspecified essential HTN Past Medical History Notes: hyperlipidemia Musculoskeletal Medical History: Past Medical History Notes: Hx: Polio -Left leg wears a brace Neurologic Medical History: Past Medical History Notes: NW:GNFAO Oncologic Medical History: Positive for: Received Chemotherapy; Received Radiation - 05/06/19-06/04/19 Vaginal Brachytherapy (endometrialcancer) TURKESSA, OSTROM ([id number]) [id number] Page 8 of 8 Immunizations Pneumococcal Vaccine: Received Pneumococcal Vaccination: Yes Received Pneumococcal Vaccination On or After 60th Birthday: Yes Implantable Devices Yes Hospitalization / Surgery History Type of  Hospitalization/Surgery Robotic assisted total hysterectomy; Right T hip otal Family and Social History Cancer: Yes - Mother,Father,Siblings,Child,Paternal Grandparents,Maternal Grandparents;  Heart Disease: Yes - Mother; Never smoker; Marital Status - Married; Alcohol Use: Rarely; Drug Use: No History; Caffeine Use: Never; Financial Concerns: No; Food, Clothing or Shelter Needs: No; Support System Lacking: No; Transportation Concerns: No Electronic Signature(s) Signed: 04/18/2022 3:17:23 PM By: Geralyn Corwin DO Entered By: Geralyn Corwin on 04/18/2022 14:13:57 -------------------------------------------------------------------------------- SuperBill Details Patient Name: Date of Service: [name]. 04/18/2022 Medical Record Number: [id number] Patient Account Number: [id number] Date of Birth/Sex: Treating RN: Apr 28, 1950 (72 y.o. F) Primary Care Provider: Creola Corn Other Clinician: Referring Provider: Treating Provider/Extender: Octavia Bruckner in Treatment: 4 Diagnosis Coding ICD-10 Codes Code Description [phone] Non-pressure chronic ulcer of other part of left lower leg with fat layer exposed T81.31XA Disruption of external operation (surgical) wound, not elsewhere classified, initial encounter C43.9 Malignant melanoma of skin, unspecified G14 Postpolio syndrome C54.1 Malignant neoplasm of endometrium I89.0 Lymphedema, not elsewhere classified Facility Procedures : CPT4 Code: [id number] Description: Q4196 PuraPly AM 3X4 (12sq. cm) enter 12qty Modifier: Quantity: 12 : CPT4 Code: [id number] Description: 15271 - SKIN SUB GRAFT TRNK/ARM/LEG ICD-10 Diagnosis Description D66.440 Non-pressure chronic ulcer of other part of left lower leg with fat layer expos I89.0 Lymphedema, not elsewhere classified Modifier: ed Quantity: 1 Physician Procedures : CPT4 Code Description Modifier [id number] 15271 - WC PHYS SKIN SUB GRAFT TRNK/ARM/LEG ICD-10 Diagnosis  Description L97.822 Non-pressure chronic ulcer of other part of left lower leg with fat layer exposed I89.0 Lymphedema, not elsewhere classified Quantity: 1 Electronic Signature(s) Signed: 04/18/2022 3:17:23 PM By: Geralyn Corwin DO Entered By: Geralyn Corwin on 04/18/2022 14:20:38

## 2022-04-18 NOTE — Telephone Encounter (Signed)
Talbert Forest from radiation called and scheduled the patient for a follow up appt with  Dr Pricilla Holm on 6/27 at 3:30. Appt scheduled and Talbert Forest will contact the patient with the appt date/time.

## 2022-04-18 NOTE — Progress Notes (Signed)
CHIYOKO, TORRICO ([id number]) [id number] Page 1 of 6 Visit Report for 04/18/2022 Arrival Information Details Patient Name: Date of Service: [name], [name]. 04/18/2022 1:30 PM Medical Record Number: [id number] Patient Account Number: [id number] Date of Birth/Sex: Treating RN: 11-10-1950 (72 y.o. [name] Primary Care Tobyn Osgood: Creola Corn Other Clinician: Referring Robena Ewy: Treating Rickey Farrier/Extender: Octavia Bruckner in Treatment: 4 Visit Information History Since Last Visit Added or deleted any medications: No Patient Arrived: Wheel Chair Any new allergies or adverse reactions: No Arrival Time: 13:32 Had a fall or experienced change in No Accompanied By: husband activities of daily living that may affect Transfer Assistance: None risk of falls: Patient Identification Verified: Yes Signs or symptoms of abuse/neglect since last visito No Secondary Verification Process Completed: Yes Hospitalized since last visit: No Patient Requires Transmission-Based Precautions: No Implantable device outside of the clinic excluding No Patient Has Alerts: No cellular tissue based products placed in the center since last visit: Has Dressing in Place as Prescribed: Yes Has Compression in Place as Prescribed: Yes Pain Present Now: No Electronic Signature(s) Signed: 04/18/2022 4:01:47 PM By: Redmond Pulling RN, BSN Entered By: Redmond Pulling on 04/18/2022 13:37:26 -------------------------------------------------------------------------------- Encounter Discharge Information Details Patient Name: Date of Service: [name]. 04/18/2022 1:30 PM Medical Record Number: [id number] Patient Account Number: [id number] Date of Birth/Sex: Treating RN: 31-Mar-1950 (72 y.o. [name] Primary Care Joelee Snoke: Creola Corn Other Clinician: Referring Radhika Dershem: Treating Collen Hostler/Extender: Octavia Bruckner in Treatment: 4 Encounter  Discharge Information Items Post Procedure Vitals Discharge Condition: Stable Temperature (F): 98.3 Ambulatory Status: Wheelchair Pulse (bpm): 99 Discharge Destination: Home Respiratory Rate (breaths/min): 18 Transportation: Private Auto Blood Pressure (mmHg): 123/76 Accompanied By: husband Schedule Follow-up Appointment: Yes Clinical Summary of Care: Patient Declined Electronic Signature(s) Signed: 04/18/2022 4:01:47 PM By: Redmond Pulling RN, BSN Entered By: Redmond Pulling on 04/18/2022 15:44:06 -------------------------------------------------------------------------------- Lower Extremity Assessment Details Patient Name: Date of Service: [name]. 04/18/2022 1:30 PM Medical Record Number: [id number] Patient Account Number: [id number] Date of Birth/Sex: Treating RN: 25-Mar-1950 (72 y.o. [name] Primary Care Jameca Chumley: Creola Corn Other Clinician: Referring Tessla Spurling: Treating Sereen Schaff/Extender: Octavia Bruckner in Treatment: 4 Edema Assessment G[Left: Noralee Space ([id number])] Franne Forts: [id number] Page 2 of 6] Assessed: [Left: No] [Right: No] Edema: [Left: Ye] [Right: s] Calf Left: Right: Point of Measurement: 27 cm From Medial Instep 30.8 cm Ankle Left: Right: Point of Measurement: 9 cm From Medial Instep 25.7 cm Vascular Assessment Pulses: Dorsalis Pedis Palpable: [Left:Yes] Electronic Signature(s) Signed: 04/18/2022 4:01:47 PM By: Redmond Pulling RN, BSN Entered By: Redmond Pulling on 04/18/2022 13:49:57 -------------------------------------------------------------------------------- Multi Wound Chart Details Patient Name: Date of Service: [name]. 04/18/2022 1:30 PM Medical Record Number: [id number] Patient Account Number: [id number] Date of Birth/Sex: Treating RN: Dec 11, 1950 (72 y.o. F) Primary Care [name] Desiree: Creola Corn Other Clinician: Referring Numan Zylstra: Treating Modest Draeger/Extender: Octavia Bruckner in Treatment: 4 Vital Signs Height(in): 62 Pulse(bpm): 99 Weight(lbs): 148 Blood Pressure(mmHg): 123/76 Body Mass Index(BMI): 27.1 Temperature(F): 98.3 Respiratory Rate(breaths/min): 18 [1:Photos:] [N/A:N/A] Left, Anterior Lower Leg N/A N/A Wound Location: Surgical Injury N/A N/A Wounding Event: Dehisced Wound N/A N/A Primary Etiology: Asthma, Hypertension, Received N/A N/A Comorbid History: Chemotherapy, Received Radiation 03/01/2022 N/A N/A Date Acquired: 4 N/A N/A Weeks of Treatment: Open N/A N/A Wound Status: No N/A N/A Wound Recurrence: 3x1.7x0.1 N/A N/A Measurements L x W x D (cm) 4.006 N/A N/A A (cm) : rea  0.401 N/A N/A Volume (cm) : 75.10% N/A N/A % Reduction in Area: 75.10% N/A N/A % Reduction in Volume: Full Thickness Without Exposed N/A N/A Classification: Support Structures Medium N/A N/A Exudate A mount: Serosanguineous N/A N/A Exudate Type: red, brown N/A N/A Exudate Color: Large (67-100%) N/A N/A Granulation A mount: Red N/A N/A Granulation Quality: Small (1-33%) N/A N/A Necrotic A mount: Fat Layer (Subcutaneous Tissue): Yes N/A N/A Exposed Structures: Large (67-100%) N/A N/A EpithelializationSTEFFIE, [name] ([id number]) [id number] Page 3 of 6 Debridement - Excisional N/A N/A Debridement: 13:55 N/A N/A Pre-procedure Verification/Time Out Taken: Lidocaine 4% Topical Solution N/A N/A Pain Control: Subcutaneous, Slough N/A N/A Tissue Debrided: Skin/Subcutaneous Tissue N/A N/A Level: 5.1 N/A N/A Debridement A (sq cm): rea Curette N/A N/A Instrument: Minimum N/A N/A Bleeding: Pressure N/A N/A Hemostasis A chieved: 0 N/A N/A Procedural Pain: 0 N/A N/A Post Procedural Pain: Procedure was tolerated well N/A N/A Debridement Treatment Response: 3x1.7x0.1 N/A N/A Post Debridement Measurements L x W x D (cm) 0.401 N/A N/A Post Debridement Volume: (cm) Excoriation: No  N/A N/A Periwound Skin Texture: Induration: No Callus: No Crepitus: No Rash: No Scarring: No Maceration: No N/A N/A Periwound Skin Moisture: Dry/Scaly: No Atrophie Blanche: No N/A N/A Periwound Skin Color: Cyanosis: No Ecchymosis: No Erythema: No Hemosiderin Staining: No Mottled: No Pallor: No Rubor: No Cellular or Tissue Based Product N/A N/A Procedures Performed: Debridement Treatment Notes Electronic Signature(s) Signed: 04/18/2022 3:17:23 PM By: Geralyn Corwin DO Entered By: Geralyn Corwin on 04/18/2022 14:12:45 -------------------------------------------------------------------------------- Multi-Disciplinary Care Plan Details Patient Name: Date of Service: Miki Kins M. 04/18/2022 1:30 PM Medical Record Number: [id number] Patient Account Number: [id number] Date of Birth/Sex: Treating RN: [birth year] (72 y.o. [name] Primary Care Khyree Carillo: Creola Corn Other Clinician: Referring Signa Cheek: Treating Jerimah Witucki/Extender: Octavia Bruckner in Treatment: 4 Active Inactive Wound/Skin Impairment Nursing Diagnoses: Impaired tissue integrity Goals: Patient/caregiver will verbalize understanding of skin care regimen Date Initiated: 03/21/2022 Target Resolution Date: 07/03/2022 Goal Status: Active Interventions: Assess ulceration(s) every visit Treatment Activities: Skin care regimen initiated : 03/21/2022 Notes: Electronic Signature(s) Signed: 04/18/2022 4:01:47 PM By: Redmond Pulling RN, BSN Entered By: Redmond Pulling on 04/18/2022 13:52:04 Eric Form ([id number]) [id number] Page 4 of 6 -------------------------------------------------------------------------------- Pain Assessment Details Patient Name: Date of Service: Desiree DYMON, [place]. 04/18/2022 1:30 PM Medical Record Number: [id number] Patient Account Number: [id number] Date of Birth/Sex: Treating RN: [birth year] (72 y.o. [name] Primary Care  Jahson Emanuele: Creola Corn Other Clinician: Referring Deedra Pro: Treating Dinora Hemm/Extender: Octavia Bruckner in Treatment: 4 Active Problems Location of Pain Severity and Description of Pain Patient Has Paino No Site Locations Pain Management and Medication Current Pain Management: Electronic Signature(s) Signed: 04/18/2022 4:01:47 PM By: Redmond Pulling RN, BSN Entered By: Redmond Pulling on 04/18/2022 13:39:38 -------------------------------------------------------------------------------- Patient/Caregiver Education Details Patient Name: Date of Service: [name] 4/15/2024andnbsp1:30 PM Medical Record Number: [id number] Patient Account Number: [id number] Date of Birth/Gender: Treating RN: [birth year] (72 y.o. [name] Primary Care Physician: Creola Corn Other Clinician: Referring Physician: Treating Physician/Extender: Octavia Bruckner in Treatment: 4 Education Assessment Education Provided To: Patient Education Topics Provided Wound/Skin Impairment: Methods: Explain/Verbal Responses: State content correctly Electronic Signature(s) Signed: 04/18/2022 4:01:47 PM By: Redmond Pulling RN, BSN Entered By: Redmond Pulling on 04/18/2022 13:52:25 Eric Form ([id number]) [id number] Page 5 of 6 -------------------------------------------------------------------------------- Wound Assessment Details Patient Name: Date of Service: Desiree CHALET, KERWIN. 04/18/2022 1:30 PM Medical Record Number:  [id number] Patient Account Number: [id number] Date of Birth/Sex: Treating RN: 12-Feb-1950 (72 y.o. [name] Primary Care Breckan Cafiero: Creola Corn Other Clinician: Referring Hyacinth Marcelli: Treating Lavelle Akel/Extender: Octavia Bruckner in Treatment: 4 Wound Status Wound Number: 1 Primary Dehisced Wound Etiology: Wound Location: Left, Anterior Lower Leg Wound Status: Open Wounding Event: Surgical  Injury Notes: pt. had a Melanoma removed by Dr. Irene Limbo Date Acquired: 03/01/2022 Comorbid Asthma, Hypertension, Received Chemotherapy, Received Weeks Of Treatment: 4 History: Radiation Clustered Wound: No Photos Wound Measurements Length: (cm) 3 Width: (cm) 1.7 Depth: (cm) 0.1 Area: (cm) 4.006 Volume: (cm) 0.401 % Reduction in Area: 75.1% % Reduction in Volume: 75.1% Epithelialization: Large (67-100%) Tunneling: No Undermining: No Wound Description Classification: Full Thickness Without Exposed Support Structures Exudate Amount: Medium Exudate Type: Serosanguineous Exudate Color: red, brown Foul Odor After Cleansing: No Slough/Fibrino Yes Wound Bed Granulation Amount: Large (67-100%) Exposed Structure Granulation Quality: Red Fat Layer (Subcutaneous Tissue) Exposed: Yes Necrotic Amount: Small (1-33%) Necrotic Quality: Adherent Slough Periwound Skin Texture Texture Color No Abnormalities Noted: No No Abnormalities Noted: No Callus: No Atrophie Blanche: No Crepitus: No Cyanosis: No Excoriation: No Ecchymosis: No Induration: No Erythema: No Rash: No Hemosiderin Staining: No Scarring: No Mottled: No Pallor: No Moisture Rubor: No No Abnormalities Noted: No Dry / Scaly: No Maceration: No Treatment Notes Wound #1 (Lower Leg) Wound Laterality: Left, Anterior Cleanser Soap and Water Discharge Instruction: May shower and wash wound with dial antibacterial soap and water prior to dressing change. HAYDN, COOGAN ([id number]) [id number] Page 6 of 6 Vashe 5.8 (oz) Discharge Instruction: Cleanse the wound with Vashe prior to applying a clean dressing using gauze sponges, not tissue or cotton balls. Peri-Wound Care Sween Lotion (Moisturizing lotion) Discharge Instruction: Apply moisturizing lotion as directed Topical Primary Dressing Secondary Dressing ABD Pad, 8x10 Discharge Instruction: Apply over primary dressing as  directed. Secured With [employer] 4x5 (in/yd) Discharge Instruction: Secure with Coban as directed. Kerlix Roll Sterile, 4.5x3.1 (in/yd) Discharge Instruction: Secure with Kerlix as directed. Compression Wrap Compression Stockings Add-Ons Electronic Signature(s) Signed: 04/18/2022 4:01:47 PM By: Redmond Pulling RN, BSN Entered By: Redmond Pulling on 04/18/2022 14:01:08 -------------------------------------------------------------------------------- Vitals Details Patient Name: Date of Service: [name]. 04/18/2022 1:30 PM Medical Record Number: [id number] Patient Account Number: [id number] Date of Birth/Sex: Treating RN: Mar 03, 1950 (72 y.o. [name] Primary Care Marcquis Ridlon: Creola Corn Other Clinician: Referring Elmor Kost: Treating Marzella Miracle/Extender: Octavia Bruckner in Treatment: 4 Vital Signs Time Taken: 13:38 Temperature (F): 98.3 Height (in): 62 Pulse (bpm): 99 Weight (lbs): 148 Respiratory Rate (breaths/min): 18 Body Mass Index (BMI): 27.1 Blood Pressure (mmHg): 123/76 Reference Range: 80 - 120 mg / dl Electronic Signature(s) Signed: 04/18/2022 4:01:47 PM By: Redmond Pulling RN, BSN Entered By: Redmond Pulling on 04/18/2022 13:39:30

## 2022-04-18 NOTE — Telephone Encounter (Signed)
CALLED PATIENT TO INFORM OF FU WITH DR. Pricilla Holm ON 06-30-22- ARRIVAL TIME- 3 PM, SPOKE WITH PATIENT AND SHE IS AWARE OF THIS APPT.

## 2022-04-20 DIAGNOSIS — J3081 Allergic rhinitis due to animal (cat) (dog) hair and dander: Secondary | ICD-10-CM | POA: Diagnosis not present

## 2022-04-20 DIAGNOSIS — J3089 Other allergic rhinitis: Secondary | ICD-10-CM | POA: Diagnosis not present

## 2022-04-20 DIAGNOSIS — J301 Allergic rhinitis due to pollen: Secondary | ICD-10-CM | POA: Diagnosis not present

## 2022-04-25 ENCOUNTER — Encounter (HOSPITAL_BASED_OUTPATIENT_CLINIC_OR_DEPARTMENT_OTHER): Payer: Medicare Other | Admitting: Internal Medicine

## 2022-04-25 DIAGNOSIS — T8131XA Disruption of external operation (surgical) wound, not elsewhere classified, initial encounter: Secondary | ICD-10-CM | POA: Diagnosis not present

## 2022-04-25 DIAGNOSIS — Z8542 Personal history of malignant neoplasm of other parts of uterus: Secondary | ICD-10-CM | POA: Diagnosis not present

## 2022-04-25 DIAGNOSIS — L97822 Non-pressure chronic ulcer of other part of left lower leg with fat layer exposed: Secondary | ICD-10-CM | POA: Diagnosis not present

## 2022-04-25 DIAGNOSIS — G14 Postpolio syndrome: Secondary | ICD-10-CM | POA: Diagnosis not present

## 2022-04-25 DIAGNOSIS — Z85820 Personal history of malignant melanoma of skin: Secondary | ICD-10-CM | POA: Diagnosis not present

## 2022-04-25 DIAGNOSIS — I890 Lymphedema, not elsewhere classified: Secondary | ICD-10-CM | POA: Diagnosis not present

## 2022-04-25 NOTE — Progress Notes (Signed)
Desiree, [name] ([id number]) [id number] Page 1 of 6 Visit Report for 04/25/2022 Arrival Information Details Patient Name: Date of Service: [name], [name]. 04/25/2022 1:30 PM Medical Record Number: [id number] Patient Account Number: [id number] Date of Birth/Sex: Treating RN: 1950/08/07 (72 y.o. F) Primary Care Jacalynn Buzzell: Creola Corn Other Clinician: Referring Marqus Macphee: Treating Akai Dollard/Extender: Octavia Bruckner in Treatment: 5 Visit Information History Since Last Visit Added or deleted any medications: No Patient Arrived: Crutches Any new allergies or adverse reactions: No Arrival Time: 13:52 Had a fall or experienced change in No Accompanied By: self activities of daily living that may affect Transfer Assistance: None risk of falls: Patient Identification Verified: Yes Signs or symptoms of abuse/neglect since last visito No Secondary Verification Process Completed: Yes Hospitalized since last visit: No Patient Requires Transmission-Based Precautions: No Implantable device outside of the clinic excluding No Patient Has Alerts: No cellular tissue based products placed in the center since last visit: Has Dressing in Place as Prescribed: Yes Has Compression in Place as Prescribed: Yes Pain Present Now: No Electronic Signature(s) Signed: 04/25/2022 4:08:22 PM By: Thayer Dallas Entered By: Thayer Dallas on 04/25/2022 13:55:06 -------------------------------------------------------------------------------- Encounter Discharge Information Details Patient Name: Date of Service: [name]. 04/25/2022 1:30 PM Medical Record Number: [id number] Patient Account Number: [id number] Date of Birth/Sex: Treating RN: 09-28-1950 (72 y.o. [name], [name] Primary Care [name]: Creola Corn Other Clinician: Referring David Rodriquez: Treating Sulaiman Imbert/Extender: Octavia Bruckner in Treatment: 5 Encounter Discharge Information Items  Post Procedure Vitals Discharge Condition: Stable Temperature (F): 97.7 Ambulatory Status: Crutches Pulse (bpm): 74 Discharge Destination: Home Respiratory Rate (breaths/min): 18 Transportation: Private Auto Blood Pressure (mmHg): 133/84 Accompanied By: self Schedule Follow-up Appointment: Yes Clinical Summary of Care: Electronic Signature(s) Signed: 04/25/2022 4:27:44 PM By: Shawn Stall RN, BSN Entered By: Shawn [name] on 04/25/2022 14:40:06 -------------------------------------------------------------------------------- Lower Extremity Assessment Details Patient Name: Date of Service: [name]. 04/25/2022 1:30 PM Medical Record Number: [id number] Patient Account Number: [id number] Date of Birth/Sex: Treating RN: [birth year] (72 y.o. F) Primary Care Bengie Kaucher: Creola Corn Other Clinician: Referring [name]: Treating [name]/Extender: Octavia Bruckner in Treatment: 5 Edema Assessment G[Left: [name] ([id number])] [name]: [id number] Page 2 of 6] Assessed: [Left: No] [Right: No] Edema: [Left: Ye] [Right: s] Calf Left: Right: Point of Measurement: 27 cm From Medial Instep 32 cm Ankle Left: Right: Point of Measurement: 9 cm From Medial Instep 24.4 cm Electronic Signature(s) Signed: 04/25/2022 4:08:22 PM By: Thayer Dallas Entered By: Thayer Dallas on 04/25/2022 14:13:41 -------------------------------------------------------------------------------- Multi Wound Chart Details Patient Name: Date of Service: [name]. 04/25/2022 1:30 PM Medical Record Number: [id number] Patient Account Number: [id number] Date of Birth/Sex: Treating RN: 03/29/1950 (72 y.o. F) Primary Care Collins Kerby: Creola Corn Other Clinician: Referring [name]: Treating [name]/Extender: Octavia Bruckner in Treatment: 5 Vital Signs Height(in): 62 Pulse(bpm): 74 Weight(lbs): 148 Blood Pressure(mmHg): 133/84 Body Mass  Index(BMI): 27.1 Temperature(F): 97.7 Respiratory Rate(breaths/min): 18 [1:Photos:] [N/A:N/A] Left, Anterior Lower Leg N/A N/A Wound Location: Surgical Injury N/A N/A Wounding Event: Dehisced Wound N/A N/A Primary Etiology: Asthma, Hypertension, Received N/A N/A Comorbid History: Chemotherapy, Received Radiation 03/01/2022 N/A N/A Date Acquired: 5 N/A N/A Weeks of Treatment: Open N/A N/A Wound Status: No N/A N/A Wound Recurrence: 3x2x0.1 N/A N/A Measurements L x W x D (cm) 4.712 N/A N/A A (cm) : rea 0.471 N/A N/A Volume (cm) : 70.80% N/A N/A % Reduction in Area: 70.80% N/A N/A % Reduction  in Volume: Full Thickness Without Exposed N/A N/A Classification: Support Structures Medium N/A N/A Exudate Amount: Serosanguineous N/A N/A Exudate Type: red, brown N/A N/A Exudate Color: Distinct, outline attached N/A N/A Wound Margin: Large (67-100%) N/A N/A Granulation Amount: Red, Pink N/A N/A Granulation Quality: Small (1-33%) N/A N/A Necrotic Amount: Fat Layer (Subcutaneous Tissue): Yes N/A N/A Exposed Structures: Fascia: No Tendon: No Muscle: No Joint: No Bone: No Large (67-100%) N/A N/A Epithelialization: Debridement - Excisional N/A N/A Debridement: Desiree, [name] ([id number]) [id number] Page 3 of 6 14:25 N/A N/A Pre-procedure Verification/Time Out Taken: Lidocaine 4% Topical Solution N/A N/A Pain Control: Subcutaneous, Slough N/A N/A Tissue Debrided: Skin/Subcutaneous Tissue N/A N/A Level: 4.71 N/A N/A Debridement A (sq cm): rea Curette N/A N/A Instrument: Large N/A N/A Bleeding: Pressure N/A N/A Hemostasis A chieved: 0 N/A N/A Procedural Pain: 0 N/A N/A Post Procedural Pain: Procedure was tolerated well N/A N/A Debridement Treatment Response: 3x2x0.1 N/A N/A Post Debridement Measurements L x W x D (cm) 0.471 N/A N/A Post Debridement Volume: (cm) Excoriation: No N/A N/A Periwound Skin  Texture: Induration: No Callus: No Crepitus: No Rash: No Scarring: No Maceration: No N/A N/A Periwound Skin Moisture: Dry/Scaly: No Atrophie Blanche: No N/A N/A Periwound Skin Color: Cyanosis: No Ecchymosis: No Erythema: No Hemosiderin Staining: No Mottled: No Pallor: No Rubor: No Cellular or Tissue Based Product N/A N/A Procedures Performed: Debridement Treatment Notes Electronic Signature(s) Signed: 04/25/2022 3:35:10 PM By: Geralyn Corwin DO Entered By: Geralyn Corwin on 04/25/2022 14:38:17 -------------------------------------------------------------------------------- Multi-Disciplinary Care Plan Details Patient Name: Date of Service: [name]. 04/25/2022 1:30 PM Medical Record Number: [id number] Patient Account Number: [id number] Date of Birth/Sex: Treating RN: [birth year] (72 y.o. [name] Primary Care Levell Tavano: Creola Corn Other Clinician: Referring Angelicia Lessner: Treating Minta Fair/Extender: Octavia Bruckner in Treatment: 5 Active Inactive Wound/Skin Impairment Nursing Diagnoses: Impaired tissue integrity Goals: Patient/caregiver will verbalize understanding of skin care regimen Date Initiated: 03/21/2022 Target Resolution Date: 07/03/2022 Goal Status: Active Interventions: Assess ulceration(s) every visit Treatment Activities: Skin care regimen initiated : 03/21/2022 Notes: Electronic Signature(s) Signed: 04/25/2022 4:27:44 PM By: Shawn Stall RN, BSN Entered By: Shawn [name] on 04/25/2022 14:00:58 Eric Form ([id number]) [id number] Page 4 of 6 -------------------------------------------------------------------------------- Pain Assessment Details Patient Name: Date of Service: LEANER, MORICI. 04/25/2022 1:30 PM Medical Record Number: [id number] Patient Account Number: [id number] Date of Birth/Sex: Treating RN: [birth year] (72 y.o. F) Primary Care Marianela Mandrell: Creola Corn Other  Clinician: Referring Shronda Boeh: Treating Retha Bither/Extender: Octavia Bruckner in Treatment: 5 Active Problems Location of Pain Severity and Description of Pain Patient Has Paino No Site Locations Pain Management and Medication Current Pain Management: Electronic Signature(s) Signed: 04/25/2022 4:08:22 PM By: Thayer Dallas Entered By: Thayer Dallas on 04/25/2022 13:55:34 -------------------------------------------------------------------------------- Patient/Caregiver Education Details Patient Name: Date of Service: [name] 4/22/2024andnbsp1:30 PM Medical Record Number: [id number] Patient Account Number: [id number] Date of Birth/Gender: Treating RN: 1950-08-23 (72 y.o. [name] Primary Care Physician: Creola Corn Other Clinician: Referring Physician: Treating Physician/Extender: Octavia Bruckner in Treatment: 5 Education Assessment Education Provided To: Patient Education Topics Provided Wound/Skin Impairment: Handouts: Caring for Your Ulcer Methods: Explain/Verbal Responses: Reinforcements needed Electronic Signature(s) Signed: 04/25/2022 4:27:44 PM By: Shawn Stall RN, BSN Entered By: Shawn [name] on 04/25/2022 14:01:15 Eric Form ([id number]) [id number] Page 5 of 6 -------------------------------------------------------------------------------- Wound Assessment Details Patient Name: Date of Service: ADAIR, LEMAR. 04/25/2022 1:30 PM Medical Record Number: [id number] Patient Account  Number: [id number] Date of Birth/Sex: Treating RN: 1950-08-14 (72 y.o. [name], Millard.Loa Primary Care Rakhi Romagnoli: Creola Corn Other Clinician: Referring Brentyn Seehafer: Treating Arely Tinner/Extender: Octavia Bruckner in Treatment: 5 Wound Status Wound Number: 1 Primary Dehisced Wound Etiology: Wound Location: Left, Anterior Lower Leg Wound Status: Open Wounding Event: Surgical Injury Notes: pt.  had a Melanoma removed by Dr. Irene Limbo Date Acquired: 03/01/2022 Comorbid Asthma, Hypertension, Received Chemotherapy, Received Weeks Of Treatment: 5 History: Radiation Clustered Wound: No Photos Wound Measurements Length: (cm) 3 Width: (cm) 2 Depth: (cm) 0.1 Area: (cm) 4.712 Volume: (cm) 0.471 % Reduction in Area: 70.8% % Reduction in Volume: 70.8% Epithelialization: Large (67-100%) Tunneling: No Undermining: No Wound Description Classification: Full Thickness Without Exposed Suppor Wound Margin: Distinct, outline attached Exudate Amount: Medium Exudate Type: Serosanguineous Exudate Color: red, brown t Structures Foul Odor After Cleansing: No Slough/Fibrino Yes Wound Bed Granulation Amount: Large (67-100%) Exposed Structure Granulation Quality: Red, Pink Fascia Exposed: No Necrotic Amount: Small (1-33%) Fat Layer (Subcutaneous Tissue) Exposed: Yes Necrotic Quality: Adherent Slough Tendon Exposed: No Muscle Exposed: No Joint Exposed: No Bone Exposed: No Periwound Skin Texture Texture Color No Abnormalities Noted: No No Abnormalities Noted: No Callus: No Atrophie Blanche: No Crepitus: No Cyanosis: No Excoriation: No Ecchymosis: No Induration: No Erythema: No Rash: No Hemosiderin Staining: No Scarring: No Mottled: No Pallor: No Moisture Rubor: No No Abnormalities Noted: No Dry / Scaly: No Maceration: No Treatment Notes Wound #1 (Lower Leg) Wound Laterality: Left, Anterior DARNISHA, VERNET ([id number]) [id number] Page 6 of 6 Cleanser Soap and Water Discharge Instruction: May shower and wash wound with dial antibacterial soap and water prior to dressing change. Vashe 5.8 (oz) Discharge Instruction: Cleanse the wound with Vashe prior to applying a clean dressing using gauze sponges, not tissue or cotton balls. Peri-Wound Care Sween Lotion (Moisturizing lotion) Discharge Instruction: Apply moisturizing lotion as  directed Topical Primary Dressing grafix #1 Discharge Instruction: applied by Elyssa Pendelton adaptic and steristrips Discharge Instruction: secured the grafix in place. Secondary Dressing ABD Pad, 8x10 Discharge Instruction: Apply over primary dressing as directed. Secured With [employer] 4x5 (in/yd) Discharge Instruction: Secure with Coban as directed. Kerlix Roll Sterile, 4.5x3.1 (in/yd) Discharge Instruction: Secure with Kerlix as directed. Compression Wrap Compression Stockings Add-Ons Electronic Signature(s) Signed: 04/25/2022 4:27:44 PM By: Shawn Stall RN, BSN Entered By: Shawn [name] on 04/25/2022 14:31:24 -------------------------------------------------------------------------------- Vitals Details Patient Name: Date of Service: [name]. 04/25/2022 1:30 PM Medical Record Number: [id number] Patient Account Number: [id number] Date of Birth/Sex: Treating RN: 09/05/1950 (72 y.o. F) Primary Care Kylor Valverde: Creola Corn Other Clinician: Referring Vernon Maish: Treating Elonzo Sopp/Extender: Octavia Bruckner in Treatment: 5 Vital Signs Time Taken: 13:55 Temperature (F): 97.7 Height (in): 62 Pulse (bpm): 74 Weight (lbs): 148 Respiratory Rate (breaths/min): 18 Body Mass Index (BMI): 27.1 Blood Pressure (mmHg): 133/84 Reference Range: 80 - 120 mg / dl Electronic Signature(s) Signed: 04/25/2022 4:08:22 PM By: Thayer Dallas Entered By: Thayer Dallas on 04/25/2022 13:55:27

## 2022-04-25 NOTE — Progress Notes (Signed)
AYMARA, SASSI ([id number]) [id number] Page 1 of 9 Visit Report for 04/25/2022 Chief Complaint Document Details Patient Name: Date of Service: [name], [name]. 04/25/2022 1:30 PM Medical Record Number: [id number] Patient Account Number: [id number] Date of Birth/Sex: Treating RN: [birth year] (71 y.o. F) Primary Care Provider: Creola Corn Other Clinician: Referring Provider: Treating Provider/Extender: Octavia Bruckner in Treatment: 5 Information Obtained from: Patient Chief Complaint 03/21/2022; left lower extremity wound Electronic Signature(s) Signed: 04/25/2022 3:35:10 PM By: Geralyn Corwin DO Entered By: Geralyn Corwin on 04/25/2022 14:38:28 -------------------------------------------------------------------------------- Cellular or Tissue Based Product Details Patient Name: Date of Service: Desiree David Stall. 04/25/2022 1:30 PM Medical Record Number: [id number] Patient Account Number: [id number] Date of Birth/Sex: Treating RN: [birth year] (71 y.o. [name], [name] Primary Care Provider: Creola Corn Other Clinician: Referring Provider: Treating Provider/Extender: Octavia Bruckner in Treatment: 5 Cellular or Tissue Based Product Type Wound #1 Left,Anterior Lower Leg Applied to: Performed By: Physician Geralyn Corwin, DO Cellular or Tissue Based Product Type: Grafix prime Level of Consciousness (Pre-procedure): Awake and Alert Pre-procedure Verification/Time Out Yes - 14:35 Taken: Location: trunk / arms / legs Wound Size (sq cm): 6 Product Size (sq cm): 6 Waste Size (sq cm): 0 Amount of Product Applied (sq cm): 6 Instrument Used: Forceps, Scissors Lot #: [id number] Order #: 1 Expiration Date: 04/11/2023 Fenestrated: No Reconstituted: Yes Solution Type: normal saline Solution Amount: 3mL Lot #: [id number] Solution Expiration Date: 06/02/2024 Secured: Yes Secured With: Steri-Strips, adaptic Dressing Applied:  No Procedural Pain: 0 Post Procedural Pain: 0 Response to Treatment: Procedure was tolerated well Level of Consciousness (Post- Awake and Alert procedure): Post Procedure Diagnosis Same as Pre-procedure Electronic Signature(s) Signed: 04/25/2022 3:35:10 PM By: Geralyn Corwin DO Signed: 04/25/2022 4:27:44 PM By: Shawn Stall RN, BSN Entered By: Shawn Stall on 04/25/2022 14:37:20 Eric Form ([id number]) [id number] Page 2 of 9 -------------------------------------------------------------------------------- Debridement Details Patient Name: Date of Service: Desiree ANASTASIYA, GOWIN. 04/25/2022 1:30 PM Medical Record Number: [id number] Patient Account Number: [id number] Date of Birth/Sex: Treating RN: [birth year] (72 y.o. [name], [name] Primary Care Provider: Creola Corn Other Clinician: Referring Provider: Treating Provider/Extender: Octavia Bruckner in Treatment: 5 Debridement Performed for Assessment: Wound #1 Left,Anterior Lower Leg Performed By: Physician Geralyn Corwin, DO Debridement Type: Debridement Level of Consciousness (Pre-procedure): Awake and Alert Pre-procedure Verification/Time Out Yes - 14:25 Taken: Start Time: 14:26 Pain Control: Lidocaine 4% T opical Solution Percent of Wound Bed Debrided: 100% T Area Debrided (cm): otal 4.71 Tissue and other material debrided: Viable, Non-Viable, Slough, Subcutaneous, Slough Level: Skin/Subcutaneous Tissue Debridement Description: Excisional Instrument: Curette Bleeding: Large Hemostasis Achieved: Pressure End Time: 14:35 Procedural Pain: 0 Post Procedural Pain: 0 Response to Treatment: Procedure was tolerated well Level of Consciousness (Post- Awake and Alert procedure): Post Debridement Measurements of Total Wound Length: (cm) 3 Width: (cm) 2 Depth: (cm) 0.1 Volume: (cm) 0.471 Character of Wound/Ulcer Post Debridement: Improved Post Procedure Diagnosis Same as  Pre-procedure Electronic Signature(s) Signed: 04/25/2022 3:35:10 PM By: Geralyn Corwin DO Signed: 04/25/2022 4:27:44 PM By: Shawn Stall RN, BSN Entered By: Shawn Stall on 04/25/2022 14:35:18 -------------------------------------------------------------------------------- HPI Details Patient Name: Date of Service: Desiree Congress. 04/25/2022 1:30 PM Medical Record Number: [id number] Patient Account Number: [id number] Date of Birth/Sex: Treating RN: 08-17-1950 (72 y.o. F) Primary Care Provider: Creola Corn Other Clinician: Referring Provider: Treating Provider/Extender: Octavia Bruckner in Treatment: 5 History of Present Illness HPI Description: 03/21/2022 [name]  is a 72 year old female with a past medical history of postpolio syndrome, endometrial cancer and essential hypertension that presents the clinic for a 1 month history of nonhealing ulcer to the left lower extremity. She states that she had a melanoma removed from the left lower extremity on 2/27 by Dr. Irene Limbo. After the surgery the wound dehisced. They have been using compression wraps. It is unclear what dressing has been used. She has no issues or complaints today. She denies signs of infection. 3/25; Patient presents for follow-up. We have been using Santyl and Hydrofera Blue under Kerlix/Coban. There is been improvement in wound healing. She had no issues with the compression wrap. 4/8; patient presents for follow-up. We have been using Santyl Hydrofera Blue under Kerlix/Coban. Wound continues to improve in size and appearance. She still has slough buildup. I think in the near future she would do well with a skin substitute. We discussed this and patient was agreeable to proceed with insurance verification. We will Desiree ahead and run her for PuraPly and Grafix. [name], [name] ([id number]) [id number] Page 3 of 9 4/15; patient presents for follow-up. We have been using  Santyl and Hydrofera Blue under Kerlix/Coban. The wound is smaller. She has been approved for PuraPly and she is agreeable to proceed with placement today. She denies signs of infection. 4/22; patient presents for follow-up. We placed PuraPly on the wound bed at last clinic visit under Kerlix/Coban. Unfortunately the PuraPly dried out. She denies signs of infection. Electronic Signature(s) Signed: 04/25/2022 3:35:10 PM By: Geralyn Corwin DO Entered By: Geralyn Corwin on 04/25/2022 14:39:05 -------------------------------------------------------------------------------- Physical Exam Details Patient Name: Date of Service: Desiree Congress. 04/25/2022 1:30 PM Medical Record Number: [id number] Patient Account Number: [id number] Date of Birth/Sex: Treating RN: 08/10/1950 (72 y.o. F) Primary Care Provider: Creola Corn Other Clinician: Referring Provider: Treating Provider/Extender: Octavia Bruckner in Treatment: 5 Constitutional respirations regular, non-labored and within target range for patient.. Cardiovascular 2+ dorsalis pedis/posterior tibialis pulses. Psychiatric pleasant and cooperative. Notes Left lower extremity: Open wound to the anterior aspect with granulation tissue and nonviable tissue. No signs of surrounding infection. Electronic Signature(s) Signed: 04/25/2022 3:35:10 PM By: Geralyn Corwin DO Entered By: Geralyn Corwin on 04/25/2022 14:39:40 -------------------------------------------------------------------------------- Physician Orders Details Patient Name: Date of Service: Desiree Congress. 04/25/2022 1:30 PM Medical Record Number: [id number] Patient Account Number: [id number] Date of Birth/Sex: Treating RN: [birth year] (72 y.o. [name] Primary Care Provider: Creola Corn Other Clinician: Referring Provider: Treating Provider/Extender: Octavia Bruckner in Treatment: 5 Verbal / Phone Orders: No Diagnosis  Coding ICD-10 Coding Code Description [phone] Non-pressure chronic ulcer of other part of left lower leg with fat layer exposed T81.31XA Disruption of external operation (surgical) wound, not elsewhere classified, initial encounter C43.9 Malignant melanoma of skin, unspecified G14 Postpolio syndrome C54.1 Malignant neoplasm of endometrium I89.0 Lymphedema, not elsewhere classified Follow-up Appointments ppointment in 1 week. - Dr. Mikey Bussing 05/02/2022 130pm Return A ppointment in 2 weeks. - Dr. Mikey Bussing 05/09/2022 130pm Return A Anesthetic (In clinic) Topical Lidocaine 4% applied to wound bed - used in clinic Cellular or Tissue Based Products Wound #1 Left,Anterior Lower Leg [name], [name] ([id number]) [id number] Page 4 of 9 Cellular or Tissue Based Product Type: - Puraply AM #1 applied 04/18/22 CHANGED TO Grafix 2x3 applied today 04/25/2022 Cellular or Tissue Based Product applied to wound bed, secured with steri-strips, cover with Adaptic or Mepitel. (DO NOT REMOVE). Bathing/ Shower/ Hygiene May shower with protection but  do not get wound dressing(s) wet. Protect dressing(s) with water repellant cover (for example, large plastic bag) or a cast cover and may then take shower. Edema Control - Lymphedema / SCD / Other Elevate legs to the level of the heart or above for 30 minutes daily and/or when sitting for 3-4 times a day throughout the day. Avoid standing for long periods of time. Exercise regularly - Walk/exercise a s tolerated throughout the day Moisturize legs daily. Wound Treatment Wound #1 - Lower Leg Wound Laterality: Left, Anterior Cleanser: Soap and Water 1 x Per Week/30 Days Discharge Instructions: May shower and wash wound with dial antibacterial soap and water prior to dressing change. Cleanser: Vashe 5.8 (oz) 1 x Per Week/30 Days Discharge Instructions: Cleanse the wound with Vashe prior to applying a clean dressing using gauze sponges, not tissue  or cotton balls. Peri-Wound Care: Sween Lotion (Moisturizing lotion) 1 x Per Week/30 Days Discharge Instructions: Apply moisturizing lotion as directed Prim Dressing: grafix #1 1 x Per Week/30 Days ary Discharge Instructions: applied by provider Prim Dressing: adaptic and steristrips 1 x Per Week/30 Days ary Discharge Instructions: secured the grafix in place. Secondary Dressing: ABD Pad, 8x10 1 x Per Week/30 Days Discharge Instructions: Apply over primary dressing as directed. Secured With: Coban Self-Adherent Wrap 4x5 (in/yd) 1 x Per Week/30 Days Discharge Instructions: Secure with Coban as directed. Secured With: [employer], 4.5x3.1 (in/yd) 1 x Per Week/30 Days Discharge Instructions: Secure with Kerlix as directed. Electronic Signature(s) Signed: 04/25/2022 3:35:10 PM By: Geralyn Corwin DO Entered By: Geralyn Corwin on 04/25/2022 14:39:47 -------------------------------------------------------------------------------- Problem List Details Patient Name: Date of Service: Desiree Congress. 04/25/2022 1:30 PM Medical Record Number: [id number] Patient Account Number: [id number] Date of Birth/Sex: Treating RN: 1950-03-16 (72 y.o. [name], [name] Primary Care Provider: Creola Corn Other Clinician: Referring Provider: Treating Provider/Extender: Octavia Bruckner in Treatment: 5 Active Problems ICD-10 Encounter Code Description Active Date MDM Diagnosis [phone] Non-pressure chronic ulcer of other part of left lower leg with fat layer exposed3/18/2024 No Yes T81.31XA Disruption of external operation (surgical) wound, not elsewhere classified, 03/21/2022 No Yes initial encounter C43.9 Malignant melanoma of skin, unspecified 03/21/2022 No Yes [name], [name] ([id number]) [phone].pdf Page 5 of 9 G14 Postpolio syndrome 03/21/2022 No Yes C54.1 Malignant neoplasm of endometrium 03/21/2022 No Yes I89.0 Lymphedema, not elsewhere classified  03/21/2022 No Yes Inactive Problems Resolved Problems Electronic Signature(s) Signed: 04/25/2022 3:35:10 PM By: Geralyn Corwin DO Entered By: Geralyn Corwin on 04/25/2022 14:38:08 -------------------------------------------------------------------------------- Progress Note Details Patient Name: Date of Service: Desiree Congress. 04/25/2022 1:30 PM Medical Record Number: [id number] Patient Account Number: [id number] Date of Birth/Sex: Treating RN: Apr 03, 1950 (72 y.o. F) Primary Care Provider: Creola Corn Other Clinician: Referring Provider: Treating Provider/Extender: Octavia Bruckner in Treatment: 5 Subjective Chief Complaint Information obtained from Patient 03/21/2022; left lower extremity wound History of Present Illness (HPI) 03/21/2022 Ms. Liz Pinho is a 72 year old female with a past medical history of postpolio syndrome, endometrial cancer and essential hypertension that presents the clinic for a 1 month history of nonhealing ulcer to the left lower extremity. She states that she had a melanoma removed from the left lower extremity on 2/27 by Dr. Irene Limbo. After the surgery the wound dehisced. They have been using compression wraps. It is unclear what dressing has been used. She has no issues or complaints today. She denies signs of infection. 3/25; Patient presents for follow-up. We have been using Santyl and Hydrofera  Blue under Kerlix/Coban. There is been improvement in wound healing. She had no issues with the compression wrap. 4/8; patient presents for follow-up. We have been using Santyl Hydrofera Blue under Kerlix/Coban. Wound continues to improve in size and appearance. She still has slough buildup. I think in the near future she would do well with a skin substitute. We discussed this and patient was agreeable to proceed with insurance verification. We will Desiree ahead and run her for PuraPly and Grafix. 4/15; patient presents for follow-up. We have been  using Santyl and Hydrofera Blue under Kerlix/Coban. The wound is smaller. She has been approved for PuraPly and she is agreeable to proceed with placement today. She denies signs of infection. 4/22; patient presents for follow-up. We placed PuraPly on the wound bed at last clinic visit under Kerlix/Coban. Unfortunately the PuraPly dried out. She denies signs of infection. Patient History Information obtained from Patient. Family History Cancer - Mother,Father,Siblings,Child,Paternal Grandparents,Maternal Grandparents, Heart Disease - Mother. Social History Never smoker, Marital Status - Married, Alcohol Use - Rarely, Drug Use - No History, Caffeine Use - Never. Medical History Respiratory Patient has history of Asthma Cardiovascular Patient has history of Hypertension - Unspecified essential HTN Oncologic Patient has history of Received Chemotherapy, Received Radiation - 05/06/19-06/04/19 Vaginal Brachytherapy (endometrialcancer) Hospitalization/Surgery History - Robotic assisted total hysterectomy; Right T hip. otal Medical A Surgical History Notes nd Cardiovascular [name], [name] ([id number]) [id number] Page 6 of 9 hyperlipidemia Musculoskeletal Hx: Polio -Left leg wears a brace Neurologic WU:JWJXB Objective Constitutional respirations regular, non-labored and within target range for patient.. Vitals Time Taken: 1:55 PM, Height: 62 in, Weight: 148 lbs, BMI: 27.1, Temperature: 97.7 F, Pulse: 74 bpm, Respiratory Rate: 18 breaths/min, Blood Pressure: 133/84 mmHg. Cardiovascular 2+ dorsalis pedis/posterior tibialis pulses. Psychiatric pleasant and cooperative. General Notes: Left lower extremity: Open wound to the anterior aspect with granulation tissue and nonviable tissue. No signs of surrounding infection. Integumentary (Hair, Skin) Wound #1 status is Open. Original cause of wound was Surgical Injury. The date acquired was: 03/01/2022. The wound has  been in treatment 5 weeks. The wound is located on the Left,Anterior Lower Leg. The wound measures 3cm length x 2cm width x 0.1cm depth; 4.712cm^2 area and 0.471cm^3 volume. There is Fat Layer (Subcutaneous Tissue) exposed. There is no tunneling or undermining noted. There is a medium amount of serosanguineous drainage noted. The wound margin is distinct with the outline attached to the wound base. There is large (67-100%) red, pink granulation within the wound bed. There is a small (1- 33%) amount of necrotic tissue within the wound bed including Adherent Slough. The periwound skin appearance did not exhibit: Callus, Crepitus, Excoriation, Induration, Rash, Scarring, Dry/Scaly, Maceration, Atrophie Blanche, Cyanosis, Ecchymosis, Hemosiderin Staining, Mottled, Pallor, Rubor, Erythema. Assessment Active Problems ICD-10 Non-pressure chronic ulcer of other part of left lower leg with fat layer exposed Disruption of external operation (surgical) wound, not elsewhere classified, initial encounter Malignant melanoma of skin, unspecified Postpolio syndrome Malignant neoplasm of endometrium Lymphedema, not elsewhere classified Patient's wound is stable. Unfortunately PuraPly dried out however postdebridement there was healthy granulation tissue present. She was also approved for Grafix and this was placed in standard fashion today. Continue Kerlix/Coban. Follow-up in 1 week. Procedures Wound #1 Pre-procedure diagnosis of Wound #1 is a Dehisced Wound located on the Left,Anterior Lower Leg . There was a Excisional Skin/Subcutaneous Tissue Debridement with a total area of 4.71 sq cm performed by Geralyn Corwin, DO. With the following instrument(s): Curette to remove Viable and Non-Viable  tissue/material. Material removed includes Subcutaneous Tissue and Slough and after achieving pain control using Lidocaine 4% T opical Solution. A time out was conducted at 14:25, prior to the start of the procedure. A  Large amount of bleeding was controlled with Pressure. The procedure was tolerated well with a pain level of 0 throughout and a pain level of 0 following the procedure. Post Debridement Measurements: 3cm length x 2cm width x 0.1cm depth; 0.471cm^3 volume. Character of Wound/Ulcer Post Debridement is improved. Post procedure Diagnosis Wound #1: Same as Pre-Procedure Pre-procedure diagnosis of Wound #1 is a Dehisced Wound located on the Left,Anterior Lower Leg. A skin graft procedure using a bioengineered skin substitute/cellular or tissue based product was performed by Geralyn Corwin, DO with the following instrument(s): Forceps and Scissors. Grafix prime was applied and secured with Steri-Strips and adaptic. 6 sq cm of product was utilized and 0 sq cm was wasted. Post Application, no dressing was applied. A Time Out was conducted at 14:35, prior to the start of the procedure. The procedure was tolerated well with a pain level of 0 throughout and a pain level of 0 following the procedure. Post procedure Diagnosis Wound #1: Same as Pre-Procedure . [name], [name] ([id number]) [id number] Page 7 of 9 Plan Follow-up Appointments: Return Appointment in 1 week. - Dr. Mikey Bussing 05/02/2022 130pm Return Appointment in 2 weeks. - Dr. Mikey Bussing 05/09/2022 130pm Anesthetic: (In clinic) Topical Lidocaine 4% applied to wound bed - used in clinic Cellular or Tissue Based Products: Wound #1 Left,Anterior Lower Leg: Cellular or Tissue Based Product Type: - Puraply AM #1 applied 04/18/22 CHANGED TO Grafix 2x3 applied today 04/25/2022 Cellular or Tissue Based Product applied to wound bed, secured with steri-strips, cover with Adaptic or Mepitel. (DO NOT REMOVE). Bathing/ Shower/ Hygiene: May shower with protection but do not get wound dressing(s) wet. Protect dressing(s) with water repellant cover (for example, large plastic bag) or a cast cover and may then take shower. Edema Control -  Lymphedema / SCD / Other: Elevate legs to the level of the heart or above for 30 minutes daily and/or when sitting for 3-4 times a day throughout the day. Avoid standing for long periods of time. Exercise regularly - Walk/exercise a s tolerated throughout the day Moisturize legs daily. WOUND #1: - Lower Leg Wound Laterality: Left, Anterior Cleanser: Soap and Water 1 x Per Week/30 Days Discharge Instructions: May shower and wash wound with dial antibacterial soap and water prior to dressing change. Cleanser: Vashe 5.8 (oz) 1 x Per Week/30 Days Discharge Instructions: Cleanse the wound with Vashe prior to applying a clean dressing using gauze sponges, not tissue or cotton balls. Peri-Wound Care: Sween Lotion (Moisturizing lotion) 1 x Per Week/30 Days Discharge Instructions: Apply moisturizing lotion as directed Prim Dressing: grafix #1 1 x Per Week/30 Days ary Discharge Instructions: applied by provider Prim Dressing: adaptic and steristrips 1 x Per Week/30 Days ary Discharge Instructions: secured the grafix in place. Secondary Dressing: ABD Pad, 8x10 1 x Per Week/30 Days Discharge Instructions: Apply over primary dressing as directed. Secured With: Coban Self-Adherent Wrap 4x5 (in/yd) 1 x Per Week/30 Days Discharge Instructions: Secure with Coban as directed. Secured With: [employer], 4.5x3.1 (in/yd) 1 x Per Week/30 Days Discharge Instructions: Secure with Kerlix as directed. 1. In office sharp debridement 2. Grafix placed in standard fashion 3. Kerlix/Coban 4. Follow-up in 1 week Electronic Signature(s) Signed: 04/25/2022 3:35:10 PM By: Geralyn Corwin DO Entered By: Geralyn Corwin on 04/25/2022 14:42:46 -------------------------------------------------------------------------------- HxROS Details Patient  Name: Date of Service: [name], [name]. 04/25/2022 1:30 PM Medical Record Number: [id number] Patient Account Number: [id number] Date of Birth/Sex: Treating  RN: 1950-09-09 (72 y.o. F) Primary Care Provider: Creola Corn Other Clinician: Referring Provider: Treating Provider/Extender: Octavia Bruckner in Treatment: 5 Information Obtained From Patient Respiratory Medical History: Positive for: Asthma Cardiovascular Medical History: Positive for: Hypertension - Unspecified essential HTN Past Medical History Notes: hyperlipidemia Musculoskeletal Medical History: Past Medical History NotesMarland Kitchen [name], [name] ([id number]) [id number] Page 8 of 9 Hx: Polio -Left leg wears a brace Neurologic Medical History: Past Medical History Notes: NW:GNFAO Oncologic Medical History: Positive for: Received Chemotherapy; Received Radiation - 05/06/19-06/04/19 Vaginal Brachytherapy (endometrialcancer) Immunizations Pneumococcal Vaccine: Received Pneumococcal Vaccination: Yes Received Pneumococcal Vaccination On or After 60th Birthday: Yes Implantable Devices Yes Hospitalization / Surgery History Type of Hospitalization/Surgery Robotic assisted total hysterectomy; Right T hip otal Family and Social History Cancer: Yes - Mother,Father,Siblings,Child,Paternal Grandparents,Maternal Grandparents; Heart Disease: Yes - Mother; Never smoker; Marital Status - Married; Alcohol Use: Rarely; Drug Use: No History; Caffeine Use: Never; Financial Concerns: No; Food, Clothing or Shelter Needs: No; Support System Lacking: No; Transportation Concerns: No Electronic Signature(s) Signed: 04/25/2022 3:35:10 PM By: Geralyn Corwin DO Entered By: Geralyn Corwin on 04/25/2022 14:39:16 -------------------------------------------------------------------------------- SuperBill Details Patient Name: Date of Service: Desiree Congress. 04/25/2022 Medical Record Number: [id number] Patient Account Number: [id number] Date of Birth/Sex: Treating RN: [birth year] (72 y.o. [name] Primary Care Provider: Creola Corn Other  Clinician: Referring Provider: Treating Provider/Extender: Octavia Bruckner in Treatment: 5 Diagnosis Coding ICD-10 Codes Code Description [phone] Non-pressure chronic ulcer of other part of left lower leg with fat layer exposed T81.31XA Disruption of external operation (surgical) wound, not elsewhere classified, initial encounter C43.9 Malignant melanoma of skin, unspecified G14 Postpolio syndrome C54.1 Malignant neoplasm of endometrium I89.0 Lymphedema, not elsewhere classified Facility Procedures : CPT4 Code: [id number] Description: Q4133- Grafix PL 2X3 sq cm (6 units) Modifier: Quantity: 6 : CPT4 Code: [id number] Description: 15271 - SKIN SUB GRAFT TRNK/ARM/LEG ICD-10 Diagnosis Description L97.822 Non-pressure chronic ulcer of other part of left lower leg with fat layer expos Modifier: ed Quantity: 1 Physician Procedures : CPT4 Code Description Modifier [id number] 15271 - WC PHYS SKIN SUB GRAFT TRNK/ARM/LEG ICD-10 Diagnosis Description [name], PARADA ([id number]) [id number] ICD-10 Diagnosis Description L97.822 Non-pressure chronic ulcer of other part  of left lower leg with fat layer exposed Quantity: 1 27.pdf Page 9 of 9 Electronic Signature(s) Signed: 04/25/2022 3:35:10 PM By: Geralyn Corwin DO Entered By: Geralyn Corwin on 04/25/2022 14:43:00

## 2022-04-29 DIAGNOSIS — J3089 Other allergic rhinitis: Secondary | ICD-10-CM | POA: Diagnosis not present

## 2022-04-29 DIAGNOSIS — J301 Allergic rhinitis due to pollen: Secondary | ICD-10-CM | POA: Diagnosis not present

## 2022-04-29 DIAGNOSIS — J3081 Allergic rhinitis due to animal (cat) (dog) hair and dander: Secondary | ICD-10-CM | POA: Diagnosis not present

## 2022-05-02 ENCOUNTER — Encounter (HOSPITAL_BASED_OUTPATIENT_CLINIC_OR_DEPARTMENT_OTHER): Payer: Medicare Other | Admitting: Internal Medicine

## 2022-05-02 DIAGNOSIS — T8131XA Disruption of external operation (surgical) wound, not elsewhere classified, initial encounter: Secondary | ICD-10-CM

## 2022-05-02 DIAGNOSIS — Z85820 Personal history of malignant melanoma of skin: Secondary | ICD-10-CM | POA: Diagnosis not present

## 2022-05-02 DIAGNOSIS — L97822 Non-pressure chronic ulcer of other part of left lower leg with fat layer exposed: Secondary | ICD-10-CM | POA: Diagnosis not present

## 2022-05-02 DIAGNOSIS — I890 Lymphedema, not elsewhere classified: Secondary | ICD-10-CM | POA: Diagnosis not present

## 2022-05-02 DIAGNOSIS — Z8542 Personal history of malignant neoplasm of other parts of uterus: Secondary | ICD-10-CM | POA: Diagnosis not present

## 2022-05-02 DIAGNOSIS — G14 Postpolio syndrome: Secondary | ICD-10-CM | POA: Diagnosis not present

## 2022-05-03 NOTE — Progress Notes (Signed)
TAILER, VOLKERT ([id number]) [id number] Page 1 of 7 Visit Report for 05/02/2022 Arrival Information Details Patient Name: Date of Service: [name], [name]. 05/02/2022 1:30 PM Medical Record Number: [id number] Patient Account Number: [id number] Date of Birth/Sex: Treating RN: 1950-04-12 (72 y.o. [name], Millard.Loa Primary Care Lakenya Riendeau: Creola Corn Other Clinician: Referring Kaitlynn Tramontana: Treating Jacarie Pate/Extender: Octavia Bruckner in Treatment: 6 Visit Information History Since Last Visit Added or deleted any medications: No Patient Arrived: Crutches Any new allergies or adverse reactions: No Arrival Time: 13:33 Had a fall or experienced change in No Accompanied By: self activities of daily living that may affect Transfer Assistance: None risk of falls: Patient Identification Verified: Yes Signs or symptoms of abuse/neglect since last visito No Secondary Verification Process Completed: Yes Hospitalized since last visit: No Patient Requires Transmission-Based Precautions: No Implantable device outside of the clinic excluding No Patient Has Alerts: No cellular tissue based products placed in the center since last visit: Has Dressing in Place as Prescribed: Yes Has Compression in Place as Prescribed: Yes Pain Present Now: No Electronic Signature(s) Signed: 05/02/2022 5:00:20 PM By: Shawn Stall RN, BSN Entered By: Shawn Stall on 05/02/2022 13:33:46 -------------------------------------------------------------------------------- Encounter Discharge Information Details Patient Name: Date of Service: Desiree Congress. 05/02/2022 1:30 PM Medical Record Number: [id number] Patient Account Number: [id number] Date of Birth/Sex: Treating RN: Dec 12, 1950 (72 y.o. [name], [name] Primary Care Desi Rowe: Creola Corn Other Clinician: Referring Ismahan Lippman: Treating Meilah Delrosario/Extender: Octavia Bruckner in Treatment: 6 Encounter Discharge  Information Items Post Procedure Vitals Discharge Condition: Stable Temperature (F): 98.7 Ambulatory Status: Crutches Pulse (bpm): 90 Discharge Destination: Home Respiratory Rate (breaths/min): 20 Transportation: Private Auto Blood Pressure (mmHg): 122/80 Accompanied By: self Schedule Follow-up Appointment: Yes Clinical Summary of Care: Electronic Signature(s) Signed: 05/02/2022 5:00:20 PM By: Shawn Stall RN, BSN Entered By: Shawn Stall on 05/02/2022 14:00:32 -------------------------------------------------------------------------------- Lower Extremity Assessment Details Patient Name: Date of Service: Desiree David Stall. 05/02/2022 1:30 PM Medical Record Number: [id number] Patient Account Number: [id number] Date of Birth/Sex: Treating RN: 12-31-1950 (71 y.o. [name] Primary Care Jasnoor Trussell: Creola Corn Other Clinician: Referring Veleria Barnhardt: Treating Emil Weigold/Extender: Octavia Bruckner in Treatment: 6 Edema Assessment G[Left: Noralee Space ([id number])] Franne Forts: [id number] Page 2 of 7] Assessed: [Left: Yes] [Right: No] Edema: [Left: Ye] [Right: s] Calf Left: Right: Point of Measurement: 27 cm From Medial Instep 32 cm Ankle Left: Right: Point of Measurement: 9 cm From Medial Instep 22 cm Vascular Assessment Pulses: Dorsalis Pedis Palpable: [Left:Yes] Notes attempted ABIs again- patient unable to handle pain with BP cuff. Physician made aware. Electronic Signature(s) Signed: 05/02/2022 5:00:20 PM By: Shawn Stall RN, BSN Entered By: Shawn Stall on 05/02/2022 14:05:36 -------------------------------------------------------------------------------- Multi Wound Chart Details Patient Name: Date of Service: Desiree Congress. 05/02/2022 1:30 PM Medical Record Number: [id number] Patient Account Number: [id number] Date of Birth/Sex: Treating RN: Dec 22, 1950 (72 y.o. F) Primary Care Porchea Charrier: Creola Corn Other  Clinician: Referring Malley Hauter: Treating Nautica Hotz/Extender: Octavia Bruckner in Treatment: 6 Vital Signs Height(in): 62 Pulse(bpm): 90 Weight(lbs): 148 Blood Pressure(mmHg): 122/80 Body Mass Index(BMI): 27.1 Temperature(F): 98.7 Respiratory Rate(breaths/min): 20 [1:Photos:] [N/A:N/A] Left, Anterior Lower Leg N/A N/A Wound Location: Surgical Injury N/A N/A Wounding Event: Dehisced Wound N/A N/A Primary Etiology: Asthma, Hypertension, Received N/A N/A Comorbid History: Chemotherapy, Received Radiation 03/01/2022 N/A N/A Date Acquired: 6 N/A N/A Weeks of Treatment: Open N/A N/A Wound Status: No N/A N/A Wound Recurrence: 3x1.9x0.1 N/A N/A Measurements L  x W x D (cm) 4.477 N/A N/A A (cm) : rea 0.448 N/A N/A Volume (cm) : 72.20% N/A N/A % Reduction in Area: 72.20% N/A N/A % Reduction in Volume: Full Thickness Without Exposed N/A N/A Classification: Support Structures Medium N/A N/A Exudate Amount: Serosanguineous N/A N/A Exudate Type: red, brown N/A N/A Exudate Color: Distinct, outline attached N/A N/A Wound Margin: Small (1-33%) N/A N/A Granulation Amount: [name], [name] ([id number]) [id number] Page 3 of 7 Red, Pink N/A N/A Granulation Quality: Large (67-100%) N/A N/A Necrotic Amount: Eschar N/A N/A Necrotic Tissue: Fat Layer (Subcutaneous Tissue): Yes N/A N/A Exposed Structures: Fascia: No Tendon: No Muscle: No Joint: No Bone: No Medium (34-66%) N/A N/A Epithelialization: Debridement - Excisional N/A N/A Debridement: Pre-procedure Verification/Time Out 13:55 N/A N/A Taken: Lidocaine 4% Topical Solution N/A N/A Pain Control: Subcutaneous, Slough N/A N/A Tissue Debrided: Skin/Subcutaneous Tissue N/A N/A Level: 4.47 N/A N/A Debridement A (sq cm): rea Curette N/A N/A Instrument: Minimum N/A N/A Bleeding: Pressure N/A N/A Hemostasis A chieved: 0 N/A N/A Procedural Pain: 0 N/A N/A Post  Procedural Pain: Procedure was tolerated well N/A N/A Debridement Treatment Response: 3x1.9x0.1 N/A N/A Post Debridement Measurements L x W x D (cm) 0.448 N/A N/A Post Debridement Volume: (cm) Excoriation: No N/A N/A Periwound Skin Texture: Induration: No Callus: No Crepitus: No Rash: No Scarring: No Maceration: No N/A N/A Periwound Skin Moisture: Dry/Scaly: No Atrophie Blanche: No N/A N/A Periwound Skin Color: Cyanosis: No Ecchymosis: No Erythema: No Hemosiderin Staining: No Mottled: No Pallor: No Rubor: No Debridement N/A N/A Procedures Performed: Treatment Notes Wound #1 (Lower Leg) Wound Laterality: Left, Anterior Cleanser Soap and Water Discharge Instruction: May shower and wash wound with dial antibacterial soap and water prior to dressing change. Vashe 5.8 (oz) Discharge Instruction: Cleanse the wound with Vashe prior to applying a clean dressing using gauze sponges, not tissue or cotton balls. Peri-Wound Care Sween Lotion (Moisturizing lotion) Discharge Instruction: Apply moisturizing lotion as directed Topical Primary Dressing Hydrofera Blue Ready Transfer Foam, 2.5x2.5 (in/in) Discharge Instruction: Apply directly over the santyl. Santyl Ointment Discharge Instruction: Apply nickel thick amount to wound bed as instructed Secondary Dressing Woven Gauze Sponge, Non-Sterile 4x4 in Discharge Instruction: Apply over primary dressing as directed. Secured With [employer] 4x5 (in/yd) Discharge Instruction: Secure with Coban as directed. Kerlix Roll Sterile, 4.5x3.1 (in/yd) Discharge Instruction: Secure with Kerlix as directed. Compression Wrap Compression Stockings Add-Ons [name], [name] ([id number]) [id number] Page 4 of 7 Electronic Signature(s) Signed: 05/02/2022 3:46:02 PM By: Geralyn Corwin DO Entered By: Geralyn Corwin on 05/02/2022  15:03:10 -------------------------------------------------------------------------------- Multi-Disciplinary Care Plan Details Patient Name: Date of Service: Desiree Congress. 05/02/2022 1:30 PM Medical Record Number: [id number] Patient Account Number: [id number] Date of Birth/Sex: Treating RN: 1950-06-29 (72 y.o. [name], [name] Primary Care Hung Rhinesmith: Creola Corn Other Clinician: Referring Keajah Killough: Treating Krosby Ritchie/Extender: Octavia Bruckner in Treatment: 6 Active Inactive Wound/Skin Impairment Nursing Diagnoses: Impaired tissue integrity Goals: Patient/caregiver will verbalize understanding of skin care regimen Date Initiated: 03/21/2022 Target Resolution Date: 07/03/2022 Goal Status: Active Interventions: Assess ulceration(s) every visit Treatment Activities: Skin care regimen initiated : 03/21/2022 Notes: Electronic Signature(s) Signed: 05/02/2022 5:00:20 PM By: Shawn Stall RN, BSN Entered By: Shawn Stall on 05/02/2022 13:43:25 -------------------------------------------------------------------------------- Pain Assessment Details Patient Name: Date of Service: Desiree Congress. 05/02/2022 1:30 PM Medical Record Number: [id number] Patient Account Number: [id number] Date of Birth/Sex: Treating RN: 11/04/1950 (72 y.o. [name] Primary Care Florella Mcneese: Creola Corn Other Clinician: Referring  Emberli Ballester: Treating Jaymari Cromie/Extender: Octavia Bruckner in Treatment: 6 Active Problems Location of Pain Severity and Description of Pain Patient Has Paino No Site Locations [facility], [facility] [place] ([id number]) [id number] Page 5 of 7 Pain Management and Medication Current Pain Management: Electronic Signature(s) Signed: 05/02/2022 5:00:20 PM By: Shawn Stall RN, BSN Entered By: Shawn Stall on 05/02/2022 13:33:52 -------------------------------------------------------------------------------- Patient/Caregiver Education  Details Patient Name: Date of Service: Desiree Congress 4/29/2024andnbsp1:30 PM Medical Record Number: [id number] Patient Account Number: [id number] Date of Birth/Gender: Treating RN: 1950/08/21 (72 y.o. [name] Primary Care Physician: Creola Corn Other Clinician: Referring Physician: Treating Physician/Extender: Octavia Bruckner in Treatment: 6 Education Assessment Education Provided To: Patient Education Topics Provided Wound/Skin Impairment: Handouts: Caring for Your Ulcer Methods: Explain/Verbal Responses: Reinforcements needed Electronic Signature(s) Signed: 05/02/2022 5:00:20 PM By: Shawn Stall RN, BSN Entered By: Shawn Stall on 05/02/2022 13:43:33 -------------------------------------------------------------------------------- Wound Assessment Details Patient Name: Date of Service: Desiree Congress. 05/02/2022 1:30 PM Medical Record Number: [id number] Patient Account Number: [id number] Date of Birth/Sex: Treating RN: [birth year] (72 y.o. [name] Primary Care Reiko Vinje: Creola Corn Other Clinician: Referring Asmaa Tirpak: Treating Milyn Stapleton/Extender: Octavia Bruckner in Treatment: 6 Wound Status Wound Number: 1 Primary Dehisced Wound Etiology: Wound Location: Left, Anterior Lower Leg Wound Status: Open Wounding Event: Surgical Injury Notes: pt. had a Melanoma removed by Dr. Irene Limbo Date Acquired: 03/01/2022 Comorbid Asthma, Hypertension, Received Chemotherapy, Received Weeks Of Treatment: 6 [name], [name] ([id number]) [id number] Page 6 of 7 Weeks Of Treatment: 6 History: Radiation Clustered Wound: No Photos Wound Measurements Length: (cm) 3 Width: (cm) 1.9 Depth: (cm) 0.1 Area: (cm) 4.477 Volume: (cm) 0.448 % Reduction in Area: 72.2% % Reduction in Volume: 72.2% Epithelialization: Medium (34-66%) Tunneling: No Undermining: No Wound Description Classification: Full Thickness  Without Exposed Support Structures Wound Margin: Distinct, outline attached Exudate Amount: Medium Exudate Type: Serosanguineous Exudate Color: red, brown Foul Odor After Cleansing: No Slough/Fibrino No Wound Bed Granulation Amount: Small (1-33%) Exposed Structure Granulation Quality: Red, Pink Fascia Exposed: No Necrotic Amount: Large (67-100%) Fat Layer (Subcutaneous Tissue) Exposed: Yes Necrotic Quality: Eschar Tendon Exposed: No Muscle Exposed: No Joint Exposed: No Bone Exposed: No Periwound Skin Texture Texture Color No Abnormalities Noted: No No Abnormalities Noted: No Callus: No Atrophie Blanche: No Crepitus: No Cyanosis: No Excoriation: No Ecchymosis: No Induration: No Erythema: No Rash: No Hemosiderin Staining: No Scarring: No Mottled: No Pallor: No Moisture Rubor: No No Abnormalities Noted: No Dry / Scaly: No Maceration: No Treatment Notes Wound #1 (Lower Leg) Wound Laterality: Left, Anterior Cleanser Soap and Water Discharge Instruction: May shower and wash wound with dial antibacterial soap and water prior to dressing change. Vashe 5.8 (oz) Discharge Instruction: Cleanse the wound with Vashe prior to applying a clean dressing using gauze sponges, not tissue or cotton balls. Peri-Wound Care Sween Lotion (Moisturizing lotion) Discharge Instruction: Apply moisturizing lotion as directed Topical Primary Dressing Hydrofera Blue Ready Transfer Foam, 2.5x2.5 (in/in) Discharge Instruction: Apply directly over the santyl. [name], [name] ([id number]) [id number] Page 7 of 7 Santyl Ointment Discharge Instruction: Apply nickel thick amount to wound bed as instructed Secondary Dressing Woven Gauze Sponge, Non-Sterile 4x4 in Discharge Instruction: Apply over primary dressing as directed. Secured With [employer] 4x5 (in/yd) Discharge Instruction: Secure with Coban as directed. Kerlix Roll Sterile, 4.5x3.1  (in/yd) Discharge Instruction: Secure with Kerlix as directed. Compression Wrap Compression Stockings Add-Ons Electronic Signature(s) Signed: 05/02/2022 5:00:20 PM By: Shawn Stall RN, BSN Entered  By: Shawn Stall on 05/02/2022 13:41:45 -------------------------------------------------------------------------------- Vitals Details Patient Name: Date of Service: [name], [name]. 05/02/2022 1:30 PM Medical Record Number: [id number] Patient Account Number: [id number] Date of Birth/Sex: Treating RN: November 06, 1950 (72 y.o. [name], [name] Primary Care Noma Quijas: Creola Corn Other Clinician: Referring Gregoire Bennis: Treating Lebaron Bautch/Extender: Octavia Bruckner in Treatment: 6 Vital Signs Time Taken: 13:37 Temperature (F): 98.7 Height (in): 62 Pulse (bpm): 90 Weight (lbs): 148 Respiratory Rate (breaths/min): 20 Body Mass Index (BMI): 27.1 Blood Pressure (mmHg): 122/80 Reference Range: 80 - 120 mg / dl Electronic Signature(s) Signed: 05/02/2022 5:00:20 PM By: Shawn Stall RN, BSN Entered By: Shawn Stall on 05/02/2022 13:37:12

## 2022-05-03 NOTE — Progress Notes (Signed)
MYIESHA, EDGAR ([id number]) [id number] Page 1 of 8 Visit Report for 05/02/2022 Chief Complaint Document Details Patient Name: Date of Service: [name], [name]. 05/02/2022 1:30 PM Medical Record Number: [id number] Patient Account Number: [id number] Date of Birth/Sex: Treating RN: 1950-12-16 (72 y.o. F) Primary Care Provider: Creola Corn Other Clinician: Referring Provider: Treating Provider/Extender: Octavia Bruckner in Treatment: 6 Information Obtained from: Patient Chief Complaint 03/21/2022; left lower extremity wound Electronic Signature(s) Signed: 05/02/2022 3:46:02 PM By: Geralyn Corwin DO Entered By: Geralyn Corwin on 05/02/2022 15:03:17 -------------------------------------------------------------------------------- Debridement Details Patient Name: Date of Service: Desiree Congress. 05/02/2022 1:30 PM Medical Record Number: [id number] Patient Account Number: [id number] Date of Birth/Sex: Treating RN: May 12, 1950 (71 y.o. Debara Pickett, Millard.Loa Primary Care Provider: Creola Corn Other Clinician: Referring Provider: Treating Provider/Extender: Octavia Bruckner in Treatment: 6 Debridement Performed for Assessment: Wound #1 Left,Anterior Lower Leg Performed By: Physician Geralyn Corwin, DO Debridement Type: Debridement Level of Consciousness (Pre-procedure): Awake and Alert Pre-procedure Verification/Time Out Yes - 13:55 Taken: Start Time: 13:56 Pain Control: Lidocaine 4% T opical Solution Percent of Wound Bed Debrided: 100% T Area Debrided (cm): otal 4.47 Tissue and other material debrided: Viable, Non-Viable, Slough, Subcutaneous, Skin: Dermis , Skin: Epidermis, Slough Level: Skin/Subcutaneous Tissue Debridement Description: Excisional Instrument: Curette Bleeding: Minimum Hemostasis Achieved: Pressure End Time: 14:00 Procedural Pain: 0 Post Procedural Pain: 0 Response to Treatment: Procedure was  tolerated well Level of Consciousness (Post- Awake and Alert procedure): Post Debridement Measurements of Total Wound Length: (cm) 3 Width: (cm) 1.9 Depth: (cm) 0.1 Volume: (cm) 0.448 Character of Wound/Ulcer Post Debridement: Requires Further Debridement Post Procedure Diagnosis Same as Pre-procedure Electronic Signature(s) Signed: 05/02/2022 3:46:02 PM By: Geralyn Corwin DO Signed: 05/02/2022 5:00:20 PM By: Shawn Stall RN, BSN Entered By: Shawn Stall on 05/02/2022 13:58:21 Eric Form ([id number]) [id number] Page 2 of 8 -------------------------------------------------------------------------------- HPI Details Patient Name: Date of Service: Desiree ZOUA, CAPORASO. 05/02/2022 1:30 PM Medical Record Number: [id number] Patient Account Number: [id number] Date of Birth/Sex: Treating RN: 07-Mar-1950 (72 y.o. F) Primary Care Provider: Creola Corn Other Clinician: Referring Provider: Treating Provider/Extender: Octavia Bruckner in Treatment: 6 History of Present Illness HPI Description: 03/21/2022 [name] is a 72 year old female with a past medical history of postpolio syndrome, endometrial cancer and essential hypertension that presents the clinic for a 1 month history of nonhealing ulcer to the left lower extremity. She states that she had a melanoma removed from the left lower extremity on 2/27 by Dr. Irene Limbo. After the surgery the wound dehisced. They have been using compression wraps. It is unclear what dressing has been used. She has no issues or complaints today. She denies signs of infection. 3/25; Patient presents for follow-up. We have been using Santyl and Hydrofera Blue under Kerlix/Coban. There is been improvement in wound healing. She had no issues with the compression wrap. 4/8; patient presents for follow-up. We have been using Santyl Hydrofera Blue under Kerlix/Coban. Wound continues to improve in size and appearance.  She still has slough buildup. I think in the near future she would do well with a skin substitute. We discussed this and patient was agreeable to proceed with insurance verification. We will Desiree ahead and run her for PuraPly and Grafix. 4/15; patient presents for follow-up. We have been using Santyl and Hydrofera Blue under Kerlix/Coban. The wound is smaller. She has been approved for PuraPly and she is agreeable to proceed with placement today. She denies  signs of infection. 4/22; patient presents for follow-up. We placed PuraPly on the wound bed at last clinic visit under Kerlix/Coban. Unfortunately the PuraPly dried out. She denies signs of infection. 4/29; patient presents for follow-up. We placed Grafix on the wound bed at last clinic visit under Kerlix/Coban. Again the wound bed appears dry. No signs of infection. Electronic Signature(s) Signed: 05/02/2022 3:46:02 PM By: Geralyn Corwin DO Entered By: Geralyn Corwin on 05/02/2022 15:03:44 -------------------------------------------------------------------------------- Physical Exam Details Patient Name: Date of Service: Desiree Congress. 05/02/2022 1:30 PM Medical Record Number: [id number] Patient Account Number: [id number] Date of Birth/Sex: Treating RN: 21-May-1950 (72 y.o. F) Primary Care Provider: Creola Corn Other Clinician: Referring Provider: Treating Provider/Extender: Octavia Bruckner in Treatment: 6 Constitutional respirations regular, non-labored and within target range for patient.. Cardiovascular 2+ dorsalis pedis/posterior tibialis pulses. Psychiatric pleasant and cooperative. Notes Left lower extremity: Open wound to the anterior aspect with granulation tissue and nonviable tissue. No signs of surrounding infection. Nonpitting edema. Venous stasis dermatitis. Electronic Signature(s) Signed: 05/02/2022 3:46:02 PM By: Geralyn Corwin DO Entered By: Geralyn Corwin on 05/02/2022  15:04:16 -------------------------------------------------------------------------------- Physician Orders Details Patient Name: Date of Service: Desiree Congress. 05/02/2022 1:30 PM Medical Record Number: [id number] Patient Account Number: [id number] AHYANA, SKILLIN ([id number]) [id number] Page 3 of 8 Date of Birth/Sex: Treating RN: 1950/12/21 (72 y.o. [name] Primary Care Provider: Other Clinician: Creola Corn Referring Provider: Treating Provider/Extender: Octavia Bruckner in Treatment: 6 Verbal / Phone Orders: No Diagnosis Coding ICD-10 Coding Code Description [phone] Non-pressure chronic ulcer of other part of left lower leg with fat layer exposed T81.31XA Disruption of external operation (surgical) wound, not elsewhere classified, initial encounter C43.9 Malignant melanoma of skin, unspecified G14 Postpolio syndrome C54.1 Malignant neoplasm of endometrium I89.0 Lymphedema, not elsewhere classified Follow-up Appointments ppointment in 1 week. - Dr. Mikey Bussing 05/09/2022 130pm Return A ppointment in 2 weeks. - Dr. Mikey Bussing 05/16/2022 130pm Return A Anesthetic (In clinic) Topical Lidocaine 4% applied to wound bed - used in clinic Cellular or Tissue Based Products Wound #1 Left,Anterior Lower Leg Cellular or Tissue Based Product Type: - Puraply AM #1 applied 04/18/22 CHANGED TO Grafix 2x3 applied today 04/25/2022 Bathing/ Shower/ Hygiene May shower with protection but do not get wound dressing(s) wet. Protect dressing(s) with water repellant cover (for example, large plastic bag) or a cast cover and may then take shower. Edema Control - Lymphedema / SCD / Other Elevate legs to the level of the heart or above for 30 minutes daily and/or when sitting for 3-4 times a day throughout the day. Avoid standing for long periods of time. Exercise regularly - Walk/exercise a s tolerated throughout the day Moisturize legs daily. Wound  Treatment Wound #1 - Lower Leg Wound Laterality: Left, Anterior Cleanser: Soap and Water 1 x Per Week/30 Days Discharge Instructions: May shower and wash wound with dial antibacterial soap and water prior to dressing change. Cleanser: Vashe 5.8 (oz) 1 x Per Week/30 Days Discharge Instructions: Cleanse the wound with Vashe prior to applying a clean dressing using gauze sponges, not tissue or cotton balls. Peri-Wound Care: Sween Lotion (Moisturizing lotion) 1 x Per Week/30 Days Discharge Instructions: Apply moisturizing lotion as directed Prim Dressing: Hydrofera Blue Ready Transfer Foam, 2.5x2.5 (in/in) 1 x Per Week/30 Days ary Discharge Instructions: Apply directly over the santyl. Prim Dressing: Santyl Ointment 1 x Per Week/30 Days ary Discharge Instructions: Apply nickel thick amount to wound bed as instructed Secondary Dressing: Woven  Gauze Sponge, Non-Sterile 4x4 in 1 x Per Week/30 Days Discharge Instructions: Apply over primary dressing as directed. Secured With: Coban Self-Adherent Wrap 4x5 (in/yd) 1 x Per Week/30 Days Discharge Instructions: Secure with Coban as directed. Secured With: [employer], 4.5x3.1 (in/yd) 1 x Per Week/30 Days Discharge Instructions: Secure with Kerlix as directed. Services and Therapies rterial Studies- Unilateral - arterial studies with ABIs and TBIs to left leg due to non healing wound. - (ICD10 [id number] - Non-pressure chronic ulcer of A other part of left lower leg with fat layer exposed) Electronic Signature(s) Signed: 05/02/2022 3:46:02 PM By: Geralyn Corwin DO Entered By: Geralyn Corwin on 05/02/2022 15:04:25 Eric Form ([id number]) [id number] Page 4 of 8 Prescription 05/02/2022 -------------------------------------------------------------------------------- Eric Form. Geralyn Corwin DO Patient Name: Provider: 1950-09-27 [id number] Date of Birth: NPI#: [id number] Sex: DEA #: [phone] Phone #: License #: UPN: Patient Address: [address] [facility] [place], [place] [address] [address] [place], [place] [address] [phone] Allergies mold; house dust; latex Provider's Orders rterial Studies- Unilateral - ICD10: L97.822 - arterial studies with ABIs and TBIs to left leg due to non healing wound. A Hand Signature: Date(s): Electronic Signature(s) Signed: 05/02/2022 3:46:02 PM By: Geralyn Corwin DO Entered By: Geralyn Corwin on 05/02/2022 15:04:25 -------------------------------------------------------------------------------- Problem List Details Patient Name: Date of Service: Desiree Congress. 05/02/2022 1:30 PM Medical Record Number: [id number] Patient Account Number: [id number] Date of Birth/Sex: Treating RN: [birth year] (72 y.o. Debara Pickett, Yvonne Kendall Primary Care Provider: Creola Corn Other Clinician: Referring Provider: Treating Provider/Extender: Octavia Bruckner in Treatment: 6 Active Problems ICD-10 Encounter Code Description Active Date MDM Diagnosis [phone] Non-pressure chronic ulcer of other part of left lower leg with fat layer exposed3/18/2024 No Yes T81.31XA Disruption of external operation (surgical) wound, not elsewhere classified, 03/21/2022 No Yes initial encounter C43.9 Malignant melanoma of skin, unspecified 03/21/2022 No Yes G14 Postpolio syndrome 03/21/2022 No Yes C54.1 Malignant neoplasm of endometrium 03/21/2022 No Yes I89.0 Lymphedema, not elsewhere classified 03/21/2022 No Yes [name], [name] ([id number]) [id number] Page 5 of 8 Inactive Problems Resolved Problems Electronic Signature(s) Signed: 05/02/2022 3:46:02 PM By: Geralyn Corwin DO Entered By: Geralyn Corwin on 05/02/2022 15:03:06 -------------------------------------------------------------------------------- Progress Note Details Patient Name: Date of  Service: Desiree Congress. 05/02/2022 1:30 PM Medical Record Number: [id number] Patient Account Number: [id number] Date of Birth/Sex: Treating RN: 1950/05/30 (72 y.o. F) Primary Care Provider: Creola Corn Other Clinician: Referring Provider: Treating Provider/Extender: Octavia Bruckner in Treatment: 6 Subjective Chief Complaint Information obtained from Patient 03/21/2022; left lower extremity wound History of Present Illness (HPI) 03/21/2022 Ms. Shamonique Battiste is a 72 year old female with a past medical history of postpolio syndrome, endometrial cancer and essential hypertension that presents the clinic for a 1 month history of nonhealing ulcer to the left lower extremity. She states that she had a melanoma removed from the left lower extremity on 2/27 by Dr. Irene Limbo. After the surgery the wound dehisced. They have been using compression wraps. It is unclear what dressing has been used. She has no issues or complaints today. She denies signs of infection. 3/25; Patient presents for follow-up. We have been using Santyl and Hydrofera Blue under Kerlix/Coban. There is been improvement in wound healing. She had no issues with the compression wrap. 4/8; patient presents for follow-up. We have been using Santyl Hydrofera Blue under Kerlix/Coban. Wound continues to improve in size and appearance. She  still has slough buildup. I think in the near future she would do well with a skin substitute. We discussed this and patient was agreeable to proceed with insurance verification. We will Desiree ahead and run her for PuraPly and Grafix. 4/15; patient presents for follow-up. We have been using Santyl and Hydrofera Blue under Kerlix/Coban. The wound is smaller. She has been approved for PuraPly and she is agreeable to proceed with placement today. She denies signs of infection. 4/22; patient presents for follow-up. We placed PuraPly on the wound bed at last clinic visit under Kerlix/Coban.  Unfortunately the PuraPly dried out. She denies signs of infection. 4/29; patient presents for follow-up. We placed Grafix on the wound bed at last clinic visit under Kerlix/Coban. Again the wound bed appears dry. No signs of infection. Patient History Information obtained from Patient. Family History Cancer - Mother,Father,Siblings,Child,Paternal Grandparents,Maternal Grandparents, Heart Disease - Mother. Social History Never smoker, Marital Status - Married, Alcohol Use - Rarely, Drug Use - No History, Caffeine Use - Never. Medical History Respiratory Patient has history of Asthma Cardiovascular Patient has history of Hypertension - Unspecified essential HTN Oncologic Patient has history of Received Chemotherapy, Received Radiation - 05/06/19-06/04/19 Vaginal Brachytherapy (endometrialcancer) Hospitalization/Surgery History - Robotic assisted total hysterectomy; Right T hip. otal Medical A Surgical History Notes nd Cardiovascular hyperlipidemia Musculoskeletal Hx: Polio -Left leg wears a brace Neurologic ZO:XWRUE JAZMON, KOS ([id number]) [id number] Page 6 of 8 Objective Constitutional respirations regular, non-labored and within target range for patient.. Vitals Time Taken: 1:37 PM, Height: 62 in, Weight: 148 lbs, BMI: 27.1, Temperature: 98.7 F, Pulse: 90 bpm, Respiratory Rate: 20 breaths/min, Blood Pressure: 122/80 mmHg. Cardiovascular 2+ dorsalis pedis/posterior tibialis pulses. Psychiatric pleasant and cooperative. General Notes: Left lower extremity: Open wound to the anterior aspect with granulation tissue and nonviable tissue. No signs of surrounding infection. Nonpitting edema. Venous stasis dermatitis. Integumentary (Hair, Skin) Wound #1 status is Open. Original cause of wound was Surgical Injury. The date acquired was: 03/01/2022. The wound has been in treatment 6 weeks. The wound is located on the Left,Anterior Lower Leg. The wound  measures 3cm length x 1.9cm width x 0.1cm depth; 4.477cm^2 area and 0.448cm^3 volume. There is Fat Layer (Subcutaneous Tissue) exposed. There is no tunneling or undermining noted. There is a medium amount of serosanguineous drainage noted. The wound margin is distinct with the outline attached to the wound base. There is small (1-33%) red, pink granulation within the wound bed. There is a large (67- 100%) amount of necrotic tissue within the wound bed including Eschar. The periwound skin appearance did not exhibit: Callus, Crepitus, Excoriation, Induration, Rash, Scarring, Dry/Scaly, Maceration, Atrophie Blanche, Cyanosis, Ecchymosis, Hemosiderin Staining, Mottled, Pallor, Rubor, Erythema. Assessment Active Problems ICD-10 Non-pressure chronic ulcer of other part of left lower leg with fat layer exposed Disruption of external operation (surgical) wound, not elsewhere classified, initial encounter Malignant melanoma of skin, unspecified Postpolio syndrome Malignant neoplasm of endometrium Lymphedema, not elsewhere classified Patient's wound is stable. I debrided nonviable tissue. She has palpable pedal pulses however we were unable to obtain ABIs during the admission due to location of the wound and patient not tolerating. At this time I recommended formal ABIs with TBI's to ensure she has adequate blood flow for healing. I recommended going back to [facility] and adding Santyl as the skin substitutes have been drying out her wound bed. Continue Kerlix/Coban. Follow-up in 1 week Procedures Wound #1 Pre-procedure diagnosis of Wound #1 is a Dehisced Wound located on the Left,Anterior  Lower Leg . There was a Excisional Skin/Subcutaneous Tissue Debridement with a total area of 4.47 sq cm performed by Geralyn Corwin, DO. With the following instrument(s): Curette to remove Viable and Non-Viable tissue/material. Material removed includes Subcutaneous Tissue, Slough, Skin: Dermis, and Skin:  Epidermis after achieving pain control using Lidocaine 4% T opical Solution. A time out was conducted at 13:55, prior to the start of the procedure. A Minimum amount of bleeding was controlled with Pressure. The procedure was tolerated well with a pain level of 0 throughout and a pain level of 0 following the procedure. Post Debridement Measurements: 3cm length x 1.9cm width x 0.1cm depth; 0.448cm^3 volume. Character of Wound/Ulcer Post Debridement requires further debridement. Post procedure Diagnosis Wound #1: Same as Pre-Procedure Plan Follow-up Appointments: Return Appointment in 1 week. - Dr. Mikey Bussing 05/09/2022 130pm Return Appointment in 2 weeks. - Dr. Mikey Bussing 05/16/2022 130pm Anesthetic: (In clinic) Topical Lidocaine 4% applied to wound bed - used in clinic Cellular or Tissue Based Products: Wound #1 Left,Anterior Lower Leg: Cellular or Tissue Based Product Type: - Puraply AM #1 applied 04/18/22 CHANGED TO Grafix 2x3 applied today 04/25/2022 Bathing/ Shower/ Hygiene: May shower with protection but do not get wound dressing(s) wet. Protect dressing(s) with water repellant cover (for example, large plastic bag) or a cast cover and may then take shower. [name], [name] ([id number]) [id number] Page 7 of 8 Edema Control - Lymphedema / SCD / Other: Elevate legs to the level of the heart or above for 30 minutes daily and/or when sitting for 3-4 times a day throughout the day. Avoid standing for long periods of time. Exercise regularly - Walk/exercise a s tolerated throughout the day Moisturize legs daily. Services and Therapies ordered were: Arterial Studies- Unilateral - arterial studies with ABIs and TBIs to left leg due to non healing wound. WOUND #1: - Lower Leg Wound Laterality: Left, Anterior Cleanser: Soap and Water 1 x Per Week/30 Days Discharge Instructions: May shower and wash wound with dial antibacterial soap and water prior to dressing change. Cleanser:  Vashe 5.8 (oz) 1 x Per Week/30 Days Discharge Instructions: Cleanse the wound with Vashe prior to applying a clean dressing using gauze sponges, not tissue or cotton balls. Peri-Wound Care: Sween Lotion (Moisturizing lotion) 1 x Per Week/30 Days Discharge Instructions: Apply moisturizing lotion as directed Prim Dressing: Hydrofera Blue Ready Transfer Foam, 2.5x2.5 (in/in) 1 x Per Week/30 Days ary Discharge Instructions: Apply directly over the santyl. Prim Dressing: Santyl Ointment 1 x Per Week/30 Days ary Discharge Instructions: Apply nickel thick amount to wound bed as instructed Secondary Dressing: Woven Gauze Sponge, Non-Sterile 4x4 in 1 x Per Week/30 Days Discharge Instructions: Apply over primary dressing as directed. Secured With: Coban Self-Adherent Wrap 4x5 (in/yd) 1 x Per Week/30 Days Discharge Instructions: Secure with Coban as directed. Secured With: [employer], 4.5x3.1 (in/yd) 1 x Per Week/30 Days Discharge Instructions: Secure with Kerlix as directed. 1. In office sharp debridement 2. Hydrofera Blue and Santyl under Kerlix/Cobanooleft lower extremity 3. Follow-up in 1 week 4. ABIs with TBI's Electronic Signature(s) Signed: 05/02/2022 3:46:02 PM By: Geralyn Corwin DO Entered By: Geralyn Corwin on 05/02/2022 15:06:05 -------------------------------------------------------------------------------- HxROS Details Patient Name: Date of Service: Desiree David Stall. 05/02/2022 1:30 PM Medical Record Number: [id number] Patient Account Number: [id number] Date of Birth/Sex: Treating RN: 09/01/1950 (72 y.o. F) Primary Care Provider: Creola Corn Other Clinician: Referring Provider: Treating Provider/Extender: Octavia Bruckner in Treatment: 6 Information Obtained From Patient Respiratory Medical History: Positive  for: Asthma Cardiovascular Medical History: Positive for: Hypertension - Unspecified essential HTN Past Medical History  Notes: hyperlipidemia Musculoskeletal Medical History: Past Medical History Notes: Hx: Polio -Left leg wears a brace Neurologic Medical History: Past Medical History Notes: ZO:XWRUE Oncologic Medical History: Positive for: Received Chemotherapy; Received Radiation - 05/06/19-06/04/19 Vaginal Brachytherapy (endometrialcancer) LASHARA, UREY ([id number]) [id number] Page 8 of 8 Immunizations Pneumococcal Vaccine: Received Pneumococcal Vaccination: Yes Received Pneumococcal Vaccination On or After 60th Birthday: Yes Implantable Devices Yes Hospitalization / Surgery History Type of Hospitalization/Surgery Robotic assisted total hysterectomy; Right T hip otal Family and Social History Cancer: Yes - Mother,Father,Siblings,Child,Paternal Grandparents,Maternal Grandparents; Heart Disease: Yes - Mother; Never smoker; Marital Status - Married; Alcohol Use: Rarely; Drug Use: No History; Caffeine Use: Never; Financial Concerns: No; Food, Clothing or Shelter Needs: No; Support System Lacking: No; Transportation Concerns: No Electronic Signature(s) Signed: 05/02/2022 3:46:02 PM By: Geralyn Corwin DO Entered By: Geralyn Corwin on 05/02/2022 15:03:48 -------------------------------------------------------------------------------- SuperBill Details Patient Name: Date of Service: Desiree Congress. 05/02/2022 Medical Record Number: [id number] Patient Account Number: [id number] Date of Birth/Sex: Treating RN: [birth year] (72 y.o. [name] Primary Care Provider: Creola Corn Other Clinician: Referring Provider: Treating Provider/Extender: Octavia Bruckner in Treatment: 6 Diagnosis Coding ICD-10 Codes Code Description [phone] Non-pressure chronic ulcer of other part of left lower leg with fat layer exposed T81.31XA Disruption of external operation (surgical) wound, not elsewhere classified, initial encounter C43.9 Malignant melanoma of skin,  unspecified G14 Postpolio syndrome C54.1 Malignant neoplasm of endometrium I89.0 Lymphedema, not elsewhere classified Facility Procedures : CPT4 Code: [id number] Description: 11042 - DEB SUBQ TISSUE 20 SQ CM/< ICD-10 Diagnosis Description L97.822 Non-pressure chronic ulcer of other part of left lower leg with fat layer exposed T81.31XA Disruption of external operation (surgical) wound, not elsewhere classified,  init Modifier: ial encounter Quantity: 1 Physician Procedures : CPT4 Code Description Modifier [id number] 11042 - WC PHYS SUBQ TISS 20 SQ CM ICD-10 Diagnosis Description L97.822 Non-pressure chronic ulcer of other part of left lower leg with fat layer exposed T81.31XA Disruption of external operation (surgical) wound,  not elsewhere classified, initial encounter Quantity: 1 Electronic Signature(s) Signed: 05/02/2022 3:46:02 PM By: Geralyn Corwin DO Entered By: Geralyn Corwin on 05/02/2022 15:06:16

## 2022-05-04 ENCOUNTER — Ambulatory Visit (HOSPITAL_COMMUNITY)
Admission: RE | Admit: 2022-05-04 | Discharge: 2022-05-04 | Disposition: A | Discharge status: Home / Self Care | Payer: Medicare Other | Source: Ambulatory Visit | Attending: Vascular Surgery | Admitting: Vascular Surgery

## 2022-05-04 ENCOUNTER — Other Ambulatory Visit (HOSPITAL_COMMUNITY): Payer: Self-pay | Admitting: Internal Medicine

## 2022-05-04 DIAGNOSIS — R52 Pain, unspecified: Secondary | ICD-10-CM | POA: Diagnosis not present

## 2022-05-09 ENCOUNTER — Encounter (HOSPITAL_BASED_OUTPATIENT_CLINIC_OR_DEPARTMENT_OTHER): Payer: Medicare Other | Attending: Internal Medicine | Admitting: Internal Medicine

## 2022-05-09 DIAGNOSIS — T8131XA Disruption of external operation (surgical) wound, not elsewhere classified, initial encounter: Secondary | ICD-10-CM | POA: Insufficient documentation

## 2022-05-09 DIAGNOSIS — I890 Lymphedema, not elsewhere classified: Secondary | ICD-10-CM | POA: Diagnosis not present

## 2022-05-09 DIAGNOSIS — L97822 Non-pressure chronic ulcer of other part of left lower leg with fat layer exposed: Secondary | ICD-10-CM | POA: Insufficient documentation

## 2022-05-09 DIAGNOSIS — C439 Malignant melanoma of skin, unspecified: Secondary | ICD-10-CM | POA: Insufficient documentation

## 2022-05-09 DIAGNOSIS — G14 Postpolio syndrome: Secondary | ICD-10-CM | POA: Insufficient documentation

## 2022-05-09 DIAGNOSIS — C541 Malignant neoplasm of endometrium: Secondary | ICD-10-CM | POA: Insufficient documentation

## 2022-05-09 DIAGNOSIS — X58XXXA Exposure to other specified factors, initial encounter: Secondary | ICD-10-CM | POA: Insufficient documentation

## 2022-05-10 DIAGNOSIS — J301 Allergic rhinitis due to pollen: Secondary | ICD-10-CM | POA: Diagnosis not present

## 2022-05-10 DIAGNOSIS — J3089 Other allergic rhinitis: Secondary | ICD-10-CM | POA: Diagnosis not present

## 2022-05-11 ENCOUNTER — Telehealth: Payer: Self-pay | Admitting: Pulmonary Disease

## 2022-05-16 ENCOUNTER — Encounter (HOSPITAL_BASED_OUTPATIENT_CLINIC_OR_DEPARTMENT_OTHER): Payer: Medicare Other | Admitting: Internal Medicine

## 2022-05-16 DIAGNOSIS — C541 Malignant neoplasm of endometrium: Secondary | ICD-10-CM | POA: Diagnosis not present

## 2022-05-16 DIAGNOSIS — G14 Postpolio syndrome: Secondary | ICD-10-CM

## 2022-05-16 DIAGNOSIS — C439 Malignant melanoma of skin, unspecified: Secondary | ICD-10-CM | POA: Diagnosis not present

## 2022-05-16 DIAGNOSIS — L97822 Non-pressure chronic ulcer of other part of left lower leg with fat layer exposed: Secondary | ICD-10-CM

## 2022-05-16 DIAGNOSIS — T8131XA Disruption of external operation (surgical) wound, not elsewhere classified, initial encounter: Secondary | ICD-10-CM | POA: Diagnosis not present

## 2022-05-16 DIAGNOSIS — I890 Lymphedema, not elsewhere classified: Secondary | ICD-10-CM | POA: Diagnosis not present

## 2022-05-17 DIAGNOSIS — D2262 Melanocytic nevi of left upper limb, including shoulder: Secondary | ICD-10-CM | POA: Diagnosis not present

## 2022-05-17 DIAGNOSIS — Z85820 Personal history of malignant melanoma of skin: Secondary | ICD-10-CM | POA: Diagnosis not present

## 2022-05-17 DIAGNOSIS — L905 Scar conditions and fibrosis of skin: Secondary | ICD-10-CM | POA: Diagnosis not present

## 2022-05-17 DIAGNOSIS — D2272 Melanocytic nevi of left lower limb, including hip: Secondary | ICD-10-CM | POA: Diagnosis not present

## 2022-05-17 DIAGNOSIS — D485 Neoplasm of uncertain behavior of skin: Secondary | ICD-10-CM | POA: Diagnosis not present

## 2022-05-17 DIAGNOSIS — L821 Other seborrheic keratosis: Secondary | ICD-10-CM | POA: Diagnosis not present

## 2022-05-17 DIAGNOSIS — D225 Melanocytic nevi of trunk: Secondary | ICD-10-CM | POA: Diagnosis not present

## 2022-05-17 DIAGNOSIS — D2271 Melanocytic nevi of right lower limb, including hip: Secondary | ICD-10-CM | POA: Diagnosis not present

## 2022-05-17 DIAGNOSIS — D2261 Melanocytic nevi of right upper limb, including shoulder: Secondary | ICD-10-CM | POA: Diagnosis not present

## 2022-05-18 ENCOUNTER — Telehealth: Payer: Self-pay | Admitting: Pulmonary Disease

## 2022-05-18 DIAGNOSIS — J3089 Other allergic rhinitis: Secondary | ICD-10-CM | POA: Diagnosis not present

## 2022-05-18 DIAGNOSIS — J3081 Allergic rhinitis due to animal (cat) (dog) hair and dander: Secondary | ICD-10-CM | POA: Diagnosis not present

## 2022-05-18 DIAGNOSIS — J301 Allergic rhinitis due to pollen: Secondary | ICD-10-CM | POA: Diagnosis not present

## 2022-05-18 NOTE — Telephone Encounter (Signed)
Spoke with patient advised Symbicort was sent on 5/9 to her pharmacy. She verbalized understanding and will give them a call

## 2022-05-18 NOTE — Telephone Encounter (Signed)
ATC patient x1.  LVM to return call.  We do not get samples of Symbicort.  Message sent to PA team.

## 2022-05-18 NOTE — Telephone Encounter (Signed)
Good afternoon, patient states that Symbicort is requiring a PA, please advise.  Thank you.

## 2022-05-18 NOTE — Telephone Encounter (Signed)
Pt calling back because Pharmacy states pt will need a Prior Auth for Symbicort. I did inform the pt that won't happen by 3:30 today. Can we check to see if there is any samples of the symbicort for pt's trip?

## 2022-05-18 NOTE — Telephone Encounter (Signed)
Pt calling in bc she needs her Symbicort refilled, she leaves on her trip tomorrow and she needs to pick it up by 3:30 today.

## 2022-05-19 ENCOUNTER — Other Ambulatory Visit (HOSPITAL_COMMUNITY): Payer: Self-pay

## 2022-05-19 NOTE — Telephone Encounter (Signed)
Per test claims these are the covered alternatives at this time: *Patient does have a deductible to meet effecting these prices  Breo-$263.15 Wixela/generic Advair Diskus-$41.50 Advair HFA-$267.61 Dulera-$276.94

## 2022-05-20 NOTE — Telephone Encounter (Signed)
Patient is returning phone call. Patient out of town. Pharmacy is [facility] [place]. Patient phone number is [phone]. May leave detailed message on voicemail.

## 2022-05-20 NOTE — Telephone Encounter (Addendum)
Desiree Milch, MD  to Me     05/19/22  4:46 PM Wixela seems to work best please let her know reason for replacement -1 puff twice daily 100/50  I called the pt and there was no answer- LMTCB.

## 2022-05-23 MED ORDER — FLUTICASONE-SALMETEROL 100-50 MCG/ACT IN AEPB: 1.0000 | INHALATION_SPRAY | Freq: Two times a day (BID) | RESPIRATORY_TRACT | 1 refills | Status: AC

## 2022-05-23 NOTE — Telephone Encounter (Signed)
Called and spoke with patient. She verbalized understanding. [name] has been sent to patients pharmacy.   Nothing further needed.

## 2022-05-26 ENCOUNTER — Encounter (HOSPITAL_BASED_OUTPATIENT_CLINIC_OR_DEPARTMENT_OTHER): Payer: Medicare Other | Admitting: Internal Medicine

## 2022-05-26 DIAGNOSIS — T8131XA Disruption of external operation (surgical) wound, not elsewhere classified, initial encounter: Secondary | ICD-10-CM | POA: Diagnosis not present

## 2022-05-26 DIAGNOSIS — L97822 Non-pressure chronic ulcer of other part of left lower leg with fat layer exposed: Secondary | ICD-10-CM | POA: Diagnosis not present

## 2022-05-26 DIAGNOSIS — G14 Postpolio syndrome: Secondary | ICD-10-CM | POA: Diagnosis not present

## 2022-05-26 DIAGNOSIS — I890 Lymphedema, not elsewhere classified: Secondary | ICD-10-CM | POA: Diagnosis not present

## 2022-05-26 DIAGNOSIS — C541 Malignant neoplasm of endometrium: Secondary | ICD-10-CM | POA: Diagnosis not present

## 2022-05-26 DIAGNOSIS — C439 Malignant melanoma of skin, unspecified: Secondary | ICD-10-CM | POA: Diagnosis not present

## 2022-05-27 ENCOUNTER — Telehealth: Payer: Self-pay | Admitting: Pulmonary Disease

## 2022-05-27 DIAGNOSIS — J301 Allergic rhinitis due to pollen: Secondary | ICD-10-CM | POA: Diagnosis not present

## 2022-05-27 DIAGNOSIS — J3089 Other allergic rhinitis: Secondary | ICD-10-CM | POA: Diagnosis not present

## 2022-05-27 DIAGNOSIS — J3081 Allergic rhinitis due to animal (cat) (dog) hair and dander: Secondary | ICD-10-CM | POA: Diagnosis not present

## 2022-05-27 NOTE — Telephone Encounter (Signed)
CORRECTION THIS IS FOR DR. ALVA/DWBG:  Please see last signed encounter.  The Wixela we substituted for Symbicort is not working and she is becoming congested.  Pt is asking if we can call [facility] and adv them she needs the Symbicort because of her Polio/Post Polio Syndrome.   PT reviewed Polio Syndrome with Dr. Vassie Loll at her first meeting but as a reminder she was under an oxygen tent and the Polio affected her bronchial system.   Please call PT to advise @ [phone]

## 2022-06-01 ENCOUNTER — Encounter (HOSPITAL_BASED_OUTPATIENT_CLINIC_OR_DEPARTMENT_OTHER): Payer: Medicare Other | Admitting: Physician Assistant

## 2022-06-01 DIAGNOSIS — T8131XA Disruption of external operation (surgical) wound, not elsewhere classified, initial encounter: Secondary | ICD-10-CM | POA: Diagnosis not present

## 2022-06-01 DIAGNOSIS — I890 Lymphedema, not elsewhere classified: Secondary | ICD-10-CM | POA: Diagnosis not present

## 2022-06-01 DIAGNOSIS — C439 Malignant melanoma of skin, unspecified: Secondary | ICD-10-CM | POA: Diagnosis not present

## 2022-06-01 DIAGNOSIS — C541 Malignant neoplasm of endometrium: Secondary | ICD-10-CM | POA: Diagnosis not present

## 2022-06-01 DIAGNOSIS — G14 Postpolio syndrome: Secondary | ICD-10-CM | POA: Diagnosis not present

## 2022-06-01 DIAGNOSIS — L97822 Non-pressure chronic ulcer of other part of left lower leg with fat layer exposed: Secondary | ICD-10-CM | POA: Diagnosis not present

## 2022-06-01 NOTE — Telephone Encounter (Signed)
Attempted to call pt to get her scheduled for a f/u appt but unable to reach. Left her a detailed message to call the office to schedule appt.   In the meantime, routing to prior auth team to see if a prior auth can be done to try to get Symbicort approved.

## 2022-06-03 ENCOUNTER — Other Ambulatory Visit (HOSPITAL_COMMUNITY): Payer: Self-pay

## 2022-06-03 ENCOUNTER — Telehealth: Payer: Self-pay

## 2022-06-03 NOTE — Telephone Encounter (Signed)
*  Asthma/Allergy  PA request for Symbicort 80-4.5MCG/ACT aerosol received via provider  PA submitted to [employer] via CMM and is pending determination  *Wixela failed  *needs the Symbicort because of her Polio/Post Polio Syndrome   Key: [id number]

## 2022-06-03 NOTE — Telephone Encounter (Signed)
PA submitted and will be updated in additional encounter created. 

## 2022-06-06 NOTE — Telephone Encounter (Signed)
PA has been APPROVED through 06/03/2023. Approval letter has been attached in patients media.

## 2022-06-07 ENCOUNTER — Encounter (HOSPITAL_BASED_OUTPATIENT_CLINIC_OR_DEPARTMENT_OTHER): Payer: Medicare Other | Attending: Internal Medicine | Admitting: Internal Medicine

## 2022-06-07 DIAGNOSIS — G14 Postpolio syndrome: Secondary | ICD-10-CM | POA: Insufficient documentation

## 2022-06-07 DIAGNOSIS — L97822 Non-pressure chronic ulcer of other part of left lower leg with fat layer exposed: Secondary | ICD-10-CM | POA: Insufficient documentation

## 2022-06-07 DIAGNOSIS — T8131XA Disruption of external operation (surgical) wound, not elsewhere classified, initial encounter: Secondary | ICD-10-CM | POA: Diagnosis not present

## 2022-06-07 DIAGNOSIS — Z85820 Personal history of malignant melanoma of skin: Secondary | ICD-10-CM | POA: Insufficient documentation

## 2022-06-07 DIAGNOSIS — I10 Essential (primary) hypertension: Secondary | ICD-10-CM | POA: Insufficient documentation

## 2022-06-07 DIAGNOSIS — I890 Lymphedema, not elsewhere classified: Secondary | ICD-10-CM | POA: Insufficient documentation

## 2022-06-07 DIAGNOSIS — C541 Malignant neoplasm of endometrium: Secondary | ICD-10-CM | POA: Diagnosis not present

## 2022-06-07 DIAGNOSIS — J301 Allergic rhinitis due to pollen: Secondary | ICD-10-CM | POA: Diagnosis not present

## 2022-06-07 DIAGNOSIS — J3089 Other allergic rhinitis: Secondary | ICD-10-CM | POA: Diagnosis not present

## 2022-06-14 ENCOUNTER — Encounter (HOSPITAL_BASED_OUTPATIENT_CLINIC_OR_DEPARTMENT_OTHER): Payer: Medicare Other | Admitting: Internal Medicine

## 2022-06-14 DIAGNOSIS — L97822 Non-pressure chronic ulcer of other part of left lower leg with fat layer exposed: Secondary | ICD-10-CM

## 2022-06-14 DIAGNOSIS — Z85820 Personal history of malignant melanoma of skin: Secondary | ICD-10-CM | POA: Diagnosis not present

## 2022-06-14 DIAGNOSIS — T8131XA Disruption of external operation (surgical) wound, not elsewhere classified, initial encounter: Secondary | ICD-10-CM

## 2022-06-14 DIAGNOSIS — I10 Essential (primary) hypertension: Secondary | ICD-10-CM | POA: Diagnosis not present

## 2022-06-14 DIAGNOSIS — I890 Lymphedema, not elsewhere classified: Secondary | ICD-10-CM | POA: Diagnosis not present

## 2022-06-14 DIAGNOSIS — C541 Malignant neoplasm of endometrium: Secondary | ICD-10-CM | POA: Diagnosis not present

## 2022-06-14 DIAGNOSIS — G14 Postpolio syndrome: Secondary | ICD-10-CM | POA: Diagnosis not present

## 2022-06-14 NOTE — Progress Notes (Signed)
RENO, FONG ([id number]) [id number] Page 1 of 5 Visit Report for 05/09/2022 HPI Details Patient Name: Date of Service: [name], [name]. 05/09/2022 1:30 PM Medical Record Number: [id number] Patient Account Number: [id number] Date of Birth/Sex: Treating RN: 1950/05/14 (72 y.o. F) Primary Care Provider: Creola Corn Other Clinician: Referring Provider: Treating Provider/Extender: Lacretia Leigh in Treatment: 7 History of Present Illness HPI Description: 03/21/2022 Ms. [name] is a 72 year old female with a past medical history of postpolio syndrome, endometrial cancer and essential hypertension that presents the clinic for a 1 month history of nonhealing ulcer to the left lower extremity. She states that she had a melanoma removed from the left lower extremity on 2/27 by Dr. Irene Limbo. After the surgery the wound dehisced. They have been using compression wraps. It is unclear what dressing has been used. She has no issues or complaints today. She denies signs of infection. 3/25; Patient presents for follow-up. We have been using Santyl and Hydrofera Blue under Kerlix/Coban. There is been improvement in wound healing. She had no issues with the compression wrap. 4/8; patient presents for follow-up. We have been using Santyl Hydrofera Blue under Kerlix/Coban. Wound continues to improve in size and appearance. She still has slough buildup. I think in the near future she would do well with a skin substitute. We discussed this and patient was agreeable to proceed with insurance verification. We will Desiree ahead and run her for PuraPly and Grafix. 4/15; patient presents for follow-up. We have been using Santyl and Hydrofera Blue under Kerlix/Coban. The wound is smaller. She has been approved for PuraPly and she is agreeable to proceed with placement today. She denies signs of infection. 4/22; patient presents for follow-up. We placed PuraPly on the wound  bed at last clinic visit under Kerlix/Coban. Unfortunately the PuraPly dried out. She denies signs of infection. 4/29; patient presents for follow-up. We placed Grafix on the wound bed at last clinic visit under Kerlix/Coban. Again the wound bed appears dry. No signs of infection. 6/5; patient with a wound on her left anterior lower leg in the setting of Mohs surgery for a malignant melanoma. She has been using Santyl Hydrofera Blue kerlix Coban. She went for her arterial studies at [facility]. She was not able to tolerate an ABI however her TBI was 1.16 in the normal range and the left dorsalis pedis pulse waveform was within normal limits therefore she does not seem to have significant arterial issues. Her wound is measuring smaller Electronic Signature(s) Signed: 05/09/2022 4:51:59 PM By: Baltazar Najjar MD Entered By: Baltazar Najjar on 05/09/2022 14:13:16 -------------------------------------------------------------------------------- Physical Exam Details Patient Name: Date of Service: [name]. 05/09/2022 1:30 PM Medical Record Number: [id number] Patient Account Number: [id number] Date of Birth/Sex: Treating RN: 11/19/1950 (72 y.o. F) Primary Care Provider: Creola Corn Other Clinician: Referring Provider: Treating Provider/Extender: Lacretia Leigh in Treatment: 7 Constitutional Sitting or standing Blood Pressure is within target range for patient.. Pulse regular and within target range for patient.Marland Kitchen Respirations regular, non-labored and within target range.. Temperature is normal and within the target range for the patient.Marland Kitchen Appears in no distress. Notes Wound exam; left anterior lower extremity. Under illumination there is still some slough on the surface of the wound although the wound is measuring smaller and things really looks satisfactory. Therefore no mechanical debridement was done. No evidence of surrounding infection. Her edema control is  modest Electronic Signature(s) Signed: 05/09/2022 4:51:59 PM By: Baltazar Najjar  MD Entered By: Baltazar Najjar on 05/09/2022 14:14:52 Physician Orders Details -------------------------------------------------------------------------------- Eric Form ([id number]) [id number] Page 2 of 5 Patient Name: Date of Service: [name], [name]. 05/09/2022 1:30 PM Medical Record Number: [id number] Patient Account Number: [id number] Date of Birth/Sex: Treating RN: 10/11/1950 (72 y.o. Gevena Mart Primary Care Provider: Creola Corn Other Clinician: Referring Provider: Treating Provider/Extender: Lacretia Leigh in Treatment: 7 Verbal / Phone Orders: No Diagnosis Coding Follow-up Appointments ppointment in 1 week. - Dr. Mikey Bussing 05/16/2022 130pm Return A Anesthetic (In clinic) Topical Lidocaine 4% applied to wound bed - used in clinic Cellular or Tissue Based Products Wound #1 Left,Anterior Lower Leg Cellular or Tissue Based Product Type: - Puraply AM #1 applied 04/18/22 CHANGED TO Grafix 2x3 applied today 04/25/2022 Bathing/ Shower/ Hygiene May shower with protection but do not get wound dressing(s) wet. Protect dressing(s) with water repellant cover (for example, large plastic bag) or a cast cover and may then take shower. Edema Control - Lymphedema / SCD / Other Elevate legs to the level of the heart or above for 30 minutes daily and/or when sitting for 3-4 times a day throughout the day. Avoid standing for long periods of time. Exercise regularly - Walk/exercise a s tolerated throughout the day Moisturize legs daily. Wound Treatment Wound #1 - Lower Leg Wound Laterality: Left, Anterior Cleanser: Soap and Water 1 x Per Week/30 Days Discharge Instructions: May shower and wash wound with dial antibacterial soap and water prior to dressing change. Cleanser: Vashe 5.8 (oz) 1 x Per Week/30 Days Discharge Instructions: Cleanse the wound with Vashe  prior to applying a clean dressing using gauze sponges, not tissue or cotton balls. Peri-Wound Care: Sween Lotion (Moisturizing lotion) 1 x Per Week/30 Days Discharge Instructions: Apply moisturizing lotion as directed Prim Dressing: Hydrofera Blue Ready Transfer Foam, 2.5x2.5 (in/in) 1 x Per Week/30 Days ary Discharge Instructions: Apply directly over the santyl. Prim Dressing: Santyl Ointment 1 x Per Week/30 Days ary Discharge Instructions: Apply nickel thick amount to wound bed as instructed Secondary Dressing: Woven Gauze Sponge, Non-Sterile 4x4 in 1 x Per Week/30 Days Discharge Instructions: Apply over primary dressing as directed. Secured With: Coban Self-Adherent Wrap 4x5 (in/yd) 1 x Per Week/30 Days Discharge Instructions: Secure with Coban as directed. Secured With: [employer], 4.5x3.1 (in/yd) 1 x Per Week/30 Days Discharge Instructions: Secure with Kerlix as directed. Electronic Signature(s) Signed: 05/09/2022 4:51:59 PM By: Baltazar Najjar MD Signed: 06/14/2022 7:49:05 AM By: Brenton Grills Entered By: Brenton Grills on 05/09/2022 13:51:56 -------------------------------------------------------------------------------- Problem List Details Patient Name: Date of Service: [name]. 05/09/2022 1:30 PM Medical Record Number: [id number] Patient Account Number: [id number] Date of Birth/Sex: Treating RN: 1950-12-19 (72 y.o. Gevena Mart Primary Care Provider: Creola Corn Other Clinician: Referring Provider: Treating Provider/Extender: Lacretia Leigh in Treatment: [address] [place] Judie Petit ([id number]) [id number] Page 3 of 5 Active Problems ICD-10 Encounter Code Description Active Date MDM Diagnosis L97.822 Non-pressure chronic ulcer of other part of left lower leg with fat layer exposed3/18/2024 No Yes T81.31XA Disruption of external operation (surgical) wound, not elsewhere classified, 03/21/2022 No Yes initial  encounter C43.9 Malignant melanoma of skin, unspecified 03/21/2022 No Yes G14 Postpolio syndrome 03/21/2022 No Yes C54.1 Malignant neoplasm of endometrium 03/21/2022 No Yes I89.0 Lymphedema, not elsewhere classified 03/21/2022 No Yes Inactive Problems Resolved Problems Electronic Signature(s) Signed: 05/09/2022 4:51:59 PM By: Baltazar Najjar MD Entered By: Baltazar Najjar on 05/09/2022 14:11:39 -------------------------------------------------------------------------------- Progress Note Details Patient Name: Date  of Service: [name], [name] 05/09/2022 1:30 PM Medical Record Number: [id number] Patient Account Number: [id number] Date of Birth/Sex: Treating RN: 1950/04/26 (72 y.o. F) Primary Care Provider: Creola Corn Other Clinician: Referring Provider: Treating Provider/Extender: Lacretia Leigh in Treatment: 7 Subjective History of Present Illness (HPI) 03/21/2022 Ms. [name] is a 72 year old female with a past medical history of postpolio syndrome, endometrial cancer and essential hypertension that presents the clinic for a 1 month history of nonhealing ulcer to the left lower extremity. She states that she had a melanoma removed from the left lower extremity on 2/27 by Dr. Irene Limbo. After the surgery the wound dehisced. They have been using compression wraps. It is unclear what dressing has been used. She has no issues or complaints today. She denies signs of infection. 3/25; Patient presents for follow-up. We have been using Santyl and Hydrofera Blue under Kerlix/Coban. There is been improvement in wound healing. She had no issues with the compression wrap. 4/8; patient presents for follow-up. We have been using Santyl Hydrofera Blue under Kerlix/Coban. Wound continues to improve in size and appearance. She still has slough buildup. I think in the near future she would do well with a skin substitute. We discussed this and patient was agreeable to proceed  with insurance verification. We will Desiree ahead and run her for PuraPly and Grafix. 4/15; patient presents for follow-up. We have been using Santyl and Hydrofera Blue under Kerlix/Coban. The wound is smaller. She has been approved for PuraPly and she is agreeable to proceed with placement today. She denies signs of infection. 4/22; patient presents for follow-up. We placed PuraPly on the wound bed at last clinic visit under Kerlix/Coban. Unfortunately the PuraPly dried out. She denies signs of infection. 4/29; patient presents for follow-up. We placed Grafix on the wound bed at last clinic visit under Kerlix/Coban. Again the wound bed appears dry. No signs of infection. 6/5; patient with a wound on her left anterior lower leg in the setting of Mohs surgery for a malignant melanoma. She has been using Santyl Hydrofera Blue kerlix Coban. She went for her arterial studies at vein and vascular. She was not able to tolerate an ABI however her TBI was 1.16 in the normal range and the [name], [name] ([id number]) [id number] Page 4 of 5 left dorsalis pedis pulse waveform was within normal limits therefore she does not seem to have significant arterial issues. Her wound is measuring smaller Objective Constitutional Sitting or standing Blood Pressure is within target range for patient.. Pulse regular and within target range for patient.Marland Kitchen Respirations regular, non-labored and within target range.. Temperature is normal and within the target range for the patient.Marland Kitchen Appears in no distress. Vitals Time Taken: 1:36 PM, Height: 62 in, Weight: 148 lbs, BMI: 27.1, Temperature: 97.9 F, Pulse: 78 bpm, Respiratory Rate: 18 breaths/min, Blood Pressure: 133/83 mmHg. General Notes: Wound exam; left anterior lower extremity. Under illumination there is still some slough on the surface of the wound although the wound is measuring smaller and things really looks satisfactory. Therefore no  mechanical debridement was done. No evidence of surrounding infection. Her edema control is modest Integumentary (Hair, Skin) Wound #1 status is Open. Original cause of wound was Surgical Injury. The date acquired was: 03/01/2022. The wound has been in treatment 7 weeks. The wound is located on the Left,Anterior Lower Leg. The wound measures 3cm length x 1.5cm width x 0.1cm depth; 3.534cm^2 area and 0.353cm^3 volume. There is Fat Layer (Subcutaneous Tissue)  exposed. There is no tunneling or undermining noted. There is a medium amount of serosanguineous drainage noted. The wound margin is distinct with the outline attached to the wound base. There is small (1-33%) red, pink granulation within the wound bed. There is a large (67- 100%) amount of necrotic tissue within the wound bed including Eschar. The periwound skin appearance did not exhibit: Callus, Crepitus, Excoriation, Induration, Rash, Scarring, Dry/Scaly, Maceration, Atrophie Blanche, Cyanosis, Ecchymosis, Hemosiderin Staining, Mottled, Pallor, Rubor, Erythema. Assessment Active Problems ICD-10 Non-pressure chronic ulcer of other part of left lower leg with fat layer exposed Disruption of external operation (surgical) wound, not elsewhere classified, initial encounter Malignant melanoma of skin, unspecified Postpolio syndrome Malignant neoplasm of endometrium Lymphedema, not elsewhere classified Procedures Wound #1 Pre-procedure diagnosis of Wound #1 is a Dehisced Wound located on the Left,Anterior Lower Leg . There was a Three Layer Compression Therapy Procedure by Brenton Grills, RN. Post procedure Diagnosis Wound #1: Same as Pre-Procedure Plan Follow-up Appointments: Return Appointment in 1 week. - Dr. Mikey Bussing 05/16/2022 130pm Anesthetic: (In clinic) Topical Lidocaine 4% applied to wound bed - used in clinic Cellular or Tissue Based Products: Wound #1 Left,Anterior Lower Leg: Cellular or Tissue Based Product Type: - Puraply AM  #1 applied 04/18/22 CHANGED TO Grafix 2x3 applied today 04/25/2022 Bathing/ Shower/ Hygiene: May shower with protection but do not get wound dressing(s) wet. Protect dressing(s) with water repellant cover (for example, large plastic bag) or a cast cover and may then take shower. Edema Control - Lymphedema / SCD / Other: Elevate legs to the level of the heart or above for 30 minutes daily and/or when sitting for 3-4 times a day throughout the day. Avoid standing for long periods of time. Exercise regularly - Walk/exercise a s tolerated throughout the day Moisturize legs daily. WOUND #1: - Lower Leg Wound Laterality: Left, Anterior Cleanser: Soap and Water 1 x Per Week/30 Days Discharge Instructions: May shower and wash wound with dial antibacterial soap and water prior to dressing change. Cleanser: Vashe 5.8 (oz) 1 x Per Week/30 Days JOA, MADDUX ([id number]) [id number] Page 5 of 5 Discharge Instructions: Cleanse the wound with Vashe prior to applying a clean dressing using gauze sponges, not tissue or cotton balls. Peri-Wound Care: Sween Lotion (Moisturizing lotion) 1 x Per Week/30 Days Discharge Instructions: Apply moisturizing lotion as directed Prim Dressing: Hydrofera Blue Ready Transfer Foam, 2.5x2.5 (in/in) 1 x Per Week/30 Days ary Discharge Instructions: Apply directly over the santyl. Prim Dressing: Santyl Ointment 1 x Per Week/30 Days ary Discharge Instructions: Apply nickel thick amount to wound bed as instructed Secondary Dressing: Woven Gauze Sponge, Non-Sterile 4x4 in 1 x Per Week/30 Days Discharge Instructions: Apply over primary dressing as directed. Secured With: Coban Self-Adherent Wrap 4x5 (in/yd) 1 x Per Week/30 Days Discharge Instructions: Secure with Coban as directed. Secured With: [employer], 4.5x3.1 (in/yd) 1 x Per Week/30 Days Discharge Instructions: Secure with Kerlix as directed. 1. Continued with the Santyl Hydrofera Blue  under kerlix Coban 2. Although there was some debris on the surface of this I elected no mechanical debridement as the surface area had improved. I told her that that may need to change if the wound stalls. Electronic Signature(s) Signed: 05/11/2022 11:31:33 AM By: Shawn Stall RN, BSN Signed: 05/11/2022 11:42:05 AM By: Baltazar Najjar MD Previous Signature: 05/09/2022 4:51:59 PM Version By: Baltazar Najjar MD Entered By: Shawn Stall on 05/11/2022 11:28:52 -------------------------------------------------------------------------------- SuperBill Details Patient Name: Date of Service: Desiree David Stall. 05/09/2022 Medical  Record Number: [id number] Patient Account Number: [id number] Date of Birth/Sex: Treating RN: [birth year] (72 y.o. F) Primary Care Provider: Creola Corn Other Clinician: Referring Provider: Treating Provider/Extender: Lacretia Leigh in Treatment: 7 Diagnosis Coding ICD-10 Codes Code Description [phone] Non-pressure chronic ulcer of other part of left lower leg with fat layer exposed T81.31XA Disruption of external operation (surgical) wound, not elsewhere classified, initial encounter C43.9 Malignant melanoma of skin, unspecified G14 Postpolio syndrome C54.1 Malignant neoplasm of endometrium I89.0 Lymphedema, not elsewhere classified Facility Procedures : CPT4 Code: [id number] Description: (Facility Use Only) [phone] - APPLY MULTLAY COMPRS LWR LT LEG ICD-10 Diagnosis Description L97.822 Non-pressure chronic ulcer of other part of left lower leg with fat layer exposed Modifier: Quantity: 1 Physician Procedures : CPT4 Code Description Modifier [id number] - WC PHYS LEVEL 3 - EST PT ICD-10 Diagnosis Description L97.822 Non-pressure chronic ulcer of other part of left lower leg with fat layer exposed T81.31XA Disruption of external operation (surgical) wound,  not elsewhere classified, initial encounter C43.9 Malignant melanoma of skin,  unspecified Quantity: 1 Electronic Signature(s) Signed: 05/09/2022 4:51:59 PM By: Baltazar Najjar MD Signed: 06/14/2022 7:49:05 AM By: Brenton Grills Entered By: Brenton Grills on 05/09/2022 14:27:37

## 2022-06-14 NOTE — Progress Notes (Signed)
[name], [name] ([id number]) [id number] Page 1 of 6 Visit Report for 05/09/2022 Arrival Information Details Patient Name: Date of Service: GO BAYLYN, [name]. 05/09/2022 1:30 PM Medical Record Number: [id number] Patient Account Number: [id number] Date of Birth/Sex: Treating RN: 1950-02-02 (72 y.o. Gevena Mart Primary Care Angelynn Lemus: Creola Corn Other Clinician: Referring Pang Robers: Treating Latrise Bowland/Extender: Lacretia Leigh in Treatment: 7 Visit Information History Since Last Visit All ordered tests and consults were completed: Yes Patient Arrived: Crutches Added or deleted any medications: No Arrival Time: 13:35 Any new allergies or adverse reactions: No Accompanied By: self Had a fall or experienced change in No Transfer Assistance: None activities of daily living that may affect Patient Identification Verified: Yes risk of falls: Secondary Verification Process Completed: Yes Signs or symptoms of abuse/neglect since last visito No Patient Requires Transmission-Based Precautions: No Hospitalized since last visit: No Patient Has Alerts: No Implantable device outside of the clinic excluding No cellular tissue based products placed in the center since last visit: Has Dressing in Place as Prescribed: Yes Pain Present Now: No Electronic Signature(s) Signed: 06/14/2022 7:49:05 AM By: Brenton Grills Entered By: Brenton Grills on 05/09/2022 13:36:10 -------------------------------------------------------------------------------- Compression Therapy Details Patient Name: Date of Service: [name]. 05/09/2022 1:30 PM Medical Record Number: [id number] Patient Account Number: [id number] Date of Birth/Sex: Treating RN: 12/17/1950 (72 y.o. Gevena Mart Primary Care Ralpheal Zappone: Creola Corn Other Clinician: Referring Shakiyla Kook: Treating Cecillia Menees/Extender: Lacretia Leigh in Treatment: 7 Compression Therapy Performed for  Wound Assessment: Wound #1 Left,Anterior Lower Leg Performed By: Clinician Brenton Grills, RN Compression Type: Three Layer Post Procedure Diagnosis Same as Pre-procedure Electronic Signature(s) Signed: 06/14/2022 7:49:05 AM By: Brenton Grills Entered By: Brenton Grills on 05/09/2022 14:28:27 -------------------------------------------------------------------------------- Encounter Discharge Information Details Patient Name: Date of Service: [name]. 05/09/2022 1:30 PM Medical Record Number: [id number] Patient Account Number: [id number] Date of Birth/Sex: Treating RN: [birth year] (72 y.o. Gevena Mart Primary Care Lovelyn Sheeran: Creola Corn Other Clinician: Referring Joyce Leckey: Treating Bunnie Rehberg/Extender: Lacretia Leigh in Treatment: 7 Encounter Discharge Information Items Discharge Condition: Stable Ambulatory Status: Crutches Discharge Destination: Home Transportation: [address] ELPIDIA, GRAVELINE [name] ([id number]) [id number] Page 2 of 6 Accompanied By: self Schedule Follow-up Appointment: Yes Clinical Summary of Care: Patient Declined Electronic Signature(s) Signed: 06/14/2022 7:49:05 AM By: Brenton Grills Entered By: Brenton Grills on 05/09/2022 14:29:25 -------------------------------------------------------------------------------- Lower Extremity Assessment Details Patient Name: Date of Service: [name]. 05/09/2022 1:30 PM Medical Record Number: [id number] Patient Account Number: [id number] Date of Birth/Sex: Treating RN: Nov 17, 1950 (72 y.o. Gevena Mart Primary Care [name]: Creola Corn Other Clinician: Referring Terrea Bruster: Treating Awesome Jared/Extender: Lacretia Leigh in Treatment: 7 Edema Assessment Assessed: Kyra Searles: No] Franne Forts: No] Edema: [Left: Ye] [Right: s] Calf Left: Right: Point of Measurement: 27 cm From Medial Instep 32 cm Ankle Left: Right: Point of Measurement: 9 cm From Medial  Instep 22.5 cm Vascular Assessment Pulses: Dorsalis Pedis Palpable: [Left:Yes] Electronic Signature(s) Signed: 06/14/2022 7:49:05 AM By: Brenton Grills Entered By: Brenton Grills on 05/09/2022 13:44:31 -------------------------------------------------------------------------------- Multi Wound Chart Details Patient Name: Date of Service: [name]. 05/09/2022 1:30 PM Medical Record Number: [id number] Patient Account Number: [id number] Date of Birth/Sex: Treating RN: 03/16/1950 (72 y.o. F) Primary Care [name]: Creola Corn Other Clinician: Referring Brice Potteiger: Treating [name]/Extender: Lacretia Leigh in Treatment: 7 Vital Signs Height(in): 62 Pulse(bpm): 78 Weight(lbs): 148 Blood Pressure(mmHg): 133/83 Body Mass Index(BMI): 27.1 Temperature(F): 97.9  Respiratory Rate(breaths/min): 18 [1:Photos:] [N/A:N/A] Left, Anterior Lower Leg N/A N/A Wound Location: Surgical Injury N/A N/A Wounding Event: Dehisced Wound N/A N/A Primary Etiology: Asthma, Hypertension, Received N/A N/A Comorbid History: Chemotherapy, Received Radiation 03/01/2022 N/A N/A Date Acquired: 7 N/A N/A Weeks of Treatment: Open N/A N/A Wound Status: No N/A N/A Wound Recurrence: 3x1.5x0.1 N/A N/A Measurements L x W x D (cm) 3.534 N/A N/A A (cm) : rea 0.353 N/A N/A Volume (cm) : 78.10% N/A N/A % Reduction in Area: 78.10% N/A N/A % Reduction in Volume: Full Thickness Without Exposed N/A N/A Classification: Support Structures Medium N/A N/A Exudate A mount: Serosanguineous N/A N/A Exudate Type: red, brown N/A N/A Exudate Color: Distinct, outline attached N/A N/A Wound Margin: Small (1-33%) N/A N/A Granulation Amount: Red, Pink N/A N/A Granulation Quality: Large (67-100%) N/A N/A Necrotic Amount: Eschar N/A N/A Necrotic Tissue: Fat Layer (Subcutaneous Tissue): Yes N/A N/A Exposed Structures: Fascia: No Tendon: No Muscle: No Joint: No Bone: No Medium (34-66%)  N/A N/A Epithelialization: Excoriation: No N/A N/A Periwound Skin Texture: Induration: No Callus: No Crepitus: No Rash: No Scarring: No Maceration: No N/A N/A Periwound Skin Moisture: Dry/Scaly: No Atrophie Blanche: No N/A N/A Periwound Skin Color: Cyanosis: No Ecchymosis: No Erythema: No Hemosiderin Staining: No Mottled: No Pallor: No Rubor: No Treatment Notes Electronic Signature(s) Signed: 05/09/2022 4:51:59 PM By: Baltazar Najjar MD Entered By: Baltazar Najjar on 05/09/2022 14:11:54 -------------------------------------------------------------------------------- Multi-Disciplinary Care Plan Details Patient Name: Date of Service: [name]. 05/09/2022 1:30 PM Medical Record Number: [id number] Patient Account Number: [id number] Date of Birth/Sex: Treating RN: [birth year] (72 y.o. Gevena Mart Primary Care Bevan Disney: Creola Corn Other Clinician: Referring Liana Camerer: Treating Asal Teas/Extender: Lacretia Leigh in Treatment: 7 Active Inactive Wound/Skin Impairment Nursing Diagnoses: Impaired tissue integrity Goals: Patient/caregiver will verbalize understanding of skin care regimen Date Initiated: 03/21/2022 Target Resolution Date: 07/03/2022 ADELAINE, ROPPOLO ([id number]) [phone].pdf Page 4 of 6 Goal Status: Active Interventions: Assess ulceration(s) every visit Treatment Activities: Skin care regimen initiated : 03/21/2022 Notes: Electronic Signature(s) Signed: 06/14/2022 7:49:05 AM By: Brenton Grills Entered By: Brenton Grills on 05/09/2022 13:52:06 -------------------------------------------------------------------------------- Pain Assessment Details Patient Name: Date of Service: [name]. 05/09/2022 1:30 PM Medical Record Number: [id number] Patient Account Number: [id number] Date of Birth/Sex: Treating RN: 1950/08/11 (72 y.o. Gevena Mart Primary Care Miria Cappelli: Creola Corn Other Clinician: Referring  Trygg Mantz: Treating Herson Prichard/Extender: Lacretia Leigh in Treatment: 7 Active Problems Location of Pain Severity and Description of Pain Patient Has Paino No Site Locations Pain Management and Medication Current Pain Management: Electronic Signature(s) Signed: 06/14/2022 7:49:05 AM By: Brenton Grills Entered By: Brenton Grills on 05/09/2022 13:36:59 -------------------------------------------------------------------------------- Patient/Caregiver Education Details Patient Name: Date of Service: [name] 5/6/2024andnbsp1:30 PM Medical Record Number: [id number] Patient Account Number: [id number] Date of Birth/Gender: Treating RN: Feb 18, 1950 (72 y.o. Gevena Mart Primary Care Physician: Creola Corn Other Clinician: Referring Physician: Treating Physician/Extender: Lacretia Leigh in Treatment: 7 Education Assessment Education Provided To: Patient MEERAB, MASELLI ([id number]) [id number] Page 5 of 6 Education Topics Provided Wound/Skin Impairment: Methods: Explain/Verbal Responses: State content correctly Electronic Signature(s) Signed: 06/14/2022 7:49:05 AM By: Brenton Grills Entered By: Brenton Grills on 05/09/2022 13:52:33 -------------------------------------------------------------------------------- Wound Assessment Details Patient Name: Date of Service: [name]. 05/09/2022 1:30 PM Medical Record Number: [id number] Patient Account Number: [id number] Date of Birth/Sex: Treating RN: 02/27/1950 (72 y.o. Gevena Mart Primary Care Thalia Turkington: Creola Corn Other Clinician: Referring  Zayah Keilman: Treating Diamonds Lippard/Extender: Lacretia Leigh in Treatment: 7 Wound Status Wound Number: 1 Primary Dehisced Wound Etiology: Wound Location: Left, Anterior Lower Leg Wound Status: Open Wounding Event: Surgical Injury Notes: pt. had a Melanoma removed by Dr. Irene Limbo Date Acquired:  03/01/2022 Comorbid Asthma, Hypertension, Received Chemotherapy, Received Weeks Of Treatment: 7 History: Radiation Clustered Wound: No Photos Wound Measurements Length: (cm) 3 Width: (cm) 1.5 Depth: (cm) 0.1 Area: (cm) 3.534 Volume: (cm) 0.353 % Reduction in Area: 78.1% % Reduction in Volume: 78.1% Epithelialization: Medium (34-66%) Tunneling: No Undermining: No Wound Description Classification: Full Thickness Without Exposed Suppor Wound Margin: Distinct, outline attached Exudate Amount: Medium Exudate Type: Serosanguineous Exudate Color: red, brown t Structures Foul Odor After Cleansing: No Slough/Fibrino No Wound Bed Granulation Amount: Small (1-33%) Exposed Structure Granulation Quality: Red, Pink Fascia Exposed: No Necrotic Amount: Large (67-100%) Fat Layer (Subcutaneous Tissue) Exposed: Yes Necrotic Quality: Eschar Tendon Exposed: No Muscle Exposed: No Joint Exposed: No Bone Exposed: No Periwound Skin Texture Texture Color No Abnormalities Noted: No No Abnormalities Noted: No Callus: No [address]TERRENA, GARCED ([id number]) [id number] Page 6 of 6 Crepitus: No Cyanosis: No Excoriation: No Ecchymosis: No Induration: No Erythema: No Rash: No Hemosiderin Staining: No Scarring: No Mottled: No Pallor: No Moisture Rubor: No No Abnormalities Noted: No Dry / Scaly: No Maceration: No Electronic Signature(s) Signed: 06/14/2022 7:49:05 AM By: Brenton Grills Entered By: Brenton Grills on 05/09/2022 13:50:23 -------------------------------------------------------------------------------- Vitals Details Patient Name: Date of Service: [name]. 05/09/2022 1:30 PM Medical Record Number: [id number] Patient Account Number: [id number] Date of Birth/Sex: Treating RN: 10/15/1950 (72 y.o. Gevena Mart Primary Care Jahsir Rama: Creola Corn Other Clinician: Referring Ezequiel Macauley: Treating Makeyla Govan/Extender: Lacretia Leigh in Treatment: 7 Vital Signs Time Taken: 13:36 Temperature (F): 97.9 Height (in): 62 Pulse (bpm): 78 Weight (lbs): 148 Respiratory Rate (breaths/min): 18 Body Mass Index (BMI): 27.1 Blood Pressure (mmHg): 133/83 Reference Range: 80 - 120 mg / dl Electronic Signature(s) Signed: 06/14/2022 7:49:05 AM By: Brenton Grills Entered By: Brenton Grills on 05/09/2022 13:36:51

## 2022-06-15 DIAGNOSIS — J3081 Allergic rhinitis due to animal (cat) (dog) hair and dander: Secondary | ICD-10-CM | POA: Diagnosis not present

## 2022-06-15 DIAGNOSIS — J301 Allergic rhinitis due to pollen: Secondary | ICD-10-CM | POA: Diagnosis not present

## 2022-06-15 DIAGNOSIS — J3089 Other allergic rhinitis: Secondary | ICD-10-CM | POA: Diagnosis not present

## 2022-06-15 NOTE — Progress Notes (Signed)
SAVANNAHA, STONEROCK ([id number]) [id number] Page 1 of 7 Visit Report for 06/14/2022 Arrival Information Details Patient Name: Date of Service: [name], [name]. 06/14/2022 11:00 A M Medical Record Number: [id number] Patient Account Number: [id number] Date of Birth/Sex: Treating RN: [birth year] (72 y.o. F) Primary Care Regis Wiland: Creola Corn Other Clinician: Referring Reyan Helle: Treating Mikle Sternberg/Extender: Octavia Bruckner in Treatment: 12 Visit Information History Since Last Visit Added or deleted any medications: No Patient Arrived: Dan Humphreys Any new allergies or adverse reactions: No Arrival Time: 11:34 Had a fall or experienced change in No Accompanied By: self activities of daily living that may affect Transfer Assistance: None risk of falls: Patient Identification Verified: Yes Signs or symptoms of abuse/neglect since last visito No Secondary Verification Process Completed: Yes Hospitalized since last visit: No Patient Requires Transmission-Based Precautions: No Implantable device outside of the clinic excluding No Patient Has Alerts: No cellular tissue based products placed in the center since last visit: Has Dressing in Place as Prescribed: Yes Has Compression in Place as Prescribed: Yes Pain Present Now: No Electronic Signature(s) Signed: 06/14/2022 4:51:19 PM By: Thayer Dallas Entered By: Thayer Dallas on 06/14/2022 11:36:26 -------------------------------------------------------------------------------- Encounter Discharge Information Details Patient Name: Date of Service: Desiree Kins M. 06/14/2022 11:00 A M Medical Record Number: [id number] Patient Account Number: [id number] Date of Birth/Sex: Treating RN: Feb 04, 1950 (72 y.o. [name] Primary Care Faustina Gebert: Creola Corn Other Clinician: Referring Jamelyn Bovard: Treating Lorretta Kerce/Extender: Octavia Bruckner in Treatment: 12 Encounter Discharge Information  Items Post Procedure Vitals Discharge Condition: Stable Temperature (F): 97.8 Ambulatory Status: Walker Pulse (bpm): 73 Discharge Destination: Home Respiratory Rate (breaths/min): 18 Transportation: Private Auto Blood Pressure (mmHg): 127/86 Accompanied By: husband Schedule Follow-up Appointment: Yes Clinical Summary of Care: Patient Declined Electronic Signature(s) Signed: 06/14/2022 4:49:20 PM By: Redmond Pulling RN, BSN Entered By: Redmond Pulling on 06/14/2022 12:21:11 -------------------------------------------------------------------------------- Lower Extremity Assessment Details Patient Name: Date of Service: Desiree Congress. 06/14/2022 11:00 A M Medical Record Number: [id number] Patient Account Number: [id number] Date of Birth/Sex: Treating RN: 06-08-1950 (72 y.o. F) Primary Care Alicia Ackert: Creola Corn Other Clinician: Referring Wes Lezotte: Treating Walta Bellville/Extender: Octavia Bruckner in Treatment: 12 Edema Assessment G[Left: Noralee Space ([id number])] [Right: [id number] Page 2 of 7] Assessed: [Left: No] [Right: No] Edema: [Left: N] [Right: o] Calf Left: Right: Point of Measurement: 27 cm From Medial Instep 33 cm Ankle Left: Right: Point of Measurement: 9 cm From Medial Instep 26 cm Electronic Signature(s) Signed: 06/14/2022 4:51:19 PM By: Thayer Dallas Entered By: Thayer Dallas on 06/14/2022 11:41:17 -------------------------------------------------------------------------------- Multi Wound Chart Details Patient Name: Date of Service: Desiree Kins M. 06/14/2022 11:00 A M Medical Record Number: [id number] Patient Account Number: [id number] Date of Birth/Sex: Treating RN: 12/30/1950 (72 y.o. F) Primary Care Antoninette Lerner: Creola Corn Other Clinician: Referring Emilygrace Grothe: Treating Nickole Adamek/Extender: Octavia Bruckner in Treatment: 12 Vital Signs Height(in): 62 Pulse(bpm): 73 Weight(lbs): 148 Blood  Pressure(mmHg): 127/86 Body Mass Index(BMI): 27.1 Temperature(F): 97.8 Respiratory Rate(breaths/min): 18 [1:Photos:] [N/A:N/A] Left, Anterior Lower Leg N/A N/A Wound Location: Surgical Injury N/A N/A Wounding Event: Dehisced Wound N/A N/A Primary Etiology: Asthma, Hypertension, Received N/A N/A Comorbid History: Chemotherapy, Received Radiation 03/01/2022 N/A N/A Date Acquired: 12 N/A N/A Weeks of Treatment: Open N/A N/A Wound Status: No N/A N/A Wound Recurrence: 0.9x0.3x0.1 N/A N/A Measurements L x W x D (cm) 0.212 N/A N/A A (cm) : rea 0.021 N/A N/A Volume (cm) : 98.70% N/A N/A % Reduction in  Area: 98.70% N/A N/A % Reduction in Volume: Full Thickness Without Exposed N/A N/A Classification: Support Structures Medium N/A N/A Exudate Amount: Serosanguineous N/A N/A Exudate Type: red, brown N/A N/A Exudate Color: Distinct, outline attached N/A N/A Wound Margin: Large (67-100%) N/A N/A Granulation Amount: Red, Pink N/A N/A Granulation Quality: None Present (0%) N/A N/A Necrotic Amount: Fat Layer (Subcutaneous Tissue): Yes N/A N/A Exposed Structures: Fascia: No Tendon: No Muscle: No Joint: No Bone: No Large (67-100%) N/A N/A Epithelialization: Debridement - Selective/Open Wound N/A N/A Debridement: AARNA, INGERSON ([id number]) [id number] Page 3 of 7 11:45 N/A N/A Pre-procedure Verification/Time Out Taken: Lidocaine 5% topical ointment N/A N/A Pain Control: Slough N/A N/A Tissue Debrided: Skin/Epidermis N/A N/A Level: 0.21 N/A N/A Debridement A (sq cm): rea Curette N/A N/A Instrument: Minimum N/A N/A Bleeding: Pressure N/A N/A Hemostasis A chieved: 0 N/A N/A Procedural Pain: 0 N/A N/A Post Procedural Pain: Procedure was tolerated well N/A N/A Debridement Treatment Response: 0.9x0.3x0.1 N/A N/A Post Debridement Measurements L x W x D (cm) 0.021 N/A N/A Post Debridement Volume: (cm) Excoriation: No N/A  N/A Periwound Skin Texture: Induration: No Callus: No Crepitus: No Rash: No Scarring: No Maceration: No N/A N/A Periwound Skin Moisture: Dry/Scaly: No Atrophie Blanche: No N/A N/A Periwound Skin Color: Cyanosis: No Ecchymosis: No Erythema: No Hemosiderin Staining: No Mottled: No Pallor: No Rubor: No No Abnormality N/A N/A Temperature: Debridement N/A N/A Procedures Performed: Treatment Notes Wound #1 (Lower Leg) Wound Laterality: Left, Anterior Cleanser Soap and Water Discharge Instruction: May shower and wash wound with dial antibacterial soap and water prior to dressing change. Vashe 5.8 (oz) Discharge Instruction: Cleanse the wound with Vashe prior to applying a clean dressing using gauze sponges, not tissue or cotton balls. Peri-Wound Care Sween Lotion (Moisturizing lotion) Discharge Instruction: Apply moisturizing lotion as directed Topical Skintegrity Hydrogel 4 (oz) Discharge Instruction: Apply hydrogel as directed Primary Dressing Promogran Prisma Matrix, 4.34 (sq in) (silver collagen) Discharge Instruction: Moisten collagen with saline or hydrogel Secondary Dressing Secured With Coban Self-Adherent Wrap 4x5 (in/yd) Discharge Instruction: Secure with Coban as directed. Kerlix Roll Sterile, 4.5x3.1 (in/yd) Discharge Instruction: Secure with Kerlix as directed. Compression Wrap Compression Stockings Add-Ons Electronic Signature(s) Signed: 06/14/2022 3:34:20 PM By: Geralyn Corwin DO Entered By: Geralyn Corwin on 06/14/2022 12:35:15 [name] ([id number]) [id number] Page 4 of 7 -------------------------------------------------------------------------------- Multi-Disciplinary Care Plan Details Patient Name: Date of Service: [name], RUOTOLO. 06/14/2022 11:00 A M Medical Record Number: [id number] Patient Account Number: [id number] Date of Birth/Sex: Treating RN: [birth year] (72 y.o. [name] Primary Care Antoinne Spadaccini:  Creola Corn Other Clinician: Referring Jisele Price: Treating Cesare Sumlin/Extender: Octavia Bruckner in Treatment: 12 Active Inactive Wound/Skin Impairment Nursing Diagnoses: Impaired tissue integrity Goals: Patient/caregiver will verbalize understanding of skin care regimen Date Initiated: 03/21/2022 Target Resolution Date: 07/03/2022 Goal Status: Active Interventions: Assess ulceration(s) every visit Treatment Activities: Skin care regimen initiated : 03/21/2022 Notes: Electronic Signature(s) Signed: 06/14/2022 4:49:20 PM By: Redmond Pulling RN, BSN Entered By: Redmond Pulling on 06/14/2022 12:19:34 -------------------------------------------------------------------------------- Pain Assessment Details Patient Name: Date of Service: Desiree Congress. 06/14/2022 11:00 A M Medical Record Number: [id number] Patient Account Number: [id number] Date of Birth/Sex: Treating RN: [birth year] (72 y.o. F) Primary Care Ayanni Tun: Creola Corn Other Clinician: Referring Danyal Whitenack: Treating Deion Forgue/Extender: Octavia Bruckner in Treatment: 12 Active Problems Location of Pain Severity and Description of Pain Patient Has Paino No Site Locations Pain Management and Medication Current Pain Management: [name], [name] ([id number]) [id number]  Page 5 of 7 Electronic Signature(s) Signed: 06/14/2022 4:51:19 PM By: Thayer Dallas Entered By: Thayer Dallas on 06/14/2022 11:37:47 -------------------------------------------------------------------------------- Patient/Caregiver Education Details Patient Name: Date of Service: Desiree Congress 6/11/2024andnbsp11:00 A M Medical Record Number: [id number] Patient Account Number: [id number] Date of Birth/Gender: Treating RN: Nov 29, 1950 (72 y.o. [name] Primary Care Physician: Creola Corn Other Clinician: Referring Physician: Treating Physician/Extender: Octavia Bruckner in  Treatment: 12 Education Assessment Education Provided To: Patient Education Topics Provided Wound/Skin Impairment: Methods: Explain/Verbal Responses: State content correctly [employer]) Signed: 06/14/2022 4:49:20 PM By: Redmond Pulling RN, BSN Entered By: Redmond Pulling on 06/14/2022 12:19:47 -------------------------------------------------------------------------------- Wound Assessment Details Patient Name: Date of Service: Desiree Congress. 06/14/2022 11:00 A M Medical Record Number: [id number] Patient Account Number: [id number] Date of Birth/Sex: Treating RN: February 24, 1950 (72 y.o. F) Primary Care Jaimin Krupka: Creola Corn Other Clinician: Referring Dyllen Menning: Treating Becker Christopher/Extender: Octavia Bruckner in Treatment: 12 Wound Status Wound Number: 1 Primary Dehisced Wound Etiology: Wound Location: Left, Anterior Lower Leg Wound Status: Open Wounding Event: Surgical Injury Notes: pt. had a Melanoma removed by Dr. Irene Limbo Date Acquired: 03/01/2022 Comorbid Asthma, Hypertension, Received Chemotherapy, Received Weeks Of Treatment: 12 History: Radiation Clustered Wound: No Photos Wound Measurements Length: (cm) 0.9 Width: (cm) 0.3 Depth: (cm) 0.1 Area: (cm) 0.212 Volume: (cm) 0.021 [name], [name] ([id number]) Wound Description Classification: Full Thickness Without Exposed Support Structures Wound Margin: Distinct, outline attached Exudate Amount: Medium Exudate Type: Serosanguineous Exudate Color: red, brown Foul Odor After Cleansing: No Slough/Fibrino No % Reduction in Area: 98.7% % Reduction in Volume: 98.7% Epithelialization: Large (67-100%) Tunneling: No Undermining: No [id number] Page 6 of 7 Wound Bed Granulation Amount: Large (67-100%) Exposed Structure Granulation Quality: Red, Pink Fascia Exposed: No Necrotic Amount: None Present (0%) Fat Layer (Subcutaneous Tissue) Exposed: Yes Tendon Exposed:  No Muscle Exposed: No Joint Exposed: No Bone Exposed: No Periwound Skin Texture Texture Color No Abnormalities Noted: No No Abnormalities Noted: No Callus: No Atrophie Blanche: No Crepitus: No Cyanosis: No Excoriation: No Ecchymosis: No Induration: No Erythema: No Rash: No Hemosiderin Staining: No Scarring: No Mottled: No Pallor: No Moisture Rubor: No No Abnormalities Noted: No Dry / Scaly: No Temperature / Pain Maceration: No Temperature: No Abnormality Treatment Notes Wound #1 (Lower Leg) Wound Laterality: Left, Anterior Cleanser Soap and Water Discharge Instruction: May shower and wash wound with dial antibacterial soap and water prior to dressing change. Vashe 5.8 (oz) Discharge Instruction: Cleanse the wound with Vashe prior to applying a clean dressing using gauze sponges, not tissue or cotton balls. Peri-Wound Care Sween Lotion (Moisturizing lotion) Discharge Instruction: Apply moisturizing lotion as directed Topical Skintegrity Hydrogel 4 (oz) Discharge Instruction: Apply hydrogel as directed Primary Dressing Promogran Prisma Matrix, 4.34 (sq in) (silver collagen) Discharge Instruction: Moisten collagen with saline or hydrogel Secondary Dressing Secured With Coban Self-Adherent Wrap 4x5 (in/yd) Discharge Instruction: Secure with Coban as directed. Kerlix Roll Sterile, 4.5x3.1 (in/yd) Discharge Instruction: Secure with Kerlix as directed. Compression Wrap Compression Stockings Add-Ons Electronic Signature(s) Signed: 06/14/2022 4:51:19 PM By: Thayer Dallas Entered By: Thayer Dallas on 06/14/2022 11:44:44 [name] ([id number]) [id number] Page 7 of 7 -------------------------------------------------------------------------------- Vitals Details Patient Name: Date of Service: [name], [name]. 06/14/2022 11:00 A M Medical Record Number: [id number] Patient Account Number: [id number] Date of Birth/Sex: Treating  RN: 02-Dec-1950 (72 y.o. F) Primary Care Steffani Dionisio: Creola Corn Other Clinician: Referring Nyko Gell: Treating Javanna Patin/Extender: Octavia Bruckner in Treatment: 12 Vital Signs Time  Taken: 11:36 Temperature (F): 97.8 Height (in): 62 Pulse (bpm): 73 Weight (lbs): 148 Respiratory Rate (breaths/min): 18 Body Mass Index (BMI): 27.1 Blood Pressure (mmHg): 127/86 Reference Range: 80 - 120 mg / dl Electronic Signature(s) Signed: 06/14/2022 4:51:19 PM By: Thayer Dallas Entered By: Thayer Dallas on 06/14/2022 11:37:18

## 2022-06-17 ENCOUNTER — Other Ambulatory Visit: Payer: Self-pay | Admitting: Pulmonary Disease

## 2022-06-21 ENCOUNTER — Encounter (HOSPITAL_BASED_OUTPATIENT_CLINIC_OR_DEPARTMENT_OTHER): Payer: Medicare Other | Admitting: Internal Medicine

## 2022-06-22 DIAGNOSIS — J3081 Allergic rhinitis due to animal (cat) (dog) hair and dander: Secondary | ICD-10-CM | POA: Diagnosis not present

## 2022-06-22 DIAGNOSIS — J3089 Other allergic rhinitis: Secondary | ICD-10-CM | POA: Diagnosis not present

## 2022-06-22 DIAGNOSIS — J301 Allergic rhinitis due to pollen: Secondary | ICD-10-CM | POA: Diagnosis not present

## 2022-06-28 ENCOUNTER — Ambulatory Visit (HOSPITAL_BASED_OUTPATIENT_CLINIC_OR_DEPARTMENT_OTHER): Payer: Medicare Other | Admitting: Internal Medicine

## 2022-06-30 ENCOUNTER — Inpatient Hospital Stay: Payer: Medicare Other

## 2022-06-30 ENCOUNTER — Other Ambulatory Visit: Payer: Self-pay

## 2022-06-30 ENCOUNTER — Inpatient Hospital Stay: Payer: Medicare Other | Attending: Gynecologic Oncology | Admitting: Gynecologic Oncology

## 2022-06-30 ENCOUNTER — Encounter: Payer: Medicare Other | Admitting: Gynecologic Oncology

## 2022-06-30 ENCOUNTER — Encounter: Payer: Self-pay | Admitting: Gynecologic Oncology

## 2022-06-30 VITALS — BP 144/78 | HR 80 | Temp 98.3°F | Ht 62.99 in | Wt 155.0 lb

## 2022-06-30 DIAGNOSIS — Z90722 Acquired absence of ovaries, bilateral: Secondary | ICD-10-CM | POA: Diagnosis not present

## 2022-06-30 DIAGNOSIS — C541 Malignant neoplasm of endometrium: Secondary | ICD-10-CM

## 2022-06-30 DIAGNOSIS — Z923 Personal history of irradiation: Secondary | ICD-10-CM | POA: Insufficient documentation

## 2022-06-30 DIAGNOSIS — Z8542 Personal history of malignant neoplasm of other parts of uterus: Secondary | ICD-10-CM

## 2022-06-30 DIAGNOSIS — Z90710 Acquired absence of both cervix and uterus: Secondary | ICD-10-CM | POA: Insufficient documentation

## 2022-06-30 DIAGNOSIS — N952 Postmenopausal atrophic vaginitis: Secondary | ICD-10-CM | POA: Insufficient documentation

## 2022-06-30 MED ORDER — HYDROMORPHONE HCL 1 MG/ML IJ SOLN: 0.5000 mg | Freq: Once | INTRAMUSCULAR | Status: DC

## 2022-06-30 MED ORDER — ONDANSETRON HCL 4 MG PO TABS: 4.0000 mg | ORAL_TABLET | Freq: Once | ORAL | Status: DC

## 2022-06-30 MED ORDER — HYDROMORPHONE HCL 1 MG/ML IJ SOLN
0.5000 mg | INTRAMUSCULAR | Status: AC
  Filled 2022-06-30: qty 1

## 2022-06-30 MED ORDER — ONDANSETRON HCL 8 MG PO TABS
4.0000 mg | ORAL_TABLET | Freq: Once | ORAL | Status: AC
  Administered 2022-06-30: 4 mg via ORAL

## 2022-06-30 MED ORDER — ONDANSETRON HCL 8 MG PO TABS: 4.0000 mg | ORAL_TABLET | Freq: Once | ORAL | Status: DC

## 2022-06-30 MED ORDER — HYDROMORPHONE HCL 1 MG/ML IJ SOLN
0.5000 mg | Freq: Once | INTRAMUSCULAR | Status: AC
  Administered 2022-06-30: 0.5 mg via INTRAMUSCULAR

## 2022-06-30 NOTE — Patient Instructions (Signed)
It was good to see you today.  I do not see or feel any evidence of cancer recurrence on your exam.  I will see you for follow-up in 6 months.  As always, if you develop any new and concerning symptoms before your next visit, please call to see me sooner.   

## 2022-06-30 NOTE — Progress Notes (Signed)
Gynecologic Oncology Return Clinic Visit  06/30/22  Reason for Visit: Surveillance visit in the setting of high intermediate risk uterine cancer   Treatment History: Oncology History Overview Note  MSI-stable   Endometrial cancer (HCC)  02/27/2019 Initial Biopsy   Gr1 EMC on EMB   02/27/2019 Initial Diagnosis   Endometrial cancer (HCC)   03/05/2019 Imaging   Pelvic ultrasound: Uterus measures 7.9 x 5.7 x 3.7 cm with an endometrial lining of 2.9 cm.  Bilateral ovaries normal in size and shape.   03/20/2019 Surgery   Lsc LOA, TRH/BSO, SLN on R, left pelvic LND   03/20/2019 Cancer Staging   Staging form: Corpus Uteri - Carcinoma and Carcinosarcoma, AJCC 8th Edition - Clinical stage from 03/20/2019: FIGO Stage IB (cT1b, cN0, cM0) - Signed by Carver Fila, MD on 03/27/2019   05/06/2019 - 06/04/2019 Radiation Therapy   Site Technique Total Dose (Gy) Dose per Fx (Gy) Completed Fx Beam Energies  Vagina: Pelvis HDR-brachy 30/30 6 5/5 Ir-192     03/09/2020 Genetic Testing   Negative genetic testing on the CancerNext-Expanded+RNAinsight panel.  The CancerNext-Expanded gene panel offered by [employer] and includes sequencing and rearrangement analysis for the following 77 genes: AIP, ALK, APC*, ATM*, AXIN2, BAP1, BARD1, BLM, BMPR1A, BRCA1*, BRCA2*, BRIP1*, CDC73, CDH1*, CDK4, CDKN1B, CDKN2A, CHEK2*, CTNNA1, DICER1, FANCC, FH, FLCN, GALNT12, KIF1B, LZTR1, MAX, MEN1, MET, MLH1*, MSH2*, MSH3, MSH6*, MUTYH*, NBN, NF1*, NF2, NTHL1, PALB2*, PHOX2B, PMS2*, POT1, PRKAR1A, PTCH1, PTEN*, RAD51C*, RAD51D*, RB1, RECQL, RET, SDHA, SDHAF2, SDHB, SDHC, SDHD, SMAD4, SMARCA4, SMARCB1, SMARCE1, STK11, SUFU, TMEM127, TP53*, TSC1, TSC2, VHL and XRCC2 (sequencing and deletion/duplication); EGFR, EGLN1, HOXB13, KIT, MITF, PDGFRA, POLD1, and POLE (sequencing only); EPCAM and GREM1 (deletion/duplication only). DNA and RNA analyses performed for * genes. The report date was March 09, 2020.     Interval History: Doing  well.  Denies any vaginal bleeding although notes very minimal amount of brown on her dilator after she uses it, which she has been doing once a week.  Reports good bowel function.  Denies any urinary symptoms.  Denies any abdominal or pelvic pain.  Had surgery for melanoma on her left leg recently.  Ex-husband recently passed.  Past Medical/Surgical History: Past Medical History:  Diagnosis Date   Allergic rhinitis    Arthritis    Asthma    Carpal tunnel syndrome on right    Endometrial cancer (HCC)    Family history of breast cancer    Family history of colon cancer    Family history of kidney cancer    Family history of stomach cancer    GERD (gastroesophageal reflux disease)    History of radiation therapy 06/04/2019   vaginal brachytherapy 05/06/2019-06/04/2019   Dr Antony Blackbird   Hyperlipemia    PMB (postmenopausal bleeding)    Polio    Left leg   Unspecified essential hypertension    clearance Dr Everardo Beals with note on chart    Past Surgical History:  Procedure Laterality Date   BREAST CYST ASPIRATION Right 2004   BREAST EXCISIONAL BIOPSY Bilateral    BREAST SURGERY     bilateral fibroid tumors removed   CARPAL TUNNEL RELEASE     right   CESAREAN SECTION     x 2   COLON SURGERY     scar tissue removed   COLONOSCOPY     leg surgery     bilateral, numerous   LYMPH NODE DISSECTION N/A 03/20/2019   Procedure: LYMPH NODE DISSECTION;  Surgeon: Pricilla Holm,  Carmelina Peal, MD;  Location: [facility];  Service: Gynecology;  Laterality: N/A;   ROBOTIC ASSISTED TOTAL HYSTERECTOMY WITH BILATERAL SALPINGO OOPHERECTOMY N/A 03/20/2019   Procedure: XI ROBOTIC ASSISTED TOTAL HYSTERECTOMY WITH BILATERAL SALPINGO OOPHORECTOMY;  Surgeon: Carver Fila, MD;  Location: [facility];  Service: Gynecology;  Laterality: N/A;   SENTINEL NODE BIOPSY  03/20/2019   Procedure: SENTINEL NODE BIOPSY;  Surgeon: Carver Fila, MD;  Location: [facility];  Service: Gynecology;;   tooth implant     titanium/ front upper right   TOTAL HIP ARTHROPLASTY     right   TOTAL HIP REVISION  08/15/2011   Procedure: TOTAL HIP REVISION;  Surgeon: Shelda Pal, MD;  Location: [facility];  Service: Orthopedics;  Laterality: Right;   vocal surgery     cyst removed   WRIST SURGERY  2009   left    Family History  Problem Relation Age of Onset   Kidney cancer Father 86   CAD Father    Bladder Cancer Father 51       ureter   Melanoma Mother 59       STAGE 3   Breast cancer Mother 57   Heart attack Mother    Colon cancer Maternal Grandfather    Breast cancer Maternal Grandmother        meta to stomach   Breast cancer Maternal Aunt 78        bilateral   Stomach cancer Paternal Grandmother 96   Breast cancer Maternal Aunt 45   Non-Hodgkin's lymphoma Cousin    Hodgkin's lymphoma Son    Rectal cancer Neg Hx     Social History   Socioeconomic History   Marital status: Married    Spouse name: Not on file   Number of children: 2   Years of education: Not on file   Highest education level: Not on file  Occupational History   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: [employer]  Tobacco Use   Smoking status: Never   Smokeless tobacco: Never  Vaping Use   Vaping Use: Never used  Substance and Sexual Activity   Alcohol use: Yes    Alcohol/week: 2.0 standard drinks of alcohol    Types: 1 Glasses of wine, 1 Cans of beer per week   Drug use: No   Sexual activity: Not Currently    Birth control/protection: Post-menopausal  Other Topics Concern   Not on file  Social History Narrative   Not on file   Social Determinants of Health   Financial Resource Strain: Not on file  Food Insecurity: Not on file  Transportation Needs: Not on file  Physical Activity: Not on file  Stress: Not on file  Social Connections: Not on file    Current Medications:  Current Outpatient Medications:     albuterol (PROVENTIL) (2.5 MG/3ML) 0.083% nebulizer solution, Take 3 mLs (2.5 mg total) by nebulization every 6 (six) hours as needed for wheezing or shortness of breath., Disp: 75 vial, Rfl: 5   amLODipine (NORVASC) 2.5 MG tablet, Take 2.5 mg by mouth daily., Disp: , Rfl:    Ca Phosphate-Cholecalciferol (CALCIUM/VITAMIN D3 GUMMIES) 250-350 MG-UNIT CHEW, Chew 3 tablets by mouth daily., Disp: , Rfl:    cholecalciferol (VITAMIN D3) 25 MCG (1000 UNIT) tablet, Take by mouth once., Disp: , Rfl:    Cyanocobalamin (VITAMIN B-12 CR) 1500 MCG TBCR, Take 1,000 mcg by mouth daily. ,  Disp: , Rfl:    EPINEPHrine 0.3 mg/0.3 mL IJ SOAJ injection, , Disp: , Rfl:    fluticasone-salmeterol (WIXELA INHUB) 100-50 MCG/ACT AEPB, Inhale 1 puff into the lungs 2 (two) times daily., Disp: 60 each, Rfl: 1   loratadine (CLARITIN) 10 MG tablet, Take 10 mg by mouth daily., Disp: , Rfl:    losartan-hydrochlorothiazide (HYZAAR) 100-25 MG per tablet, Take 1 tablet by mouth daily., Disp: , Rfl:    montelukast (SINGULAIR) 10 MG tablet, TAKE ONE TABLET BY MOUTH AT BEDTIME, Disp: 30 tablet, Rfl: 3   Multiple Vitamin (MULTIVITAMIN WITH MINERALS) TABS tablet, Take 1 tablet by mouth daily., Disp: , Rfl:    omeprazole (PRILOSEC) 20 MG capsule, Take 1 capsule (20 mg total) by mouth daily., Disp: 30 capsule, Rfl: 5   OVER THE COUNTER MEDICATION, Take 0.5 drops by mouth at bedtime. CBD Oil, 1/2 drop under the tongue nightly, Disp: , Rfl:    SYMBICORT 80-4.5 MCG/ACT inhaler, Inhale 1 puff into the lungs in the morning and at bedtime., Disp: 10.2 g, Rfl: 4 No current facility-administered medications for this visit.  Facility-Administered Medications Ordered in Other Visits:    HYDROmorphone (DILAUDID) injection 0.5 mg, 0.5 mg, Intramuscular, Once, Carver Fila, MD   HYDROmorphone (DILAUDID) injection 0.5 mg, 0.5 mg, Intramuscular, STAT, Carver Fila, MD   ondansetron [facility]) tablet 4 mg, 4 mg, Oral, Once, Carver Fila,  MD  Review of Systems: Denies appetite changes, fevers, chills, fatigue, unexplained weight changes. Denies hearing loss, neck lumps or masses, mouth sores, ringing in ears or voice changes. Denies cough or wheezing.  Denies shortness of breath. Denies chest pain or palpitations. Denies leg swelling. Denies abdominal distention, pain, blood in stools, constipation, diarrhea, nausea, vomiting, or early satiety. Denies pain with intercourse, dysuria, frequency, hematuria or incontinence. Denies hot flashes, pelvic pain, vaginal bleeding or vaginal discharge.   Denies joint pain, back pain or muscle pain/cramps. Denies itching, rash, or wounds. Denies dizziness, headaches, numbness or seizures. Denies swollen lymph nodes or glands, denies easy bruising or bleeding. Denies anxiety, depression, confusion, or decreased concentration.  Physical Exam: BP (!) 144/78 (BP Location: Left Arm, Patient Position: Sitting)   Pulse 80   Temp 98.3 F (36.8 C) (Oral)   Ht 5' 2.99" (1.6 m)   Wt 155 lb (70.3 kg)   SpO2 98%   BMI 27.46 kg/m  General: Alert, oriented, no acute distress. HEENT: Normocephalic, atraumatic, sclera anicteric. Chest: Clear to auscultation bilaterally.  No inspiratory or expiratory wheezing appreciated. Cardiovascular: Regular rate and rhythm, no murmurs. Abdomen: soft, nontender.  Normoactive bowel sounds.  No masses or hepatosplenomegaly appreciated.  Well-healed incisions. Extremities: Grossly normal range of motion although somewhat decreased mobility of her left lower extremity, at baseline, brace in place.  Warm, well perfused.  No edema bilaterally. Skin: No rashes or lesions noted. Lymphatics: No cervical, supraclavicular, or inguinal adenopathy. GU: Normal appearing external genitalia without erythema, excoriation, or lesions.  Speculum exam reveals moderately atrophic vaginal mucosa consistent with radiation changes, no lesions or masses.  Small speculum used.   Bimanual exam reveals no nodularity.  Rectovaginal exam confirms his exam findings.  Laboratory & Radiologic Studies: None new  Assessment & Plan: [name] is a 72 y.o. 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 Finished adjuvant VBT in 06/2019. MS-stable. Negative genetic testing.   Patient is doing well and is NED on exam today.  There is no evidence  of agglutination on exam.  We will plan either for oral Dilaudid with an anti-emetic to be taken before she gets here or vaginal Valium.  Unfortunately, premedications were not sent in today so there was some delay in performing the patient's exam as we gave p.o. Zofran and IM Dilaudid today.  Patient tolerated the exam very well and I think would do fine with either vaginal Valium or potentially no medication for her next visit.   Per NCCN surveillance recommendations, we will continue with visits every 6 months.  She prefers to have this with me only.  I will see her back in December.  We discussed signs and symptoms that would be concerning for disease recurrence, and the patient knows to call if she develops any of these sooner than her next scheduled visit.    20 minutes of total time was spent for this patient encounter, including preparation, face-to-face counseling with the patient and coordination of care, and documentation of the encounter.  Eugene Garnet, MD  Division of Gynecologic Oncology  Department of Obstetrics and Gynecology  [facility]

## 2022-07-01 DIAGNOSIS — E010 Iodine-deficiency related diffuse (endemic) goiter: Secondary | ICD-10-CM | POA: Diagnosis not present

## 2022-07-01 DIAGNOSIS — E559 Vitamin D deficiency, unspecified: Secondary | ICD-10-CM | POA: Diagnosis not present

## 2022-07-01 DIAGNOSIS — I10 Essential (primary) hypertension: Secondary | ICD-10-CM | POA: Diagnosis not present

## 2022-07-01 DIAGNOSIS — J3081 Allergic rhinitis due to animal (cat) (dog) hair and dander: Secondary | ICD-10-CM | POA: Diagnosis not present

## 2022-07-01 DIAGNOSIS — J301 Allergic rhinitis due to pollen: Secondary | ICD-10-CM | POA: Diagnosis not present

## 2022-07-01 DIAGNOSIS — J3089 Other allergic rhinitis: Secondary | ICD-10-CM | POA: Diagnosis not present

## 2022-07-01 DIAGNOSIS — M810 Age-related osteoporosis without current pathological fracture: Secondary | ICD-10-CM | POA: Diagnosis not present

## 2022-07-01 DIAGNOSIS — E041 Nontoxic single thyroid nodule: Secondary | ICD-10-CM | POA: Diagnosis not present

## 2022-07-04 NOTE — Progress Notes (Signed)
[facility] ([id number]) [id number] Page 1 of 8 Visit Report for 06/07/2022 Arrival Information Details Patient Name: Date of Service: [name], [name]. 06/07/2022 12:45 PM Medical Record Number: [id number] Patient Account Number: [id number] Date of Birth/Sex: Treating RN: Apr 23, 1950 (72 y.o. [name], [name] Primary Care Rennie Hack: Creola Corn Other Clinician: Referring Maryuri Warnke: Treating Jr Milliron/Extender: Lacretia Leigh in Treatment: 11 Visit Information History Since Last Visit Added or deleted any medications: No Patient Arrived: Dan Humphreys Any new allergies or adverse reactions: No Arrival Time: 12:42 Had a fall or experienced change in No Accompanied By: self activities of daily living that may affect Transfer Assistance: None risk of falls: Patient Identification Verified: Yes Signs or symptoms of abuse/neglect since last visito No Secondary Verification Process Completed: Yes Hospitalized since last visit: No Patient Requires Transmission-Based Precautions: No Implantable device outside of the clinic excluding No Patient Has Alerts: No cellular tissue based products placed in the center since last visit: Has Dressing in Place as Prescribed: Yes Has Compression in Place as Prescribed: Yes Pain Present Now: No Electronic Signature(s) Signed: 06/07/2022 4:32:19 PM By: Shawn Stall RN, BSN Entered By: Shawn Stall on 06/07/2022 12:43:02 -------------------------------------------------------------------------------- Clinic Level of Care Assessment Details Patient Name: Date of Service: Desiree SELENI, RELLER 06/07/2022 12:45 PM Medical Record Number: [id number] Patient Account Number: [id number] Date of Birth/Sex: Treating RN: 29-Dec-1950 (72 y.o. F) Primary Care Kamorie Aldous: Creola Corn Other Clinician: Referring Estel Tonelli: Treating Eathel Pajak/Extender: Lacretia Leigh in Treatment: 11 Clinic Level of Care Assessment  Items TOOL 4 Quantity Score []  - 0 Use when only an EandM is performed on FOLLOW-UP visit ASSESSMENTS - Nursing Assessment / Reassessment X- 1 10 Reassessment of Co-morbidities (includes updates in patient status) X- 1 5 Reassessment of Adherence to Treatment Plan ASSESSMENTS - Wound and Skin A ssessment / Reassessment X - Simple Wound Assessment / Reassessment - one wound 1 5 []  - 0 Complex Wound Assessment / Reassessment - multiple wounds []  - 0 Dermatologic / Skin Assessment (not related to wound area) ASSESSMENTS - Focused Assessment []  - 0 Circumferential Edema Measurements - multi extremities []  - 0 Nutritional Assessment / Counseling / Intervention Desiree, [name] ([id number]) [id number] Page 2 of 8 []  - 0 Lower Extremity Assessment (monofilament, tuning fork, pulses) []  - 0 Peripheral Arterial Disease Assessment (using hand held doppler) ASSESSMENTS - Ostomy and/or Continence Assessment and Care []  - 0 Incontinence Assessment and Management []  - 0 Ostomy Care Assessment and Management (repouching, etc.) PROCESS - Coordination of Care X - Simple Patient / Family Education for ongoing care 1 15 []  - 0 Complex (extensive) Patient / Family Education for ongoing care X- 1 10 Staff obtains Chiropractor, Records, T Results / Process Orders est []  - 0 Staff telephones HHA, Nursing Homes / Clarify orders / etc []  - 0 Routine Transfer to another Facility (non-emergent condition) []  - 0 Routine Hospital Admission (non-emergent condition) []  - 0 New Admissions / Manufacturing engineer / Ordering NPWT Apligraf, etc. , []  - 0 Emergency Hospital Admission (emergent condition) X- 1 10 Simple Discharge Coordination []  - 0 Complex (extensive) Discharge Coordination PROCESS - Special Needs []  - 0 Pediatric / Minor Patient Management []  - 0 Isolation Patient Management []  - 0 Hearing / Language / Visual special needs []  - 0 Assessment of  Community assistance (transportation, D/C planning, etc.) []  - 0 Additional assistance / Altered mentation []  - 0 Support Surface(s) Assessment (bed, cushion, seat, etc.) INTERVENTIONS - Wound Cleansing /  Measurement X - Simple Wound Cleansing - one wound 1 5 []  - 0 Complex Wound Cleansing - multiple wounds X- 1 5 Wound Imaging (photographs - any number of wounds) []  - 0 Wound Tracing (instead of photographs) X- 1 5 Simple Wound Measurement - one wound []  - 0 Complex Wound Measurement - multiple wounds INTERVENTIONS - Wound Dressings X - Small Wound Dressing one or multiple wounds 1 10 []  - 0 Medium Wound Dressing one or multiple wounds []  - 0 Large Wound Dressing one or multiple wounds []  - 0 Application of Medications - topical []  - 0 Application of Medications - injection INTERVENTIONS - Miscellaneous []  - 0 External ear exam []  - 0 Specimen Collection (cultures, biopsies, blood, body fluids, etc.) []  - 0 Specimen(s) / Culture(s) sent or taken to Lab for analysis []  - 0 Patient Transfer (multiple staff / Nurse, adult / Similar devices) []  - 0 Simple Staple / Suture removal (25 or less) []  - 0 Complex Staple / Suture removal (26 or more) []  - 0 Hypo / Hyperglycemic Management (close monitor of Blood Glucose) Desiree, [name] ([id number]) [id number] Page 3 of 8 []  - 0 Ankle / Brachial Index (ABI) - do not check if billed separately X- 1 5 Vital Signs Has the patient been seen at the hospital within the last three years: Yes Total Score: 85 Level Of Care: New/Established - Level 3 Electronic Signature(s) Signed: 07/04/2022 3:00:01 PM By: Pearletha Alfred Entered By: Pearletha Alfred on 07/04/2022 09:06:07 -------------------------------------------------------------------------------- Encounter Discharge Information Details Patient Name: Date of Service: [name]. 06/07/2022 12:45 PM Medical Record Number: [id number] Patient Account Number:  [id number] Date of Birth/Sex: Treating RN: Jun 29, 1950 (72 y.o. [name] Primary Care Arlee Bossard: Creola Corn Other Clinician: Referring Maalle Starrett: Treating Deira Shimer/Extender: Lacretia Leigh in Treatment: 11 Encounter Discharge Information Items Discharge Condition: Stable Ambulatory Status: Walker Discharge Destination: Home Transportation: Private Auto Accompanied By: self Schedule Follow-up Appointment: Yes Clinical Summary of Care: Electronic Signature(s) Signed: 06/07/2022 4:32:19 PM By: Shawn Stall RN, BSN Entered By: Shawn Stall on 06/07/2022 12:53:20 -------------------------------------------------------------------------------- Lower Extremity Assessment Details Patient Name: Date of Service: Desiree David Stall. 06/07/2022 12:45 PM Medical Record Number: [id number] Patient Account Number: [id number] Date of Birth/Sex: Treating RN: 1950/02/21 (72 y.o. [name], Yvonne Kendall Primary Care Nikkol Pai: Creola Corn Other Clinician: Referring Danija Gosa: Treating Kratos Ruscitti/Extender: Lacretia Leigh in Treatment: 11 Edema Assessment Assessed: Kyra Searles: Yes] Franne Forts: No] Edema: [Left: N] [Right: o] Calf Left: Right: Point of Measurement: 27 cm From Medial Instep 31 cm Ankle Left: Right: Point of Measurement: 9 cm From Medial Instep 24 cm Vascular Assessment Left: [[id number] Page 4 of 8Right:] Pulses: Dorsalis Pedis Palpable: [[id number] Page 4 of 8Yes] Electronic Signature(s) Signed: 06/07/2022 4:32:19 PM By: Shawn Stall RN, BSN Entered By: Shawn Stall on 06/07/2022 12:43:22 -------------------------------------------------------------------------------- Multi Wound Chart Details Patient Name: Date of Service: [name]. 06/07/2022 12:45 PM Medical Record Number: [id number] Patient Account Number: [id number] Date of Birth/Sex: Treating RN: 1950/03/10 (72 y.o. F) Primary Care  Moe Graca: Creola Corn Other Clinician: Referring Amali Uhls: Treating Khang Hannum/Extender: Lacretia Leigh in Treatment: 11 Vital Signs Height(in): 62 Pulse(bpm): 76 Weight(lbs): 148 Blood Pressure(mmHg): 125/80 Body Mass Index(BMI): 27.1 Temperature(F): 98.1 Respiratory Rate(breaths/min): 20 [1:Photos:] [N/A:N/A] Left, Anterior Lower Leg N/A N/A Wound Location: Surgical Injury N/A N/A Wounding Event: Dehisced Wound N/A N/A Primary Etiology: Asthma, Hypertension, Received N/A N/A Comorbid History: Chemotherapy, Received Radiation 03/01/2022 N/A N/A Date Acquired:  11 N/A N/A Weeks of Treatment: Open N/A N/A Wound Status: No N/A N/A Wound Recurrence: 1.5x0.9x0.1 N/A N/A Measurements L x W x D (cm) 1.06 N/A N/A A (cm) : rea 0.106 N/A N/A Volume (cm) : 93.40% N/A N/A % Reduction in Area: 93.40% N/A N/A % Reduction in Volume: Full Thickness Without Exposed N/A N/A Classification: Support Structures Medium N/A N/A Exudate Amount: Serosanguineous N/A N/A Exudate Type: red, brown N/A N/A Exudate Color: Distinct, outline attached N/A N/A Wound Margin: Large (67-100%) N/A N/A Granulation Amount: Red, Pink N/A N/A Granulation Quality: None Present (0%) N/A N/A Necrotic Amount: Fat Layer (Subcutaneous Tissue): Yes N/A N/A Exposed Structures: Fascia: No Tendon: No Muscle: No Joint: No Bone: No Large (67-100%) N/A N/A Epithelialization: Excoriation: No N/A N/A Periwound Skin Texture: Induration: No Callus: No Crepitus: No NIKKOL, CONKEL ([id number]) [id number] Page 5 of 8 Rash: No Scarring: No Maceration: No N/A N/A Periwound Skin Moisture: Dry/Scaly: No Atrophie Blanche: No N/A N/A Periwound Skin Color: Cyanosis: No Ecchymosis: No Erythema: No Hemosiderin Staining: No Mottled: No Pallor: No Rubor: No No Abnormality N/A N/A Temperature: Treatment Notes Wound #1 (Lower Leg) Wound Laterality: Left,  Anterior Cleanser Soap and Water Discharge Instruction: May shower and wash wound with dial antibacterial soap and water prior to dressing change. Vashe 5.8 (oz) Discharge Instruction: Cleanse the wound with Vashe prior to applying a clean dressing using gauze sponges, not tissue or cotton balls. Peri-Wound Care Sween Lotion (Moisturizing lotion) Discharge Instruction: Apply moisturizing lotion as directed Topical Skintegrity Hydrogel 4 (oz) Discharge Instruction: Apply hydrogel as directed Primary Dressing Promogran Prisma Matrix, 4.34 (sq in) (silver collagen) Discharge Instruction: Moisten collagen with saline or hydrogel Secondary Dressing Secured With Coban Self-Adherent Wrap 4x5 (in/yd) Discharge Instruction: Secure with Coban as directed. Kerlix Roll Sterile, 4.5x3.1 (in/yd) Discharge Instruction: Secure with Kerlix as directed. Compression Wrap Compression Stockings Add-Ons Electronic Signature(s) Signed: 06/07/2022 4:47:09 PM By: Baltazar Najjar MD Entered By: Baltazar Najjar on 06/07/2022 12:57:00 -------------------------------------------------------------------------------- Multi-Disciplinary Care Plan Details Patient Name: Date of Service: [name]. 06/07/2022 12:45 PM Medical Record Number: [id number] Patient Account Number: [id number] Date of Birth/Sex: Treating RN: [birth year] (72 y.o. [name] Primary Care Chariti Havel: Creola Corn Other Clinician: Referring Amedee Cerrone: Treating Juanell Saffo/Extender: Lacretia Leigh in Treatment: [address] DANAJHA, WIEHE Judie Petit ([id number]) [id number] Page 6 of 8 Wound/Skin Impairment Nursing Diagnoses: Impaired tissue integrity Goals: Patient/caregiver will verbalize understanding of skin care regimen Date Initiated: 03/21/2022 Target Resolution Date: 07/03/2022 Goal Status: Active Interventions: Assess ulceration(s) every visit Treatment Activities: Skin care regimen  initiated : 03/21/2022 Notes: Electronic Signature(s) Signed: 06/07/2022 4:32:19 PM By: Shawn Stall RN, BSN Entered By: Shawn Stall on 06/07/2022 12:44:58 -------------------------------------------------------------------------------- Pain Assessment Details Patient Name: Date of Service: [name]. 06/07/2022 12:45 PM Medical Record Number: [id number] Patient Account Number: [id number] Date of Birth/Sex: Treating RN: [birth year] (72 y.o. [name] Primary Care Bettyjane Shenoy: Creola Corn Other Clinician: Referring Annalei Friesz: Treating Jnai Snellgrove/Extender: Lacretia Leigh in Treatment: 11 Active Problems Location of Pain Severity and Description of Pain Patient Has Paino No Site Locations Pain Management and Medication Current Pain Management: Electronic Signature(s) Signed: 06/07/2022 4:32:19 PM By: Shawn Stall RN, BSN Entered By: Shawn Stall on 06/07/2022 12:43:08 Eric Form ([id number]) [id number] Page 7 of 8 -------------------------------------------------------------------------------- Patient/Caregiver Education Details Patient Name: Date of Service: Desiree KIAN, CALISTRO 6/4/2024andnbsp12:45 PM Medical Record Number: [id number] Patient Account Number: [id number] Date of Birth/Gender: Treating RN: [birth year] (  72 y.o. [name] Primary Care Physician: Creola Corn Other Clinician: Referring Physician: Treating Physician/Extender: Lacretia Leigh in Treatment: 11 Education Assessment Education Provided To: Patient Education Topics Provided Wound/Skin Impairment: Handouts: Caring for Your Ulcer Methods: Explain/Verbal Responses: Reinforcements needed Electronic Signature(s) Signed: 06/07/2022 4:32:19 PM By: Shawn Stall RN, BSN Entered By: Shawn Stall on 06/07/2022 12:45:08 -------------------------------------------------------------------------------- Wound Assessment Details Patient Name:  Date of Service: [name]. 06/07/2022 12:45 PM Medical Record Number: [id number] Patient Account Number: [id number] Date of Birth/Sex: Treating RN: 1950-01-24 (72 y.o. [name], Yvonne Kendall Primary Care Darlisa Spruiell: Creola Corn Other Clinician: Referring Thomasina Housley: Treating Jaslen Adcox/Extender: Lacretia Leigh in Treatment: 11 Wound Status Wound Number: 1 Primary Dehisced Wound Etiology: Wound Location: Left, Anterior Lower Leg Wound Status: Open Wounding Event: Surgical Injury Notes: pt. had a Melanoma removed by Dr. Irene Limbo Date Acquired: 03/01/2022 Comorbid Asthma, Hypertension, Received Chemotherapy, Received Weeks Of Treatment: 11 History: Radiation Clustered Wound: No Photos Wound Measurements Length: (cm) 1.5 LUTIE, SAWADA ([id number]) Width: (cm) 0.9 Depth: (cm) 0.1 Area: (cm) 1. Volume: (cm) 0. % Reduction in Area: 93.4% [id number] Page 8 of 8 % Reduction in Volume: 93.4% Epithelialization: Large (67-100%) 06 Tunneling: No 106 Undermining: No Wound Description Classification: Full Thickness Without Exposed Support Structures Wound Margin: Distinct, outline attached Exudate Amount: Medium Exudate Type: Serosanguineous Exudate Color: red, brown Foul Odor After Cleansing: No Slough/Fibrino No Wound Bed Granulation Amount: Large (67-100%) Exposed Structure Granulation Quality: Red, Pink Fascia Exposed: No Necrotic Amount: None Present (0%) Fat Layer (Subcutaneous Tissue) Exposed: Yes Tendon Exposed: No Muscle Exposed: No Joint Exposed: No Bone Exposed: No Periwound Skin Texture Texture Color No Abnormalities Noted: No No Abnormalities Noted: No Callus: No Atrophie Blanche: No Crepitus: No Cyanosis: No Excoriation: No Ecchymosis: No Induration: No Erythema: No Rash: No Hemosiderin Staining: No Scarring: No Mottled: No Pallor: No Moisture Rubor: No No Abnormalities Noted: No Dry / Scaly: No Temperature  / Pain Maceration: No Temperature: No Abnormality Electronic Signature(s) Signed: 06/07/2022 4:32:19 PM By: Shawn Stall RN, BSN Entered By: Shawn Stall on 06/07/2022 12:45:23 -------------------------------------------------------------------------------- Vitals Details Patient Name: Date of Service: [name]. 06/07/2022 12:45 PM Medical Record Number: [id number] Patient Account Number: [id number] Date of Birth/Sex: Treating RN: 1950/09/03 (72 y.o. [name], Yvonne Kendall Primary Care Jorey Dollard: Creola Corn Other Clinician: Referring Devita Nies: Treating Mardel Grudzien/Extender: Lacretia Leigh in Treatment: 11 Vital Signs Time Taken: 12:45 Temperature (F): 98.1 Height (in): 62 Pulse (bpm): 76 Weight (lbs): 148 Respiratory Rate (breaths/min): 20 Body Mass Index (BMI): 27.1 Blood Pressure (mmHg): 125/80 Reference Range: 80 - 120 mg / dl Electronic Signature(s) Signed: 06/07/2022 4:32:19 PM By: Shawn Stall RN, BSN Entered By: Shawn Stall on 06/07/2022 12:48:15

## 2022-07-05 ENCOUNTER — Encounter (HOSPITAL_BASED_OUTPATIENT_CLINIC_OR_DEPARTMENT_OTHER): Payer: Medicare Other | Attending: Internal Medicine | Admitting: General Surgery

## 2022-07-05 DIAGNOSIS — I890 Lymphedema, not elsewhere classified: Secondary | ICD-10-CM | POA: Insufficient documentation

## 2022-07-05 DIAGNOSIS — G14 Postpolio syndrome: Secondary | ICD-10-CM | POA: Insufficient documentation

## 2022-07-05 DIAGNOSIS — L97822 Non-pressure chronic ulcer of other part of left lower leg with fat layer exposed: Secondary | ICD-10-CM | POA: Insufficient documentation

## 2022-07-05 DIAGNOSIS — I10 Essential (primary) hypertension: Secondary | ICD-10-CM | POA: Insufficient documentation

## 2022-07-05 DIAGNOSIS — E785 Hyperlipidemia, unspecified: Secondary | ICD-10-CM | POA: Insufficient documentation

## 2022-07-05 NOTE — Progress Notes (Signed)
RUTHE, LUSTIG ([id number]) [id number] Page 1 of 5 Visit Report for 07/05/2022 Arrival Information Details Patient Name: Date of Service: [name], [name]. 07/05/2022 11:00 A M Medical Record Number: [id number] Patient Account Number: [id number] Date of Birth/Sex: Treating RN: 12-28-1950 (72 y.o. F) Primary Care Nadim Malia: Creola Corn Other Clinician: Referring Siler Mavis: Treating Telesia Ates/Extender: Mikki Santee in Treatment: 15 Visit Information History Since Last Visit Added or deleted any medications: No Patient Arrived: Ambulatory Any new allergies or adverse reactions: No Arrival Time: 11:00 Had a fall or experienced change in No Accompanied By: self activities of daily living that may affect Transfer Assistance: None risk of falls: Patient Identification Verified: Yes Signs or symptoms of abuse/neglect since last visito No Secondary Verification Process Completed: Yes Hospitalized since last visit: No Patient Requires Transmission-Based Precautions: No Implantable device outside of the clinic excluding No Patient Has Alerts: No cellular tissue based products placed in the center since last visit: Has Dressing in Place as Prescribed: Yes Has Compression in Place as Prescribed: Yes Pain Present Now: No Electronic Signature(s) Signed: 07/05/2022 5:11:40 PM By: Thayer Dallas Entered By: Thayer Dallas on 07/05/2022 16:29:23 -------------------------------------------------------------------------------- Clinic Level of Care Assessment Details Patient Name: Date of Service: Desiree TAKAIYA, SMOLEN. 07/05/2022 11:00 A M Medical Record Number: [id number] Patient Account Number: [id number] Date of Birth/Sex: Treating RN: [birth year] (72 y.o. F) Primary Care Shiana Rappleye: Creola Corn Other Clinician: Thayer Dallas Referring Stanislaus Kaltenbach: Treating Seena Ritacco/Extender: Mikki Santee in Treatment: 15 Clinic Level of Care Assessment  Items TOOL 4 Quantity Score X- 1 0 Use when only an EandM is performed on FOLLOW-UP visit ASSESSMENTS - Nursing Assessment / Reassessment X- 1 10 Reassessment of Co-morbidities (includes updates in patient status) X- 1 5 Reassessment of Adherence to Treatment Plan ASSESSMENTS - Wound and Skin A ssessment / Reassessment []  - 0 Simple Wound Assessment / Reassessment - one wound []  - 0 Complex Wound Assessment / Reassessment - multiple wounds []  - 0 Dermatologic / Skin Assessment (not related to wound area) ASSESSMENTS - Focused Assessment []  - 0 Circumferential Edema Measurements - multi extremities []  - 0 Nutritional Assessment / Counseling / Intervention ALAINNA, KARCHER ([id number]) [id number] Page 2 of 5 []  - 0 Lower Extremity Assessment (monofilament, tuning fork, pulses) []  - 0 Peripheral Arterial Disease Assessment (using hand held doppler) ASSESSMENTS - Ostomy and/or Continence Assessment and Care []  - 0 Incontinence Assessment and Management []  - 0 Ostomy Care Assessment and Management (repouching, etc.) PROCESS - Coordination of Care X - Simple Patient / Family Education for ongoing care 1 15 []  - 0 Complex (extensive) Patient / Family Education for ongoing care []  - 0 Staff obtains Chiropractor, Records, T Results / Process Orders est []  - 0 Staff telephones HHA, Nursing Homes / Clarify orders / etc []  - 0 Routine Transfer to another Facility (non-emergent condition) []  - 0 Routine Hospital Admission (non-emergent condition) []  - 0 New Admissions / Manufacturing engineer / Ordering NPWT Apligraf, etc. , []  - 0 Emergency Hospital Admission (emergent condition) X- 1 10 Simple Discharge Coordination []  - 0 Complex (extensive) Discharge Coordination PROCESS - Special Needs []  - 0 Pediatric / Minor Patient Management []  - 0 Isolation Patient Management []  - 0 Hearing / Language / Visual special needs []  - 0 Assessment of Community  assistance (transportation, D/C planning, etc.) []  - 0 Additional assistance / Altered mentation []  - 0 Support Surface(s) Assessment (bed, cushion, seat, etc.) INTERVENTIONS - Wound Cleansing /  Measurement X - Simple Wound Cleansing - one wound 1 5 []  - 0 Complex Wound Cleansing - multiple wounds []  - 0 Wound Imaging (photographs - any number of wounds) []  - 0 Wound Tracing (instead of photographs) []  - 0 Simple Wound Measurement - one wound []  - 0 Complex Wound Measurement - multiple wounds INTERVENTIONS - Wound Dressings X - Small Wound Dressing one or multiple wounds 1 10 []  - 0 Medium Wound Dressing one or multiple wounds []  - 0 Large Wound Dressing one or multiple wounds []  - 0 Application of Medications - topical []  - 0 Application of Medications - injection INTERVENTIONS - Miscellaneous []  - 0 External ear exam []  - 0 Specimen Collection (cultures, biopsies, blood, body fluids, etc.) []  - 0 Specimen(s) / Culture(s) sent or taken to Lab for analysis []  - 0 Patient Transfer (multiple staff / Nurse, adult / Similar devices) []  - 0 Simple Staple / Suture removal (25 or less) []  - 0 Complex Staple / Suture removal (26 or more) []  - 0 Hypo / Hyperglycemic Management (close monitor of Blood Glucose) ELONI, DOOMS ([id number]) [id number] Page 3 of 5 []  - 0 Ankle / Brachial Index (ABI) - do not check if billed separately []  - 0 Vital Signs Has the patient been seen at the hospital within the last three years: Yes Total Score: 55 Level Of Care: New/Established - Level 2 Electronic Signature(s) Signed: 07/05/2022 5:11:40 PM By: Thayer Dallas Entered By: Thayer Dallas on 07/05/2022 16:44:14 -------------------------------------------------------------------------------- Encounter Discharge Information Details Patient Name: Date of Service: [name]. 07/05/2022 11:00 A M Medical Record Number: [id number] Patient Account Number:  [id number] Date of Birth/Sex: Treating RN: 01-20-1950 (72 y.o. F) Primary Care Cheyrl Buley: Creola Corn Other Clinician: Thayer Dallas Referring Jazyah Butsch: Treating Glendale Youngblood/Extender: Mikki Santee in Treatment: 15 Encounter Discharge Information Items Discharge Condition: Stable Ambulatory Status: Ambulatory Discharge Destination: Home Transportation: Private Auto Accompanied By: self Schedule Follow-up Appointment: Yes Clinical Summary of Care: Electronic Signature(s) Signed: 07/05/2022 5:11:40 PM By: Thayer Dallas Entered By: Thayer Dallas on 07/05/2022 16:38:16 -------------------------------------------------------------------------------- Patient/Caregiver Education Details Patient Name: Date of Service: [name] 7/2/2024andnbsp11:00 A M Medical Record Number: [id number] Patient Account Number: [id number] Date of Birth/Gender: Treating RN: Jan 26, 1950 (72 y.o. F) Primary Care Physician: Creola Corn Other Clinician: Thayer Dallas Referring Physician: Treating Physician/Extender: Mikki Santee in Treatment: 15 Education Assessment Education Provided To: Patient Education Topics Provided Electronic Signature(s) Signed: 07/05/2022 5:11:40 PM By: Thayer Dallas Entered By: Thayer Dallas on 07/05/2022 16:37:45 Eric Form ([id number]) [id number] Page 4 of 5 -------------------------------------------------------------------------------- Wound Assessment Details Patient Name: Date of Service: Desiree DAZAH, LOSIER. 07/05/2022 11:00 A M Medical Record Number: [id number] Patient Account Number: [id number] Date of Birth/Sex: Treating RN: 20-Mar-1950 (72 y.o. F) Primary Care Azazel Franze: Creola Corn Other Clinician: Referring Sims Laday: Treating Ever Halberg/Extender: Mikki Santee in Treatment: 15 Wound Status Wound Number: 1 Primary Etiology: Dehisced Wound Wound Location: Left, Anterior  Lower Leg Wound Status: Open Wounding Event: Surgical Injury Notes: pt. had a Melanoma removed by Dr. Irene Limbo Date Acquired: 03/01/2022 Weeks Of Treatment: 15 Clustered Wound: No Wound Measurements Length: (cm) 0.9 Width: (cm) 0.3 Depth: (cm) 0.1 Area: (cm) 0.212 Volume: (cm) 0.021 % Reduction in Area: 98.7% % Reduction in Volume: 98.7% Wound Description Classification: Full Thickness Without Exposed Suppor Exudate Amount: Medium Exudate Type: Serosanguineous Exudate Color: red, brown t Structures Periwound Skin Texture Texture Color No Abnormalities Noted: No No  Abnormalities Noted: No Moisture No Abnormalities Noted: No Treatment Notes Wound #1 (Lower Leg) Wound Laterality: Left, Anterior Cleanser Soap and Water Discharge Instruction: May shower and wash wound with dial antibacterial soap and water prior to dressing change. Vashe 5.8 (oz) Discharge Instruction: Cleanse the wound with Vashe prior to applying a clean dressing using gauze sponges, not tissue or cotton balls. Peri-Wound Care Sween Lotion (Moisturizing lotion) Discharge Instruction: Apply moisturizing lotion as directed Topical Skintegrity Hydrogel 4 (oz) Discharge Instruction: Apply hydrogel as directed Primary Dressing Promogran Prisma Matrix, 4.34 (sq in) (silver collagen) Discharge Instruction: Moisten collagen with saline or hydrogel Secondary Dressing Zetuvit Plus Silicone Border Dressing 4x4 (in/in) Discharge Instruction: Apply silicone border over primary dressing as directed. Secured With Compression Wrap Personal 20/30 compression stocking Compression Stockings AYASHA, EGUSQUIZA ([id number]) [id number] Page 5 of 5 Add-Ons Notes Patient refused to be rewrapped. Wanted to where her own personal stocking. Electronic Signature(s) Signed: 07/05/2022 5:11:40 PM By: Thayer Dallas Entered By: Thayer Dallas on 07/05/2022  16:29:41 -------------------------------------------------------------------------------- Vitals Details Patient Name: Date of Service: [name]. 07/05/2022 11:00 A M Medical Record Number: [id number] Patient Account Number: [id number] Date of Birth/Sex: Treating RN: [birth year] (72 y.o. F) Primary Care Eithel Ryall: Creola Corn Other Clinician: Thayer Dallas Referring Mykell Rawl: Treating Juel Ripley/Extender: Mikki Santee in Treatment: 15 Vital Signs Time Taken: 11:00 Reference Range: 80 - 120 mg / dl Height (in): 62 Weight (lbs): 148 Body Mass Index (BMI): 27.1 Electronic Signature(s) Signed: 07/05/2022 5:11:40 PM By: Thayer Dallas Entered By: Thayer Dallas on 07/05/2022 16:29:32

## 2022-07-05 NOTE — Progress Notes (Signed)
[name], [name] ([id number]) [id number] Page 1 of 6 Visit Report for 06/07/2022 HPI Details Patient Name: Date of Service: GO ZAELIA, FINCKE. 06/07/2022 12:45 PM Medical Record Number: [id number] Patient Account Number: [id number] Date of Birth/Sex: Treating RN: 10/15/1950 (72 y.o. F) Primary Care Provider: Creola Corn Other Clinician: Referring Provider: Treating Provider/Extender: Lacretia Leigh in Treatment: 11 History of Present Illness HPI Description: 03/21/2022 Ms. [name] is a 72 year old female with a past medical history of postpolio syndrome, endometrial cancer and essential hypertension that presents the clinic for a 1 month history of nonhealing ulcer to the left lower extremity. She states that she had a melanoma removed from the left lower extremity on 2/27 by Dr. Irene Limbo. After the surgery the wound dehisced. They have been using compression wraps. It is unclear what dressing has been used. She has no issues or complaints today. She denies signs of infection. 3/25; Patient presents for follow-up. We have been using Santyl and Hydrofera Blue under Kerlix/Coban. There is been improvement in wound healing. She had no issues with the compression wrap. 4/8; patient presents for follow-up. We have been using Santyl Hydrofera Blue under Kerlix/Coban. Wound continues to improve in size and appearance. She still has slough buildup. I think in the near future she would do well with a skin substitute. We discussed this and patient was agreeable to proceed with insurance verification. We will go ahead and run her for PuraPly and Grafix. 4/15; patient presents for follow-up. We have been using Santyl and Hydrofera Blue under Kerlix/Coban. The wound is smaller. She has been approved for PuraPly and she is agreeable to proceed with placement today. She denies signs of infection. 4/22; patient presents for follow-up. We placed PuraPly on the wound  bed at last clinic visit under Kerlix/Coban. Unfortunately the PuraPly dried out. She denies signs of infection. 4/29; patient presents for follow-up. We placed Grafix on the wound bed at last clinic visit under Kerlix/Coban. Again the wound bed appears dry. No signs of infection. 6/5; patient with a wound on her left anterior lower leg in the setting of Mohs surgery for a malignant melanoma. She has been using Santyl Hydrofera Blue kerlix Coban. She went for her arterial studies at [facility]. She was not able to tolerate an ABI however her TBI was 1.16 in the normal range and the left dorsalis pedis pulse waveform was within normal limits therefore she does not seem to have significant arterial issues. Her wound is measuring smaller 5/13; patient presents for follow-up. We have been using Santyl and Hydrofera Blue under Kerlix/Coban. She has done well with this. Her wound is smaller. The next week she will be out of town and does not want the compression wrap on. I recommended using Hydrofera Blue 3 other day with her compression stocking. 5/23; patient presents for follow up. Due to an out-of-town trip we held off on doing the in office compression wrap and she used her compression stocking daily. She has been using Hydrofera Blue. She has no issues or complaints today. Wound is stable and measurement however there is more recent tissue at the surface. 06-01-2022 upon evaluation today patient appears to be doing well currently in regard to her wound. In fact the switch to collagen seems to have done very well for her over the past week. Fortunately I do not see any signs of active infection locally nor systemically which is great news. 6/4; patient continues to do nicely with the surgical  wound on her left anterior lower leg she has been using Prisma with kerlix Coban compression. Electronic Signature(s) Signed: 06/07/2022 4:47:09 PM By: Baltazar Najjar MD Entered By: Baltazar Najjar on  06/07/2022 12:57:34 -------------------------------------------------------------------------------- Physical Exam Details Patient Name: Date of Service: [name]. 06/07/2022 12:45 PM Medical Record Number: [id number] Patient Account Number: [id number] Date of Birth/Sex: Treating RN: 08/13/1950 (72 y.o. F) Primary Care Provider: Creola Corn Other Clinician: Referring Provider: Treating Provider/Extender: Evonna, Moynahan, Pascal Lux ([id number]) [id number] Page 2 of 6 Weeks in Treatment: 11 Notes Wound exam; left anterior lower leg. Healthy looking granulation. Under illumination no evidence of anything that requires mechanical debridement. She does have a slight eschar on the edge but I did not debride these today as we continue to have robust improvement in surface area Electronic Signature(s) Signed: 06/07/2022 4:47:09 PM By: Baltazar Najjar MD Entered By: Baltazar Najjar on 06/07/2022 12:58:40 -------------------------------------------------------------------------------- Physician Orders Details Patient Name: Date of Service: [name]. 06/07/2022 12:45 PM Medical Record Number: [id number] Patient Account Number: [id number] Date of Birth/Sex: Treating RN: 1950/04/17 (72 y.o. [name] Primary Care Provider: Creola Corn Other Clinician: Referring Provider: Treating Provider/Extender: Lacretia Leigh in Treatment: 11 Verbal / Phone Orders: No Diagnosis Coding ICD-10 Coding Code Description [phone] Non-pressure chronic ulcer of other part of left lower leg with fat layer exposed T81.31XA Disruption of external operation (surgical) wound, not elsewhere classified, initial encounter C43.9 Malignant melanoma of skin, unspecified G14 Postpolio syndrome C54.1 Malignant neoplasm of endometrium I89.0 Lymphedema, not elsewhere classified Follow-up Appointments ppointment in 1 week. - Dr. Mikey Bussing room 8  06/14/2022 tuesday 1100 Return A ppointment in 2 weeks. - Dr. Mikey Bussing Tuesday 145pm 06/14/2022 room 8 Return A Anesthetic (In clinic) Topical Lidocaine 4% applied to wound bed - used in clinic Bathing/ Shower/ Hygiene May shower with protection but do not get wound dressing(s) wet. Protect dressing(s) with water repellant cover (for example, large plastic bag) or a cast cover and may then take shower. Edema Control - Lymphedema / SCD / Other Elevate legs to the level of the heart or above for 30 minutes daily and/or when sitting for 3-4 times a day throughout the day. Avoid standing for long periods of time. Patient to wear own compression stockings every day. - apply in the morning and remove at night. Exercise regularly - Walk/exercise a s tolerated throughout the day Moisturize legs daily. Wound Treatment Wound #1 - Lower Leg Wound Laterality: Left, Anterior Cleanser: Soap and Water Every Other Day/30 Days Discharge Instructions: May shower and wash wound with dial antibacterial soap and water prior to dressing change. Cleanser: Vashe 5.8 (oz) Every Other Day/30 Days Discharge Instructions: Cleanse the wound with Vashe prior to applying a clean dressing using gauze sponges, not tissue or cotton balls. Peri-Wound Care: Sween Lotion (Moisturizing lotion) Every Other Day/30 Days Discharge Instructions: Apply moisturizing lotion as directed Topical: Skintegrity Hydrogel 4 (oz) Every Other Day/30 Days Discharge Instructions: Apply hydrogel as directed Prim Dressing: Promogran Prisma Matrix, 4.34 (sq in) (silver collagen) Every Other Day/30 Days ary Discharge Instructions: Moisten collagen with saline or hydrogel ALIYANNA, GENDLER ([id number]) [id number] Page 3 of 6 Secured With: Coban Self-Adherent Wrap 4x5 (in/yd) Every Other Day/30 Days Discharge Instructions: Secure with Coban as directed. Secured With: [employer], 4.5x3.1 (in/yd) Every Other Day/30  Days Discharge Instructions: Secure with Kerlix as directed. Electronic Signature(s) Signed: 06/07/2022 4:32:19 PM By: Shawn Stall RN, BSN Signed: 06/07/2022 4:47:09  PM By: Baltazar Najjar MD Entered By: Shawn Stall on 06/07/2022 12:46:53 -------------------------------------------------------------------------------- Problem List Details Patient Name: Date of Service: [name]. 06/07/2022 12:45 PM Medical Record Number: [id number] Patient Account Number: [id number] Date of Birth/Sex: Treating RN: [birth year] (72 y.o. Debara Pickett, Yvonne Kendall Primary Care Provider: Creola Corn Other Clinician: Referring Provider: Treating Provider/Extender: Lacretia Leigh in Treatment: 11 Active Problems ICD-10 Encounter Code Description Active Date MDM Diagnosis [phone] Non-pressure chronic ulcer of other part of left lower leg with fat layer exposed3/18/2024 No Yes T81.31XA Disruption of external operation (surgical) wound, not elsewhere classified, 03/21/2022 No Yes initial encounter C43.9 Malignant melanoma of skin, unspecified 03/21/2022 No Yes G14 Postpolio syndrome 03/21/2022 No Yes C54.1 Malignant neoplasm of endometrium 03/21/2022 No Yes I89.0 Lymphedema, not elsewhere classified 03/21/2022 No Yes Inactive Problems Resolved Problems Electronic Signature(s) Signed: 06/07/2022 4:47:09 PM By: Baltazar Najjar MD Entered By: Baltazar Najjar on 06/07/2022 12:56:54 Eric Form ([id number]) [id number] Page 4 of 6 -------------------------------------------------------------------------------- Progress Note Details Patient Name: Date of Service: TANITA, CANN. 06/07/2022 12:45 PM Medical Record Number: [id number] Patient Account Number: [id number] Date of Birth/Sex: Treating RN: 04-18-1950 (72 y.o. F) Primary Care Provider: Creola Corn Other Clinician: Referring Provider: Treating Provider/Extender: Lacretia Leigh in Treatment:  11 Subjective History of Present Illness (HPI) 03/21/2022 Ms. Amabella Petithomme is a 72 year old female with a past medical history of postpolio syndrome, endometrial cancer and essential hypertension that presents the clinic for a 1 month history of nonhealing ulcer to the left lower extremity. She states that she had a melanoma removed from the left lower extremity on 2/27 by Dr. Irene Limbo. After the surgery the wound dehisced. They have been using compression wraps. It is unclear what dressing has been used. She has no issues or complaints today. She denies signs of infection. 3/25; Patient presents for follow-up. We have been using Santyl and Hydrofera Blue under Kerlix/Coban. There is been improvement in wound healing. She had no issues with the compression wrap. 4/8; patient presents for follow-up. We have been using Santyl Hydrofera Blue under Kerlix/Coban. Wound continues to improve in size and appearance. She still has slough buildup. I think in the near future she would do well with a skin substitute. We discussed this and patient was agreeable to proceed with insurance verification. We will go ahead and run her for PuraPly and Grafix. 4/15; patient presents for follow-up. We have been using Santyl and Hydrofera Blue under Kerlix/Coban. The wound is smaller. She has been approved for PuraPly and she is agreeable to proceed with placement today. She denies signs of infection. 4/22; patient presents for follow-up. We placed PuraPly on the wound bed at last clinic visit under Kerlix/Coban. Unfortunately the PuraPly dried out. She denies signs of infection. 4/29; patient presents for follow-up. We placed Grafix on the wound bed at last clinic visit under Kerlix/Coban. Again the wound bed appears dry. No signs of infection. 6/5; patient with a wound on her left anterior lower leg in the setting of Mohs surgery for a malignant melanoma. She has been using Santyl Hydrofera Blue kerlix Coban. She went  for her arterial studies at [facility]. She was not able to tolerate an ABI however her TBI was 1.16 in the normal range and the left dorsalis pedis pulse waveform was within normal limits therefore she does not seem to have significant arterial issues. Her wound is measuring smaller 5/13; patient presents for follow-up. We have been using  Santyl and Hydrofera Blue under Kerlix/Coban. She has done well with this. Her wound is smaller. The next week she will be out of town and does not want the compression wrap on. I recommended using Hydrofera Blue 3 other day with her compression stocking. 5/23; patient presents for follow up. Due to an out-of-town trip we held off on doing the in office compression wrap and she used her compression stocking daily. She has been using Hydrofera Blue. She has no issues or complaints today. Wound is stable and measurement however there is more recent tissue at the surface. 06-01-2022 upon evaluation today patient appears to be doing well currently in regard to her wound. In fact the switch to collagen seems to have done very well for her over the past week. Fortunately I do not see any signs of active infection locally nor systemically which is great news. 6/4; patient continues to do nicely with the surgical wound on her left anterior lower leg she has been using Prisma with kerlix Coban compression. Objective Constitutional Vitals Time Taken: 12:45 PM, Height: 62 in, Weight: 148 lbs, BMI: 27.1, Temperature: 98.1 F, Pulse: 76 bpm, Respiratory Rate: 20 breaths/min, Blood Pressure: 125/80 mmHg. Integumentary (Hair, Skin) Wound #1 status is Open. Original cause of wound was Surgical Injury. The date acquired was: 03/01/2022. The wound has been in treatment 11 weeks. The wound is located on the Left,Anterior Lower Leg. The wound measures 1.5cm length x 0.9cm width x 0.1cm depth; 1.06cm^2 area and 0.106cm^3 volume. There is Fat Layer (Subcutaneous Tissue) exposed.  There is no tunneling or undermining noted. There is a medium amount of serosanguineous drainage noted. The wound margin is distinct with the outline attached to the wound base. There is large (67-100%) red, pink granulation within the wound bed. There is no necrotic tissue within the wound bed. The periwound skin appearance did not exhibit: Callus, Crepitus, Excoriation, Induration, Rash, Scarring, Dry/Scaly, Maceration, Atrophie Blanche, Cyanosis, Ecchymosis, Hemosiderin Staining, Mottled, Pallor, Rubor, Erythema. Periwound temperature was noted as No Abnormality. SHAYLON, PLASTER ([id number]) [id number] Page 5 of 6 Assessment Active Problems ICD-10 Non-pressure chronic ulcer of other part of left lower leg with fat layer exposed Disruption of external operation (surgical) wound, not elsewhere classified, initial encounter Malignant melanoma of skin, unspecified Postpolio syndrome Malignant neoplasm of endometrium Lymphedema, not elsewhere classified Plan Follow-up Appointments: Return Appointment in 1 week. - Dr. Mikey Bussing room 8 06/14/2022 tuesday 1100 Return Appointment in 2 weeks. - Dr. Mikey Bussing Tuesday 145pm 06/14/2022 room 8 Anesthetic: (In clinic) Topical Lidocaine 4% applied to wound bed - used in clinic Bathing/ Shower/ Hygiene: May shower with protection but do not get wound dressing(s) wet. Protect dressing(s) with water repellant cover (for example, large plastic bag) or a cast cover and may then take shower. Edema Control - Lymphedema / SCD / Other: Elevate legs to the level of the heart or above for 30 minutes daily and/or when sitting for 3-4 times a day throughout the day. Avoid standing for long periods of time. Patient to wear own compression stockings every day. - apply in the morning and remove at night. Exercise regularly - Walk/exercise a s tolerated throughout the day Moisturize legs daily. WOUND #1: - Lower Leg Wound Laterality: Left,  Anterior Cleanser: Soap and Water Every Other Day/30 Days Discharge Instructions: May shower and wash wound with dial antibacterial soap and water prior to dressing change. Cleanser: Vashe 5.8 (oz) Every Other Day/30 Days Discharge Instructions: Cleanse the wound with Vashe prior to applying  a clean dressing using gauze sponges, not tissue or cotton balls. Peri-Wound Care: Sween Lotion (Moisturizing lotion) Every Other Day/30 Days Discharge Instructions: Apply moisturizing lotion as directed Topical: Skintegrity Hydrogel 4 (oz) Every Other Day/30 Days Discharge Instructions: Apply hydrogel as directed Prim Dressing: Promogran Prisma Matrix, 4.34 (sq in) (silver collagen) Every Other Day/30 Days ary Discharge Instructions: Moisten collagen with saline or hydrogel Secured With: Coban Self-Adherent Wrap 4x5 (in/yd) Every Other Day/30 Days Discharge Instructions: Secure with Coban as directed. Secured With: [employer], 4.5x3.1 (in/yd) Every Other Day/30 Days Discharge Instructions: Secure with Kerlix as directed. We are continuing with the same dressing which is silver collagen under kerlix Coban compression. She is making excellent progress known age need to change the primary dressing. She will follow-up with dermatology in August Electronic Signature(s) Signed: 06/07/2022 4:47:09 PM By: Baltazar Najjar MD Entered By: Baltazar Najjar on 06/07/2022 12:59:14 -------------------------------------------------------------------------------- SuperBill Details Patient Name: Date of Service: [name]. 06/07/2022 Medical Record Number: [id number] Patient Account Number: [id number] Date of Birth/Sex: Treating RN: [birth year] (72 y.o. F) Primary Care Provider: Creola Corn Other Clinician: Referring Provider: Treating Provider/Extender: Lacretia Leigh in Treatment: 11 Diagnosis Coding ICD-10 Codes Code Description [phone] Non-pressure chronic ulcer of other part of  left lower leg with fat layer exposed JONA, MEDITZ ([id number]) [id number] Page 6 of 6 T81.31XA Disruption of external operation (surgical) wound, not elsewhere classified, initial encounter C43.9 Malignant melanoma of skin, unspecified G14 Postpolio syndrome C54.1 Malignant neoplasm of endometrium I89.0 Lymphedema, not elsewhere classified Facility Procedures : CPT4 Code: [id number] Description: 99213 - WOUND CARE VISIT-LEV 3 EST PT Modifier: Quantity: 1 Physician Procedures : CPT4 Code Description Modifier [id number] 99213 - WC PHYS LEVEL 3 - EST PT ICD-10 Diagnosis Description L97.822 Non-pressure chronic ulcer of other part of left lower leg with fat layer exposed T81.31XA Disruption of external operation (surgical) wound,  not elsewhere classified, initial encounter Quantity: 1 Electronic Signature(s) Signed: 07/04/2022 9:06:19 AM By: Pearletha Alfred Signed: 07/05/2022 3:35:02 PM By: Baltazar Najjar MD Previous Signature: 06/07/2022 4:47:09 PM Version By: Baltazar Najjar MD Entered By: Pearletha Alfred on 07/04/2022 09:06:18

## 2022-07-06 DIAGNOSIS — J3089 Other allergic rhinitis: Secondary | ICD-10-CM | POA: Diagnosis not present

## 2022-07-06 DIAGNOSIS — J3081 Allergic rhinitis due to animal (cat) (dog) hair and dander: Secondary | ICD-10-CM | POA: Diagnosis not present

## 2022-07-06 DIAGNOSIS — J301 Allergic rhinitis due to pollen: Secondary | ICD-10-CM | POA: Diagnosis not present

## 2022-07-06 NOTE — Progress Notes (Signed)
MAURICIA, CRITTON ([id number]) [id number] Page 1 of 1 Visit Report for 07/05/2022 SuperBill Details Patient Name: Date of Service: [name], [name]. 07/05/2022 Medical Record Number: [id number] Patient Account Number: [id number] Date of Birth/Sex: Treating RN: 08-14-1950 (72 y.o. F) Primary Care Provider: Creola Corn Other Clinician: Referring Provider: Treating Provider/Extender: Mikki Santee in Treatment: 15 Diagnosis Coding ICD-10 Codes Code Description [phone] Non-pressure chronic ulcer of other part of left lower leg with fat layer exposed Disruption of external operation (surgical) wound, not elsewhere classified, initial T81.31XA encounter C43.9 Malignant melanoma of skin, unspecified G14 Postpolio syndrome C54.1 Malignant neoplasm of endometrium I89.0 Lymphedema, not elsewhere classified Facility Procedures CPT4 Code Description Modifier Quantity [id number] [phone] - WOUND CARE VISIT-LEV 2 EST PT 1 Electronic Signature(s) Signed: 07/05/2022 5:11:40 PM By: Thayer Dallas Signed: 07/06/2022 7:55:03 AM By: Duanne Guess MD FACS Entered By: Thayer Dallas on 07/05/2022 16:44:29

## 2022-07-12 ENCOUNTER — Other Ambulatory Visit: Payer: Self-pay | Admitting: Endocrinology

## 2022-07-12 ENCOUNTER — Encounter (HOSPITAL_BASED_OUTPATIENT_CLINIC_OR_DEPARTMENT_OTHER): Payer: Medicare Other | Admitting: Internal Medicine

## 2022-07-12 DIAGNOSIS — C439 Malignant melanoma of skin, unspecified: Secondary | ICD-10-CM

## 2022-07-12 DIAGNOSIS — I890 Lymphedema, not elsewhere classified: Secondary | ICD-10-CM | POA: Diagnosis not present

## 2022-07-12 DIAGNOSIS — T8131XA Disruption of external operation (surgical) wound, not elsewhere classified, initial encounter: Secondary | ICD-10-CM

## 2022-07-12 DIAGNOSIS — I10 Essential (primary) hypertension: Secondary | ICD-10-CM | POA: Diagnosis not present

## 2022-07-12 DIAGNOSIS — E041 Nontoxic single thyroid nodule: Secondary | ICD-10-CM

## 2022-07-12 DIAGNOSIS — E785 Hyperlipidemia, unspecified: Secondary | ICD-10-CM | POA: Diagnosis not present

## 2022-07-12 DIAGNOSIS — G14 Postpolio syndrome: Secondary | ICD-10-CM | POA: Diagnosis not present

## 2022-07-12 DIAGNOSIS — L97822 Non-pressure chronic ulcer of other part of left lower leg with fat layer exposed: Secondary | ICD-10-CM

## 2022-07-12 NOTE — Progress Notes (Signed)
SINDY, NORFLEET ([id number]) [id number] Page 1 of 7 Visit Report for 07/12/2022 Chief Complaint Document Details Patient Name: Date of Service: Desiree Desiree, [name]. 07/12/2022 11:00 A M Medical Record Number: [id number] Patient Account Number: [id number] Date of Birth/Sex: Treating RN: 04/15/1950 (72 y.o. F) Primary Care Provider: Creola Corn Other Clinician: Referring Provider: Treating Provider/Extender: Octavia Bruckner in Treatment: 16 Information Obtained from: Patient Chief Complaint 03/21/2022; left lower extremity wound Electronic Signature(s) Signed: 07/12/2022 2:56:31 PM By: Geralyn Corwin DO Entered By: Geralyn Corwin on 07/12/2022 13:41:03 -------------------------------------------------------------------------------- HPI Details Patient Name: Date of Service: [name]. 07/12/2022 11:00 A M Medical Record Number: [id number] Patient Account Number: [id number] Date of Birth/Sex: Treating RN: 06-Jan-1950 (72 y.o. F) Primary Care Provider: Creola Corn Other Clinician: Referring Provider: Treating Provider/Extender: Octavia Bruckner in Treatment: 16 History of Present Illness HPI Description: 03/21/2022 Ms. [name] is a 72 year old female with a past medical history of postpolio syndrome, endometrial cancer and essential hypertension that presents the clinic for a 1 month history of nonhealing ulcer to the left lower extremity. She states that she had a melanoma removed from the left lower extremity on 2/27 by Dr. Irene Limbo. After the surgery the wound dehisced. They have been using compression wraps. It is unclear what dressing has been used. She has no issues or complaints today. She denies signs of infection. 3/25; Patient presents for follow-up. We have been using Santyl and Hydrofera Blue under Kerlix/Coban. There is been improvement in wound healing. She had no issues with the compression wrap. 4/8;  patient presents for follow-up. We have been using Santyl Hydrofera Blue under Kerlix/Coban. Wound continues to improve in size and appearance. She still has slough buildup. I think in the near future she would do well with a skin substitute. We discussed this and patient was agreeable to proceed with insurance verification. We will Desiree ahead and run her for PuraPly and Grafix. 4/15; patient presents for follow-up. We have been using Santyl and Hydrofera Blue under Kerlix/Coban. The wound is smaller. She has been approved for PuraPly and she is agreeable to proceed with placement today. She denies signs of infection. 4/22; patient presents for follow-up. We placed PuraPly on the wound bed at last clinic visit under Kerlix/Coban. Unfortunately the PuraPly dried out. She denies signs of infection. 4/29; patient presents for follow-up. We placed Grafix on the wound bed at last clinic visit under Kerlix/Coban. Again the wound bed appears dry. No signs of infection. 6/5; patient with a wound on her left anterior lower leg in the setting of Mohs surgery for a malignant melanoma. She has been using Santyl Hydrofera Blue kerlix Coban. She went for her arterial studies at [facility]. She was not able to tolerate an ABI however her TBI was 1.16 in the normal range and the left dorsalis pedis pulse waveform was within normal limits therefore she does not seem to have significant arterial issues. Her wound is measuring smaller 5/13; patient presents for follow-up. We have been using Santyl and Hydrofera Blue under Kerlix/Coban. She has done well with this. Her wound is smaller. The next week she will be out of town and does not want the compression wrap on. I recommended using Hydrofera Blue 3 other day with her compression stocking. Desiree, [name] ([id number]) [id number] Page 2 of 7 5/23; patient presents for follow up. Due to an out-of-town trip we held off on doing the in  office compression wrap  and she used her compression stocking daily. She has been using Hydrofera Blue. She has no issues or complaints today. Wound is stable and measurement however there is more recent tissue at the surface. 06-01-2022 upon evaluation today patient appears to be doing well currently in regard to her wound. In fact the switch to collagen seems to have done very well for her over the past week. Fortunately I do not see any signs of active infection locally nor systemically which is great news. 6/4; patient continues to do nicely with the surgical wound on her left anterior lower leg she has been using Prisma with kerlix Coban compression. 6/11; patient presents for follow-up. We have been using collagen under Kerlix/Coban. Her wound is smaller. 7/9; patient presents for follow-up. She has been using collagen under her compression stocking. Her wound has healed. Electronic Signature(s) Signed: 07/12/2022 2:56:31 PM By: Geralyn Corwin DO Entered By: Geralyn Corwin on 07/12/2022 13:41:42 -------------------------------------------------------------------------------- Physical Exam Details Patient Name: Date of Service: [name]. 07/12/2022 11:00 A M Medical Record Number: [id number] Patient Account Number: [id number] Date of Birth/Sex: Treating RN: 1950/07/25 (72 y.o. F) Primary Care Provider: Creola Corn Other Clinician: Referring Provider: Treating Provider/Extender: Octavia Bruckner in Treatment: 16 Constitutional respirations regular, non-labored and within target range for patient.. Cardiovascular 2+ dorsalis pedis/posterior tibialis pulses. Psychiatric pleasant and cooperative. Notes Left lower extremity: T the anterior aspect there is epithelization to the previous wound site. o Electronic Signature(s) Signed: 07/12/2022 2:56:31 PM By: Geralyn Corwin DO Entered By: Geralyn Corwin on 07/12/2022  13:42:05 -------------------------------------------------------------------------------- Physician Orders Details Patient Name: Date of Service: [name]. 07/12/2022 11:00 A M Medical Record Number: [id number] Patient Account Number: [id number] Date of Birth/Sex: Treating RN: 12/09/1950 (72 y.o. [name] Primary Care Provider: Creola Corn Other Clinician: Referring Provider: Treating Provider/Extender: Octavia Bruckner in Treatment: 16 Verbal / Phone Orders: No Diagnosis Coding Discharge From [facility] Services Discharge from Wound Care Center - Call if any future wound care needs. wear compression stockings and brace. Edema Control - Lymphedema / SCD / Other JULIAHNA, MORAGNE ([id number]) [id number] Page 3 of 7 Elevate legs to the level of the heart or above for 30 minutes daily and/or when sitting for 3-4 times a day throughout the day. Avoid standing for long periods of time. Patient to wear own compression stockings every day. Exercise regularly Moisturize legs daily. Electronic Signature(s) Signed: 07/12/2022 2:56:31 PM By: Geralyn Corwin DO Entered By: Geralyn Corwin on 07/12/2022 13:42:11 -------------------------------------------------------------------------------- Problem List Details Patient Name: Date of Service: [name]. 07/12/2022 11:00 A M Medical Record Number: [id number] Patient Account Number: [id number] Date of Birth/Sex: Treating RN: 15-Jan-1950 (72 y.o. F) Primary Care Provider: Creola Corn Other Clinician: Referring Provider: Treating Provider/Extender: Octavia Bruckner in Treatment: 16 Active Problems ICD-10 Encounter Code Description Active Date MDM Diagnosis [phone] Non-pressure chronic ulcer of other part of left lower leg with fat layer exposed3/18/2024 No Yes T81.31XA Disruption of external operation (surgical) wound, not elsewhere classified, 03/21/2022 No Yes initial  encounter C43.9 Malignant melanoma of skin, unspecified 03/21/2022 No Yes G14 Postpolio syndrome 03/21/2022 No Yes C54.1 Malignant neoplasm of endometrium 03/21/2022 No Yes I89.0 Lymphedema, not elsewhere classified 03/21/2022 No Yes Inactive Problems Resolved Problems Electronic Signature(s) Signed: 07/12/2022 2:56:31 PM By: Geralyn Corwin DO Entered By: Geralyn Corwin on 07/12/2022 13:40:52 [name] ([id number]) [id number] Page 4 of 7 -------------------------------------------------------------------------------- Progress Note Details Patient Name: Date of  Service: Desiree Kins M. 07/12/2022 11:00 A M Medical Record Number: [id number] Patient Account Number: [id number] Date of Birth/Sex: Treating RN: 1950/01/28 (72 y.o. F) Primary Care Provider: Creola Corn Other Clinician: Referring Provider: Treating Provider/Extender: Octavia Bruckner in Treatment: 16 Subjective Chief Complaint Information obtained from Patient 03/21/2022; left lower extremity wound History of Present Illness (HPI) 03/21/2022 Ms. Anacani Wess is a 72 year old female with a past medical history of postpolio syndrome, endometrial cancer and essential hypertension that presents the clinic for a 1 month history of nonhealing ulcer to the left lower extremity. She states that she had a melanoma removed from the left lower extremity on 2/27 by Dr. Irene Limbo. After the surgery the wound dehisced. They have been using compression wraps. It is unclear what dressing has been used. She has no issues or complaints today. She denies signs of infection. 3/25; Patient presents for follow-up. We have been using Santyl and Hydrofera Blue under Kerlix/Coban. There is been improvement in wound healing. She had no issues with the compression wrap. 4/8; patient presents for follow-up. We have been using Santyl Hydrofera Blue under Kerlix/Coban. Wound continues to improve in size and  appearance. She still has slough buildup. I think in the near future she would do well with a skin substitute. We discussed this and patient was agreeable to proceed with insurance verification. We will Desiree ahead and run her for PuraPly and Grafix. 4/15; patient presents for follow-up. We have been using Santyl and Hydrofera Blue under Kerlix/Coban. The wound is smaller. She has been approved for PuraPly and she is agreeable to proceed with placement today. She denies signs of infection. 4/22; patient presents for follow-up. We placed PuraPly on the wound bed at last clinic visit under Kerlix/Coban. Unfortunately the PuraPly dried out. She denies signs of infection. 4/29; patient presents for follow-up. We placed Grafix on the wound bed at last clinic visit under Kerlix/Coban. Again the wound bed appears dry. No signs of infection. 6/5; patient with a wound on her left anterior lower leg in the setting of Mohs surgery for a malignant melanoma. She has been using Santyl Hydrofera Blue kerlix Coban. She went for her arterial studies at vein [facility] vascular. She was not able to tolerate an ABI however her TBI was 1.16 in the normal range and the left dorsalis pedis pulse waveform was within normal limits therefore she does not seem to have significant arterial issues. Her wound is measuring smaller 5/13; patient presents for follow-up. We have been using Santyl and Hydrofera Blue under Kerlix/Coban. She has done well with this. Her wound is smaller. The next week she will be out of town and does not want the compression wrap on. I recommended using Hydrofera Blue 3 other day with her compression stocking. 5/23; patient presents for follow up. Due to an out-of-town trip we held off on doing the in office compression wrap and she used her compression stocking daily. She has been using Hydrofera Blue. She has no issues or complaints today. Wound is stable and measurement however there is more recent tissue  at the surface. 06-01-2022 upon evaluation today patient appears to be doing well currently in regard to her wound. In fact the switch to collagen seems to have done very well for her over the past week. Fortunately I do not see any signs of active infection locally nor systemically which is great news. 6/4; patient continues to do nicely with the surgical wound on her left anterior lower  leg she has been using Prisma with kerlix Coban compression. 6/11; patient presents for follow-up. We have been using collagen under Kerlix/Coban. Her wound is smaller. 7/9; patient presents for follow-up. She has been using collagen under her compression stocking. Her wound has healed. Patient History Information obtained from Patient. Family History Cancer - Mother,Father,Siblings,Child,Paternal Grandparents,Maternal Grandparents, Heart Disease - Mother. Social History Never smoker, Marital Status - Married, Alcohol Use - Rarely, Drug Use - No History, Caffeine Use - Never. Medical History Respiratory Patient has history of Asthma Cardiovascular Patient has history of Hypertension - Unspecified essential HTN Oncologic Patient has history of Received Chemotherapy, Received Radiation - 05/06/19-06/04/19 Vaginal Brachytherapy (endometrialcancer) Hospitalization/Surgery History - Robotic assisted total hysterectomy; Right T hip. otal Medical A Surgical History Notes nd Cardiovascular hyperlipidemia Musculoskeletal Hx: Polio -Left leg wears a brace Neurologic ZO:XWRUE CORRY, FULWILER ([id number]) [id number] Page 5 of 7 Objective Constitutional respirations regular, non-labored and within target range for patient.. Vitals Time Taken: 11:12 AM, Height: 62 in, Weight: 148 lbs, BMI: 27.1, Temperature: 98.6 F, Pulse: 91 bpm, Respiratory Rate: 18 breaths/min, Blood Pressure: 133/80 mmHg. Cardiovascular 2+ dorsalis pedis/posterior tibialis pulses. Psychiatric pleasant and  cooperative. General Notes: Left lower extremity: T the anterior aspect there is epithelization to the previous wound site. o Integumentary (Hair, Skin) Wound #1 status is Healed - Epithelialized. Original cause of wound was Surgical Injury. The date acquired was: 03/01/2022. The wound has been in treatment 16 weeks. The wound is located on the Left,Anterior Lower Leg. The wound measures 0cm length x 0cm width x 0cm depth; 0cm^2 area and 0cm^3 volume. There is no tunneling or undermining noted. There is a medium amount of serosanguineous drainage noted. The periwound skin appearance exhibited: Scarring. The periwound skin appearance did not exhibit: Callus, Crepitus, Excoriation, Induration, Rash, Dry/Scaly, Maceration, Atrophie Blanche, Cyanosis, Ecchymosis, Hemosiderin Staining, Mottled, Pallor, Rubor, Erythema. Periwound temperature was noted as No Abnormality. Assessment Active Problems ICD-10 Non-pressure chronic ulcer of other part of left lower leg with fat layer exposed Disruption of external operation (surgical) wound, not elsewhere classified, initial encounter Malignant melanoma of skin, unspecified Postpolio syndrome Malignant neoplasm of endometrium Lymphedema, not elsewhere classified Patient has done well with collagen and her compression stocking daily. Her wound is healed. She may follow-up as needed. Plan Discharge From [facility] Services: Discharge from Wound Care Center - Call if any future wound care needs. wear compression stockings and brace. Edema Control - Lymphedema / SCD / Other: Elevate legs to the level of the heart or above for 30 minutes daily and/or when sitting for 3-4 times a day throughout the day. Avoid standing for long periods of time. Patient to wear own compression stockings every day. Exercise regularly Moisturize legs daily. 1. Discharge from clinic due to closed wound 2. Follow-up as needed Electronic Signature(s) Signed: 07/12/2022 2:56:31 PM By:  Geralyn Corwin DO Entered By: Geralyn Corwin on 07/12/2022 13:43:12 [name] ([id number]) [id number] Page 6 of 7 -------------------------------------------------------------------------------- HxROS Details Patient Name: Date of Service: [name], [name]. 07/12/2022 11:00 A M Medical Record Number: [id number] Patient Account Number: [id number] Date of Birth/Sex: Treating RN: 07/30/1950 (72 y.o. F) Primary Care Provider: Creola Corn Other Clinician: Referring Provider: Treating Provider/Extender: Octavia Bruckner in Treatment: 16 Information Obtained From Patient Respiratory Medical History: Positive for: Asthma Cardiovascular Medical History: Positive for: Hypertension - Unspecified essential HTN Past Medical History Notes: hyperlipidemia Musculoskeletal Medical History: Past Medical History Notes: Hx: Polio -Left leg wears a brace Neurologic Medical  History: Past Medical History Notes: VH:QIONG Oncologic Medical History: Positive for: Received Chemotherapy; Received Radiation - 05/06/19-06/04/19 Vaginal Brachytherapy (endometrialcancer) Immunizations Pneumococcal Vaccine: Received Pneumococcal Vaccination: Yes Received Pneumococcal Vaccination On or After 60th Birthday: Yes Implantable Devices Yes Hospitalization / Surgery History Type of Hospitalization/Surgery Robotic assisted total hysterectomy; Right T hip otal Family and Social History Cancer: Yes - Mother,Father,Siblings,Child,Paternal Grandparents,Maternal Grandparents; Heart Disease: Yes - Mother; Never smoker; Marital Status - Married; Alcohol Use: Rarely; Drug Use: No History; Caffeine Use: Never; Financial Concerns: No; Food, Clothing or Shelter Needs: No; Support System Lacking: No; Transportation Concerns: No Electronic Signature(s) Signed: 07/12/2022 2:56:31 PM By: Geralyn Corwin DO Entered By: Geralyn Corwin on 07/12/2022 13:41:46 [name]  ([id number]) [id number] Page 7 of 7 -------------------------------------------------------------------------------- SuperBill Details Patient Name: Date of Service: Desiree AKEYA, [name]. 07/12/2022 Medical Record Number: [id number] Patient Account Number: [id number] Date of Birth/Sex: Treating RN: 08-12-1950 (72 y.o. [name] Primary Care Provider: Creola Corn Other Clinician: Referring Provider: Treating Provider/Extender: Octavia Bruckner in Treatment: 16 Diagnosis Coding ICD-10 Codes Code Description [phone] Non-pressure chronic ulcer of other part of left lower leg with fat layer exposed T81.31XA Disruption of external operation (surgical) wound, not elsewhere classified, initial encounter C43.9 Malignant melanoma of skin, unspecified G14 Postpolio syndrome C54.1 Malignant neoplasm of endometrium I89.0 Lymphedema, not elsewhere classified Facility Procedures : CPT4 Code: [id number] Description: 99213 - WOUND CARE VISIT-LEV 3 EST PT Modifier: Quantity: 1 Physician Procedures : CPT4 Code Description Modifier [id number] 99213 - WC PHYS LEVEL 3 - EST PT ICD-10 Diagnosis Description L97.822 Non-pressure chronic ulcer of other part of left lower leg with fat layer exposed T81.31XA Disruption of external operation (surgical) wound,  not elsewhere classified, initial encounter C43.9 Malignant melanoma of skin, unspecified I89.0 Lymphedema, not elsewhere classified Quantity: 1 Electronic Signature(s) Signed: 07/12/2022 2:56:31 PM By: Geralyn Corwin DO Entered By: Geralyn Corwin on 07/12/2022 13:43:52

## 2022-07-13 DIAGNOSIS — J301 Allergic rhinitis due to pollen: Secondary | ICD-10-CM | POA: Diagnosis not present

## 2022-07-13 DIAGNOSIS — J3089 Other allergic rhinitis: Secondary | ICD-10-CM | POA: Diagnosis not present

## 2022-07-13 NOTE — Progress Notes (Signed)
[name], [name] ([id number]) [id number] Page 1 of 7 Visit Report for 07/12/2022 Arrival Information Details Patient Name: Date of Service: [name], [name]. 07/12/2022 11:00 A M Medical Record Number: [id number] Patient Account Number: [id number] Date of Birth/Sex: Treating RN: 1950-05-29 (72 y.o. F) Primary Care Kendric Sindelar: Creola Corn Other Clinician: Referring Shykeem Resurreccion: Treating Johnedward Brodrick/Extender: Octavia Bruckner in Treatment: 16 Visit Information History Since Last Visit Added or deleted any medications: No Patient Arrived: Ambulatory Any new allergies or adverse reactions: No Arrival Time: 11:08 Had a fall or experienced change in No Accompanied By: self activities of daily living that may affect Transfer Assistance: None risk of falls: Patient Identification Verified: Yes Signs or symptoms of abuse/neglect since last visito No Secondary Verification Process Completed: Yes Hospitalized since last visit: No Patient Requires Transmission-Based Precautions: No Implantable device outside of the clinic excluding No Patient Has Alerts: No cellular tissue based products placed in the center since last visit: Has Dressing in Place as Prescribed: Yes Has Compression in Place as Prescribed: Yes Pain Present Now: No Electronic Signature(s) Signed: 07/12/2022 4:04:59 PM By: Thayer Dallas Entered By: Thayer Dallas on 07/12/2022 11:12:31 -------------------------------------------------------------------------------- Clinic Level of Care Assessment Details Patient Name: Date of Service: [name], [name]. 07/12/2022 11:00 A M Medical Record Number: [id number] Patient Account Number: [id number] Date of Birth/Sex: Treating RN: March 26, 1950 (72 y.o. [name] Primary Care Laquasia Pincus: Creola Corn Other Clinician: Referring Evyn Kooyman: Treating Lanessa Shill/Extender: Octavia Bruckner in Treatment: 16 Clinic Level of Care Assessment  Items TOOL 4 Quantity Score X- 1 0 Use when only an EandM is performed on FOLLOW-UP visit ASSESSMENTS - Nursing Assessment / Reassessment X- 1 10 Reassessment of Co-morbidities (includes updates in patient status) X- 1 5 Reassessment of Adherence to Treatment Plan ASSESSMENTS - Wound and Skin A ssessment / Reassessment X - Simple Wound Assessment / Reassessment - one wound 1 5 []  - 0 Complex Wound Assessment / Reassessment - multiple wounds []  - 0 Dermatologic / Skin Assessment (not related to wound area) ASSESSMENTS - Focused Assessment X- 1 5 Circumferential Edema Measurements - multi extremities []  - 0 Nutritional Assessment / Counseling / Intervention Desiree, [name] ([id number]) [id number] Page 2 of 7 []  - 0 Lower Extremity Assessment (monofilament, tuning fork, pulses) []  - 0 Peripheral Arterial Disease Assessment (using hand held doppler) ASSESSMENTS - Ostomy and/or Continence Assessment and Care []  - 0 Incontinence Assessment and Management []  - 0 Ostomy Care Assessment and Management (repouching, etc.) PROCESS - Coordination of Care X - Simple Patient / Family Education for ongoing care 1 15 []  - 0 Complex (extensive) Patient / Family Education for ongoing care X- 1 10 Staff obtains Chiropractor, Records, T Results / Process Orders est []  - 0 Staff telephones HHA, Nursing Homes / Clarify orders / etc []  - 0 Routine Transfer to another Facility (non-emergent condition) []  - 0 Routine Hospital Admission (non-emergent condition) []  - 0 New Admissions / Manufacturing engineer / Ordering NPWT Apligraf, etc. , []  - 0 Emergency Hospital Admission (emergent condition) X- 1 10 Simple Discharge Coordination []  - 0 Complex (extensive) Discharge Coordination PROCESS - Special Needs []  - 0 Pediatric / Minor Patient Management []  - 0 Isolation Patient Management []  - 0 Hearing / Language / Visual special needs []  - 0 Assessment of  Community assistance (transportation, D/C planning, etc.) []  - 0 Additional assistance / Altered mentation []  - 0 Support Surface(s) Assessment (bed, cushion, seat, etc.) INTERVENTIONS - Wound Cleansing /  Measurement X - Simple Wound Cleansing - one wound 1 5 []  - 0 Complex Wound Cleansing - multiple wounds X- 1 5 Wound Imaging (photographs - any number of wounds) []  - 0 Wound Tracing (instead of photographs) X- 1 5 Simple Wound Measurement - one wound []  - 0 Complex Wound Measurement - multiple wounds INTERVENTIONS - Wound Dressings []  - 0 Small Wound Dressing one or multiple wounds []  - 0 Medium Wound Dressing one or multiple wounds []  - 0 Large Wound Dressing one or multiple wounds []  - 0 Application of Medications - topical []  - 0 Application of Medications - injection INTERVENTIONS - Miscellaneous []  - 0 External ear exam []  - 0 Specimen Collection (cultures, biopsies, blood, body fluids, etc.) []  - 0 Specimen(s) / Culture(s) sent or taken to Lab for analysis []  - 0 Patient Transfer (multiple staff / Nurse, adult / Similar devices) []  - 0 Simple Staple / Suture removal (25 or less) []  - 0 Complex Staple / Suture removal (26 or more) []  - 0 Hypo / Hyperglycemic Management (close monitor of Blood Glucose) [name], [name] ([id number]) [id number] Page 3 of 7 []  - 0 Ankle / Brachial Index (ABI) - do not check if billed separately X- 1 5 Vital Signs Has the patient been seen at the hospital within the last three years: Yes Total Score: 80 Level Of Care: New/Established - Level 3 Electronic Signature(s) Signed: 07/12/2022 5:26:31 PM By: Shawn Stall RN, BSN Entered By: Shawn Stall on 07/12/2022 11:51:24 -------------------------------------------------------------------------------- Encounter Discharge Information Details Patient Name: Date of Service: [name]. 07/12/2022 11:00 A M Medical Record Number: [id number] Patient Account  Number: [id number] Date of Birth/Sex: Treating RN: Dec 04, 1950 (72 y.o. [name], [name] Primary Care Romyn Boswell: Creola Corn Other Clinician: Referring Franchesca Veneziano: Treating Naia Ruff/Extender: Octavia Bruckner in Treatment: 16 Encounter Discharge Information Items Discharge Condition: Stable Ambulatory Status: Ambulatory Discharge Destination: Home Transportation: Private Auto Accompanied By: self Schedule Follow-up Appointment: No Clinical Summary of Care: Electronic Signature(s) Signed: 07/12/2022 5:26:31 PM By: Shawn Stall RN, BSN Entered By: Shawn Stall on 07/12/2022 11:52:08 -------------------------------------------------------------------------------- Lower Extremity Assessment Details Patient Name: Date of Service: Desiree David Stall. 07/12/2022 11:00 A M Medical Record Number: [id number] Patient Account Number: [id number] Date of Birth/Sex: Treating RN: Aug 06, 1950 (72 y.o. F) Primary Care Jahmad Petrich: Creola Corn Other Clinician: Referring Dava Rensch: Treating Khaleem Burchill/Extender: Octavia Bruckner in Treatment: 16 Edema Assessment Assessed: Kyra Searles: No] Franne Forts: No] Edema: [Left: N] [Right: o] Calf Left: Right: Point of Measurement: 27 cm From Medial Instep 32 cm Ankle Left: Right: Point of Measurement: 9 cm From Medial Instep 24.5 cm Electronic Signature(s) [name], [name] ([id number]) [id number] Page 4 of 7 Signed: 07/12/2022 4:04:59 PM By: Thayer Dallas Entered By: Thayer Dallas on 07/12/2022 11:18:21 -------------------------------------------------------------------------------- Multi Wound Chart Details Patient Name: Date of Service: [name]. 07/12/2022 11:00 A M Medical Record Number: [id number] Patient Account Number: [id number] Date of Birth/Sex: Treating RN: 1950-11-04 (72 y.o. F) Primary Care Travares Nelles: Creola Corn Other Clinician: Referring Antolin Belsito: Treating Lalana Wachter/Extender: Octavia Bruckner in Treatment: 16 Vital Signs Height(in): 62 Pulse(bpm): 91 Weight(lbs): 148 Blood Pressure(mmHg): 133/80 Body Mass Index(BMI): 27.1 Temperature(F): 98.6 Respiratory Rate(breaths/min): 18 [1:Photos:] [N/A:N/A] Left, Anterior Lower Leg N/A N/A Wound Location: Surgical Injury N/A N/A Wounding Event: Dehisced Wound N/A N/A Primary Etiology: Asthma, Hypertension, Received N/A N/A Comorbid History: Chemotherapy, Received Radiation 03/01/2022 N/A N/A Date Acquired: 16 N/A N/A Weeks of Treatment: Healed -  Epithelialized N/A N/A Wound Status: No N/A N/A Wound Recurrence: 0x0x0 N/A N/A Measurements L x W x D (cm) 0 N/A N/A A (cm) : rea 0 N/A N/A Volume (cm) : 100.00% N/A N/A % Reduction in Area: 100.00% N/A N/A % Reduction in Volume: Full Thickness Without Exposed N/A N/A Classification: Support Structures Medium N/A N/A Exudate A mount: Serosanguineous N/A N/A Exudate Type: red, brown N/A N/A Exudate Color: Large (67-100%) N/A N/A Epithelialization: Scarring: Yes N/A N/A Periwound Skin Texture: Excoriation: No Induration: No Callus: No Crepitus: No Rash: No Maceration: No N/A N/A Periwound Skin Moisture: Dry/Scaly: No Atrophie Blanche: No N/A N/A Periwound Skin Color: Cyanosis: No Ecchymosis: No Erythema: No Hemosiderin Staining: No Mottled: No Pallor: No Rubor: No No Abnormality N/A N/A Temperature: Treatment Notes [name], [name] ([id number]) [phone].pdf Page 5 of 7 Electronic Signature(s) Signed: 07/12/2022 2:56:31 PM By: Geralyn Corwin DO Entered By: Geralyn Corwin on 07/12/2022 13:40:57 -------------------------------------------------------------------------------- Multi-Disciplinary Care Plan Details Patient Name: Date of Service: [name]. 07/12/2022 11:00 A M Medical Record Number: [id number] Patient Account Number: [id number] Date of Birth/Sex: Treating RN: September 13, 1950 (72 y.o. Gevena Mart Primary Care Chadd Tollison: Creola Corn Other Clinician: Referring Grover Robinson: Treating Aivy Akter/Extender: Octavia Bruckner in Treatment: 16 Active Inactive Electronic Signature(s) Signed: 07/12/2022 5:26:31 PM By: Shawn Stall RN, BSN Signed: 07/13/2022 4:28:57 PM By: Brenton Grills Entered By: Shawn Stall on 07/12/2022 11:51:03 -------------------------------------------------------------------------------- Pain Assessment Details Patient Name: Date of Service: [name]. 07/12/2022 11:00 A M Medical Record Number: [id number] Patient Account Number: [id number] Date of Birth/Sex: Treating RN: 1950/05/24 (72 y.o. F) Primary Care Ebonee Stober: Creola Corn Other Clinician: Referring Anitta Tenny: Treating Mustapha Colson/Extender: Octavia Bruckner in Treatment: 16 Active Problems Location of Pain Severity and Description of Pain Patient Has Paino No Site Locations Pain Management and Medication Current Pain Management: JERENE, YEAGER ([id number]) [phone].pdf Page 6 of 7 Electronic Signature(s) Signed: 07/12/2022 4:04:59 PM By: Thayer Dallas Entered By: Thayer Dallas on 07/12/2022 11:13:20 -------------------------------------------------------------------------------- Patient/Caregiver Education Details Patient Name: Date of Service: [name] 7/9/2024andnbsp11:00 A M Medical Record Number: [id number] Patient Account Number: [id number] Date of Birth/Gender: Treating RN: 1950-07-11 (72 y.o. Gevena Mart Primary Care Physician: Creola Corn Other Clinician: Referring Physician: Treating Physician/Extender: Octavia Bruckner in Treatment: 16 Education Assessment Education Provided To: Patient Education Topics Provided Wound/Skin Impairment: Methods: Explain/Verbal Responses: State content correctly [employer]) Signed: 07/13/2022 4:28:57 PM By: Brenton Grills Entered By:  Brenton Grills on 07/12/2022 11:32:05 -------------------------------------------------------------------------------- Wound Assessment Details Patient Name: Date of Service: [name]. 07/12/2022 11:00 A M Medical Record Number: [id number] Patient Account Number: [id number] Date of Birth/Sex: Treating RN: 05-26-1950 (72 y.o. [name] Primary Care Lashara Urey: Creola Corn Other Clinician: Referring Arianna Haydon: Treating Juda Toepfer/Extender: Octavia Bruckner in Treatment: 16 Wound Status Wound Number: 1 Primary Dehisced Wound Etiology: Wound Location: Left, Anterior Lower Leg Wound Status: Healed - Epithelialized Wounding Event: Surgical Injury Notes: pt. had a Melanoma removed by Dr. Irene Limbo Date Acquired: 03/01/2022 Comorbid Asthma, Hypertension, Received Chemotherapy, Received Weeks Of Treatment: 16 History: Radiation Clustered Wound: No Photos [name], [name] ([id number]) [id number] Page 7 of 7 Wound Measurements Length: (cm) Width: (cm) Depth: (cm) Area: (cm) Volume: (cm) 0 % Reduction in Area: 100% 0 % Reduction in Volume: 100% 0 Epithelialization: Large (67-100%) 0 Tunneling: No 0 Undermining: No Wound Description Classification: Full Thickness Without Exposed Support Structures Exudate Amount: Medium Exudate  Type: Serosanguineous Exudate Color: red, brown Foul Odor After Cleansing: No Slough/Fibrino No Periwound Skin Texture Texture Color No Abnormalities Noted: No No Abnormalities Noted: No Callus: No Atrophie Blanche: No Crepitus: No Cyanosis: No Excoriation: No Ecchymosis: No Induration: No Erythema: No Rash: No Hemosiderin Staining: No Scarring: Yes Mottled: No Pallor: No Moisture Rubor: No No Abnormalities Noted: No Dry / Scaly: No Temperature / Pain Maceration: No Temperature: No Abnormality Electronic Signature(s) Signed: 07/12/2022 5:26:31 PM By: Shawn Stall RN, BSN Entered By: Shawn Stall on 07/12/2022 11:50:02 -------------------------------------------------------------------------------- Vitals Details Patient Name: Date of Service: [name]. 07/12/2022 11:00 A M Medical Record Number: [id number] Patient Account Number: [id number] Date of Birth/Sex: Treating RN: Mar 23, 1950 (72 y.o. F) Primary Care [place] Yearby: Creola Corn Other Clinician: Referring Antwon Rochin: Treating Ulus Hazen/Extender: Octavia Bruckner in Treatment: 16 Vital Signs Time Taken: 11:12 Temperature (F): 98.6 Height (in): 62 Pulse (bpm): 91 Weight (lbs): 148 Respiratory Rate (breaths/min): 18 Body Mass Index (BMI): 27.1 Blood Pressure (mmHg): 133/80 Reference Range: 80 - 120 mg / dl Electronic Signature(s) Signed: 07/12/2022 4:04:59 PM By: Thayer Dallas Entered By: Thayer Dallas on 07/12/2022 11:13:07

## 2022-07-18 ENCOUNTER — Other Ambulatory Visit: Payer: Self-pay | Admitting: Pulmonary Disease

## 2022-07-19 ENCOUNTER — Ambulatory Visit
Admission: RE | Admit: 2022-07-19 | Discharge: 2022-07-19 | Disposition: A | Discharge status: Home / Self Care | Payer: Medicare Other | Source: Ambulatory Visit | Attending: Endocrinology | Admitting: Endocrinology

## 2022-07-19 DIAGNOSIS — E041 Nontoxic single thyroid nodule: Secondary | ICD-10-CM | POA: Diagnosis not present

## 2022-07-21 DIAGNOSIS — J3089 Other allergic rhinitis: Secondary | ICD-10-CM | POA: Diagnosis not present

## 2022-07-21 DIAGNOSIS — J301 Allergic rhinitis due to pollen: Secondary | ICD-10-CM | POA: Diagnosis not present

## 2022-07-21 DIAGNOSIS — J3081 Allergic rhinitis due to animal (cat) (dog) hair and dander: Secondary | ICD-10-CM | POA: Diagnosis not present

## 2022-07-28 ENCOUNTER — Other Ambulatory Visit: Payer: Self-pay | Admitting: Endocrinology

## 2022-07-28 DIAGNOSIS — E041 Nontoxic single thyroid nodule: Secondary | ICD-10-CM

## 2022-07-29 DIAGNOSIS — J3089 Other allergic rhinitis: Secondary | ICD-10-CM | POA: Diagnosis not present

## 2022-07-29 DIAGNOSIS — J3081 Allergic rhinitis due to animal (cat) (dog) hair and dander: Secondary | ICD-10-CM | POA: Diagnosis not present

## 2022-07-29 DIAGNOSIS — J301 Allergic rhinitis due to pollen: Secondary | ICD-10-CM | POA: Diagnosis not present

## 2022-07-30 NOTE — Progress Notes (Signed)
Chief Complaint: Patient was seen in virtual consultation today for symptomatic thyroid nodule  Referring Physician(s): Balan,Bindubal  History of Present Illness: [name] is a 72 y.o. female with a medical history significant for HTN, polio, endometrial cancer s/p robotic hysterectomy with bilateral salpingo oophorectomy and VBT and melanoma s/p excision earlier this year. She also has a history of a right mid thyroid cyst which was first identified in 2023. A recent repeat ultrasound shows the cyst  has enlarged and the patient is interested in pursuing aspiration with possible ablation. She has been referred to Interventional Radiology by her primary care physician Dr. Talmage Nap and she presents today via virtual telephone visit for further discussion.   Thyroid ultrasounds in June 2023 and July 2024 have demonstrated an enlarging simple right cyst, now measuring up to 3.8 cm.  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  She also endorses a pressure sensation when swallowing.  This has gradually onset over years.  She has never had hypo- or hyper-thyroid symptoms or laboratory evidence to prove anything but euthyroid.     Past Medical History:  Diagnosis Date   Allergic rhinitis    Arthritis    Asthma    Carpal tunnel syndrome on right    Endometrial cancer (HCC)    Family history of breast cancer    Family history of colon cancer    Family history of kidney cancer    Family history of stomach cancer    GERD (gastroesophageal reflux disease)    History of radiation therapy 06/04/2019   vaginal brachytherapy 05/06/2019-06/04/2019   Dr Antony Blackbird   Hyperlipemia    PMB (postmenopausal bleeding)    Polio    Left leg   Unspecified essential hypertension    clearance Dr Everardo Beals with note on chart    Past Surgical History:  Procedure Laterality Date   BREAST CYST ASPIRATION Right 2004   BREAST EXCISIONAL BIOPSY Bilateral     BREAST SURGERY     bilateral fibroid tumors removed   CARPAL TUNNEL RELEASE     right   CESAREAN SECTION     x 2   COLON SURGERY     scar tissue removed   COLONOSCOPY     leg surgery     bilateral, numerous   LYMPH NODE DISSECTION N/A 03/20/2019   Procedure: LYMPH NODE DISSECTION;  Surgeon: Carver Fila, MD;  Location: [facility] ;  Service: Gynecology;  Laterality: N/A;   ROBOTIC ASSISTED TOTAL HYSTERECTOMY WITH BILATERAL SALPINGO OOPHERECTOMY N/A 03/20/2019   Procedure: XI ROBOTIC ASSISTED TOTAL HYSTERECTOMY WITH BILATERAL SALPINGO OOPHORECTOMY;  Surgeon: Carver Fila, MD;  Location: [facility];  Service: Gynecology;  Laterality: N/A;   SENTINEL NODE BIOPSY  03/20/2019   Procedure: SENTINEL NODE BIOPSY;  Surgeon: Carver Fila, MD;  Location: [facility];  Service: Gynecology;;   tooth implant     titanium/ front upper right   TOTAL HIP ARTHROPLASTY     right   TOTAL HIP REVISION  08/15/2011   Procedure: TOTAL HIP REVISION;  Surgeon: Shelda Pal, MD;  Location: [facility];  Service: Orthopedics;  Laterality: Right;   vocal surgery     cyst removed   WRIST SURGERY  2009   left    Allergies: Molds & smuts and Latex  Medications: Prior to Admission medications   Medication Sig Start Date End Date Taking? Authorizing  Provider  albuterol (PROVENTIL) (2.5 MG/3ML) 0.083% nebulizer solution Take 3 mLs (2.5 mg total) by nebulization every 6 (six) hours as needed for wheezing or shortness of breath. 02/26/18   Parrett, Virgel Bouquet, NP  amLODipine (NORVASC) 2.5 MG tablet Take 2.5 mg by mouth daily.    [provider]  Ca Phosphate-Cholecalciferol (CALCIUM/VITAMIN D3 GUMMIES) 250-350 MG-UNIT CHEW Chew 3 tablets by mouth daily.    [provider]  cholecalciferol (VITAMIN D3) 25 MCG (1000 UNIT) tablet Take by mouth once.    [provider]  Cyanocobalamin (VITAMIN B-12 CR) 1500 MCG TBCR Take 1,000  mcg by mouth daily.     [provider]  EPINEPHrine 0.3 mg/0.3 mL IJ SOAJ injection  09/11/19   [provider]  fluticasone-salmeterol (WIXELA INHUB) 100-50 MCG/ACT AEPB Inhale 1 puff into the lungs 2 (two) times daily. 05/23/22   Oretha Milch, MD  loratadine (CLARITIN) 10 MG tablet Take 10 mg by mouth daily.    [provider]  losartan-hydrochlorothiazide (HYZAAR) 100-25 MG per tablet Take 1 tablet by mouth daily.    [provider]  montelukast (SINGULAIR) 10 MG tablet TAKE ONE TABLET BY MOUTH AT BEDTIME 06/17/22   Oretha Milch, MD  Multiple Vitamin (MULTIVITAMIN WITH MINERALS) TABS tablet Take 1 tablet by mouth daily.    [provider]  omeprazole (PRILOSEC) 20 MG capsule Take 1 capsule (20 mg total) by mouth daily. 07/19/22   Oretha Milch, MD  OVER THE COUNTER MEDICATION Take 0.5 drops by mouth at bedtime. CBD Oil, 1/2 drop under the tongue nightly    [provider]  SYMBICORT 80-4.5 MCG/ACT inhaler Inhale 1 puff into the lungs in the morning and at bedtime. 05/12/22   Oretha Milch, MD     Family History  Problem Relation Age of Onset   Kidney cancer Father 81   CAD Father    Bladder Cancer Father 84       ureter   Melanoma Mother 80       STAGE 3   Breast cancer Mother 40   Heart attack Mother    Colon cancer Maternal Grandfather    Breast cancer Maternal Grandmother        meta to stomach   Breast cancer Maternal Aunt 78        bilateral   Stomach cancer Paternal Grandmother 68   Breast cancer Maternal Aunt 45   Non-Hodgkin's lymphoma Cousin    Hodgkin's lymphoma Son    Rectal cancer Neg Hx     Social History   Socioeconomic History   Marital status: Married    Spouse name: Not on file   Number of children: 2   Years of education: Not on file   Highest education level: Not on file  Occupational History   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: [employer]  Tobacco  Use   Smoking status: Never   Smokeless tobacco: Never  Vaping Use   Vaping status: Never Used  Substance and Sexual Activity   Alcohol use: Yes    Alcohol/week: 2.0 standard drinks of alcohol    Types: 1 Glasses of wine, 1 Cans of beer per week   Drug use: No   Sexual activity: Not Currently    Birth control/protection: Post-menopausal  Other Topics Concern   Not on file  Social History Narrative   Not on file   Social Determinants of Health  Financial Resource Strain: Not on file  Food Insecurity: Not on file  Transportation Needs: Not on file  Physical Activity: Not on file  Stress: Not on file  Social Connections: Not on file     Review of Systems: A 12 point ROS discussed and pertinent positives are indicated in the HPI above.  All other systems are negative.  Vital Signs: There were no vitals taken for this visit.  No physical exam was performed in lieu of virtual telephone visit.   Imaging: US Thyroid 07/19/22    Labs: None recent/pertinent.   Prior Thyroid FNA: None     Assessment and Plan: 72 year old female with an symptomatic, enlarging, benign appearing right thyroid cyst.  She would be an excellent candidate for ultrasound guided cyst aspiration and ethanol ablation.  After discussion, she is amenable to this and wishes to proceed.  Plan for ultrasound guided right thyroid cyst aspiration and ethanol ablation with local anesthesia at [facility].   Marliss Coots, MD Pager: [phone]    I spent a total of 40 minutes in virtual clinical consultation, greater than 50% of which was counseling/coordinating care for thyroid cyst treatment.

## 2022-08-01 ENCOUNTER — Ambulatory Visit
Admission: RE | Admit: 2022-08-01 | Discharge: 2022-08-01 | Disposition: A | Discharge status: Home / Self Care | Payer: Medicare Other | Source: Ambulatory Visit | Attending: Endocrinology | Admitting: Endocrinology

## 2022-08-01 DIAGNOSIS — I10 Essential (primary) hypertension: Secondary | ICD-10-CM | POA: Diagnosis not present

## 2022-08-01 DIAGNOSIS — E785 Hyperlipidemia, unspecified: Secondary | ICD-10-CM | POA: Diagnosis not present

## 2022-08-01 DIAGNOSIS — K219 Gastro-esophageal reflux disease without esophagitis: Secondary | ICD-10-CM | POA: Diagnosis not present

## 2022-08-01 DIAGNOSIS — E041 Nontoxic single thyroid nodule: Secondary | ICD-10-CM | POA: Diagnosis not present

## 2022-08-01 DIAGNOSIS — Z803 Family history of malignant neoplasm of breast: Secondary | ICD-10-CM | POA: Diagnosis not present

## 2022-08-01 DIAGNOSIS — M1990 Unspecified osteoarthritis, unspecified site: Secondary | ICD-10-CM | POA: Diagnosis not present

## 2022-08-01 HISTORY — PX: IR RADIOLOGIST EVAL & MGMT: IMG5224

## 2022-08-02 ENCOUNTER — Other Ambulatory Visit (HOSPITAL_COMMUNITY): Payer: Self-pay | Admitting: Interventional Radiology

## 2022-08-02 DIAGNOSIS — E041 Nontoxic single thyroid nodule: Secondary | ICD-10-CM

## 2022-08-03 ENCOUNTER — Telehealth: Payer: Self-pay | Admitting: Pulmonary Disease

## 2022-08-03 DIAGNOSIS — J3089 Other allergic rhinitis: Secondary | ICD-10-CM | POA: Diagnosis not present

## 2022-08-03 DIAGNOSIS — J301 Allergic rhinitis due to pollen: Secondary | ICD-10-CM | POA: Diagnosis not present

## 2022-08-03 MED ORDER — MONTELUKAST SODIUM 10 MG PO TABS: 10.0000 mg | ORAL_TABLET | Freq: Every day | ORAL | 3 refills | Status: DC

## 2022-08-03 NOTE — Telephone Encounter (Signed)
Rx sent to pharmacy   

## 2022-08-11 DIAGNOSIS — H02883 Meibomian gland dysfunction of right eye, unspecified eyelid: Secondary | ICD-10-CM | POA: Diagnosis not present

## 2022-08-11 DIAGNOSIS — H40013 Open angle with borderline findings, low risk, bilateral: Secondary | ICD-10-CM | POA: Diagnosis not present

## 2022-08-11 DIAGNOSIS — H25813 Combined forms of age-related cataract, bilateral: Secondary | ICD-10-CM | POA: Diagnosis not present

## 2022-08-11 DIAGNOSIS — H40033 Anatomical narrow angle, bilateral: Secondary | ICD-10-CM | POA: Diagnosis not present

## 2022-08-12 DIAGNOSIS — M810 Age-related osteoporosis without current pathological fracture: Secondary | ICD-10-CM | POA: Diagnosis not present

## 2022-08-15 DIAGNOSIS — J3089 Other allergic rhinitis: Secondary | ICD-10-CM | POA: Diagnosis not present

## 2022-08-15 DIAGNOSIS — J3081 Allergic rhinitis due to animal (cat) (dog) hair and dander: Secondary | ICD-10-CM | POA: Diagnosis not present

## 2022-08-15 DIAGNOSIS — J301 Allergic rhinitis due to pollen: Secondary | ICD-10-CM | POA: Diagnosis not present

## 2022-08-17 ENCOUNTER — Ambulatory Visit (HOSPITAL_BASED_OUTPATIENT_CLINIC_OR_DEPARTMENT_OTHER): Payer: Medicare Other | Admitting: Pulmonary Disease

## 2022-08-17 DIAGNOSIS — Z85820 Personal history of malignant melanoma of skin: Secondary | ICD-10-CM | POA: Diagnosis not present

## 2022-08-17 DIAGNOSIS — D2272 Melanocytic nevi of left lower limb, including hip: Secondary | ICD-10-CM | POA: Diagnosis not present

## 2022-08-17 DIAGNOSIS — L820 Inflamed seborrheic keratosis: Secondary | ICD-10-CM | POA: Diagnosis not present

## 2022-08-17 DIAGNOSIS — D485 Neoplasm of uncertain behavior of skin: Secondary | ICD-10-CM | POA: Diagnosis not present

## 2022-08-17 DIAGNOSIS — L905 Scar conditions and fibrosis of skin: Secondary | ICD-10-CM | POA: Diagnosis not present

## 2022-08-19 ENCOUNTER — Ambulatory Visit (HOSPITAL_COMMUNITY)
Admission: RE | Admit: 2022-08-19 | Discharge: 2022-08-19 | Disposition: A | Discharge status: Home / Self Care | Payer: Medicare Other | Source: Ambulatory Visit | Attending: Interventional Radiology | Admitting: Interventional Radiology

## 2022-08-19 DIAGNOSIS — E041 Nontoxic single thyroid nodule: Secondary | ICD-10-CM | POA: Insufficient documentation

## 2022-08-19 MED ORDER — LIDOCAINE HCL 1 % IJ SOLN
INTRAMUSCULAR | Status: AC
  Filled 2022-08-19: qty 20

## 2022-08-19 MED ORDER — ALCOHOL (ABLYSINOL) 99% IA SOLN
INTRA_ARTERIAL | Status: AC
  Filled 2022-08-19: qty 20

## 2022-08-23 DIAGNOSIS — J3089 Other allergic rhinitis: Secondary | ICD-10-CM | POA: Diagnosis not present

## 2022-08-23 DIAGNOSIS — J301 Allergic rhinitis due to pollen: Secondary | ICD-10-CM | POA: Diagnosis not present

## 2022-08-23 DIAGNOSIS — J3081 Allergic rhinitis due to animal (cat) (dog) hair and dander: Secondary | ICD-10-CM | POA: Diagnosis not present

## 2022-08-23 LAB — CYTOLOGY - NON PAP

## 2022-08-30 DIAGNOSIS — J301 Allergic rhinitis due to pollen: Secondary | ICD-10-CM | POA: Diagnosis not present

## 2022-08-30 DIAGNOSIS — J3089 Other allergic rhinitis: Secondary | ICD-10-CM | POA: Diagnosis not present

## 2022-09-06 DIAGNOSIS — J3089 Other allergic rhinitis: Secondary | ICD-10-CM | POA: Diagnosis not present

## 2022-09-06 DIAGNOSIS — J301 Allergic rhinitis due to pollen: Secondary | ICD-10-CM | POA: Diagnosis not present

## 2022-09-14 ENCOUNTER — Encounter: Payer: Self-pay | Admitting: Obstetrics & Gynecology

## 2022-09-16 DIAGNOSIS — J3089 Other allergic rhinitis: Secondary | ICD-10-CM | POA: Diagnosis not present

## 2022-09-16 DIAGNOSIS — J301 Allergic rhinitis due to pollen: Secondary | ICD-10-CM | POA: Diagnosis not present

## 2022-09-27 DIAGNOSIS — J301 Allergic rhinitis due to pollen: Secondary | ICD-10-CM | POA: Diagnosis not present

## 2022-09-27 DIAGNOSIS — J3081 Allergic rhinitis due to animal (cat) (dog) hair and dander: Secondary | ICD-10-CM | POA: Diagnosis not present

## 2022-09-27 DIAGNOSIS — J3089 Other allergic rhinitis: Secondary | ICD-10-CM | POA: Diagnosis not present

## 2022-09-29 DIAGNOSIS — Z1212 Encounter for screening for malignant neoplasm of rectum: Secondary | ICD-10-CM | POA: Diagnosis not present

## 2022-09-29 DIAGNOSIS — E783 Hyperchylomicronemia: Secondary | ICD-10-CM | POA: Diagnosis not present

## 2022-09-29 DIAGNOSIS — M8580 Other specified disorders of bone density and structure, unspecified site: Secondary | ICD-10-CM | POA: Diagnosis not present

## 2022-09-29 DIAGNOSIS — E538 Deficiency of other specified B group vitamins: Secondary | ICD-10-CM | POA: Diagnosis not present

## 2022-09-29 DIAGNOSIS — I10 Essential (primary) hypertension: Secondary | ICD-10-CM | POA: Diagnosis not present

## 2022-09-29 DIAGNOSIS — Z1389 Encounter for screening for other disorder: Secondary | ICD-10-CM | POA: Diagnosis not present

## 2022-10-05 DIAGNOSIS — J301 Allergic rhinitis due to pollen: Secondary | ICD-10-CM | POA: Diagnosis not present

## 2022-10-05 DIAGNOSIS — J3081 Allergic rhinitis due to animal (cat) (dog) hair and dander: Secondary | ICD-10-CM | POA: Diagnosis not present

## 2022-10-05 DIAGNOSIS — J3089 Other allergic rhinitis: Secondary | ICD-10-CM | POA: Diagnosis not present

## 2022-10-06 DIAGNOSIS — R269 Unspecified abnormalities of gait and mobility: Secondary | ICD-10-CM | POA: Diagnosis not present

## 2022-10-06 DIAGNOSIS — Z1331 Encounter for screening for depression: Secondary | ICD-10-CM | POA: Diagnosis not present

## 2022-10-06 DIAGNOSIS — Z0000 Encounter for general adult medical examination without abnormal findings: Secondary | ICD-10-CM | POA: Diagnosis not present

## 2022-10-06 DIAGNOSIS — M8580 Other specified disorders of bone density and structure, unspecified site: Secondary | ICD-10-CM | POA: Diagnosis not present

## 2022-10-06 DIAGNOSIS — E785 Hyperlipidemia, unspecified: Secondary | ICD-10-CM | POA: Diagnosis not present

## 2022-10-06 DIAGNOSIS — Z23 Encounter for immunization: Secondary | ICD-10-CM | POA: Diagnosis not present

## 2022-10-06 DIAGNOSIS — R1013 Epigastric pain: Secondary | ICD-10-CM | POA: Diagnosis not present

## 2022-10-06 DIAGNOSIS — J45909 Unspecified asthma, uncomplicated: Secondary | ICD-10-CM | POA: Diagnosis not present

## 2022-10-06 DIAGNOSIS — I10 Essential (primary) hypertension: Secondary | ICD-10-CM | POA: Diagnosis not present

## 2022-10-06 DIAGNOSIS — R82998 Other abnormal findings in urine: Secondary | ICD-10-CM | POA: Diagnosis not present

## 2022-10-06 DIAGNOSIS — C4372 Malignant melanoma of left lower limb, including hip: Secondary | ICD-10-CM | POA: Diagnosis not present

## 2022-10-06 DIAGNOSIS — M1990 Unspecified osteoarthritis, unspecified site: Secondary | ICD-10-CM | POA: Diagnosis not present

## 2022-10-06 DIAGNOSIS — G14 Postpolio syndrome: Secondary | ICD-10-CM | POA: Diagnosis not present

## 2022-10-12 DIAGNOSIS — J3089 Other allergic rhinitis: Secondary | ICD-10-CM | POA: Diagnosis not present

## 2022-10-12 DIAGNOSIS — J3081 Allergic rhinitis due to animal (cat) (dog) hair and dander: Secondary | ICD-10-CM | POA: Diagnosis not present

## 2022-10-12 DIAGNOSIS — J301 Allergic rhinitis due to pollen: Secondary | ICD-10-CM | POA: Diagnosis not present

## 2022-10-21 ENCOUNTER — Telehealth: Payer: Self-pay | Admitting: *Deleted

## 2022-10-21 DIAGNOSIS — J3089 Other allergic rhinitis: Secondary | ICD-10-CM | POA: Diagnosis not present

## 2022-10-21 DIAGNOSIS — J301 Allergic rhinitis due to pollen: Secondary | ICD-10-CM | POA: Diagnosis not present

## 2022-10-21 DIAGNOSIS — J3081 Allergic rhinitis due to animal (cat) (dog) hair and dander: Secondary | ICD-10-CM | POA: Diagnosis not present

## 2022-10-21 NOTE — Telephone Encounter (Signed)
Spoke with [name] who called to reschedule her 12/20 appt. With Dr. Pricilla Holm due to a family conflict. Pt was given an appointment on 12/26 at 4pm. Pt agreed to date and time.

## 2022-10-28 DIAGNOSIS — J301 Allergic rhinitis due to pollen: Secondary | ICD-10-CM | POA: Diagnosis not present

## 2022-10-28 DIAGNOSIS — J3089 Other allergic rhinitis: Secondary | ICD-10-CM | POA: Diagnosis not present

## 2022-10-28 DIAGNOSIS — J3081 Allergic rhinitis due to animal (cat) (dog) hair and dander: Secondary | ICD-10-CM | POA: Diagnosis not present

## 2022-11-04 DIAGNOSIS — J3081 Allergic rhinitis due to animal (cat) (dog) hair and dander: Secondary | ICD-10-CM | POA: Diagnosis not present

## 2022-11-04 DIAGNOSIS — J301 Allergic rhinitis due to pollen: Secondary | ICD-10-CM | POA: Diagnosis not present

## 2022-11-04 DIAGNOSIS — J3089 Other allergic rhinitis: Secondary | ICD-10-CM | POA: Diagnosis not present

## 2022-11-08 DIAGNOSIS — J301 Allergic rhinitis due to pollen: Secondary | ICD-10-CM | POA: Diagnosis not present

## 2022-11-08 DIAGNOSIS — J3089 Other allergic rhinitis: Secondary | ICD-10-CM | POA: Diagnosis not present

## 2022-11-15 DIAGNOSIS — J301 Allergic rhinitis due to pollen: Secondary | ICD-10-CM | POA: Diagnosis not present

## 2022-11-15 DIAGNOSIS — J3081 Allergic rhinitis due to animal (cat) (dog) hair and dander: Secondary | ICD-10-CM | POA: Diagnosis not present

## 2022-11-15 DIAGNOSIS — J3089 Other allergic rhinitis: Secondary | ICD-10-CM | POA: Diagnosis not present

## 2022-11-17 DIAGNOSIS — D2272 Melanocytic nevi of left lower limb, including hip: Secondary | ICD-10-CM | POA: Diagnosis not present

## 2022-11-17 DIAGNOSIS — Z85820 Personal history of malignant melanoma of skin: Secondary | ICD-10-CM | POA: Diagnosis not present

## 2022-11-17 DIAGNOSIS — L821 Other seborrheic keratosis: Secondary | ICD-10-CM | POA: Diagnosis not present

## 2022-11-17 DIAGNOSIS — D225 Melanocytic nevi of trunk: Secondary | ICD-10-CM | POA: Diagnosis not present

## 2022-11-17 DIAGNOSIS — D485 Neoplasm of uncertain behavior of skin: Secondary | ICD-10-CM | POA: Diagnosis not present

## 2022-11-17 DIAGNOSIS — L814 Other melanin hyperpigmentation: Secondary | ICD-10-CM | POA: Diagnosis not present

## 2022-11-25 DIAGNOSIS — J3089 Other allergic rhinitis: Secondary | ICD-10-CM | POA: Diagnosis not present

## 2022-11-25 DIAGNOSIS — J301 Allergic rhinitis due to pollen: Secondary | ICD-10-CM | POA: Diagnosis not present

## 2022-11-25 DIAGNOSIS — J3081 Allergic rhinitis due to animal (cat) (dog) hair and dander: Secondary | ICD-10-CM | POA: Diagnosis not present

## 2022-12-09 DIAGNOSIS — J3081 Allergic rhinitis due to animal (cat) (dog) hair and dander: Secondary | ICD-10-CM | POA: Diagnosis not present

## 2022-12-09 DIAGNOSIS — J3089 Other allergic rhinitis: Secondary | ICD-10-CM | POA: Diagnosis not present

## 2022-12-09 DIAGNOSIS — J301 Allergic rhinitis due to pollen: Secondary | ICD-10-CM | POA: Diagnosis not present

## 2022-12-15 DIAGNOSIS — J301 Allergic rhinitis due to pollen: Secondary | ICD-10-CM | POA: Diagnosis not present

## 2022-12-15 DIAGNOSIS — J3089 Other allergic rhinitis: Secondary | ICD-10-CM | POA: Diagnosis not present

## 2022-12-15 DIAGNOSIS — J3081 Allergic rhinitis due to animal (cat) (dog) hair and dander: Secondary | ICD-10-CM | POA: Diagnosis not present

## 2022-12-19 ENCOUNTER — Ambulatory Visit: Payer: Self-pay | Admitting: Radiation Oncology

## 2022-12-21 ENCOUNTER — Other Ambulatory Visit: Payer: Self-pay | Admitting: Gynecologic Oncology

## 2022-12-21 DIAGNOSIS — R110 Nausea: Secondary | ICD-10-CM

## 2022-12-21 DIAGNOSIS — C541 Malignant neoplasm of endometrium: Secondary | ICD-10-CM

## 2022-12-21 DIAGNOSIS — G8929 Other chronic pain: Secondary | ICD-10-CM

## 2022-12-21 MED ORDER — HYDROMORPHONE HCL 2 MG PO TABS: 2.0000 mg | ORAL_TABLET | Freq: Once | ORAL | 0 refills | Status: AC

## 2022-12-21 MED ORDER — ONDANSETRON HCL 4 MG PO TABS: 4.0000 mg | ORAL_TABLET | Freq: Once | ORAL | 0 refills | Status: AC

## 2022-12-21 NOTE — Progress Notes (Signed)
See CMA note. Patient would like to have oral meds prior to pelvic examination. She will have a driver. Meds sent in per Dr. Winferd Humphrey last note.

## 2022-12-23 ENCOUNTER — Ambulatory Visit: Payer: Medicare Other | Admitting: Gynecologic Oncology

## 2022-12-23 DIAGNOSIS — J3081 Allergic rhinitis due to animal (cat) (dog) hair and dander: Secondary | ICD-10-CM | POA: Diagnosis not present

## 2022-12-23 DIAGNOSIS — J301 Allergic rhinitis due to pollen: Secondary | ICD-10-CM | POA: Diagnosis not present

## 2022-12-23 DIAGNOSIS — J3089 Other allergic rhinitis: Secondary | ICD-10-CM | POA: Diagnosis not present

## 2022-12-29 ENCOUNTER — Inpatient Hospital Stay: Payer: Medicare Other | Attending: Gynecologic Oncology | Admitting: Gynecologic Oncology

## 2022-12-29 ENCOUNTER — Encounter: Payer: Self-pay | Admitting: Gynecologic Oncology

## 2022-12-29 VITALS — BP 118/93 | HR 83 | Temp 98.1°F | Resp 18 | Ht 62.99 in | Wt 148.0 lb

## 2022-12-29 DIAGNOSIS — Z08 Encounter for follow-up examination after completed treatment for malignant neoplasm: Secondary | ICD-10-CM | POA: Insufficient documentation

## 2022-12-29 DIAGNOSIS — Z9079 Acquired absence of other genital organ(s): Secondary | ICD-10-CM | POA: Insufficient documentation

## 2022-12-29 DIAGNOSIS — C541 Malignant neoplasm of endometrium: Secondary | ICD-10-CM

## 2022-12-29 DIAGNOSIS — Z923 Personal history of irradiation: Secondary | ICD-10-CM | POA: Diagnosis not present

## 2022-12-29 DIAGNOSIS — Z90710 Acquired absence of both cervix and uterus: Secondary | ICD-10-CM | POA: Diagnosis not present

## 2022-12-29 DIAGNOSIS — Z90722 Acquired absence of ovaries, bilateral: Secondary | ICD-10-CM | POA: Insufficient documentation

## 2022-12-29 DIAGNOSIS — Z8542 Personal history of malignant neoplasm of other parts of uterus: Secondary | ICD-10-CM | POA: Diagnosis not present

## 2022-12-29 NOTE — Progress Notes (Signed)
Gynecologic Oncology Return Clinic Visit  12/29/22  Reason for Visit: Surveillance visit in the setting of high intermediate risk uterine cancer   Treatment History: Oncology History Overview Note  MSI-stable   Endometrial cancer (HCC)  02/27/2019 Initial Biopsy   Gr1 EMC on EMB   02/27/2019 Initial Diagnosis   Endometrial cancer (HCC)   03/05/2019 Imaging   Pelvic ultrasound: Uterus measures 7.9 x 5.7 x 3.7 cm with an endometrial lining of 2.9 cm.  Bilateral ovaries normal in size and shape.   03/20/2019 Surgery   Lsc LOA, TRH/BSO, SLN on R, left pelvic LND   03/20/2019 Cancer Staging   Staging form: Corpus Uteri - Carcinoma and Carcinosarcoma, AJCC 8th Edition - Clinical stage from 03/20/2019: FIGO Stage IB (cT1b, cN0, cM0) - Signed by Carver Fila, MD on 03/27/2019   05/06/2019 - 06/04/2019 Radiation Therapy   Site Technique Total Dose (Gy) Dose per Fx (Gy) Completed Fx Beam Energies  Vagina: Pelvis HDR-brachy 30/30 6 5/5 Ir-192     03/09/2020 Genetic Testing   Negative genetic testing on the CancerNext-Expanded+RNAinsight panel.  The CancerNext-Expanded gene panel offered by [facility] and includes sequencing and rearrangement analysis for the following 77 genes: AIP, ALK, APC*, ATM*, AXIN2, BAP1, BARD1, BLM, BMPR1A, BRCA1*, BRCA2*, BRIP1*, CDC73, CDH1*, CDK4, CDKN1B, CDKN2A, CHEK2*, CTNNA1, DICER1, FANCC, FH, FLCN, GALNT12, KIF1B, LZTR1, MAX, MEN1, MET, MLH1*, MSH2*, MSH3, MSH6*, MUTYH*, NBN, NF1*, NF2, NTHL1, PALB2*, PHOX2B, PMS2*, POT1, PRKAR1A, PTCH1, PTEN*, RAD51C*, RAD51D*, RB1, RECQL, RET, SDHA, SDHAF2, SDHB, SDHC, SDHD, SMAD4, SMARCA4, SMARCB1, SMARCE1, STK11, SUFU, TMEM127, TP53*, TSC1, TSC2, VHL and XRCC2 (sequencing and deletion/duplication); EGFR, EGLN1, HOXB13, KIT, MITF, PDGFRA, POLD1, and POLE (sequencing only); EPCAM and GREM1 (deletion/duplication only). DNA and RNA analyses performed for * genes. The report date was March 09, 2020.     Interval History: Doing  well.  Using vaginal dilator about once every 10 days.  Denies any vaginal bleeding.  Uses a stool softener about once a week, denies any change to bowel function.  Denies any urinary symptoms.  Denies abdominal or pelvic pain.  Her husband had his 6 stroke just about 2 weeks ago.  Currently he is in rehab.  Past Medical/Surgical History: Past Medical History:  Diagnosis Date   Allergic rhinitis    Arthritis    Asthma    Carpal tunnel syndrome on right    Endometrial cancer (HCC)    Family history of breast cancer    Family history of colon cancer    Family history of kidney cancer    Family history of stomach cancer    GERD (gastroesophageal reflux disease)    History of radiation therapy 06/04/2019   vaginal brachytherapy 05/06/2019-06/04/2019   Dr Antony Blackbird   Hyperlipemia    PMB (postmenopausal bleeding)    Polio    Left leg   Unspecified essential hypertension    clearance Dr Everardo Beals with note on chart    Past Surgical History:  Procedure Laterality Date   BREAST CYST ASPIRATION Right 2004   BREAST EXCISIONAL BIOPSY Bilateral    BREAST SURGERY     bilateral fibroid tumors removed   CARPAL TUNNEL RELEASE     right   CESAREAN SECTION     x 2   COLON SURGERY     scar tissue removed   COLONOSCOPY     IR RADIOLOGIST EVAL & MGMT  08/01/2022   leg surgery     bilateral, numerous   LYMPH NODE DISSECTION N/A 03/20/2019  Procedure: LYMPH NODE DISSECTION;  Surgeon: Carver Fila, MD;  Location: [facility];  Service: Gynecology;  Laterality: N/A;   ROBOTIC ASSISTED TOTAL HYSTERECTOMY WITH BILATERAL SALPINGO OOPHERECTOMY N/A 03/20/2019   Procedure: XI ROBOTIC ASSISTED TOTAL HYSTERECTOMY WITH BILATERAL SALPINGO OOPHORECTOMY;  Surgeon: Carver Fila, MD;  Location: [facility];  Service: Gynecology;  Laterality: N/A;   SENTINEL NODE BIOPSY  03/20/2019   Procedure: SENTINEL NODE BIOPSY;  Surgeon: Carver Fila, MD;  Location: [facility];  Service: Gynecology;;   tooth implant     titanium/ front upper right   TOTAL HIP ARTHROPLASTY     right   TOTAL HIP REVISION  08/15/2011   Procedure: TOTAL HIP REVISION;  Surgeon: Shelda Pal, MD;  Location: [facility];  Service: Orthopedics;  Laterality: Right;   vocal surgery     cyst removed   WRIST SURGERY  2009   left    Family History  Problem Relation Age of Onset   Kidney cancer Father 19   CAD Father    Bladder Cancer Father 22       ureter   Melanoma Mother 4       STAGE 3   Breast cancer Mother 65   Heart attack Mother    Colon cancer Maternal Grandfather    Breast cancer Maternal Grandmother        meta to stomach   Breast cancer Maternal Aunt 78        bilateral   Stomach cancer Paternal Grandmother 67   Breast cancer Maternal Aunt 45   Non-Hodgkin's lymphoma Cousin    Hodgkin's lymphoma Son    Rectal cancer Neg Hx     Social History   Socioeconomic History   Marital status: Married    Spouse name: Not on file   Number of children: 2   Years of education: Not on file   Highest education level: Not on file  Occupational History   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: [employer]  Tobacco Use   Smoking status: Never   Smokeless tobacco: Never  Vaping Use   Vaping status: Never Used  Substance and Sexual Activity   Alcohol use: Yes    Alcohol/week: 2.0 standard drinks of alcohol    Types: 1 Glasses of wine, 1 Cans of beer per week   Drug use: No   Sexual activity: Not Currently    Birth control/protection: Post-menopausal  Other Topics Concern   Not on file  Social History Narrative   Not on file   Social Drivers of Health   Financial Resource Strain: Not on file  Food Insecurity: Not on file  Transportation Needs: Not on file  Physical Activity: Not on file  Stress: Not on file  Social Connections: Not on file    Current Medications:  Current Outpatient Medications:     albuterol (PROVENTIL) (2.5 MG/3ML) 0.083% nebulizer solution, Take 3 mLs (2.5 mg total) by nebulization every 6 (six) hours as needed for wheezing or shortness of breath., Disp: 75 vial, Rfl: 5   amLODipine (NORVASC) 2.5 MG tablet, Take 2.5 mg by mouth daily., Disp: , Rfl:    Ca Phosphate-Cholecalciferol (CALCIUM/VITAMIN D3 GUMMIES) 250-350 MG-UNIT CHEW, Chew 3 tablets by mouth daily., Disp: , Rfl:    cholecalciferol (VITAMIN D3) 25 MCG (1000 UNIT) tablet, Take by mouth once., Disp: , Rfl:    Cyanocobalamin (VITAMIN B-12 CR) 1500 MCG  TBCR, Take 1,000 mcg by mouth daily. , Disp: , Rfl:    EPINEPHrine 0.3 mg/0.3 mL IJ SOAJ injection, , Disp: , Rfl:    fluticasone-salmeterol (WIXELA INHUB) 100-50 MCG/ACT AEPB, Inhale 1 puff into the lungs 2 (two) times daily., Disp: 60 each, Rfl: 1   loratadine (CLARITIN) 10 MG tablet, Take 10 mg by mouth daily., Disp: , Rfl:    losartan-hydrochlorothiazide (HYZAAR) 100-25 MG per tablet, Take 1 tablet by mouth daily., Disp: , Rfl:    montelukast (SINGULAIR) 10 MG tablet, Take 1 tablet (10 mg total) by mouth at bedtime., Disp: 30 tablet, Rfl: 3   Multiple Vitamin (MULTIVITAMIN WITH MINERALS) TABS tablet, Take 1 tablet by mouth daily., Disp: , Rfl:    omeprazole (PRILOSEC) 20 MG capsule, Take 1 capsule (20 mg total) by mouth daily., Disp: 30 capsule, Rfl: 5   OVER THE COUNTER MEDICATION, Take 0.5 drops by mouth at bedtime. CBD Oil, 1/2 drop under the tongue nightly, Disp: , Rfl:    SYMBICORT 80-4.5 MCG/ACT inhaler, Inhale 1 puff into the lungs in the morning and at bedtime., Disp: 10.2 g, Rfl: 4   HYDROmorphone (DILAUDID) 2 MG tablet, Take 1 tablet (2 mg total) by mouth once for 1 dose. Take 30 minutes before appointment at the [facility]. Do not take and drive, Disp: 1 tablet, Rfl: 0   ondansetron (ZOFRAN) 4 MG tablet, Take 1 tablet (4 mg total) by mouth once for 1 dose. Take 30 minutes before your appointment at the [facility]. You must have a driver to and  from., Disp: 1 tablet, Rfl: 0  Review of Systems: + tinnitus Denies appetite changes, fevers, chills, fatigue, unexplained weight changes. Denies hearing loss, neck lumps or masses, mouth sores or voice changes. Denies cough or wheezing.  Denies shortness of breath. Denies chest pain or palpitations. Denies leg swelling. Denies abdominal distention, pain, blood in stools, constipation, diarrhea, nausea, vomiting, or early satiety. Denies pain with intercourse, dysuria, frequency, hematuria or incontinence. Denies hot flashes, pelvic pain, vaginal bleeding or vaginal discharge.   Denies joint pain, back pain or muscle pain/cramps. Denies itching, rash, or wounds. Denies dizziness, headaches, numbness or seizures. Denies swollen lymph nodes or glands, denies easy bruising or bleeding. Denies anxiety, depression, confusion, or decreased concentration.  Physical Exam: BP (!) 118/93 (BP Location: Left Arm, Patient Position: Sitting, Cuff Size: Normal)   Pulse 83   Temp 98.1 F (36.7 C) (Oral)   Resp 18   Ht 5' 2.99" (1.6 m)   Wt 148 lb (67.1 kg) Comment: per patient  SpO2 98%   BMI 26.23 kg/m  General: Alert, oriented, no acute distress. HEENT: Normocephalic, atraumatic, sclera anicteric. Chest: Clear to auscultation bilaterally.  No inspiratory or expiratory wheezing appreciated. Cardiovascular: Regular rate and rhythm, no murmurs. Abdomen: soft, nontender.  Normoactive bowel sounds.  No masses or hepatosplenomegaly appreciated.  Well-healed incisions. Extremities: Grossly normal range of motion although somewhat decreased mobility of her left lower extremity, at baseline.  Warm, well perfused.  No edema bilaterally. Skin: No rashes or lesions noted. Lymphatics: No cervical, supraclavicular, or inguinal adenopathy. GU: Normal appearing external genitalia without erythema, excoriation, or lesions.  Speculum exam reveals moderately atrophic vaginal mucosa consistent with radiation  changes, no lesions or masses.  Small speculum used.  Bimanual exam reveals no nodularity.  Rectovaginal exam confirms his exam findings.   Laboratory & Radiologic Studies: None new  Assessment & Plan: [name] is a 72 y.o. woman with Stage IB  grade 1 endometrioid adenocarcinoma the endometrium who presents for surveillance visit. Finished adjuvant VBT in 06/2019. MS-stable. Negative genetic testing.   Patient is doing well and is NED on exam today.  There is no evidence of agglutination on exam.  We will plan either for oral Dilaudid with an anti-emetic to be taken before she gets here or vaginal Valium.  .  Patient continues to tolerate the exam very well with premedication; could consider vaginal Valium in the future for exams.   Per NCCN surveillance recommendations, we will continue with visits every 6 months.  She prefers to have this with me only.  I will see her back in June.  We discussed signs and symptoms that would be concerning for disease recurrence, and the patient knows to call if she develops any of these sooner than her next scheduled visit.    22 minutes of total time was spent for this patient encounter, including preparation, face-to-face counseling with the patient and coordination of care, and documentation of the encounter.  Eugene Garnet, MD  Division of Gynecologic Oncology  Department of Obstetrics and Gynecology  [facility]

## 2022-12-29 NOTE — Patient Instructions (Signed)
It was good to see you today.  I do not see or feel any evidence of cancer recurrence on your exam.  We will see you for follow-up in 6 months.  As always, if you develop any new and concerning symptoms before your next visit, please call to see me sooner.

## 2023-01-05 DIAGNOSIS — J3081 Allergic rhinitis due to animal (cat) (dog) hair and dander: Secondary | ICD-10-CM | POA: Diagnosis not present

## 2023-01-05 DIAGNOSIS — J301 Allergic rhinitis due to pollen: Secondary | ICD-10-CM | POA: Diagnosis not present

## 2023-01-05 DIAGNOSIS — J3089 Other allergic rhinitis: Secondary | ICD-10-CM | POA: Diagnosis not present

## 2023-01-12 DIAGNOSIS — J3089 Other allergic rhinitis: Secondary | ICD-10-CM | POA: Diagnosis not present

## 2023-01-12 DIAGNOSIS — J301 Allergic rhinitis due to pollen: Secondary | ICD-10-CM | POA: Diagnosis not present

## 2023-01-12 DIAGNOSIS — J209 Acute bronchitis, unspecified: Secondary | ICD-10-CM | POA: Diagnosis not present

## 2023-01-12 DIAGNOSIS — J4530 Mild persistent asthma, uncomplicated: Secondary | ICD-10-CM | POA: Diagnosis not present

## 2023-01-13 ENCOUNTER — Other Ambulatory Visit: Payer: Self-pay | Admitting: Pulmonary Disease

## 2023-01-18 ENCOUNTER — Other Ambulatory Visit: Payer: Self-pay | Admitting: Interventional Radiology

## 2023-01-18 DIAGNOSIS — E041 Nontoxic single thyroid nodule: Secondary | ICD-10-CM

## 2023-01-19 ENCOUNTER — Other Ambulatory Visit: Payer: Self-pay | Admitting: Pulmonary Disease

## 2023-01-23 ENCOUNTER — Telehealth: Payer: Self-pay | Admitting: Pulmonary Disease

## 2023-01-23 MED ORDER — OMEPRAZOLE 20 MG PO CPDR: 20.0000 mg | DELAYED_RELEASE_CAPSULE | Freq: Every day | ORAL | 5 refills | Status: DC

## 2023-01-23 NOTE — Telephone Encounter (Signed)
Patient states needs refill for Omeprazole. Patient scheduled 04/03/2023 with Dr. Vassie Loll. Pharmacy [facility]. Patient states pharmacy will deliver today at 11:00 am to her house.  Patient phone number is [phone].

## 2023-01-23 NOTE — Telephone Encounter (Signed)
Refill sent.

## 2023-01-25 ENCOUNTER — Ambulatory Visit
Admission: RE | Admit: 2023-01-25 | Discharge: 2023-01-25 | Disposition: A | Discharge status: Home / Self Care | Payer: Medicare Other | Source: Ambulatory Visit | Attending: Interventional Radiology | Admitting: Interventional Radiology

## 2023-01-25 DIAGNOSIS — E041 Nontoxic single thyroid nodule: Secondary | ICD-10-CM

## 2023-01-25 DIAGNOSIS — J3089 Other allergic rhinitis: Secondary | ICD-10-CM | POA: Diagnosis not present

## 2023-01-25 DIAGNOSIS — J3081 Allergic rhinitis due to animal (cat) (dog) hair and dander: Secondary | ICD-10-CM | POA: Diagnosis not present

## 2023-01-25 DIAGNOSIS — J301 Allergic rhinitis due to pollen: Secondary | ICD-10-CM | POA: Diagnosis not present

## 2023-01-27 ENCOUNTER — Other Ambulatory Visit: Payer: Self-pay | Admitting: Internal Medicine

## 2023-01-27 DIAGNOSIS — Z1231 Encounter for screening mammogram for malignant neoplasm of breast: Secondary | ICD-10-CM

## 2023-01-27 NOTE — Progress Notes (Signed)
Referring Physician(s): Balan, Bindubal   Chief Complaint: The patient is seen in virtual telephone follow up today s/p right thyroid cyst aspiration with ethanol ablation 08/19/22  History of present illness: HPI from initial consultation 08/01/22 [name] is a 73 y.o. female with a medical history significant for HTN, polio, endometrial cancer s/p robotic hysterectomy with bilateral salpingo oophorectomy and VBT and melanoma s/p excision earlier this year. She also has a history of a right mid thyroid cyst which was first identified in 2023. A recent repeat ultrasound shows the cyst has enlarged and the patient is interested in pursuing aspiration with possible ablation. She has been referred to Interventional Radiology by her primary care physician Dr. Talmage Nap and she presents today via virtual telephone visit for further discussion.    Thyroid ultrasounds in June 2023 and July 2024 have demonstrated an enlarging simple right cyst, now measuring up to 3.8 cm. 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 She also endorses a pressure sensation when swallowing. This has gradually onset over years.  She has never had hypo- or hyper-thyroid symptoms or laboratory evidence to prove anything but euthyroid.    She was found to be an excellent candidate for an ultrasound guided cyst aspiration with ethanol ablation. We discussed the procedure details, risks, benefits and alternatives and she expressed a desire to proceed. This was performed 08/19/22 and she tolerated the procedure well. A fluid sample was sent for pathology with benign findings.   She had follow up imaging 01/24/23 and she presents today for follow up via virtual tele-health visit.  Past Medical History:  Diagnosis Date   Allergic rhinitis    Arthritis    Asthma    Carpal tunnel syndrome on right    Endometrial cancer (HCC)    Family history of breast cancer    Family history  of colon cancer    Family history of kidney cancer    Family history of stomach cancer    GERD (gastroesophageal reflux disease)    History of radiation therapy 06/04/2019   vaginal brachytherapy 05/06/2019-06/04/2019   Dr Antony Blackbird   Hyperlipemia    PMB (postmenopausal bleeding)    Polio    Left leg   Unspecified essential hypertension    clearance Dr Everardo Beals with note on chart    Past Surgical History:  Procedure Laterality Date   BREAST CYST ASPIRATION Right 2004   BREAST EXCISIONAL BIOPSY Bilateral    BREAST SURGERY     bilateral fibroid tumors removed   CARPAL TUNNEL RELEASE     right   CESAREAN SECTION     x 2   COLON SURGERY     scar tissue removed   COLONOSCOPY     IR RADIOLOGIST EVAL & MGMT  08/01/2022   leg surgery     bilateral, numerous   LYMPH NODE DISSECTION N/A 03/20/2019   Procedure: LYMPH NODE DISSECTION;  Surgeon: Carver Fila, MD;  Location: [facility];  Service: Gynecology;  Laterality: N/A;   ROBOTIC ASSISTED TOTAL HYSTERECTOMY WITH BILATERAL SALPINGO OOPHERECTOMY N/A 03/20/2019   Procedure: XI ROBOTIC ASSISTED TOTAL HYSTERECTOMY WITH BILATERAL SALPINGO OOPHORECTOMY;  Surgeon: Carver Fila, MD;  [facility];  Service: Gynecology;  Laterality: N/A;   SENTINEL NODE BIOPSY  03/20/2019   Procedure: SENTINEL NODE BIOPSY;  Surgeon: Carver Fila, MD;  Location: [facility];  Service: Gynecology;;  tooth implant     titanium/ front upper right   TOTAL HIP ARTHROPLASTY     right   TOTAL HIP REVISION  08/15/2011   Procedure: TOTAL HIP REVISION;  Surgeon: Shelda Pal, MD;  Location: [facility];  Service: Orthopedics;  Laterality: Right;   vocal surgery     cyst removed   WRIST SURGERY  2009   left    Allergies: Molds & smuts and Latex  Medications: Prior to Admission medications   Medication Sig Start Date End Date Taking? Authorizing Provider  albuterol (PROVENTIL) (2.5 MG/3ML)  0.083% nebulizer solution Take 3 mLs (2.5 mg total) by nebulization every 6 (six) hours as needed for wheezing or shortness of breath. 02/26/18   Parrett, Virgel Bouquet, NP  amLODipine (NORVASC) 2.5 MG tablet Take 2.5 mg by mouth daily.    [provider]  Ca Phosphate-Cholecalciferol (CALCIUM/VITAMIN D3 GUMMIES) 250-350 MG-UNIT CHEW Chew 3 tablets by mouth daily.    [provider]  cholecalciferol (VITAMIN D3) 25 MCG (1000 UNIT) tablet Take by mouth once.    [provider]  Cyanocobalamin (VITAMIN B-12 CR) 1500 MCG TBCR Take 1,000 mcg by mouth daily.     [provider]  EPINEPHrine 0.3 mg/0.3 mL IJ SOAJ injection  09/11/19   [provider]  fluticasone-salmeterol (WIXELA INHUB) 100-50 MCG/ACT AEPB Inhale 1 puff into the lungs 2 (two) times daily. 05/23/22   Oretha Milch, MD  loratadine (CLARITIN) 10 MG tablet Take 10 mg by mouth daily.    [provider]  losartan-hydrochlorothiazide (HYZAAR) 100-25 MG per tablet Take 1 tablet by mouth daily.    [provider]  montelukast (SINGULAIR) 10 MG tablet Take 1 tablet (10 mg total) by mouth at bedtime. 08/03/22   Oretha Milch, MD  Multiple Vitamin (MULTIVITAMIN WITH MINERALS) TABS tablet Take 1 tablet by mouth daily.    [provider]  omeprazole (PRILOSEC) 20 MG capsule Take 1 capsule (20 mg total) by mouth daily. 01/23/23   Oretha Milch, MD  OVER THE COUNTER MEDICATION Take 0.5 drops by mouth at bedtime. CBD Oil, 1/2 drop under the tongue nightly    [provider]  SYMBICORT 80-4.5 MCG/ACT inhaler Inhale 1 puff into the lungs in the morning and at bedtime. 05/12/22   Oretha Milch, MD     Family History  Problem Relation Age of Onset   Kidney cancer Father 69   CAD Father    Bladder Cancer Father 17       ureter   Melanoma Mother 81       STAGE 3   Breast cancer Mother 71   Heart attack Mother    Colon cancer Maternal Grandfather    Breast cancer Maternal  Grandmother        meta to stomach   Breast cancer Maternal Aunt 78        bilateral   Stomach cancer Paternal Grandmother 59   Breast cancer Maternal Aunt 45   Non-Hodgkin's lymphoma Cousin    Hodgkin's lymphoma Son    Rectal cancer Neg Hx     Social History   Socioeconomic History   Marital status: Married    Spouse name: Not on file   Number of children: 2   Years of education: Not on file   Highest education level: Not on file  Occupational History   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: [employer]  Tobacco  Use   Smoking status: Never   Smokeless tobacco: Never  Vaping Use   Vaping status: Never Used  Substance and Sexual Activity   Alcohol use: Yes    Alcohol/week: 2.0 standard drinks of alcohol    Types: 1 Glasses of wine, 1 Cans of beer per week   Drug use: No   Sexual activity: Not Currently    Birth control/protection: Post-menopausal  Other Topics Concern   Not on file  Social History Narrative   Not on file   Social Drivers of Health   Financial Resource Strain: Not on file  Food Insecurity: Not on file  Transportation Needs: Not on file  Physical Activity: Not on file  Stress: Not on file  Social Connections: Not on file     Vital Signs: There were no vitals taken for this visit.  No physical exam was performed in lieu of virtual telephone visit.   Imaging: US Thyroid 07/19/22    US thyroid 01/25/23   Labs:  CBC: No results for input(s): "WBC", "HGB", "HCT", "PLT" in the last 8760 hours.  COAGS: No results for input(s): "INR", "APTT" in the last 8760 hours.  BMP: No results for input(s): "NA", "K", "CL", "CO2", "GLUCOSE", "BUN", "CALCIUM", "CREATININE", "GFRNONAA", "GFRAA" in the last 8760 hours.  Invalid input(s): "CMP"  LIVER FUNCTION TESTS: No results for input(s): "BILITOT", "AST", "ALT", "ALKPHOS", "PROT", "ALBUMIN" in the last 8760 hours.  Assessment and Plan: 73 year old female  with a symptomatic, enlarging, benign appearing right thyroid cyst now s/p aspiration and ethanol ablation 08/19/22.   Electronically Signed: Mickie Kay 01/27/2023, 2:52 PM   I spent a total of 25 Minutes in virtual telephone clinical consultation, greater than 50% of which was counseling/coordinating care for symptomatic thyroid cyst.

## 2023-01-30 ENCOUNTER — Ambulatory Visit
Admission: RE | Admit: 2023-01-30 | Discharge: 2023-01-30 | Disposition: A | Discharge status: Home / Self Care | Payer: Medicare Other | Source: Ambulatory Visit | Attending: Interventional Radiology | Admitting: Interventional Radiology

## 2023-01-30 DIAGNOSIS — E041 Nontoxic single thyroid nodule: Secondary | ICD-10-CM

## 2023-01-30 HISTORY — PX: IR RADIOLOGIST EVAL & MGMT: IMG5224

## 2023-02-08 DIAGNOSIS — J3089 Other allergic rhinitis: Secondary | ICD-10-CM | POA: Diagnosis not present

## 2023-02-08 DIAGNOSIS — J301 Allergic rhinitis due to pollen: Secondary | ICD-10-CM | POA: Diagnosis not present

## 2023-02-08 DIAGNOSIS — J3081 Allergic rhinitis due to animal (cat) (dog) hair and dander: Secondary | ICD-10-CM | POA: Diagnosis not present

## 2023-02-15 DIAGNOSIS — J3089 Other allergic rhinitis: Secondary | ICD-10-CM | POA: Diagnosis not present

## 2023-02-15 DIAGNOSIS — J3081 Allergic rhinitis due to animal (cat) (dog) hair and dander: Secondary | ICD-10-CM | POA: Diagnosis not present

## 2023-02-15 DIAGNOSIS — J301 Allergic rhinitis due to pollen: Secondary | ICD-10-CM | POA: Diagnosis not present

## 2023-02-17 ENCOUNTER — Other Ambulatory Visit: Payer: Self-pay | Admitting: Pulmonary Disease

## 2023-02-17 ENCOUNTER — Ambulatory Visit: Payer: Medicare Other

## 2023-02-20 ENCOUNTER — Encounter (HOSPITAL_BASED_OUTPATIENT_CLINIC_OR_DEPARTMENT_OTHER): Payer: Self-pay

## 2023-02-20 ENCOUNTER — Telehealth: Payer: Self-pay | Admitting: Pulmonary Disease

## 2023-02-20 ENCOUNTER — Other Ambulatory Visit: Payer: Self-pay | Admitting: Pulmonary Disease

## 2023-02-20 NOTE — Telephone Encounter (Signed)
Fax came to [facility] asking for a refill to be sent for Montelukast Sod 10 MG tablet(Singulair substitute

## 2023-02-20 NOTE — Telephone Encounter (Signed)
Patient states needs refill for Omeprazole. Pharmacy is [facility]. Patient scheduled 04/03/2023 with Dr. Vassie Loll. Patient phone number is [phone].

## 2023-02-21 DIAGNOSIS — J301 Allergic rhinitis due to pollen: Secondary | ICD-10-CM | POA: Diagnosis not present

## 2023-02-21 DIAGNOSIS — J3081 Allergic rhinitis due to animal (cat) (dog) hair and dander: Secondary | ICD-10-CM | POA: Diagnosis not present

## 2023-02-21 DIAGNOSIS — J3089 Other allergic rhinitis: Secondary | ICD-10-CM | POA: Diagnosis not present

## 2023-02-22 MED ORDER — MONTELUKAST SODIUM 10 MG PO TABS: 10.0000 mg | ORAL_TABLET | Freq: Every day | ORAL | 3 refills | Status: DC

## 2023-02-24 ENCOUNTER — Ambulatory Visit
Admission: RE | Admit: 2023-02-24 | Discharge: 2023-02-24 | Disposition: A | Discharge status: Home / Self Care | Payer: Medicare Other | Source: Ambulatory Visit | Attending: Internal Medicine | Admitting: Internal Medicine

## 2023-02-24 DIAGNOSIS — Z1231 Encounter for screening mammogram for malignant neoplasm of breast: Secondary | ICD-10-CM | POA: Diagnosis not present

## 2023-02-28 DIAGNOSIS — J3081 Allergic rhinitis due to animal (cat) (dog) hair and dander: Secondary | ICD-10-CM | POA: Diagnosis not present

## 2023-02-28 DIAGNOSIS — J301 Allergic rhinitis due to pollen: Secondary | ICD-10-CM | POA: Diagnosis not present

## 2023-02-28 DIAGNOSIS — J3089 Other allergic rhinitis: Secondary | ICD-10-CM | POA: Diagnosis not present

## 2023-03-09 DIAGNOSIS — J301 Allergic rhinitis due to pollen: Secondary | ICD-10-CM | POA: Diagnosis not present

## 2023-03-09 DIAGNOSIS — J3081 Allergic rhinitis due to animal (cat) (dog) hair and dander: Secondary | ICD-10-CM | POA: Diagnosis not present

## 2023-03-09 DIAGNOSIS — J3089 Other allergic rhinitis: Secondary | ICD-10-CM | POA: Diagnosis not present

## 2023-03-14 DIAGNOSIS — J4530 Mild persistent asthma, uncomplicated: Secondary | ICD-10-CM | POA: Diagnosis not present

## 2023-03-14 DIAGNOSIS — K219 Gastro-esophageal reflux disease without esophagitis: Secondary | ICD-10-CM | POA: Diagnosis not present

## 2023-03-14 DIAGNOSIS — J3081 Allergic rhinitis due to animal (cat) (dog) hair and dander: Secondary | ICD-10-CM | POA: Diagnosis not present

## 2023-03-14 DIAGNOSIS — J3089 Other allergic rhinitis: Secondary | ICD-10-CM | POA: Diagnosis not present

## 2023-03-14 DIAGNOSIS — J301 Allergic rhinitis due to pollen: Secondary | ICD-10-CM | POA: Diagnosis not present

## 2023-03-23 DIAGNOSIS — J3089 Other allergic rhinitis: Secondary | ICD-10-CM | POA: Diagnosis not present

## 2023-03-23 DIAGNOSIS — J301 Allergic rhinitis due to pollen: Secondary | ICD-10-CM | POA: Diagnosis not present

## 2023-03-23 DIAGNOSIS — J3081 Allergic rhinitis due to animal (cat) (dog) hair and dander: Secondary | ICD-10-CM | POA: Diagnosis not present

## 2023-03-31 DIAGNOSIS — J3081 Allergic rhinitis due to animal (cat) (dog) hair and dander: Secondary | ICD-10-CM | POA: Diagnosis not present

## 2023-03-31 DIAGNOSIS — J3089 Other allergic rhinitis: Secondary | ICD-10-CM | POA: Diagnosis not present

## 2023-03-31 DIAGNOSIS — J301 Allergic rhinitis due to pollen: Secondary | ICD-10-CM | POA: Diagnosis not present

## 2023-04-03 ENCOUNTER — Ambulatory Visit (HOSPITAL_BASED_OUTPATIENT_CLINIC_OR_DEPARTMENT_OTHER): Payer: Medicare Other | Admitting: Pulmonary Disease

## 2023-04-03 ENCOUNTER — Encounter (HOSPITAL_BASED_OUTPATIENT_CLINIC_OR_DEPARTMENT_OTHER): Payer: Self-pay | Admitting: Pulmonary Disease

## 2023-04-03 VITALS — BP 118/70 | HR 89 | Ht 62.0 in | Wt 152.2 lb

## 2023-04-03 DIAGNOSIS — J4540 Moderate persistent asthma, uncomplicated: Secondary | ICD-10-CM | POA: Diagnosis not present

## 2023-04-03 NOTE — Progress Notes (Signed)
   Subjective:    Patient ID: [name], female    DOB: [birth year], 73 y.o.   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  She is convinced that Singulair is causing reflux.  She tried stopping omeprazole but her reflux symptoms came back with a vengeance.      The patient, with a history of moderate persistent asthma and polio, presents for a follow-up visit. She reports an increase in her asthma symptoms, which she attributes to the current allergy season. She describes frequent throat clearing and believes the pollen, which has been present since the end of January, is exacerbating her condition. She also mentions a significant personal stressor, the recent death of her husband, which has been a challenging experience. However, she reports having a strong support system, including friends, family, and her choir group. She also finds comfort in her faith and music.  The patient's asthma management includes Symbicort, Singulair, and albuterol as a rescue inhaler. She reports needing to use her inhaler more frequently during the allergy season, but can manage with once-daily use during other times of the year. She also takes omeprazole for acid reflux, which she believes is exacerbated by the Singulair. She has recently added a medication for constipation to her regimen.     Significant tests/ events Spirometry 2010 normal CT sinuses 09/2013 normal  Review of Systems neg for any significant sore throat, dysphagia, itching, sneezing, nasal congestion or excess/ purulent secretions, fever, chills, sweats, unintended wt loss, pleuritic or exertional cp, hempoptysis, orthopnea pnd or change in chronic leg swelling. Also denies presyncope, palpitations,  heartburn, abdominal pain, nausea, vomiting, diarrhea or change in bowel or urinary habits, dysuria,hematuria, rash, arthralgias, visual complaints, headache, numbness weakness or ataxia.     Objective:   Physical Exam  Gen. Pleasant, well-nourished, in no distress ENT - no thrush, no pallor/icterus,no post nasal drip Neck: No JVD, no thyromegaly, no carotid bruits Lungs: no use of accessory muscles, no dullness to percussion, clear without rales or rhonchi  Cardiovascular: Rhythm regular, heart sounds  normal, no murmurs or gallops, no peripheral edema Musculoskeletal: No deformities, no cyanosis or clubbing        Assessment & Plan:   Moderate persistent asthma Moderate persistent asthma exacerbates during January and February, likely due to early pollen and mold exposure. Significant respiratory symptoms require increased use of albuterol and other asthma medications. Complicated by a history of polio affecting her respiratory system, reducing respiratory reserve. Reports frequent throat clearing due to increased mucus production. - Continue Symbicort inhaler twice daily during exacerbation periods - Use albuterol as a rescue inhaler as needed - Consider wearing a mask in public during high pollen and mold seasons  Gastroesophageal reflux disease (GERD) Chronic GERD, potentially exacerbated by Singulair. Omeprazole manages symptoms, but symptoms worsen at night, possibly due to Singulair timing. Adjusted Singulair to morning to minimize nocturnal reflux. - Continue omeprazole for GERD management - Continue taking Singulair in the morning to minimize reflux symptoms

## 2023-04-05 DIAGNOSIS — J3081 Allergic rhinitis due to animal (cat) (dog) hair and dander: Secondary | ICD-10-CM | POA: Diagnosis not present

## 2023-04-05 DIAGNOSIS — J3089 Other allergic rhinitis: Secondary | ICD-10-CM | POA: Diagnosis not present

## 2023-04-05 DIAGNOSIS — J301 Allergic rhinitis due to pollen: Secondary | ICD-10-CM | POA: Diagnosis not present

## 2023-04-13 ENCOUNTER — Other Ambulatory Visit: Payer: Self-pay | Admitting: Endocrinology

## 2023-04-13 DIAGNOSIS — E041 Nontoxic single thyroid nodule: Secondary | ICD-10-CM

## 2023-04-13 DIAGNOSIS — J3089 Other allergic rhinitis: Secondary | ICD-10-CM | POA: Diagnosis not present

## 2023-04-13 DIAGNOSIS — M810 Age-related osteoporosis without current pathological fracture: Secondary | ICD-10-CM

## 2023-04-13 DIAGNOSIS — J301 Allergic rhinitis due to pollen: Secondary | ICD-10-CM | POA: Diagnosis not present

## 2023-04-18 DIAGNOSIS — E010 Iodine-deficiency related diffuse (endemic) goiter: Secondary | ICD-10-CM | POA: Diagnosis not present

## 2023-04-18 DIAGNOSIS — M810 Age-related osteoporosis without current pathological fracture: Secondary | ICD-10-CM | POA: Diagnosis not present

## 2023-04-18 DIAGNOSIS — E559 Vitamin D deficiency, unspecified: Secondary | ICD-10-CM | POA: Diagnosis not present

## 2023-04-20 ENCOUNTER — Ambulatory Visit
Admission: RE | Admit: 2023-04-20 | Discharge: 2023-04-20 | Disposition: A | Discharge status: Home / Self Care | Source: Ambulatory Visit | Attending: Endocrinology | Admitting: Endocrinology

## 2023-04-20 DIAGNOSIS — J3081 Allergic rhinitis due to animal (cat) (dog) hair and dander: Secondary | ICD-10-CM | POA: Diagnosis not present

## 2023-04-20 DIAGNOSIS — Z9889 Other specified postprocedural states: Secondary | ICD-10-CM | POA: Diagnosis not present

## 2023-04-20 DIAGNOSIS — E041 Nontoxic single thyroid nodule: Secondary | ICD-10-CM | POA: Diagnosis not present

## 2023-04-20 DIAGNOSIS — J3089 Other allergic rhinitis: Secondary | ICD-10-CM | POA: Diagnosis not present

## 2023-04-20 DIAGNOSIS — J301 Allergic rhinitis due to pollen: Secondary | ICD-10-CM | POA: Diagnosis not present

## 2023-05-02 DIAGNOSIS — E559 Vitamin D deficiency, unspecified: Secondary | ICD-10-CM | POA: Diagnosis not present

## 2023-05-02 DIAGNOSIS — M810 Age-related osteoporosis without current pathological fracture: Secondary | ICD-10-CM | POA: Diagnosis not present

## 2023-05-02 DIAGNOSIS — I10 Essential (primary) hypertension: Secondary | ICD-10-CM | POA: Diagnosis not present

## 2023-05-02 DIAGNOSIS — E041 Nontoxic single thyroid nodule: Secondary | ICD-10-CM | POA: Diagnosis not present

## 2023-05-04 DIAGNOSIS — L814 Other melanin hyperpigmentation: Secondary | ICD-10-CM | POA: Diagnosis not present

## 2023-05-04 DIAGNOSIS — D2261 Melanocytic nevi of right upper limb, including shoulder: Secondary | ICD-10-CM | POA: Diagnosis not present

## 2023-05-04 DIAGNOSIS — D225 Melanocytic nevi of trunk: Secondary | ICD-10-CM | POA: Diagnosis not present

## 2023-05-04 DIAGNOSIS — Z85820 Personal history of malignant melanoma of skin: Secondary | ICD-10-CM | POA: Diagnosis not present

## 2023-05-04 DIAGNOSIS — D2262 Melanocytic nevi of left upper limb, including shoulder: Secondary | ICD-10-CM | POA: Diagnosis not present

## 2023-05-04 DIAGNOSIS — D2272 Melanocytic nevi of left lower limb, including hip: Secondary | ICD-10-CM | POA: Diagnosis not present

## 2023-05-04 DIAGNOSIS — L821 Other seborrheic keratosis: Secondary | ICD-10-CM | POA: Diagnosis not present

## 2023-05-04 DIAGNOSIS — L905 Scar conditions and fibrosis of skin: Secondary | ICD-10-CM | POA: Diagnosis not present

## 2023-05-04 DIAGNOSIS — D485 Neoplasm of uncertain behavior of skin: Secondary | ICD-10-CM | POA: Diagnosis not present

## 2023-05-04 DIAGNOSIS — D2271 Melanocytic nevi of right lower limb, including hip: Secondary | ICD-10-CM | POA: Diagnosis not present

## 2023-05-08 DIAGNOSIS — M810 Age-related osteoporosis without current pathological fracture: Secondary | ICD-10-CM | POA: Diagnosis not present

## 2023-05-09 DIAGNOSIS — J3089 Other allergic rhinitis: Secondary | ICD-10-CM | POA: Diagnosis not present

## 2023-05-09 DIAGNOSIS — J301 Allergic rhinitis due to pollen: Secondary | ICD-10-CM | POA: Diagnosis not present

## 2023-05-16 DIAGNOSIS — J301 Allergic rhinitis due to pollen: Secondary | ICD-10-CM | POA: Diagnosis not present

## 2023-05-16 DIAGNOSIS — J3089 Other allergic rhinitis: Secondary | ICD-10-CM | POA: Diagnosis not present

## 2023-05-16 DIAGNOSIS — J3081 Allergic rhinitis due to animal (cat) (dog) hair and dander: Secondary | ICD-10-CM | POA: Diagnosis not present

## 2023-05-17 ENCOUNTER — Other Ambulatory Visit (HOSPITAL_BASED_OUTPATIENT_CLINIC_OR_DEPARTMENT_OTHER): Payer: Self-pay | Admitting: Pulmonary Disease

## 2023-05-17 DIAGNOSIS — J301 Allergic rhinitis due to pollen: Secondary | ICD-10-CM | POA: Diagnosis not present

## 2023-05-17 DIAGNOSIS — J3081 Allergic rhinitis due to animal (cat) (dog) hair and dander: Secondary | ICD-10-CM | POA: Diagnosis not present

## 2023-05-23 DIAGNOSIS — J3089 Other allergic rhinitis: Secondary | ICD-10-CM | POA: Diagnosis not present

## 2023-05-23 DIAGNOSIS — J301 Allergic rhinitis due to pollen: Secondary | ICD-10-CM | POA: Diagnosis not present

## 2023-05-23 DIAGNOSIS — J3081 Allergic rhinitis due to animal (cat) (dog) hair and dander: Secondary | ICD-10-CM | POA: Diagnosis not present

## 2023-05-31 DIAGNOSIS — J301 Allergic rhinitis due to pollen: Secondary | ICD-10-CM | POA: Diagnosis not present

## 2023-05-31 DIAGNOSIS — J3089 Other allergic rhinitis: Secondary | ICD-10-CM | POA: Diagnosis not present

## 2023-05-31 DIAGNOSIS — J3081 Allergic rhinitis due to animal (cat) (dog) hair and dander: Secondary | ICD-10-CM | POA: Diagnosis not present

## 2023-06-09 DIAGNOSIS — J301 Allergic rhinitis due to pollen: Secondary | ICD-10-CM | POA: Diagnosis not present

## 2023-06-09 DIAGNOSIS — J3081 Allergic rhinitis due to animal (cat) (dog) hair and dander: Secondary | ICD-10-CM | POA: Diagnosis not present

## 2023-06-09 DIAGNOSIS — J3089 Other allergic rhinitis: Secondary | ICD-10-CM | POA: Diagnosis not present

## 2023-06-23 ENCOUNTER — Other Ambulatory Visit: Payer: Self-pay | Admitting: Gynecologic Oncology

## 2023-06-23 ENCOUNTER — Telehealth: Payer: Self-pay | Admitting: *Deleted

## 2023-06-23 DIAGNOSIS — G8929 Other chronic pain: Secondary | ICD-10-CM

## 2023-06-23 MED ORDER — ONDANSETRON HCL 4 MG PO TABS: 4.0000 mg | ORAL_TABLET | Freq: Once | ORAL | 0 refills | Status: AC | PRN

## 2023-06-23 MED ORDER — OXYCODONE HCL 5 MG PO TABS: 5.0000 mg | ORAL_TABLET | Freq: Once | ORAL | 0 refills | Status: AC

## 2023-06-23 NOTE — Telephone Encounter (Signed)
 Spoke with [name] to remind patient of her appointment with Dr. Orvil Bland next week and to go over using vaginal valium prior to her appointment. Pt states that provider discussed this option with her and patient refused and didn't want to hear about this again and is insisting on a pain pill before her pelvic exam. Advised patient that her message will be relayed to providers. And advised her she will need someone to bring her to and from her appt. Pt verbalized understanding.

## 2023-06-23 NOTE — Progress Notes (Signed)
 See RN note. Premeds sent in prior to pelvic exam per Dr. Orvil Bland.

## 2023-06-26 DIAGNOSIS — J3089 Other allergic rhinitis: Secondary | ICD-10-CM | POA: Diagnosis not present

## 2023-06-26 DIAGNOSIS — J3081 Allergic rhinitis due to animal (cat) (dog) hair and dander: Secondary | ICD-10-CM | POA: Diagnosis not present

## 2023-06-26 DIAGNOSIS — J301 Allergic rhinitis due to pollen: Secondary | ICD-10-CM | POA: Diagnosis not present

## 2023-06-29 ENCOUNTER — Inpatient Hospital Stay: Payer: Medicare Other | Attending: Gynecologic Oncology | Admitting: Gynecologic Oncology

## 2023-06-29 ENCOUNTER — Encounter: Payer: Self-pay | Admitting: Gynecologic Oncology

## 2023-06-29 VITALS — BP 135/79 | HR 88 | Temp 97.8°F | Resp 16 | Ht 62.0 in | Wt 148.0 lb

## 2023-06-29 DIAGNOSIS — C541 Malignant neoplasm of endometrium: Secondary | ICD-10-CM

## 2023-06-29 DIAGNOSIS — Z90722 Acquired absence of ovaries, bilateral: Secondary | ICD-10-CM | POA: Diagnosis not present

## 2023-06-29 DIAGNOSIS — K5900 Constipation, unspecified: Secondary | ICD-10-CM | POA: Diagnosis not present

## 2023-06-29 DIAGNOSIS — Z8542 Personal history of malignant neoplasm of other parts of uterus: Secondary | ICD-10-CM | POA: Diagnosis not present

## 2023-06-29 DIAGNOSIS — Z90710 Acquired absence of both cervix and uterus: Secondary | ICD-10-CM | POA: Insufficient documentation

## 2023-06-29 DIAGNOSIS — Z923 Personal history of irradiation: Secondary | ICD-10-CM | POA: Insufficient documentation

## 2023-06-29 DIAGNOSIS — Z9079 Acquired absence of other genital organ(s): Secondary | ICD-10-CM | POA: Insufficient documentation

## 2023-06-29 NOTE — Patient Instructions (Signed)
 It was good to see you today.  I do not see or feel any evidence of cancer recurrence on your exam.  I will see you for follow-up in 6 months.  As always, if you develop any new and concerning symptoms before your next visit, please call to see me sooner.

## 2023-06-29 NOTE — Progress Notes (Signed)
 Gynecologic Oncology Return Clinic Visit  06/29/23  Reason for Visit: Surveillance visit in the setting of high intermediate risk uterine cancer   Treatment History: Oncology History Overview Note  MSI-stable   Endometrial cancer (HCC)  02/27/2019 Initial Biopsy   Gr1 EMC on EMB   02/27/2019 Initial Diagnosis   Endometrial cancer (HCC)   03/05/2019 Imaging   Pelvic ultrasound: Uterus measures 7.9 x 5.7 x 3.7 cm with an endometrial lining of 2.9 cm.  Bilateral ovaries normal in size and shape.   03/20/2019 Surgery   Lsc LOA, TRH/BSO, SLN on R, left pelvic LND   03/20/2019 Cancer Staging   Staging form: Corpus Uteri - Carcinoma and Carcinosarcoma, AJCC 8th Edition - Clinical stage from 03/20/2019: FIGO Stage IB (cT1b, cN0, cM0) - Signed by Viktoria Comer SAUNDERS, MD on 03/27/2019   05/06/2019 - 06/04/2019 Radiation Therapy   Site Technique Total Dose (Gy) Dose per Fx (Gy) Completed Fx Beam Energies  Vagina: Pelvis HDR-brachy 30/30 6 5/5 Ir-192     03/09/2020 Genetic Testing   Negative genetic testing on the CancerNext-Expanded+RNAinsight panel.  The CancerNext-Expanded gene panel offered by [facility] and includes sequencing and rearrangement analysis for the following 77 genes: AIP, ALK, APC*, ATM*, AXIN2, BAP1, BARD1, BLM, BMPR1A, BRCA1*, BRCA2*, BRIP1*, CDC73, CDH1*, CDK4, CDKN1B, CDKN2A, CHEK2*, CTNNA1, DICER1, FANCC, FH, FLCN, GALNT12, KIF1B, LZTR1, MAX, MEN1, MET, MLH1*, MSH2*, MSH3, MSH6*, MUTYH*, NBN, NF1*, NF2, NTHL1, PALB2*, PHOX2B, PMS2*, POT1, PRKAR1A, PTCH1, PTEN*, RAD51C*, RAD51D*, RB1, RECQL, RET, SDHA, SDHAF2, SDHB, SDHC, SDHD, SMAD4, SMARCA4, SMARCB1, SMARCE1, STK11, SUFU, TMEM127, TP53*, TSC1, TSC2, VHL and XRCC2 (sequencing and deletion/duplication); EGFR, EGLN1, HOXB13, KIT, MITF, PDGFRA, POLD1, and POLE (sequencing only); EPCAM and GREM1 (deletion/duplication only). DNA and RNA analyses performed for * genes. The report date was March 09, 2020.     Interval History: Doing  well.  Denies any abdominal or pelvic pain.  Constipation has improved since starting Linzess.  Denies any urinary symptoms.  Her husband died in late 03-03-23 after his sixth stroke.  She is enjoying traveling and seeing friends and family.  Past Medical/Surgical History: Past Medical History:  Diagnosis Date   Allergic rhinitis    Arthritis    Asthma    Carpal tunnel syndrome on right    Endometrial cancer (HCC)    Family history of breast cancer    Family history of colon cancer    Family history of kidney cancer    Family history of stomach cancer    GERD (gastroesophageal reflux disease)    History of radiation therapy 06/04/2019   vaginal brachytherapy 05/06/2019-06/04/2019   Dr Lynwood Nasuti   Hyperlipemia    PMB (postmenopausal bleeding)    Polio    Left leg   Unspecified essential hypertension    clearance Dr Ala with note on chart   Uterine cancer (HCC) 03/2021    Past Surgical History:  Procedure Laterality Date   BREAST CYST ASPIRATION Right 2004   BREAST EXCISIONAL BIOPSY Bilateral    BREAST SURGERY     bilateral fibroid tumors removed   CARPAL TUNNEL RELEASE     right   CESAREAN SECTION     x 2   COLON SURGERY     scar tissue removed   COLONOSCOPY     IR RADIOLOGIST EVAL & MGMT  08/01/2022   IR RADIOLOGIST EVAL & MGMT  01/30/2023   leg surgery     bilateral, numerous   LYMPH NODE DISSECTION N/A 03/20/2019   Procedure:  LYMPH NODE DISSECTION;  Surgeon: Viktoria Comer SAUNDERS, MD;  Location: [facility];  Service: Gynecology;  Laterality: N/A;   ROBOTIC ASSISTED TOTAL HYSTERECTOMY WITH BILATERAL SALPINGO OOPHERECTOMY N/A 03/20/2019   Procedure: XI ROBOTIC ASSISTED TOTAL HYSTERECTOMY WITH BILATERAL SALPINGO OOPHORECTOMY;  Surgeon: Viktoria Comer SAUNDERS, MD;  Location: [facility];  Service: Gynecology;  Laterality: N/A;   SENTINEL NODE BIOPSY  03/20/2019   Procedure: SENTINEL NODE BIOPSY;  Surgeon: Viktoria Comer SAUNDERS, MD;  Location:  [facility];  Service: Gynecology;;   tooth implant     titanium/ front upper right   TOTAL HIP ARTHROPLASTY     right   TOTAL HIP REVISION  08/15/2011   Procedure: TOTAL HIP REVISION;  Surgeon: Donnice JONETTA Car, MD;  Location: [facility];  Service: Orthopedics;  Laterality: Right;   VAGINAL HYSTERECTOMY  03/2021   vocal surgery     cyst removed   WRIST SURGERY  2009   left    Family History  Problem Relation Age of Onset   Kidney cancer Father 48   CAD Father    Bladder Cancer Father 93       ureter   Melanoma Mother 3       STAGE 3   Breast cancer Mother 33   Heart attack Mother    Colon cancer Maternal Grandfather    Breast cancer Maternal Grandmother        meta to stomach   Breast cancer Maternal Aunt 78        bilateral   Stomach cancer Paternal Grandmother 67   Breast cancer Maternal Aunt 45   Non-Hodgkin's lymphoma Cousin    Hodgkin's lymphoma Son    Rectal cancer Neg Hx     Social History   Socioeconomic History   Marital status: Married    Spouse name: Not on file   Number of children: 2   Years of education: Not on file   Highest education level: Not on file  Occupational History   Occupation: Armed forces training and education officer    Comment: Tourist information centre manager: [employer]  Tobacco Use   Smoking status: Never   Smokeless tobacco: Never  Vaping Use   Vaping status: Never Used  Substance and Sexual Activity   Alcohol  use: Yes    Alcohol /week: 2.0 standard drinks of alcohol     Types: 1 Glasses of wine, 1 Cans of beer per week   Drug use: No   Sexual activity: Not Currently    Birth control/protection: Post-menopausal  Other Topics Concern   Not on file  Social History Narrative   Not on file   Social Drivers of Health   Financial Resource Strain: Not on file  Food Insecurity: Not on file  Transportation Needs: Not on file  Physical Activity: Not on file  Stress: Not on file  Social Connections: Not on file    Current  Medications:  Current Outpatient Medications:    albuterol  (PROVENTIL ) (2.5 MG/3ML) 0.083% nebulizer solution, Take 3 mLs (2.5 mg total) by nebulization every 6 (six) hours as needed for wheezing or shortness of breath., Disp: 75 vial, Rfl: 5   albuterol  (VENTOLIN  HFA) 108 (90 Base) MCG/ACT inhaler, Inhale 2 puffs into the lungs every 6 (six) hours as needed for wheezing or shortness of breath., Disp: , Rfl:    amLODipine (NORVASC) 2.5 MG tablet, Take 2.5 mg by mouth daily., Disp: , Rfl:    ascorbic acid (VITAMIN C) 500 MG tablet,  Take 500 mg by mouth daily., Disp: , Rfl:    Ca Phosphate-Cholecalciferol (CALCIUM/VITAMIN D3 GUMMIES) 250-350 MG-UNIT CHEW, Chew 3 tablets by mouth daily., Disp: , Rfl:    cholecalciferol (VITAMIN D3) 25 MCG (1000 UNIT) tablet, Take by mouth once., Disp: , Rfl:    Cyanocobalamin  (VITAMIN B-12 CR) 1500 MCG TBCR, Take 1,000 mcg by mouth daily. , Disp: , Rfl:    EPINEPHrine 0.3 mg/0.3 mL IJ SOAJ injection, , Disp: , Rfl:    fluticasone -salmeterol (WIXELA INHUB) 100-50 MCG/ACT AEPB, Inhale 1 puff into the lungs 2 (two) times daily., Disp: 60 each, Rfl: 1   linaclotide (LINZESS) 72 MCG capsule, Take 72 mcg by mouth daily as needed (constipation)., Disp: , Rfl:    loratadine  (CLARITIN ) 10 MG tablet, Take 10 mg by mouth daily., Disp: , Rfl:    losartan-hydrochlorothiazide  (HYZAAR) 100-25 MG per tablet, Take 1 tablet by mouth daily., Disp: , Rfl:    montelukast  (SINGULAIR ) 10 MG tablet, Take 1 tablet (10 mg total) by mouth at bedtime., Disp: 30 tablet, Rfl: 3   Multiple Vitamin (MULTIVITAMIN WITH MINERALS) TABS tablet, Take 1 tablet by mouth daily., Disp: , Rfl:    omeprazole  (PRILOSEC) 20 MG capsule, Take 1 capsule (20 mg total) by mouth daily., Disp: 30 capsule, Rfl: 5   ondansetron  (ZOFRAN ) 4 MG tablet, Take 1 tablet (4 mg total) by mouth once as needed for up to 1 dose for nausea or vomiting. Can take if become nauseated after pain medication, Disp: 1 tablet, Rfl: 0   OVER  THE COUNTER MEDICATION, Take 0.5 drops by mouth at bedtime. CBD Oil, 1/2 drop under the tongue nightly, Disp: , Rfl:    oxyCODONE  (OXY IR/ROXICODONE ) 5 MG immediate release tablet, Take 1 tablet (5 mg total) by mouth once for 1 dose. Take one hour before visit. Do not take and drive, Disp: 1 tablet, Rfl: 0   SYMBICORT  80-4.5 MCG/ACT inhaler, Inhale 1 puff into the lungs in the morning and at bedtime., Disp: 10.2 g, Rfl: 4  Review of Systems: Denies appetite changes, fevers, chills, fatigue, unexplained weight changes. Denies hearing loss, neck lumps or masses, mouth sores, ringing in ears or voice changes. Denies cough or wheezing.  Denies shortness of breath. Denies chest pain or palpitations. Denies leg swelling. Denies abdominal distention, pain, blood in stools, constipation, diarrhea, nausea, vomiting, or early satiety. Denies pain with intercourse, dysuria, frequency, hematuria or incontinence. Denies hot flashes, pelvic pain, vaginal bleeding or vaginal discharge.   Denies joint pain, back pain or muscle pain/cramps. Denies itching, rash, or wounds. Denies dizziness, headaches, numbness or seizures. Denies swollen lymph nodes or glands, denies easy bruising or bleeding. Denies anxiety, depression, confusion, or decreased concentration.  Physical Exam: BP 135/79 (BP Location: Left Arm, Patient Position: Sitting)   Pulse 88   Temp 97.8 F (36.6 C) (Oral)   Resp 16   Ht 5' 2 (1.575 m)   Wt 148 lb (67.1 kg)   SpO2 99%   BMI 27.07 kg/m  General: Alert, oriented, no acute distress. HEENT: Normocephalic, atraumatic, sclera anicteric. Chest: Clear to auscultation bilaterally.  No inspiratory or expiratory wheezing appreciated. Cardiovascular: Regular rate and rhythm, no murmurs. Abdomen: soft, nontender.  Normoactive bowel sounds.  No masses or hepatosplenomegaly appreciated.  Well-healed incisions. Extremities: Grossly normal range of motion although somewhat decreased mobility of  her left lower extremity, at baseline.  Warm, well perfused.  No edema bilaterally. Skin: No rashes or lesions noted. Lymphatics: No cervical, supraclavicular, or  inguinal adenopathy. GU: Normal appearing external genitalia without erythema, excoriation, or lesions.  Speculum exam reveals moderately atrophic vaginal mucosa consistent with radiation changes, no lesions or masses.  Small speculum used.  Bimanual exam reveals no nodularity.  Rectovaginal exam confirms his exam findings.   Laboratory & Radiologic Studies: None new  Assessment & Plan: [name] is a 73 y.o. 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 Finished adjuvant VBT in 06/2019. MS-stable. Negative genetic testing.   Patient is doing well and is NED on exam today.  There is no evidence of agglutination on exam.  Per her request today, we will try the exam with vaginal Valium at her next visit. Patient continues to tolerate the exam very well with premedication; could consider vaginal Valium in the future for exams.   Per NCCN surveillance recommendations, we will continue with visits every 6 months.  She prefers to have this with me only.  We discussed signs and symptoms that would be concerning for disease recurrence, and the patient knows to call if she develops any of these sooner than her next scheduled visit.    22 minutes of total time was spent for this patient encounter, including preparation, face-to-face counseling with the patient and coordination of care, and documentation of the encounter.  Comer Dollar, MD  Division of Gynecologic Oncology  Department of Obstetrics and Gynecology  [facility]

## 2023-07-03 DIAGNOSIS — J3089 Other allergic rhinitis: Secondary | ICD-10-CM | POA: Diagnosis not present

## 2023-07-03 DIAGNOSIS — J301 Allergic rhinitis due to pollen: Secondary | ICD-10-CM | POA: Diagnosis not present

## 2023-07-03 DIAGNOSIS — J3081 Allergic rhinitis due to animal (cat) (dog) hair and dander: Secondary | ICD-10-CM | POA: Diagnosis not present

## 2023-07-10 DIAGNOSIS — R030 Elevated blood-pressure reading, without diagnosis of hypertension: Secondary | ICD-10-CM | POA: Diagnosis not present

## 2023-07-10 DIAGNOSIS — J449 Chronic obstructive pulmonary disease, unspecified: Secondary | ICD-10-CM | POA: Diagnosis not present

## 2023-07-10 DIAGNOSIS — R058 Other specified cough: Secondary | ICD-10-CM | POA: Diagnosis not present

## 2023-07-10 DIAGNOSIS — Z91048 Other nonmedicinal substance allergy status: Secondary | ICD-10-CM | POA: Diagnosis not present

## 2023-07-16 ENCOUNTER — Other Ambulatory Visit: Payer: Self-pay | Admitting: Pulmonary Disease

## 2023-07-17 DIAGNOSIS — J301 Allergic rhinitis due to pollen: Secondary | ICD-10-CM | POA: Diagnosis not present

## 2023-07-17 DIAGNOSIS — J4530 Mild persistent asthma, uncomplicated: Secondary | ICD-10-CM | POA: Diagnosis not present

## 2023-07-17 DIAGNOSIS — J209 Acute bronchitis, unspecified: Secondary | ICD-10-CM | POA: Diagnosis not present

## 2023-07-17 DIAGNOSIS — J3089 Other allergic rhinitis: Secondary | ICD-10-CM | POA: Diagnosis not present

## 2023-07-28 IMAGING — MG MM DIGITAL SCREENING BILAT W/ TOMO AND CAD
8 series · 8 of 24 positions shown · non-contrast
Comparison: Previous exam(s).

CLINICAL DATA: Screening.

EXAM:
DIGITAL SCREENING BILATERAL MAMMOGRAM WITH TOMOSYNTHESIS AND CAD
TECHNIQUE: Bilateral screening digital craniocaudal and mediolateral oblique
mammograms were obtained. Bilateral screening digital breast
tomosynthesis was performed. The images were evaluated with
computer-aided detection.

[R CC synth-2D]
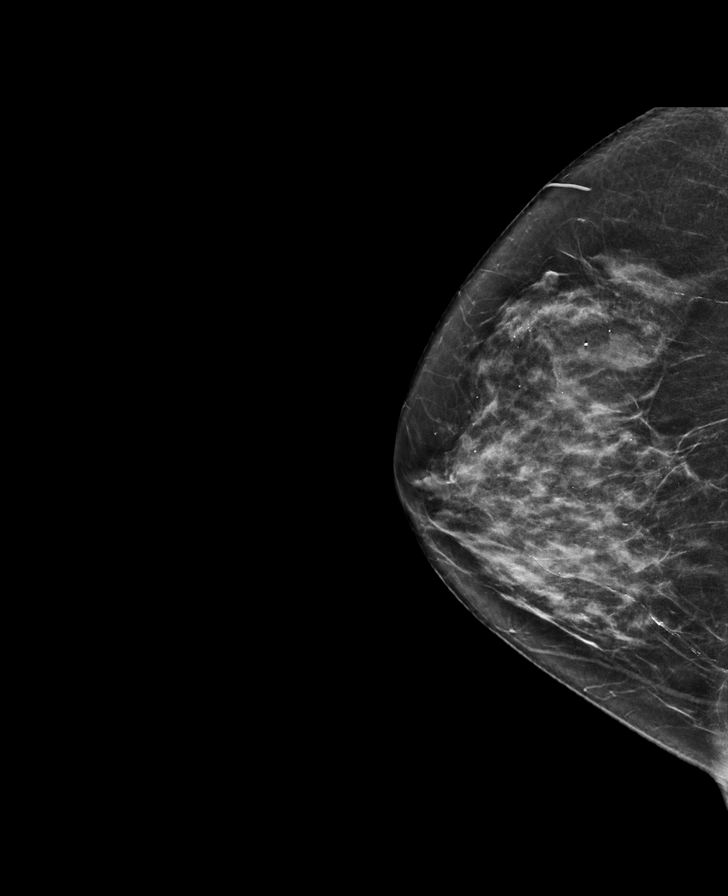

[L MLO synth-2D]
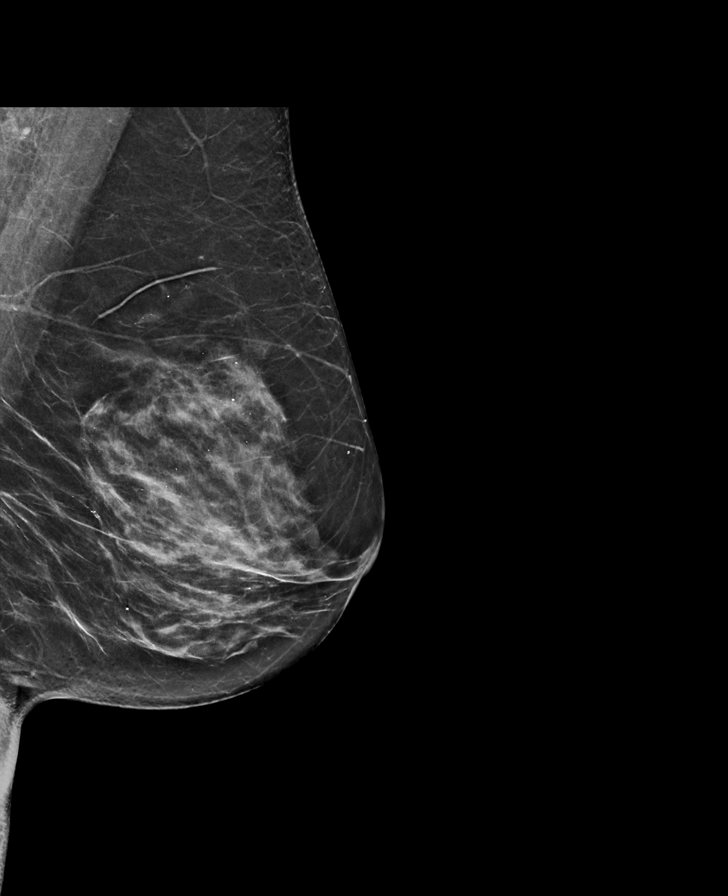

[L CC synth-2D]
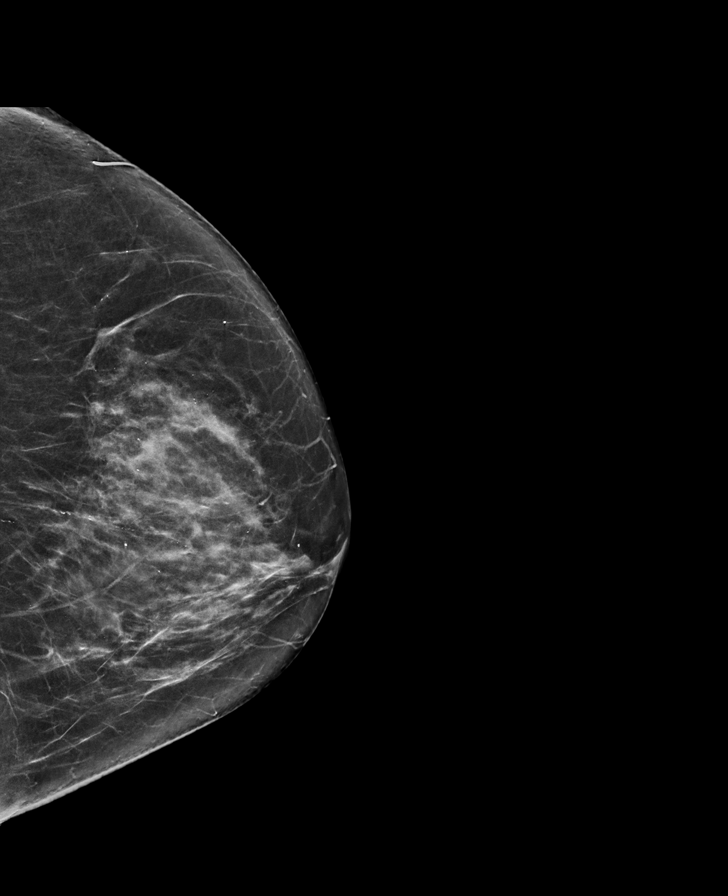

[R MLO synth-2D]
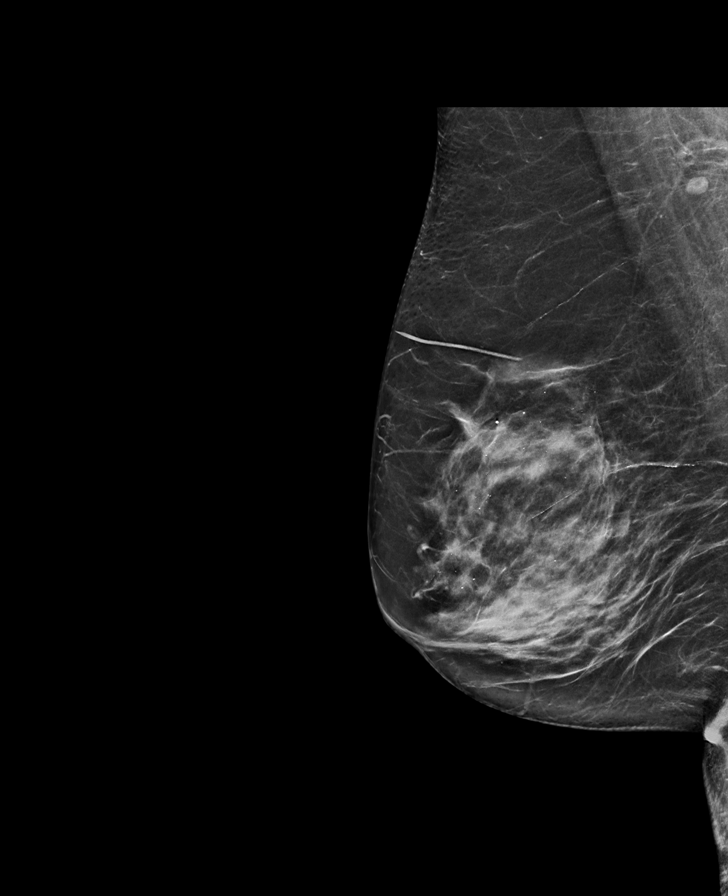

[R CC tomo · tomo slice 34/67.0]
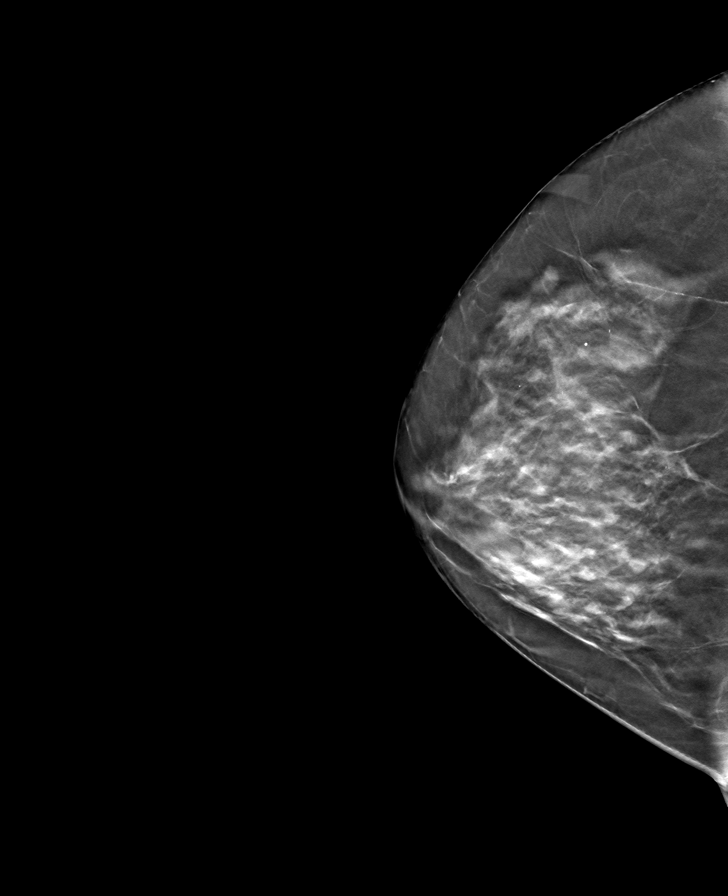

[R MLO tomo · tomo slice 36/71.0]
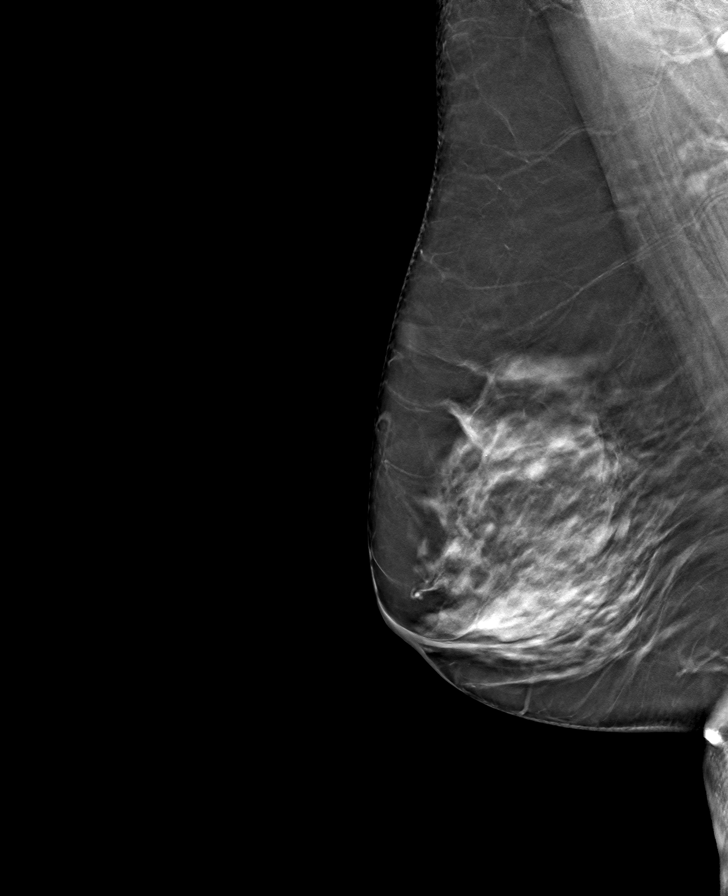

[L MLO tomo · tomo slice 35/68.0]
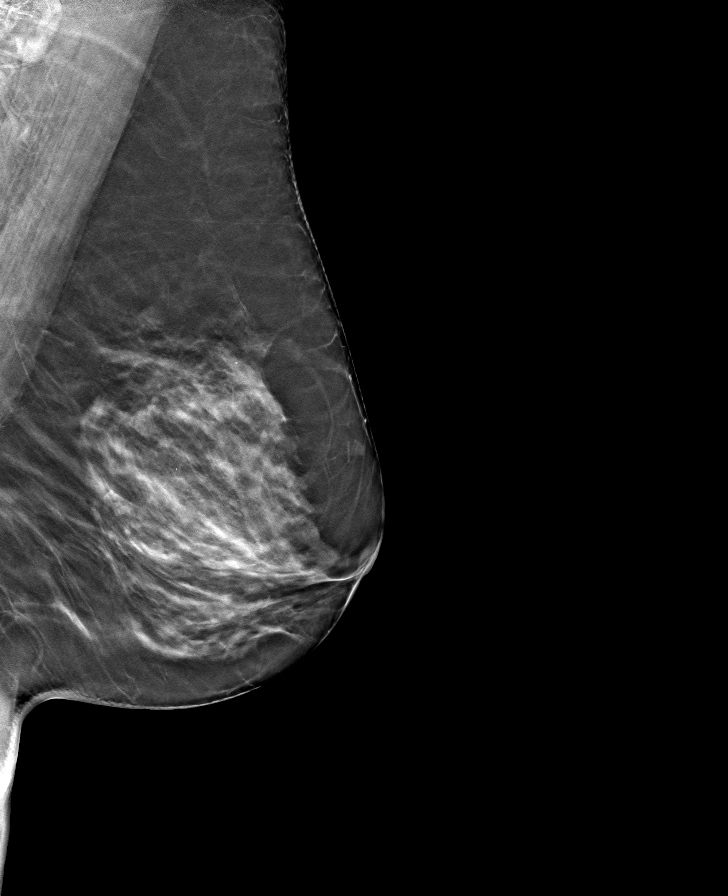

[L CC tomo · tomo slice 33/66.0]
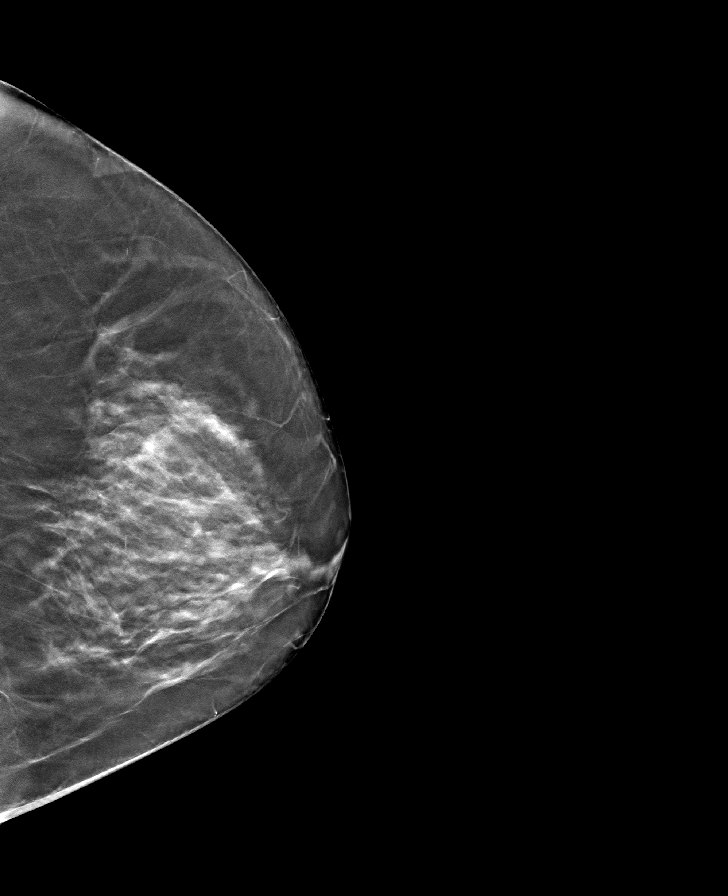

[8 of 24 positions shown; findings below may reference images not displayed]

ACR Breast Density Category c: The breast tissue is heterogeneously
dense, which may obscure small masses.
FINDINGS: There are no findings suspicious for malignancy.
IMPRESSION: No mammographic evidence of malignancy. A result letter of this
screening mammogram will be mailed directly to the patient.

RECOMMENDATION:
Screening mammogram in one year. (Code:Q3-W-BC3)

BI-RADS CATEGORY  1: Negative.

## 2023-07-31 DIAGNOSIS — J301 Allergic rhinitis due to pollen: Secondary | ICD-10-CM | POA: Diagnosis not present

## 2023-07-31 DIAGNOSIS — J3081 Allergic rhinitis due to animal (cat) (dog) hair and dander: Secondary | ICD-10-CM | POA: Diagnosis not present

## 2023-07-31 DIAGNOSIS — J3089 Other allergic rhinitis: Secondary | ICD-10-CM | POA: Diagnosis not present

## 2023-08-16 DIAGNOSIS — J3089 Other allergic rhinitis: Secondary | ICD-10-CM | POA: Diagnosis not present

## 2023-08-16 DIAGNOSIS — J301 Allergic rhinitis due to pollen: Secondary | ICD-10-CM | POA: Diagnosis not present

## 2023-08-16 DIAGNOSIS — J3081 Allergic rhinitis due to animal (cat) (dog) hair and dander: Secondary | ICD-10-CM | POA: Diagnosis not present

## 2023-08-23 DIAGNOSIS — J301 Allergic rhinitis due to pollen: Secondary | ICD-10-CM | POA: Diagnosis not present

## 2023-08-23 DIAGNOSIS — J3081 Allergic rhinitis due to animal (cat) (dog) hair and dander: Secondary | ICD-10-CM | POA: Diagnosis not present

## 2023-08-23 DIAGNOSIS — J3089 Other allergic rhinitis: Secondary | ICD-10-CM | POA: Diagnosis not present

## 2023-08-29 DIAGNOSIS — J3089 Other allergic rhinitis: Secondary | ICD-10-CM | POA: Diagnosis not present

## 2023-08-29 DIAGNOSIS — J301 Allergic rhinitis due to pollen: Secondary | ICD-10-CM | POA: Diagnosis not present

## 2023-08-29 DIAGNOSIS — J3081 Allergic rhinitis due to animal (cat) (dog) hair and dander: Secondary | ICD-10-CM | POA: Diagnosis not present

## 2023-09-05 DIAGNOSIS — J3089 Other allergic rhinitis: Secondary | ICD-10-CM | POA: Diagnosis not present

## 2023-09-05 DIAGNOSIS — J301 Allergic rhinitis due to pollen: Secondary | ICD-10-CM | POA: Diagnosis not present

## 2023-09-18 DIAGNOSIS — J3089 Other allergic rhinitis: Secondary | ICD-10-CM | POA: Diagnosis not present

## 2023-09-18 DIAGNOSIS — J301 Allergic rhinitis due to pollen: Secondary | ICD-10-CM | POA: Diagnosis not present

## 2023-09-18 DIAGNOSIS — J3081 Allergic rhinitis due to animal (cat) (dog) hair and dander: Secondary | ICD-10-CM | POA: Diagnosis not present

## 2023-09-25 DIAGNOSIS — J301 Allergic rhinitis due to pollen: Secondary | ICD-10-CM | POA: Diagnosis not present

## 2023-09-25 DIAGNOSIS — R29898 Other symptoms and signs involving the musculoskeletal system: Secondary | ICD-10-CM | POA: Diagnosis not present

## 2023-09-25 DIAGNOSIS — J3089 Other allergic rhinitis: Secondary | ICD-10-CM | POA: Diagnosis not present

## 2023-09-25 DIAGNOSIS — Z8612 Personal history of poliomyelitis: Secondary | ICD-10-CM | POA: Diagnosis not present

## 2023-09-25 DIAGNOSIS — J3081 Allergic rhinitis due to animal (cat) (dog) hair and dander: Secondary | ICD-10-CM | POA: Diagnosis not present

## 2023-09-25 DIAGNOSIS — M2170 Unequal limb length (acquired), unspecified site: Secondary | ICD-10-CM | POA: Diagnosis not present

## 2023-10-03 DIAGNOSIS — J301 Allergic rhinitis due to pollen: Secondary | ICD-10-CM | POA: Diagnosis not present

## 2023-10-03 DIAGNOSIS — J3089 Other allergic rhinitis: Secondary | ICD-10-CM | POA: Diagnosis not present

## 2023-10-03 DIAGNOSIS — J3081 Allergic rhinitis due to animal (cat) (dog) hair and dander: Secondary | ICD-10-CM | POA: Diagnosis not present

## 2023-10-09 DIAGNOSIS — J301 Allergic rhinitis due to pollen: Secondary | ICD-10-CM | POA: Diagnosis not present

## 2023-10-09 DIAGNOSIS — J3081 Allergic rhinitis due to animal (cat) (dog) hair and dander: Secondary | ICD-10-CM | POA: Diagnosis not present

## 2023-10-09 DIAGNOSIS — J3089 Other allergic rhinitis: Secondary | ICD-10-CM | POA: Diagnosis not present

## 2023-10-13 DIAGNOSIS — E7849 Other hyperlipidemia: Secondary | ICD-10-CM | POA: Diagnosis not present

## 2023-10-13 DIAGNOSIS — M8580 Other specified disorders of bone density and structure, unspecified site: Secondary | ICD-10-CM | POA: Diagnosis not present

## 2023-10-13 DIAGNOSIS — E785 Hyperlipidemia, unspecified: Secondary | ICD-10-CM | POA: Diagnosis not present

## 2023-10-13 DIAGNOSIS — E041 Nontoxic single thyroid nodule: Secondary | ICD-10-CM | POA: Diagnosis not present

## 2023-10-13 DIAGNOSIS — E538 Deficiency of other specified B group vitamins: Secondary | ICD-10-CM | POA: Diagnosis not present

## 2023-10-13 DIAGNOSIS — I10 Essential (primary) hypertension: Secondary | ICD-10-CM | POA: Diagnosis not present

## 2023-10-13 DIAGNOSIS — K219 Gastro-esophageal reflux disease without esophagitis: Secondary | ICD-10-CM | POA: Diagnosis not present

## 2023-10-16 DIAGNOSIS — J3081 Allergic rhinitis due to animal (cat) (dog) hair and dander: Secondary | ICD-10-CM | POA: Diagnosis not present

## 2023-10-16 DIAGNOSIS — J301 Allergic rhinitis due to pollen: Secondary | ICD-10-CM | POA: Diagnosis not present

## 2023-10-16 DIAGNOSIS — J3089 Other allergic rhinitis: Secondary | ICD-10-CM | POA: Diagnosis not present

## 2023-10-20 DIAGNOSIS — R82998 Other abnormal findings in urine: Secondary | ICD-10-CM | POA: Diagnosis not present

## 2023-10-20 DIAGNOSIS — J45909 Unspecified asthma, uncomplicated: Secondary | ICD-10-CM | POA: Diagnosis not present

## 2023-10-20 DIAGNOSIS — J309 Allergic rhinitis, unspecified: Secondary | ICD-10-CM | POA: Diagnosis not present

## 2023-10-20 DIAGNOSIS — G14 Postpolio syndrome: Secondary | ICD-10-CM | POA: Diagnosis not present

## 2023-10-20 DIAGNOSIS — M8580 Other specified disorders of bone density and structure, unspecified site: Secondary | ICD-10-CM | POA: Diagnosis not present

## 2023-10-20 DIAGNOSIS — Z1331 Encounter for screening for depression: Secondary | ICD-10-CM | POA: Diagnosis not present

## 2023-10-20 DIAGNOSIS — Z0000 Encounter for general adult medical examination without abnormal findings: Secondary | ICD-10-CM | POA: Diagnosis not present

## 2023-10-20 DIAGNOSIS — E785 Hyperlipidemia, unspecified: Secondary | ICD-10-CM | POA: Diagnosis not present

## 2023-10-20 DIAGNOSIS — M1990 Unspecified osteoarthritis, unspecified site: Secondary | ICD-10-CM | POA: Diagnosis not present

## 2023-10-20 DIAGNOSIS — I10 Essential (primary) hypertension: Secondary | ICD-10-CM | POA: Diagnosis not present

## 2023-10-20 DIAGNOSIS — Z23 Encounter for immunization: Secondary | ICD-10-CM | POA: Diagnosis not present

## 2023-10-20 DIAGNOSIS — E538 Deficiency of other specified B group vitamins: Secondary | ICD-10-CM | POA: Diagnosis not present

## 2023-10-20 DIAGNOSIS — R269 Unspecified abnormalities of gait and mobility: Secondary | ICD-10-CM | POA: Diagnosis not present

## 2023-10-20 DIAGNOSIS — R1013 Epigastric pain: Secondary | ICD-10-CM | POA: Diagnosis not present

## 2023-10-20 DIAGNOSIS — K219 Gastro-esophageal reflux disease without esophagitis: Secondary | ICD-10-CM | POA: Diagnosis not present

## 2023-10-24 DIAGNOSIS — J301 Allergic rhinitis due to pollen: Secondary | ICD-10-CM | POA: Diagnosis not present

## 2023-10-24 DIAGNOSIS — J3089 Other allergic rhinitis: Secondary | ICD-10-CM | POA: Diagnosis not present

## 2023-11-03 DIAGNOSIS — M810 Age-related osteoporosis without current pathological fracture: Secondary | ICD-10-CM | POA: Diagnosis not present

## 2023-11-06 DIAGNOSIS — L821 Other seborrheic keratosis: Secondary | ICD-10-CM | POA: Diagnosis not present

## 2023-11-06 DIAGNOSIS — J3081 Allergic rhinitis due to animal (cat) (dog) hair and dander: Secondary | ICD-10-CM | POA: Diagnosis not present

## 2023-11-06 DIAGNOSIS — D485 Neoplasm of uncertain behavior of skin: Secondary | ICD-10-CM | POA: Diagnosis not present

## 2023-11-06 DIAGNOSIS — Z85820 Personal history of malignant melanoma of skin: Secondary | ICD-10-CM | POA: Diagnosis not present

## 2023-11-06 DIAGNOSIS — D2272 Melanocytic nevi of left lower limb, including hip: Secondary | ICD-10-CM | POA: Diagnosis not present

## 2023-11-06 DIAGNOSIS — J301 Allergic rhinitis due to pollen: Secondary | ICD-10-CM | POA: Diagnosis not present

## 2023-11-06 DIAGNOSIS — D2261 Melanocytic nevi of right upper limb, including shoulder: Secondary | ICD-10-CM | POA: Diagnosis not present

## 2023-11-06 DIAGNOSIS — L820 Inflamed seborrheic keratosis: Secondary | ICD-10-CM | POA: Diagnosis not present

## 2023-11-06 DIAGNOSIS — D225 Melanocytic nevi of trunk: Secondary | ICD-10-CM | POA: Diagnosis not present

## 2023-11-06 DIAGNOSIS — J3089 Other allergic rhinitis: Secondary | ICD-10-CM | POA: Diagnosis not present

## 2023-11-06 DIAGNOSIS — L905 Scar conditions and fibrosis of skin: Secondary | ICD-10-CM | POA: Diagnosis not present

## 2023-11-06 DIAGNOSIS — D2262 Melanocytic nevi of left upper limb, including shoulder: Secondary | ICD-10-CM | POA: Diagnosis not present

## 2023-11-06 DIAGNOSIS — D2271 Melanocytic nevi of right lower limb, including hip: Secondary | ICD-10-CM | POA: Diagnosis not present

## 2023-11-10 DIAGNOSIS — M810 Age-related osteoporosis without current pathological fracture: Secondary | ICD-10-CM | POA: Diagnosis not present

## 2023-11-15 DIAGNOSIS — H40013 Open angle with borderline findings, low risk, bilateral: Secondary | ICD-10-CM | POA: Diagnosis not present

## 2023-11-15 DIAGNOSIS — H25813 Combined forms of age-related cataract, bilateral: Secondary | ICD-10-CM | POA: Diagnosis not present

## 2023-11-15 DIAGNOSIS — H40033 Anatomical narrow angle, bilateral: Secondary | ICD-10-CM | POA: Diagnosis not present

## 2023-11-15 DIAGNOSIS — H02883 Meibomian gland dysfunction of right eye, unspecified eyelid: Secondary | ICD-10-CM | POA: Diagnosis not present

## 2023-11-21 DIAGNOSIS — J3081 Allergic rhinitis due to animal (cat) (dog) hair and dander: Secondary | ICD-10-CM | POA: Diagnosis not present

## 2023-11-21 DIAGNOSIS — J301 Allergic rhinitis due to pollen: Secondary | ICD-10-CM | POA: Diagnosis not present

## 2023-11-21 DIAGNOSIS — J3089 Other allergic rhinitis: Secondary | ICD-10-CM | POA: Diagnosis not present

## 2023-11-28 DIAGNOSIS — J301 Allergic rhinitis due to pollen: Secondary | ICD-10-CM | POA: Diagnosis not present

## 2023-11-28 DIAGNOSIS — J3081 Allergic rhinitis due to animal (cat) (dog) hair and dander: Secondary | ICD-10-CM | POA: Diagnosis not present

## 2023-11-28 DIAGNOSIS — J3089 Other allergic rhinitis: Secondary | ICD-10-CM | POA: Diagnosis not present

## 2023-12-06 DIAGNOSIS — J3081 Allergic rhinitis due to animal (cat) (dog) hair and dander: Secondary | ICD-10-CM | POA: Diagnosis not present

## 2023-12-06 DIAGNOSIS — J3089 Other allergic rhinitis: Secondary | ICD-10-CM | POA: Diagnosis not present

## 2023-12-06 DIAGNOSIS — J301 Allergic rhinitis due to pollen: Secondary | ICD-10-CM | POA: Diagnosis not present

## 2023-12-14 ENCOUNTER — Other Ambulatory Visit (HOSPITAL_BASED_OUTPATIENT_CLINIC_OR_DEPARTMENT_OTHER): Payer: Self-pay

## 2023-12-14 DIAGNOSIS — J301 Allergic rhinitis due to pollen: Secondary | ICD-10-CM | POA: Diagnosis not present

## 2023-12-14 DIAGNOSIS — J3089 Other allergic rhinitis: Secondary | ICD-10-CM | POA: Diagnosis not present

## 2023-12-14 MED ORDER — MONTELUKAST SODIUM 10 MG PO TABS: 10.0000 mg | ORAL_TABLET | Freq: Every day | ORAL | 3 refills | Status: AC

## 2023-12-19 ENCOUNTER — Other Ambulatory Visit

## 2024-01-02 ENCOUNTER — Other Ambulatory Visit: Payer: Self-pay | Admitting: Gynecologic Oncology

## 2024-01-02 DIAGNOSIS — G8929 Other chronic pain: Secondary | ICD-10-CM

## 2024-01-02 MED ORDER — DIAZEPAM 10 MG PO TABS: ORAL_TABLET | ORAL | 0 refills | Status: AC

## 2024-01-05 ENCOUNTER — Inpatient Hospital Stay: Admitting: Gynecologic Oncology

## 2024-01-05 ENCOUNTER — Encounter: Payer: Self-pay | Admitting: Gynecologic Oncology

## 2024-01-05 VITALS — BP 139/82 | HR 78 | Temp 98.1°F | Resp 18 | Wt 149.0 lb

## 2024-01-05 DIAGNOSIS — Z9079 Acquired absence of other genital organ(s): Secondary | ICD-10-CM | POA: Insufficient documentation

## 2024-01-05 DIAGNOSIS — Z08 Encounter for follow-up examination after completed treatment for malignant neoplasm: Secondary | ICD-10-CM | POA: Diagnosis not present

## 2024-01-05 DIAGNOSIS — Z90710 Acquired absence of both cervix and uterus: Secondary | ICD-10-CM | POA: Diagnosis not present

## 2024-01-05 DIAGNOSIS — Z90722 Acquired absence of ovaries, bilateral: Secondary | ICD-10-CM | POA: Diagnosis not present

## 2024-01-05 DIAGNOSIS — Z8542 Personal history of malignant neoplasm of other parts of uterus: Secondary | ICD-10-CM | POA: Diagnosis not present

## 2024-01-05 DIAGNOSIS — Z923 Personal history of irradiation: Secondary | ICD-10-CM | POA: Insufficient documentation

## 2024-01-05 DIAGNOSIS — C541 Malignant neoplasm of endometrium: Secondary | ICD-10-CM

## 2024-01-05 NOTE — Patient Instructions (Signed)
 It was good to see you today.  I do not see or feel any evidence of cancer recurrence on your exam.  I will see you for follow-up in 6 months.  As always, if you develop any new and concerning symptoms before your next visit, please call to see me sooner.

## 2024-01-05 NOTE — Progress Notes (Signed)
 Gynecologic Oncology Return Clinic Visit  01/05/2024  Reason for Visit: Surveillance visit in the setting of high intermediate risk uterine cancer    Treatment History: Oncology History Overview Note  MSI-stable   Endometrial cancer (HCC)  02/27/2019 Initial Biopsy   Gr1 EMC on EMB   02/27/2019 Initial Diagnosis   Endometrial cancer (HCC)   03/05/2019 Imaging   Pelvic ultrasound: Uterus measures 7.9 x 5.7 x 3.7 cm with an endometrial lining of 2.9 cm.  Bilateral ovaries normal in size and shape.   03/20/2019 Surgery   Lsc LOA, TRH/BSO, SLN on R, left pelvic LND   03/20/2019 Cancer Staging   Staging form: Corpus Uteri - Carcinoma and Carcinosarcoma, AJCC 8th Edition - Clinical stage from 03/20/2019: FIGO Stage IB (cT1b, cN0, cM0) - Signed by Viktoria Comer SAUNDERS, MD on 03/27/2019   05/06/2019 - 06/04/2019 Radiation Therapy   Site Technique Total Dose (Gy) Dose per Fx (Gy) Completed Fx Beam Energies  Vagina: Pelvis HDR-brachy 30/30 6 5/5 Ir-192     03/09/2020 Genetic Testing   Negative genetic testing on the CancerNext-Expanded+RNAinsight panel.  The CancerNext-Expanded gene panel offered by [facility] and includes sequencing and rearrangement analysis for the following 77 genes: AIP, ALK, APC*, ATM*, AXIN2, BAP1, BARD1, BLM, BMPR1A, BRCA1*, BRCA2*, BRIP1*, CDC73, CDH1*, CDK4, CDKN1B, CDKN2A, CHEK2*, CTNNA1, DICER1, FANCC, FH, FLCN, GALNT12, KIF1B, LZTR1, MAX, MEN1, MET, MLH1*, MSH2*, MSH3, MSH6*, MUTYH*, NBN, NF1*, NF2, NTHL1, PALB2*, PHOX2B, PMS2*, POT1, PRKAR1A, PTCH1, PTEN*, RAD51C*, RAD51D*, RB1, RECQL, RET, SDHA, SDHAF2, SDHB, SDHC, SDHD, SMAD4, SMARCA4, SMARCB1, SMARCE1, STK11, SUFU, TMEM127, TP53*, TSC1, TSC2, VHL and XRCC2 (sequencing and deletion/duplication); EGFR, EGLN1, HOXB13, KIT, MITF, PDGFRA, POLD1, and POLE (sequencing only); EPCAM and GREM1 (deletion/duplication only). DNA and RNA analyses performed for * genes. The report date was March 09, 2020.     Interval History: Doing  well.  Denies any vaginal bleeding or discharge.  Denies any pelvic pain.  Constipation has resolved with Linzess.  Past Medical/Surgical History: Past Medical History:  Diagnosis Date   Allergic rhinitis    Arthritis    Asthma    Carpal tunnel syndrome on right    Endometrial cancer (HCC)    Family history of breast cancer    Family history of colon cancer    Family history of kidney cancer    Family history of stomach cancer    GERD (gastroesophageal reflux disease)    History of radiation therapy 06/04/2019   vaginal brachytherapy 05/06/2019-06/04/2019   Dr Lynwood Nasuti   Hyperlipemia    PMB (postmenopausal bleeding)    Polio    Left leg   Unspecified essential hypertension    clearance Dr Ala with note on chart   Uterine cancer (HCC) 03/2021    Past Surgical History:  Procedure Laterality Date   BREAST CYST ASPIRATION Right 2004   BREAST EXCISIONAL BIOPSY Bilateral    BREAST SURGERY     bilateral fibroid tumors removed   CARPAL TUNNEL RELEASE     right   CESAREAN SECTION     x 2   COLON SURGERY     scar tissue removed   COLONOSCOPY     IR RADIOLOGIST EVAL & MGMT  08/01/2022   IR RADIOLOGIST EVAL & MGMT  01/30/2023   leg surgery     bilateral, numerous   LYMPH NODE DISSECTION N/A 03/20/2019   Procedure: LYMPH NODE DISSECTION;  Surgeon: Viktoria Comer SAUNDERS, MD;  Location: [facility];  Service: Gynecology;  Laterality: N/A;  ROBOTIC ASSISTED TOTAL HYSTERECTOMY WITH BILATERAL SALPINGO OOPHERECTOMY N/A 03/20/2019   Procedure: XI ROBOTIC ASSISTED TOTAL HYSTERECTOMY WITH BILATERAL SALPINGO OOPHORECTOMY;  Surgeon: Viktoria Comer SAUNDERS, MD;  Location: [facility];  Service: Gynecology;  Laterality: N/A;   SENTINEL NODE BIOPSY  03/20/2019   Procedure: SENTINEL NODE BIOPSY;  Surgeon: Viktoria Comer SAUNDERS, MD;  Location: [facility];  Service: Gynecology;;   tooth implant     titanium/ front upper right   TOTAL HIP ARTHROPLASTY      right   TOTAL HIP REVISION  08/15/2011   Procedure: TOTAL HIP REVISION;  Surgeon: Donnice JONETTA Car, MD;  Location: [facility];  Service: Orthopedics;  Laterality: Right;   VAGINAL HYSTERECTOMY  03/2021   vocal surgery     cyst removed   WRIST SURGERY  2009   left    Family History  Problem Relation Age of Onset   Kidney cancer Father 11   CAD Father    Bladder Cancer Father 28       ureter   Melanoma Mother 69       STAGE 3   Breast cancer Mother 15   Heart attack Mother    Colon cancer Maternal Grandfather    Breast cancer Maternal Grandmother        meta to stomach   Breast cancer Maternal Aunt 78        bilateral   Stomach cancer Paternal Grandmother 24   Breast cancer Maternal Aunt 45   Non-Hodgkin's lymphoma Cousin    Hodgkin's lymphoma Son    Rectal cancer Neg Hx     Social History   Socioeconomic History   Marital status: Married    Spouse name: Not on file   Number of children: 2   Years of education: Not on file   Highest education level: Not on file  Occupational History   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: [employer]  Tobacco Use   Smoking status: Never   Smokeless tobacco: Never  Vaping Use   Vaping status: Never Used  Substance and Sexual Activity   Alcohol  use: Yes    Alcohol /week: 2.0 standard drinks of alcohol     Types: 1 Glasses of wine, 1 Cans of beer per week   Drug use: No   Sexual activity: Not Currently    Birth control/protection: Post-menopausal  Other Topics Concern   Not on file  Social History Narrative   Not on file   Social Drivers of Health   Tobacco Use: Low Risk (01/05/2024)   Patient History    Smoking Tobacco Use: Never    Smokeless Tobacco Use: Never    Passive Exposure: Not on file  Financial Resource Strain: Not on file  Food Insecurity: Not on file  Transportation Needs: Not on file  Physical Activity: Not on file  Stress: Not on file  Social Connections: Not on  file  Depression (EYV7-0): Not on file  Alcohol  Screen: Not on file  Housing: Not on file  Utilities: Not on file  Health Literacy: Not on file    Current Medications: Current Medications[1]  Review of Systems: + tinnitus Denies appetite changes, fevers, chills, fatigue, unexplained weight changes. Denies hearing loss, neck lumps or masses, mouth sores or voice changes. Denies cough or wheezing.  Denies shortness of breath. Denies chest pain or palpitations. Denies leg swelling. Denies abdominal distention, pain, blood in stools, constipation, diarrhea, nausea, vomiting, or early satiety.  Denies pain with intercourse, dysuria, frequency, hematuria or incontinence. Denies hot flashes, pelvic pain, vaginal bleeding or vaginal discharge.   Denies joint pain, back pain or muscle pain/cramps. Denies itching, rash, or wounds. Denies dizziness, headaches, numbness or seizures. Denies swollen lymph nodes or glands, denies easy bruising or bleeding. Denies anxiety, depression, confusion, or decreased concentration.  Physical Exam: BP 139/82 (BP Location: Right Arm, Patient Position: Sitting)   Pulse 78   Temp 98.1 F (36.7 C) (Oral)   Resp 18   Wt 149 lb (67.6 kg)   SpO2 98%   BMI 27.25 kg/m  General: Alert, oriented, no acute distress. HEENT: Normocephalic, atraumatic, sclera anicteric. Chest: Clear to auscultation bilaterally.  No inspiratory or expiratory wheezing appreciated. Cardiovascular: Regular rate and rhythm, no murmurs. Abdomen: soft, nontender.  Normoactive bowel sounds.  No masses or hepatosplenomegaly appreciated.  Well-healed incisions. Extremities: Grossly normal range of motion although somewhat decreased mobility of her left lower extremity, at baseline.  Warm, well perfused.  No edema bilaterally. Skin: No rashes or lesions noted. Lymphatics: No cervical, supraclavicular, or inguinal adenopathy. GU: Normal appearing external genitalia without erythema,  excoriation, or lesions.  Speculum exam reveals moderately atrophic vaginal mucosa consistent with radiation changes, no lesions or masses.  Vaginal Valium tab mostly whole in the upper vagina and removed with a speculum exam.  Bimanual exam reveals no nodularity.   Laboratory & Radiologic Studies: None new  Assessment & Plan: [name] is a 74 y.o. 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 Finished adjuvant VBT in 06/2019. MS-stable. Negative genetic testing.   Patient is doing well and is NED on exam today.  There is no evidence of agglutination on exam.  Tolerated speculum exam with vaginal Valium without any issues.   Per NCCN surveillance recommendations, we will continue with visits every 6 months.  She prefers to have this with me only.  At her next visit, she will be 5 years out from completion of adjuvant treatment.  At that time, we will transition to yearly visits with an OB/GYN.  We discussed signs and symptoms that would be concerning for disease recurrence, and the patient knows to call if she develops any of these sooner than her next scheduled visit.   20 minutes of total time was spent for this patient encounter, including preparation, face-to-face counseling with the patient and coordination of care, and documentation of the encounter.  Comer Dollar, MD  Division of Gynecologic Oncology  Department of Obstetrics and Gynecology  [facility]      [1]  Current Outpatient Medications:    albuterol  (PROVENTIL ) (2.5 MG/3ML) 0.083% nebulizer solution, Take 3 mLs (2.5 mg total) by nebulization every 6 (six) hours as needed for wheezing or shortness of breath., Disp: 75 vial, Rfl: 5   albuterol  (VENTOLIN  HFA) 108 (90 Base) MCG/ACT inhaler, Inhale 2 puffs into the lungs every 6 (six) hours as needed for wheezing or shortness of breath., Disp: , Rfl:    amLODipine  (NORVASC) 2.5 MG tablet, Take 2.5 mg by mouth daily., Disp: , Rfl:    ascorbic acid (VITAMIN C) 500 MG tablet, Take 500 mg by mouth daily., Disp: , Rfl:    Ca Phosphate-Cholecalciferol (CALCIUM/VITAMIN D3 GUMMIES) 250-350 MG-UNIT CHEW, Chew 3 tablets by mouth daily., Disp: , Rfl:    cholecalciferol (VITAMIN D3) 25 MCG (1000 UNIT) tablet, Take by mouth once., Disp: , Rfl:  Cyanocobalamin  (VITAMIN B-12 CR) 1500 MCG TBCR, Take 1,000 mcg by mouth daily. , Disp: , Rfl:    dextromethorphan-guaiFENesin (MUCINEX DM) 30-600 MG 12hr tablet, Take 1 tablet by mouth 2 (two) times daily., Disp: , Rfl:    EPINEPHrine 0.3 mg/0.3 mL IJ SOAJ injection, , Disp: , Rfl:    fluticasone -salmeterol (WIXELA INHUB) 100-50 MCG/ACT AEPB, Inhale 1 puff into the lungs 2 (two) times daily., Disp: 60 each, Rfl: 1   linaclotide (LINZESS) 72 MCG capsule, Take 72 mcg by mouth daily as needed (constipation)., Disp: , Rfl:    loratadine  (CLARITIN ) 10 MG tablet, Take 10 mg by mouth daily., Disp: , Rfl:    losartan-hydrochlorothiazide  (HYZAAR) 100-25 MG per tablet, Take 1 tablet by mouth daily., Disp: , Rfl:    montelukast  (SINGULAIR ) 10 MG tablet, Take 1 tablet (10 mg total) by mouth at bedtime., Disp: 30 tablet, Rfl: 3   Multiple Vitamin (MULTIVITAMIN WITH MINERALS) TABS tablet, Take 1 tablet by mouth daily., Disp: , Rfl:    omeprazole  (PRILOSEC) 20 MG capsule, Take 1 capsule (20 mg total) by mouth daily., Disp: 30 capsule, Rfl: 5   ondansetron  (ZOFRAN ) 4 MG tablet, Take 1 tablet (4 mg total) by mouth once as needed for up to 1 dose for nausea or vomiting. Can take if become nauseated after pain medication, Disp: 1 tablet, Rfl: 0   OVER THE COUNTER MEDICATION, Take 0.5 drops by mouth at bedtime. CBD Oil, 1/2 drop under the tongue nightly, Disp: , Rfl:    triamcinolone (NASACORT ALLERGY 24HR) 55 MCG/ACT AERO nasal inhaler, 1 spray in each nostril Nasally Once a day, Disp: , Rfl:    diazepam (VALIUM) 10 MG tablet, Place one tablet in  the vagina one hour before your examination. Use caution, may cause drowsiness. Have a driver for your visit., Disp: 1 tablet, Rfl: 0

## 2024-01-12 ENCOUNTER — Other Ambulatory Visit: Payer: Self-pay | Admitting: Internal Medicine

## 2024-01-12 DIAGNOSIS — Z1231 Encounter for screening mammogram for malignant neoplasm of breast: Secondary | ICD-10-CM

## 2024-01-15 ENCOUNTER — Other Ambulatory Visit: Payer: Self-pay | Admitting: Pulmonary Disease

## 2024-01-17 ENCOUNTER — Ambulatory Visit (HOSPITAL_BASED_OUTPATIENT_CLINIC_OR_DEPARTMENT_OTHER)
Admission: RE | Admit: 2024-01-17 | Discharge: 2024-01-17 | Disposition: A | Discharge status: Home / Self Care | Source: Ambulatory Visit | Attending: Endocrinology | Admitting: Endocrinology

## 2024-01-17 DIAGNOSIS — M810 Age-related osteoporosis without current pathological fracture: Secondary | ICD-10-CM | POA: Insufficient documentation

## 2024-02-26 ENCOUNTER — Ambulatory Visit

## 2024-07-19 ENCOUNTER — Inpatient Hospital Stay: Admitting: Gynecologic Oncology
